# Patient Record
Sex: Male | Born: 1985
Health system: Southern US, Community
[De-identification: ages and names within clinical notes are randomized; demographics above are authoritative.]

## PROBLEM LIST (undated history)

## (undated) ENCOUNTER — Ambulatory Visit (HOSPITAL_COMMUNITY): Admission: EM | Payer: Medicare Other | Source: Home / Self Care

## (undated) DIAGNOSIS — R51 Headache: Secondary | ICD-10-CM

## (undated) DIAGNOSIS — D649 Anemia, unspecified: Secondary | ICD-10-CM

## (undated) DIAGNOSIS — I10 Essential (primary) hypertension: Secondary | ICD-10-CM

## (undated) DIAGNOSIS — N185 Chronic kidney disease, stage 5: Secondary | ICD-10-CM

## (undated) DIAGNOSIS — E875 Hyperkalemia: Secondary | ICD-10-CM

## (undated) DIAGNOSIS — Z992 Dependence on renal dialysis: Secondary | ICD-10-CM

## (undated) DIAGNOSIS — B019 Varicella without complication: Secondary | ICD-10-CM

## (undated) HISTORY — DX: Essential (primary) hypertension: I10

## (undated) HISTORY — DX: Headache: R51

## (undated) HISTORY — PX: PERITONEAL CATHETER INSERTION: SHX2223

## (undated) HISTORY — DX: Varicella without complication: B01.9

## (undated) HISTORY — DX: Anemia, unspecified: D64.9

## (undated) HISTORY — PX: DIALYSIS FISTULA CREATION: SHX611

## (undated) HISTORY — PX: KIDNEY TRANSPLANT: SHX239

---

## 2010-12-23 ENCOUNTER — Ambulatory Visit: Payer: Self-pay | Admitting: Family Medicine

## 2010-12-30 ENCOUNTER — Ambulatory Visit (INDEPENDENT_AMBULATORY_CARE_PROVIDER_SITE_OTHER): Payer: BC Managed Care – PPO | Admitting: Family Medicine

## 2010-12-30 ENCOUNTER — Encounter: Payer: Self-pay | Admitting: Family Medicine

## 2010-12-30 VITALS — BP 150/100 | HR 80 | Temp 98.4°F | Resp 12 | Ht 71.75 in | Wt 257.0 lb

## 2010-12-30 DIAGNOSIS — R319 Hematuria, unspecified: Secondary | ICD-10-CM

## 2010-12-30 DIAGNOSIS — G43909 Migraine, unspecified, not intractable, without status migrainosus: Secondary | ICD-10-CM

## 2010-12-30 DIAGNOSIS — Z0000 Encounter for general adult medical examination without abnormal findings: Secondary | ICD-10-CM

## 2010-12-30 DIAGNOSIS — Z23 Encounter for immunization: Secondary | ICD-10-CM

## 2010-12-30 DIAGNOSIS — I10 Essential (primary) hypertension: Secondary | ICD-10-CM | POA: Insufficient documentation

## 2010-12-30 DIAGNOSIS — Z299 Encounter for prophylactic measures, unspecified: Secondary | ICD-10-CM

## 2010-12-30 LAB — CBC WITH DIFFERENTIAL/PLATELET
Basophils Relative: 0.6 % (ref 0.0–3.0)
Eosinophils Absolute: 0.4 10*3/uL (ref 0.0–0.7)
Eosinophils Relative: 4.1 % (ref 0.0–5.0)
Hemoglobin: 15.4 g/dL (ref 13.0–17.0)
Lymphocytes Relative: 29.1 % (ref 12.0–46.0)
MCHC: 32.8 g/dL (ref 30.0–36.0)
MCV: 105 fl — ABNORMAL HIGH (ref 78.0–100.0)
Monocytes Absolute: 0.5 10*3/uL (ref 0.1–1.0)
Neutro Abs: 6.1 10*3/uL (ref 1.4–7.7)
Neutrophils Relative %: 61.2 % (ref 43.0–77.0)
RBC: 4.48 Mil/uL (ref 4.22–5.81)
WBC: 9.9 10*3/uL (ref 4.5–10.5)

## 2010-12-30 LAB — HEPATIC FUNCTION PANEL
ALT: 27 U/L (ref 0–53)
AST: 27 U/L (ref 0–37)
Bilirubin, Direct: 0 mg/dL (ref 0.0–0.3)
Total Bilirubin: 0.7 mg/dL (ref 0.3–1.2)
Total Protein: 7.5 g/dL (ref 6.0–8.3)

## 2010-12-30 LAB — POCT URINALYSIS DIPSTICK
Ketones, UA: NEGATIVE
Spec Grav, UA: 1.025
Urobilinogen, UA: 0.2
pH, UA: 6

## 2010-12-30 LAB — BASIC METABOLIC PANEL
BUN: 32 mg/dL — ABNORMAL HIGH (ref 6–23)
CO2: 25 mEq/L (ref 19–32)
Calcium: 9.3 mg/dL (ref 8.4–10.5)
GFR: 45.45 mL/min — ABNORMAL LOW (ref >60.00)
Glucose, Bld: 99 mg/dL (ref 70–99)

## 2010-12-30 LAB — LIPID PANEL: HDL: 43.3 mg/dL (ref >39.00)

## 2010-12-30 MED ORDER — LISINOPRIL-HYDROCHLOROTHIAZIDE 20-12.5 MG PO TABS: 1.0000 | ORAL_TABLET | Freq: Every day | ORAL | Status: DC

## 2010-12-30 NOTE — Patient Instructions (Signed)
Work on consistent aerobic exercise and weight loss Schedule followup in one month to reassess blood pressure

## 2010-12-31 ENCOUNTER — Encounter: Payer: Self-pay | Admitting: Family Medicine

## 2010-12-31 LAB — LDL CHOLESTEROL, DIRECT: Direct LDL: 174.3 mg/dL

## 2010-12-31 NOTE — Progress Notes (Signed)
  Subjective:    Patient ID: Cory Dixon, male    DOB: 08/05/1985, 25 y.o.   MRN: [id number]  HPI New patient to establish care for CPE.  PMH significant for elevated blood pressure currently not treated.  Has history of migraine headaches and takes OTC meds.  These occur somewhat infrequently-about once per month.  Ongoing nicotine use.  Has recently moved here from [place].  On no meds.  PMH as below.  Tdap 2009.  No flu vaccine yet.   Works as Firefighter.  Exercises couple times per week.  No recent labs.  Very strong FH hypertension.  Past Medical History  Diagnosis Date  . Chicken pox   . Headache   . Hypertension   . Migraine    History reviewed. No pertinent past surgical history.  reports that he has been smoking Cigarettes.  He has a 1.6 pack-year smoking history. He does not have any smokeless tobacco history on file. His alcohol and drug histories not on file. family history includes Arthritis in his maternal grandmother and mother; Cancer in his maternal grandmother; Depression in his maternal grandmother and paternal grandmother; and Hypertension in his maternal grandfather, maternal grandmother, paternal grandfather, and paternal grandmother. No Known Allergies    Review of Systems  Constitutional: Negative for fever, activity change, appetite change, fatigue and unexpected weight change.  HENT: Negative for hearing loss, sore throat, neck pain and voice change.   Eyes: Negative for visual disturbance.  Respiratory: Negative for cough and shortness of breath.   Cardiovascular: Negative for chest pain and palpitations.  Gastrointestinal: Negative for nausea, vomiting, abdominal pain, diarrhea, blood in stool and abdominal distention.  Genitourinary: Negative for dysuria and testicular pain.  Musculoskeletal: Negative for back pain.  Neurological: Negative for dizziness, seizures and syncope.  Hematological: Negative for adenopathy. Does not bruise/bleed  easily.  Psychiatric/Behavioral: Negative for dysphoric mood.       Objective:   Physical Exam  Constitutional: He is oriented to person, place, and time. He appears well-developed and well-nourished. No distress.  HENT:  Right Ear: External ear normal.  Left Ear: External ear normal.  Mouth/Throat: Oropharynx is clear and moist.  Eyes: Pupils are equal, round, and reactive to light.  Neck: Neck supple. No thyromegaly present.  Cardiovascular: Normal rate and regular rhythm.  Exam reveals no gallop.   Pulmonary/Chest: Effort normal and breath sounds normal. No respiratory distress. He has no wheezes. He has no rales.  Abdominal: Soft. Bowel sounds are normal. He exhibits no distension and no mass. There is no tenderness. There is no rebound and no guarding.  Genitourinary:       Testes normal.  No hernia.  Musculoskeletal: He exhibits no edema.  Lymphadenopathy:    He has no cervical adenopathy.  Neurological: He is alert and oriented to person, place, and time. No cranial nerve deficit.  Skin: No rash noted. No erythema.  Psychiatric: He has a normal mood and affect. His behavior is normal.          Assessment & Plan:  #1  Health Maintenance-  Flu vaccine given.  Needs to lose some weight.  Baseline labs. #2  Hypertension.  Currently untreated.  Lisinopril HCTZ 20/12.5 mg one daily.

## 2011-01-01 NOTE — Progress Notes (Signed)
Quick Note:  LMTCB to review labs and directions ______

## 2011-01-02 NOTE — Progress Notes (Signed)
Quick Note:  Pt informed, he will schedule return OV in 2 weeks ______

## 2011-01-08 ENCOUNTER — Telehealth: Payer: Self-pay | Admitting: *Deleted

## 2011-01-08 ENCOUNTER — Encounter: Payer: BC Managed Care – PPO | Admitting: Family Medicine

## 2011-01-08 NOTE — Telephone Encounter (Signed)
Pt no show for 1 month follow-up visit.  I called pt, left a message re no show and our no show policy.  Requested he call back to explain

## 2011-01-08 NOTE — Progress Notes (Signed)
No show for 1 month follow-up, VM left for pt to return call.

## 2011-01-22 ENCOUNTER — Ambulatory Visit: Payer: BC Managed Care – PPO | Admitting: Family Medicine

## 2011-05-29 ENCOUNTER — Telehealth: Payer: Self-pay | Admitting: Family Medicine

## 2011-05-29 DIAGNOSIS — I10 Essential (primary) hypertension: Secondary | ICD-10-CM

## 2011-05-29 MED ORDER — LISINOPRIL-HYDROCHLOROTHIAZIDE 20-12.5 MG PO TABS: 1.0000 | ORAL_TABLET | Freq: Every day | ORAL | Status: DC

## 2011-05-29 NOTE — Telephone Encounter (Signed)
Pt called and now lives in [place]. Pt will be getting a new pcp, but will need to get a new script for lisinopril-hydrochlorothiazide (PRINZIDE,ZESTORETIC) 20-12.5 MG per tablet called in to [facility] in [place], [place]. Pt only has one pill left.

## 2011-11-30 NOTE — Progress Notes (Signed)
This encounter was created in error - please disregard.

## 2016-12-29 DIAGNOSIS — R0789 Other chest pain: Secondary | ICD-10-CM | POA: Diagnosis not present

## 2016-12-29 DIAGNOSIS — M545 Low back pain: Secondary | ICD-10-CM | POA: Diagnosis not present

## 2016-12-29 DIAGNOSIS — R51 Headache: Secondary | ICD-10-CM | POA: Diagnosis not present

## 2016-12-29 DIAGNOSIS — Z792 Long term (current) use of antibiotics: Secondary | ICD-10-CM | POA: Diagnosis not present

## 2016-12-29 DIAGNOSIS — Z992 Dependence on renal dialysis: Secondary | ICD-10-CM | POA: Diagnosis not present

## 2016-12-29 DIAGNOSIS — Z79899 Other long term (current) drug therapy: Secondary | ICD-10-CM | POA: Diagnosis not present

## 2016-12-29 DIAGNOSIS — Z79811 Long term (current) use of aromatase inhibitors: Secondary | ICD-10-CM | POA: Diagnosis not present

## 2016-12-29 DIAGNOSIS — N186 End stage renal disease: Secondary | ICD-10-CM | POA: Diagnosis not present

## 2016-12-29 DIAGNOSIS — Z888 Allergy status to other drugs, medicaments and biological substances status: Secondary | ICD-10-CM | POA: Diagnosis not present

## 2016-12-29 DIAGNOSIS — Z9889 Other specified postprocedural states: Secondary | ICD-10-CM | POA: Diagnosis not present

## 2016-12-29 DIAGNOSIS — I120 Hypertensive chronic kidney disease with stage 5 chronic kidney disease or end stage renal disease: Secondary | ICD-10-CM | POA: Diagnosis not present

## 2017-03-07 DIAGNOSIS — N2581 Secondary hyperparathyroidism of renal origin: Secondary | ICD-10-CM | POA: Diagnosis not present

## 2017-03-07 DIAGNOSIS — N186 End stage renal disease: Secondary | ICD-10-CM | POA: Diagnosis not present

## 2017-03-07 DIAGNOSIS — N250 Renal osteodystrophy: Secondary | ICD-10-CM | POA: Diagnosis not present

## 2017-03-07 DIAGNOSIS — E875 Hyperkalemia: Secondary | ICD-10-CM

## 2017-03-07 HISTORY — DX: Hyperkalemia: E87.5

## 2017-03-11 ENCOUNTER — Encounter (HOSPITAL_COMMUNITY): Payer: Self-pay | Admitting: Emergency Medicine

## 2017-03-11 ENCOUNTER — Other Ambulatory Visit: Payer: Self-pay

## 2017-03-11 ENCOUNTER — Emergency Department (HOSPITAL_COMMUNITY)
Admission: EM | Admit: 2017-03-11 | Discharge: 2017-03-11 | Disposition: A | Discharge status: Home / Self Care | Payer: Medicare Other | Attending: Emergency Medicine | Admitting: Emergency Medicine

## 2017-03-11 DIAGNOSIS — N186 End stage renal disease: Secondary | ICD-10-CM

## 2017-03-11 DIAGNOSIS — Z992 Dependence on renal dialysis: Secondary | ICD-10-CM | POA: Insufficient documentation

## 2017-03-11 DIAGNOSIS — Z79899 Other long term (current) drug therapy: Secondary | ICD-10-CM | POA: Diagnosis not present

## 2017-03-11 DIAGNOSIS — I120 Hypertensive chronic kidney disease with stage 5 chronic kidney disease or end stage renal disease: Secondary | ICD-10-CM | POA: Insufficient documentation

## 2017-03-11 DIAGNOSIS — F17210 Nicotine dependence, cigarettes, uncomplicated: Secondary | ICD-10-CM | POA: Insufficient documentation

## 2017-03-11 LAB — COMPREHENSIVE METABOLIC PANEL
ALT: 14 U/L — ABNORMAL LOW (ref 17–63)
ANION GAP: 14 (ref 5–15)
AST: 16 U/L (ref 15–41)
Albumin: 3.9 g/dL (ref 3.5–5.0)
Alkaline Phosphatase: 68 U/L (ref 38–126)
BILIRUBIN TOTAL: 1 mg/dL (ref 0.3–1.2)
BUN: 59 mg/dL — AB (ref 6–20)
CO2: 26 mmol/L (ref 22–32)
Calcium: 9.6 mg/dL (ref 8.9–10.3)
Chloride: 98 mmol/L — ABNORMAL LOW (ref 101–111)
Creatinine, Ser: 11.88 mg/dL — ABNORMAL HIGH (ref 0.61–1.24)
GFR calc Af Amer: 6 mL/min — ABNORMAL LOW (ref >60)
GFR, EST NON AFRICAN AMERICAN: 5 mL/min — AB (ref >60)
Glucose, Bld: 95 mg/dL (ref 65–99)
POTASSIUM: 4.6 mmol/L (ref 3.5–5.1)
Sodium: 138 mmol/L (ref 135–145)
TOTAL PROTEIN: 7.5 g/dL (ref 6.5–8.1)

## 2017-03-11 LAB — CBC WITH DIFFERENTIAL/PLATELET
BASOS ABS: 0.1 10*3/uL (ref 0.0–0.1)
BASOS PCT: 1 %
EOS PCT: 3 %
Eosinophils Absolute: 0.3 10*3/uL (ref 0.0–0.7)
HCT: 37.6 % — ABNORMAL LOW (ref 39.0–52.0)
Hemoglobin: 12.7 g/dL — ABNORMAL LOW (ref 13.0–17.0)
LYMPHS PCT: 35 %
Lymphs Abs: 3.5 10*3/uL (ref 0.7–4.0)
MCH: 36.1 pg — ABNORMAL HIGH (ref 26.0–34.0)
MCHC: 33.8 g/dL (ref 30.0–36.0)
MCV: 106.8 fL — AB (ref 78.0–100.0)
Monocytes Absolute: 0.4 10*3/uL (ref 0.1–1.0)
Monocytes Relative: 4 %
NEUTROS ABS: 5.8 10*3/uL (ref 1.7–7.7)
Neutrophils Relative %: 57 %
Platelets: 236 10*3/uL (ref 150–400)
RBC: 3.52 MIL/uL — AB (ref 4.22–5.81)
RDW: 13.6 % (ref 11.5–15.5)
WBC: 10.1 10*3/uL (ref 4.0–10.5)

## 2017-03-11 NOTE — ED Notes (Signed)
Pt verbalized understanding of discharge instructions. Unable to sign. Hall bed.

## 2017-03-11 NOTE — ED Provider Notes (Signed)
[facility] EMERGENCY DEPARTMENT Provider Note   CSN: [id number] Arrival date & time: 03/11/17  0745  History   Chief Complaint No chief complaint on file.   HPI Cory Dixon is a 31 y.o. male with a PMHx of HTN, A-fib (one episode in 2015, sinus since), and ESRD on HD TRS who presented to the ED for HD. He recently moved from [place] (Dr. Thera Flake) and his last HD was on 12/01. He has been on HD for approximately 1 year and has a fistula at his left anterior cubital fossa. He is unsure of the cause of his kidney failure and has never had a kidney biopsy. He still produces a good amount of urine and is in the process of getting outpatient HD set up but he is unsure which center. He currently is denying symptoms of volume overload or uremia.   Past Medical History:  Diagnosis Date  . Chicken pox   . Headache(784.0)   . Hypertension   . Migraine    Patient Active Problem List   Diagnosis Date Noted  . Hypertension 12/30/2010  . Migraine headache 12/30/2010   History reviewed. No pertinent surgical history.  Home Medications    Prior to Admission medications   Medication Sig Start Date End Date Taking? Authorizing Provider  cinacalcet (SENSIPAR) 60 MG tablet Take 60 mg by mouth daily.   Yes [provider]  escitalopram (LEXAPRO) 5 MG tablet Take 5 mg by mouth at bedtime.   Yes [provider]  lisinopril (PRINIVIL,ZESTRIL) 10 MG tablet Take 10 mg by mouth daily.   Yes [provider]  nicotine (NICODERM CQ - DOSED IN MG/24 HOURS) 14 mg/24hr patch Place 14 mg onto the skin daily as needed (nicotine).   Yes [provider]  sucroferric oxyhydroxide (VELPHORO) 500 MG chewable tablet Chew 1,000 mg by mouth 3 (three) times daily.   Yes [provider]  tamsulosin (FLOMAX) 0.4 MG CAPS capsule Take 0.4 mg by mouth daily.   Yes [provider]  lisinopril-hydrochlorothiazide (PRINZIDE,ZESTORETIC) 20-12.5 MG per tablet  Take 1 tablet by mouth daily. Patient not taking: Reported on 03/11/2017 05/29/11 05/28/12  [name], MD   Family History Family History  Problem Relation Age of Onset  . Arthritis Mother   . Arthritis Maternal Grandmother   . Cancer Maternal Grandmother        breast  . Hypertension Maternal Grandmother   . Depression Maternal Grandmother   . Hypertension Maternal Grandfather   . Hypertension Paternal Grandmother   . Depression Paternal Grandmother   . Hypertension Paternal Grandfather    Social History Social History   Tobacco Use  . Smoking status: Current Every Day Smoker    Packs/day: 0.20    Years: 8.00    Pack years: 1.60    Types: Cigarettes  . Smokeless tobacco: Never Used  Substance Use Topics  . Alcohol use: Not on file  . Drug use: Not on file   Allergies   Nicotine and Percocet [oxycodone-acetaminophen]  Review of Systems  All systems reviewed and are negative for acute change except as noted in the HPI.  Physical Exam Updated Vital Signs BP 121/73 (BP Location: Right Arm)   Pulse 68   Temp (!) 97.5 F (36.4 C) (Oral)   Resp 17   SpO2 100%   General: Well-nourished male in no acute distress HENT: Normocephalic, atraumatic, moist mucus membranes  Pulm: Good air movement with no wheezing or crackles  CV: RRR,  no murmurs, no rubs  Abdomen: Active bowel sounds, soft, non-distended, no tenderness to palpation  Extremities: No LE edema, bruit felt in the left anterior cubital fossa. Skin: Dry and warm with no rashes Neuro: Alert and oriented x 3  ED Treatments / Results  Labs (all labs ordered are listed, but only abnormal results are displayed) Labs Reviewed  COMPREHENSIVE METABOLIC PANEL - Abnormal; Notable for the following components:      Result Value   Chloride 98 (*)    BUN 59 (*)    Creatinine, Ser 11.88 (*)    ALT 14 (*)    GFR calc non Af Amer 5 (*)    GFR calc Af Amer 6 (*)    All other components within normal limits  CBC  WITH DIFFERENTIAL/PLATELET - Abnormal; Notable for the following components:   RBC 3.52 (*)    Hemoglobin 12.7 (*)    HCT 37.6 (*)    MCV 106.8 (*)    MCH 36.1 (*)    All other components within normal limits   EKG  EKG Interpretation None      Radiology No results found.  Procedures Procedures (including critical care time)  Medications Ordered in ED Medications - No data to display  Initial Impression / Assessment and Plan / ED Course  I have reviewed the triage vital signs and the nursing notes.  Pertinent labs & imaging results that were available during my care of the patient were reviewed by me and considered in my medical decision making (see chart for details).    31 y.o male with a PMHx significant for ESRD on HD TRS who presented to the ED after not receiving HD since 12/01. He is currently euvolemic with no signs of symptoms of volume overload. He denies uremic symptoms. Pertinent labs include; K 4.6, CO2 26, BUN 59, Ca 9.6, Hgb 12.7. He currently has no signs or symptoms to suggest he need emergent HD. He still produces urine.   Spoke with nephrology who spoke with the social worker in [place]. Patient should be set up for outpatient HD by 12/07 at the latest. Do not think the patient needs to be admitted at this point and there are no indications for HD. I discussed this with the patient who agrees with the plan. We discussed return precautions. Patient stable for discharge home.   Final Clinical Impressions(s) / ED Diagnoses   Final diagnoses:  ESRD (end stage renal disease) on dialysis [facility])   ED Discharge Orders    None       Ina Homes, MD 03/11/17 [place], [place], DO 03/12/17 1615

## 2017-03-11 NOTE — ED Triage Notes (Signed)
Pt states recently moved here from [place] on Saturday. Does not have a dialysis center here yet, was told to come to the hospital for treatments. States is T/TH/S, last treatment Saturday prior to moving. Pt is in NAD. Respirations equal and unlabored VSS. Fistula in left arm.

## 2017-03-11 NOTE — Discharge Instructions (Signed)
Please return if you have not heard about HD on Friday.   Please return if you begin to experience: - Extreme tiredness or fatigue  - Cramping  - Little appetite  - Persistent headaches  - Nausea and/or vomiting  - Difficulty concentrating

## 2017-03-19 ENCOUNTER — Inpatient Hospital Stay (HOSPITAL_COMMUNITY)
Admission: EM | Admit: 2017-03-19 | Discharge: 2017-03-20 | DRG: 640 | Disposition: A | Discharge status: Home / Self Care | Payer: Medicare Other | Attending: Family Medicine | Admitting: Family Medicine

## 2017-03-19 ENCOUNTER — Encounter (HOSPITAL_COMMUNITY): Payer: Self-pay | Admitting: Emergency Medicine

## 2017-03-19 ENCOUNTER — Inpatient Hospital Stay (HOSPITAL_COMMUNITY): Payer: Medicare Other

## 2017-03-19 ENCOUNTER — Other Ambulatory Visit: Payer: Self-pay

## 2017-03-19 DIAGNOSIS — N186 End stage renal disease: Secondary | ICD-10-CM | POA: Diagnosis present

## 2017-03-19 DIAGNOSIS — Z79899 Other long term (current) drug therapy: Secondary | ICD-10-CM | POA: Diagnosis not present

## 2017-03-19 DIAGNOSIS — D649 Anemia, unspecified: Secondary | ICD-10-CM | POA: Diagnosis present

## 2017-03-19 DIAGNOSIS — R9431 Abnormal electrocardiogram [ECG] [EKG]: Secondary | ICD-10-CM | POA: Diagnosis present

## 2017-03-19 DIAGNOSIS — E8889 Other specified metabolic disorders: Secondary | ICD-10-CM | POA: Diagnosis present

## 2017-03-19 DIAGNOSIS — Z992 Dependence on renal dialysis: Secondary | ICD-10-CM | POA: Diagnosis not present

## 2017-03-19 DIAGNOSIS — Z885 Allergy status to narcotic agent status: Secondary | ICD-10-CM | POA: Diagnosis not present

## 2017-03-19 DIAGNOSIS — Z888 Allergy status to other drugs, medicaments and biological substances status: Secondary | ICD-10-CM

## 2017-03-19 DIAGNOSIS — N2581 Secondary hyperparathyroidism of renal origin: Secondary | ICD-10-CM | POA: Diagnosis present

## 2017-03-19 DIAGNOSIS — E875 Hyperkalemia: Secondary | ICD-10-CM | POA: Diagnosis not present

## 2017-03-19 DIAGNOSIS — R0602 Shortness of breath: Secondary | ICD-10-CM | POA: Diagnosis not present

## 2017-03-19 DIAGNOSIS — F329 Major depressive disorder, single episode, unspecified: Secondary | ICD-10-CM | POA: Diagnosis present

## 2017-03-19 DIAGNOSIS — Z590 Homelessness: Secondary | ICD-10-CM

## 2017-03-19 DIAGNOSIS — Z9115 Patient's noncompliance with renal dialysis: Secondary | ICD-10-CM

## 2017-03-19 DIAGNOSIS — D631 Anemia in chronic kidney disease: Secondary | ICD-10-CM | POA: Diagnosis not present

## 2017-03-19 DIAGNOSIS — R531 Weakness: Secondary | ICD-10-CM | POA: Diagnosis present

## 2017-03-19 DIAGNOSIS — F17210 Nicotine dependence, cigarettes, uncomplicated: Secondary | ICD-10-CM | POA: Diagnosis present

## 2017-03-19 DIAGNOSIS — I10 Essential (primary) hypertension: Secondary | ICD-10-CM | POA: Diagnosis present

## 2017-03-19 DIAGNOSIS — I120 Hypertensive chronic kidney disease with stage 5 chronic kidney disease or end stage renal disease: Secondary | ICD-10-CM | POA: Diagnosis present

## 2017-03-19 HISTORY — DX: Hyperkalemia: E87.5

## 2017-03-19 HISTORY — DX: Chronic kidney disease, stage 5: N18.5

## 2017-03-19 LAB — CBC WITH DIFFERENTIAL/PLATELET
Basophils Absolute: 0.1 10*3/uL (ref 0.0–0.1)
Basophils Relative: 1 %
EOS ABS: 0.3 10*3/uL (ref 0.0–0.7)
EOS PCT: 3 %
HCT: 37.2 % — ABNORMAL LOW (ref 39.0–52.0)
Hemoglobin: 12.2 g/dL — ABNORMAL LOW (ref 13.0–17.0)
LYMPHS ABS: 3.1 10*3/uL (ref 0.7–4.0)
Lymphocytes Relative: 35 %
MCH: 35.3 pg — AB (ref 26.0–34.0)
MCHC: 32.8 g/dL (ref 30.0–36.0)
MCV: 107.5 fL — ABNORMAL HIGH (ref 78.0–100.0)
MONO ABS: 0.4 10*3/uL (ref 0.1–1.0)
MONOS PCT: 5 %
Neutro Abs: 5.1 10*3/uL (ref 1.7–7.7)
Neutrophils Relative %: 56 %
PLATELETS: 243 10*3/uL (ref 150–400)
RBC: 3.46 MIL/uL — ABNORMAL LOW (ref 4.22–5.81)
RDW: 13.6 % (ref 11.5–15.5)
WBC: 8.9 10*3/uL (ref 4.0–10.5)

## 2017-03-19 LAB — POCT I-STAT, CHEM 8
BUN: 32 mg/dL — ABNORMAL HIGH (ref 6–20)
CALCIUM ION: 1.09 mmol/L — AB (ref 1.15–1.40)
Chloride: 101 mmol/L (ref 101–111)
Creatinine, Ser: 6.5 mg/dL — ABNORMAL HIGH (ref 0.61–1.24)
Glucose, Bld: 97 mg/dL (ref 65–99)
HCT: 40 % (ref 39.0–52.0)
HEMOGLOBIN: 13.6 g/dL (ref 13.0–17.0)
Potassium: 3.5 mmol/L (ref 3.5–5.1)
SODIUM: 139 mmol/L (ref 135–145)
TCO2: 23 mmol/L (ref 22–32)

## 2017-03-19 LAB — I-STAT CHEM 8, ED
BUN: 75 mg/dL — AB (ref 6–20)
CALCIUM ION: 1.29 mmol/L (ref 1.15–1.40)
CHLORIDE: 117 mmol/L — AB (ref 101–111)
Creatinine, Ser: 13.7 mg/dL — ABNORMAL HIGH (ref 0.61–1.24)
GLUCOSE: 80 mg/dL (ref 65–99)
HCT: 38 % — ABNORMAL LOW (ref 39.0–52.0)
Hemoglobin: 12.9 g/dL — ABNORMAL LOW (ref 13.0–17.0)
Potassium: 8.1 mmol/L (ref 3.5–5.1)
SODIUM: 140 mmol/L (ref 135–145)
TCO2: 19 mmol/L — ABNORMAL LOW (ref 22–32)

## 2017-03-19 LAB — COMPREHENSIVE METABOLIC PANEL
ALT: 30 U/L (ref 17–63)
ANION GAP: 7 (ref 5–15)
AST: 22 U/L (ref 15–41)
Albumin: 4 g/dL (ref 3.5–5.0)
Alkaline Phosphatase: 71 U/L (ref 38–126)
BUN: 78 mg/dL — ABNORMAL HIGH (ref 6–20)
CHLORIDE: 112 mmol/L — AB (ref 101–111)
CO2: 19 mmol/L — AB (ref 22–32)
Calcium: 9.8 mg/dL (ref 8.9–10.3)
Creatinine, Ser: 14.12 mg/dL — ABNORMAL HIGH (ref 0.61–1.24)
GFR calc non Af Amer: 4 mL/min — ABNORMAL LOW (ref >60)
GFR, EST AFRICAN AMERICAN: 5 mL/min — AB (ref >60)
Glucose, Bld: 85 mg/dL (ref 65–99)
Potassium: >7.5 mmol/L (ref 3.5–5.1)
SODIUM: 138 mmol/L (ref 135–145)
Total Bilirubin: 0.7 mg/dL (ref 0.3–1.2)
Total Protein: 7.2 g/dL (ref 6.5–8.1)

## 2017-03-19 LAB — MRSA PCR SCREENING: MRSA BY PCR: NEGATIVE

## 2017-03-19 MED ORDER — RENA-VITE PO TABS
1.0000 | ORAL_TABLET | Freq: Every day | ORAL | Status: DC
  Administered 2017-03-19: 1 via ORAL
  Filled 2017-03-19: qty 1

## 2017-03-19 MED ORDER — SODIUM CHLORIDE 0.9% FLUSH
3.0000 mL | Freq: Two times a day (BID) | INTRAVENOUS | Status: DC
  Administered 2017-03-19: 3 mL via INTRAVENOUS

## 2017-03-19 MED ORDER — ESCITALOPRAM OXALATE 10 MG PO TABS: 5.0000 mg | ORAL_TABLET | Freq: Every day | ORAL | Status: DC

## 2017-03-19 MED ORDER — ACETAMINOPHEN 325 MG PO TABS
650.0000 mg | ORAL_TABLET | ORAL | Status: DC | PRN
  Administered 2017-03-19 – 2017-03-20 (×2): 650 mg via ORAL
  Filled 2017-03-19 (×2): qty 2

## 2017-03-19 MED ORDER — ALBUTEROL SULFATE (2.5 MG/3ML) 0.083% IN NEBU
10.0000 mg | INHALATION_SOLUTION | Freq: Once | RESPIRATORY_TRACT | Status: AC
Start: 2017-03-19 — End: 2017-03-19
  Administered 2017-03-19: 10 mg via RESPIRATORY_TRACT
  Filled 2017-03-19: qty 12

## 2017-03-19 MED ORDER — LISINOPRIL 10 MG PO TABS: 10.0000 mg | ORAL_TABLET | Freq: Every day | ORAL | Status: DC

## 2017-03-19 MED ORDER — INSULIN ASPART 100 UNIT/ML ~~LOC~~ SOLN
10.0000 [IU] | Freq: Once | SUBCUTANEOUS | Status: AC
  Administered 2017-03-19: 10 [IU] via INTRAVENOUS
  Filled 2017-03-19: qty 1

## 2017-03-19 MED ORDER — DEXTROSE 50 % IV SOLN
1.0000 | Freq: Once | INTRAVENOUS | Status: AC
  Administered 2017-03-19: 50 mL via INTRAVENOUS
  Filled 2017-03-19: qty 50

## 2017-03-19 MED ORDER — INSULIN ASPART 100 UNIT/ML IV SOLN: 10.0000 [IU] | Freq: Once | INTRAVENOUS | Status: DC

## 2017-03-19 MED ORDER — CINACALCET HCL 30 MG PO TABS: 60.0000 mg | ORAL_TABLET | Freq: Every day | ORAL | Status: DC

## 2017-03-19 MED ORDER — CALCIUM GLUCONATE 10 % IV SOLN
1.0000 g | Freq: Once | INTRAVENOUS | Status: AC
  Administered 2017-03-19: 1 g via INTRAVENOUS
  Filled 2017-03-19: qty 10

## 2017-03-19 MED ORDER — HEPARIN SODIUM (PORCINE) 5000 UNIT/ML IJ SOLN: 5000.0000 [IU] | Freq: Three times a day (TID) | INTRAMUSCULAR | Status: DC

## 2017-03-19 MED ORDER — HYDRALAZINE HCL 20 MG/ML IJ SOLN: 10.0000 mg | Freq: Four times a day (QID) | INTRAMUSCULAR | Status: DC | PRN

## 2017-03-19 MED ORDER — SUCROFERRIC OXYHYDROXIDE 500 MG PO CHEW
1000.0000 mg | CHEWABLE_TABLET | Freq: Three times a day (TID) | ORAL | Status: DC
  Administered 2017-03-19 – 2017-03-20 (×2): 1000 mg via ORAL
  Filled 2017-03-19 (×3): qty 2

## 2017-03-19 NOTE — H&P (Signed)
History and Physical    Cory Dixon [id number] DOB: March 30, 1986 DOA: 03/19/2017   PCP: [name], MD    Attending physician: Denton Brick  Patient coming from/Resides with: Private residence  Chief Complaint: Request dialysis tx, generalized weakness  HPI: Cory Dixon is a 31 y.o. male with medical history significant for chronic kidney disease on dialysis and hypertension.  Patient has relocated to the [place], [place] area in the past 2 weeks.  He has followed up with his PCP.  He has also attempted to arrange outpatient hemodialysis but has been unsuccessful.  He previously dialyzed with [facility] in [place].  Attempts were made to place him in dialysis through [facility] in [place] but were also unsuccessful.  Patient reports that he was informed that he had to have at least 2 inpatient hospital dialysis treatments as well as have had his hepatology serologies completed before he could proceed with outpatient dialysis.  Patient reports that he is currently living in a motel and is waiting for his new apartment in [place] to be inspected so he can move in.  The apartment is located on [address].  Patient is also in the process of having his Medicare adjusted to his new [facility] residency.  He will not be eligible for Medicaid in [place] until he has resided here for 30 days.  In the ER patient was noted with generalized weakness, BUN 75, creatinine 13.7, potassium 8.1, abnormal EKG with peaked T waves consistent with acute hyperkalemia.  EDP has given the patient albuterol neb, NovoLog insulin 10 units IV x1, 1 amp of D50 and an amp of calcium gluconate for cardioprotective effects.  Nephrology has seen patient and emergent hemodialysis has been requested.  Of note by the time I examined and interviewed the patient the dialysis unit was called for the patient to be transported to begin the dialysis treatment.  Patient also noted increased shortness  of breath-dyspnea on exertion as well as orthopnea.  No increase in lower extremity edema.  In the past 24 hours he is also noticed stools have been more loose and more frequent.  ED Course:  Vital Signs: BP 139/80   Pulse 72   Temp 98 F (36.7 C) (Oral)   Resp 18   Ht 6' (1.829 m)   Wt 115.7 kg (255 lb)   SpO2 100%   BMI 34.58 kg/m  Chest x-ray: Ordered by EDP but not yet completed Lab data: Sodium 140, potassium 8.1, chloride 117, CO2 19, glucose 80, BUN 75, creatinine 13.7, ionized calcium 1.29, LFTs not elevated, white count 8900 with normal differential, hemoglobin 12.2, MCV 108, 243,000 Medications and treatments: NovoLog insulin 10 units IV x1, 1 amp of D50, 1 amp of calcium gluconate, albuterol neb 10 mg x1  Review of Systems:  In addition to the HPI above,  No Fever-chills, myalgias or other constitutional symptoms No Headache, changes with Vision or hearing, new weakness, tingling, numbness in any extremity, dizziness, dysarthria or word finding difficulty, gait disturbance or imbalance, tremors or seizure activity No problems swallowing food or Liquids, indigestion/reflux, choking or coughing while eating, abdominal pain with or after eating No Chest pain, Cough, palpitations No Abdominal pain, N/V, melena,hematochezia, dark tarry stools, constipation No dysuria, malodorous urine, hematuria or flank pain No new skin rashes, lesions, masses or bruises, No new joint pains, swelling or redness No recent unintentional weight gain or loss No polyuria, polydypsia or polyphagia   Past Medical History:  Diagnosis Date  .  Chicken pox   . Headache(784.0)   . Hypertension   . Migraine     No past surgical history on file.  Social History   Socioeconomic History  . Marital status: Single    Spouse name: Not on file  . Number of children: Not on file  . Years of education: Not on file  . Highest education level: Not on file  Social Needs  . Financial resource strain:  Not on file  . Food insecurity - worry: Not on file  . Food insecurity - inability: Not on file  . Transportation needs - medical: Not on file  . Transportation needs - non-medical: Not on file  Occupational History  . Not on file  Tobacco Use  . Smoking status: Current Every Day Smoker    Packs/day: 0.20    Years: 8.00    Pack years: 1.60    Types: Cigarettes  . Smokeless tobacco: Never Used  Substance and Sexual Activity  . Alcohol use: Not on file  . Drug use: Not on file  . Sexual activity: Not on file  Other Topics Concern  . Not on file  Social History Narrative  . Not on file    Mobility: Independent Work history: Disabled   Allergies  Allergen Reactions  . Nicotine Other (See Comments)    Experiences nightmare  . Percocet [Oxycodone-Acetaminophen] Itching and Other (See Comments)    constipation    Family History  Problem Relation Age of Onset  . Arthritis Mother   . Arthritis Maternal Grandmother   . Cancer Maternal Grandmother        breast  . Hypertension Maternal Grandmother   . Depression Maternal Grandmother   . Hypertension Maternal Grandfather   . Hypertension Paternal Grandmother   . Depression Paternal Grandmother   . Hypertension Paternal Grandfather      Prior to Admission medications   Medication Sig Start Date End Date Taking? Authorizing Provider  cinacalcet (SENSIPAR) 60 MG tablet Take 60 mg by mouth daily.   Yes [provider]  escitalopram (LEXAPRO) 5 MG tablet Take 5 mg by mouth at bedtime.   Yes [provider]  lisinopril (PRINIVIL,ZESTRIL) 10 MG tablet Take 10 mg by mouth daily.   Yes [provider]  sucroferric oxyhydroxide (VELPHORO) 500 MG chewable tablet Chew 1,000 mg by mouth 3 (three) times daily.   Yes [provider]  tamsulosin (FLOMAX) 0.4 MG CAPS capsule Take 0.4 mg by mouth daily.   Yes [provider]  lisinopril-hydrochlorothiazide (PRINZIDE,ZESTORETIC) 20-12.5 MG per  tablet Take 1 tablet by mouth daily. Patient not taking: Reported on 03/11/2017 05/29/11 05/28/12  [name], MD    Physical Exam: Vitals:   03/19/17 1237 03/19/17 1240 03/19/17 1245 03/19/17 1254  BP: (!) 152/86  139/80   Pulse:  (!) 53 (!) 59 72  Resp: 20 12 13 18   Temp:      TempSrc:      SpO2:  99% 98% 100%  Weight:      Height:          Constitutional: NAD, calm, comfortable Eyes: PERRL, lids and conjunctivae normal ENMT: Mucous membranes are moist. Posterior pharynx clear of any exudate or lesions.Normal dentition.  Neck: normal, supple, no masses, no thyromegaly Respiratory: clear to auscultation bilaterally, no wheezing; a few bibasilar expiratory crackles. Normal respiratory effort. No accessory muscle use. RA  Cardiovascular: Regular rate and rhythm, no murmurs / rubs / gallops. No extremity edema. 2+ pedal pulses.  No carotid bruits.  Abdomen: no tenderness, no masses palpated. No hepatosplenomegaly. Bowel sounds positive.  Musculoskeletal: no clubbing / cyanosis. No joint deformity upper and lower extremities. Good ROM, no contractures. Normal muscle tone.  Skin: no rashes, lesions, ulcers. No induration-skin is very dry Neurologic: CN 2-12 grossly intact. Sensation intact, DTR normal. Strength 5/5 x all 4 extremities.  Psychiatric: Normal judgment and insight. Alert and oriented x 3. Normal mood.    Labs on Admission: I have personally reviewed following labs and imaging studies  CBC: Recent Labs  Lab 03/19/17 1124 03/19/17 1142  WBC 8.9  --   NEUTROABS 5.1  --   HGB 12.2* 12.9*  HCT 37.2* 38.0*  MCV 107.5*  --   PLT 243  --    Basic Metabolic Panel: Recent Labs  Lab 03/19/17 1124 03/19/17 1142  NA 138 140  K >7.5* 8.1*  CL 112* 117*  CO2 19*  --   GLUCOSE 85 80  BUN 78* 75*  CREATININE 14.12* 13.70*  CALCIUM 9.8  --    GFR: Estimated Creatinine Clearance: 10.2 mL/min (A) (by C-G formula based on SCr of 13.7 mg/dL (H)). Liver Function  Tests: Recent Labs  Lab 03/19/17 1124  AST 22  ALT 30  ALKPHOS 71  BILITOT 0.7  PROT 7.2  ALBUMIN 4.0   No results for input(s): LIPASE, AMYLASE in the last 168 hours. No results for input(s): AMMONIA in the last 168 hours. Coagulation Profile: No results for input(s): INR, PROTIME in the last 168 hours. Cardiac Enzymes: No results for input(s): CKTOTAL, CKMB, CKMBINDEX, TROPONINI in the last 168 hours. BNP (last 3 results) No results for input(s): PROBNP in the last 8760 hours. HbA1C: No results for input(s): HGBA1C in the last 72 hours. CBG: No results for input(s): GLUCAP in the last 168 hours. Lipid Profile: No results for input(s): CHOL, HDL, LDLCALC, TRIG, CHOLHDL, LDLDIRECT in the last 72 hours. Thyroid Function Tests: No results for input(s): TSH, T4TOTAL, FREET4, T3FREE, THYROIDAB in the last 72 hours. Anemia Panel: No results for input(s): VITAMINB12, FOLATE, FERRITIN, TIBC, IRON, RETICCTPCT in the last 72 hours. Urine analysis:    Component Value Date/Time   BILIRUBINUR neg 12/30/2010 1151   PROTEINUR ++++ 12/30/2010 1151   UROBILINOGEN 0.2 12/30/2010 1151   NITRITE neg 12/30/2010 1151   LEUKOCYTESUR neg 12/30/2010 1151   Sepsis Labs: @LABRCNTIP (procalcitonin:4,lacticidven:4) )No results found for this or any previous visit (from the past 240 hour(s)).   Radiological Exams on Admission: No results found.  EKG: (Independently reviewed) sinus rhythm with borderline bradycardic rate 59 bpm, QTC 433 ms, normal R wave rotation, marked peak T wave elevation in leads II, III, aVF, V 3 through V6  Assessment/Plan Principal Problem:   Acute hyperkalemia/ Abnormal EKG -Presents with symptomatic hyperkalemia and abnormal EKG in the context of missed hemodialysis -Continue telemetry monitoring -Treat underlying causes: Emergent hemodialysis -Previously given cardioprotective medications by EDP -Follow labs  Active Problems:   CKD (chronic kidney disease) stage  V requiring chronic dialysis  -Patient has missed hemodialysis secondary to inability to obtain outpatient dialysis center appointment -SW consulted to assist with CLIP procedure -Patient planning to reside in [place] on [place] nephrology assistance and speed at which emergent hemodialysis session has been arranged -Management of metabolic bone disease per nephrology    HTN (hypertension) -Resume preadmission lisinopril    Depression -Continue preadmission Lexapro      DVT prophylaxis: SQ heparin Code Status: Full Family Communication: No family at  bedside Disposition Plan: Home Consults called: Nephrology/[place]    Cintya Daughety L. ANP-BC [facility] Pager [phone]   If 7PM-7AM, please contact night-coverage [email or web address] Password [email or web address]  03/19/2017, 1:06 PM

## 2017-03-19 NOTE — Procedures (Signed)
Patient seen and examined on Hemodialysis. QB 400 mL/ min, UF goal 4L.  K 8.1, 1K bath 1.5 hrs and then 2k for remainder of 3.5 hr treatment.  Treatment adjusted as needed.  Madelon Lips MD [facility] pgr [phone]  2:22 PM

## 2017-03-19 NOTE — Consult Note (Signed)
[facility] Renal Consultation Note    Indication for Consultation:  Management of ESRD/hemodialysis; anemia, hypertension/volume and secondary hyperparathyroidism PCP: none  HPI: Cory Dixon is a 31 y.o. male with ESRD from [place] who previously dialyzed with [facility].  His last HD was 12/5.  He moved here without arrangements for outpatient HD. He said [facility] was taking too long so he was working on getting in with [facility].  He is staying at a motel in [place] on [address]  while his apartment in [place] on [address] is getting inspected. He moved her to be closer to family and because the cost of living is less.  He is on lisinopril for BP and takes 2 velphoro as his P binder plus 60 sensipar q day.  According to the ED note 12/5 he said his last HD was Saturday (which would have been 12/1).  Labs 12/5 showed a K of 4.6. Today K is > 7.5 with peaked [place].  He is not having chest pain but is SOB.  He came to the ED today because he was feeling funny and having some trouble walking. He has some DOE and trouble sleeping due to SOB.     Past Medical History:  Diagnosis Date  . Chicken pox   . Headache(784.0)   . Hypertension   . Migraine    No past surgical history on file. Family History  Problem Relation Age of Onset  . Arthritis Mother   . Arthritis Maternal Grandmother   . Cancer Maternal Grandmother        breast  . Hypertension Maternal Grandmother   . Depression Maternal Grandmother   . Hypertension Maternal Grandfather   . Hypertension Paternal Grandmother   . Depression Paternal Grandmother   . Hypertension Paternal Grandfather    Social History:  reports that he has been smoking cigarettes.  He has a 1.60 pack-year smoking history. he has never used smokeless tobacco. His alcohol and drug histories are not on file. Allergies  Allergen Reactions  . Nicotine Other (See Comments)    Experiences nightmare  . Percocet  [Oxycodone-Acetaminophen] Itching and Other (See Comments)    constipation   Prior to Admission medications   Medication Sig Start Date End Date Taking? Authorizing Provider  cinacalcet (SENSIPAR) 60 MG tablet Take 60 mg by mouth daily.   Yes [provider]  escitalopram (LEXAPRO) 5 MG tablet Take 5 mg by mouth at bedtime.   Yes [provider]  lisinopril (PRINIVIL,ZESTRIL) 10 MG tablet Take 10 mg by mouth daily.   Yes [provider]  sucroferric oxyhydroxide (VELPHORO) 500 MG chewable tablet Chew 1,000 mg by mouth 3 (three) times daily.   Yes [provider]  tamsulosin (FLOMAX) 0.4 MG CAPS capsule Take 0.4 mg by mouth daily.   Yes [provider]  lisinopril-hydrochlorothiazide (PRINZIDE,ZESTORETIC) 20-12.5 MG per tablet Take 1 tablet by mouth daily. Patient not taking: Reported on 03/11/2017 05/29/11 05/28/12  [name], MD   Current Facility-Administered Medications  Medication Dose Route Frequency Provider Last Rate Last Dose  . hydrALAZINE (APRESOLINE) injection 10 mg  10 mg Intravenous Q6H PRN Samella Parr, NP       Current Outpatient Medications  Medication Sig Dispense Refill  . cinacalcet (SENSIPAR) 60 MG tablet Take 60 mg by mouth daily.    Marland Kitchen escitalopram (LEXAPRO) 5 MG tablet Take 5 mg by mouth at bedtime.    Marland Kitchen lisinopril (PRINIVIL,ZESTRIL) 10 MG tablet Take 10 mg  by mouth daily.    . sucroferric oxyhydroxide (VELPHORO) 500 MG chewable tablet Chew 1,000 mg by mouth 3 (three) times daily.    . tamsulosin (FLOMAX) 0.4 MG CAPS capsule Take 0.4 mg by mouth daily.    Marland Kitchen lisinopril-hydrochlorothiazide (PRINZIDE,ZESTORETIC) 20-12.5 MG per tablet Take 1 tablet by mouth daily. (Patient not taking: Reported on 03/11/2017) 30 tablet 2   Labs: Basic Metabolic Panel: Recent Labs  Lab 03/19/17 1124 03/19/17 1142  NA 138 140  K >7.5* 8.1*  CL 112* 117*  CO2 19*  --   GLUCOSE 85 80  BUN 78* 75*  CREATININE 14.12* 13.70*   CALCIUM 9.8  --    Liver Function Tests: Recent Labs  Lab 03/19/17 1124  AST 22  ALT 30  ALKPHOS 71  BILITOT 0.7  PROT 7.2  ALBUMIN 4.0   CBC: Recent Labs  Lab 03/19/17 1124 03/19/17 1142  WBC 8.9  --   NEUTROABS 5.1  --   HGB 12.2* 12.9*  HCT 37.2* 38.0*  MCV 107.5*  --   PLT 243  --    ROS: As per HPI otherwise negative.  Physical Exam: Vitals:   03/19/17 1237 03/19/17 1240 03/19/17 1245 03/19/17 1254  BP: (!) 152/86  139/80   Pulse:  (!) 53 (!) 59 72  Resp: 20 12 13 18   Temp:      TempSrc:      SpO2:  99% 98% 100%  Weight:      Height:         General: WDWN AAM initially SOB but breathing better on room air now while on HD Head: NCAT sclera not icteric MMM Neck: Supple.  Lungs: dim BS/few crackles.  Heart: RRR with S1 S2.  Abdomen: soft NT + BS Lower extremities:without edema or ischemic changes, no open wounds, skin very dry Neuro: A & O  X 3. Moves all extremities spontaneously. Psych:  Responds to questions appropriately with a normal affect. Dialysis Access: left AVF + bruit/thrill  Dialysis Orders: [facility] 4 hr EDW 101  post weights run 101.7 - 101.9 kg per patient  gains 1.5 - 3 L left  AVF 500/800 2 K 2.25 Ca 14 gauge - venofer 50 per week -calcitriol 2.25 - no ESA heparin 4000 - BP run 130 during tmt Sensipar 60 q day and 2 velphoro ac meals- not with snacks Recent labs: hgb 12.3 11/21 32% sat 11/08 alb 4.2 Ca 9.2 corrected P 5.8 ipTH 291   Assessment/Plan: 1. Hyperkalemia K > 7.5 -has been treated chemically; start on 1 K - repeat istat K in 1 hours - due to missed HD - emergent HD today.  Tried to impress upon him how dangerously high this is and he is at high risk for sudden death. 2. ESRD -  Needs to be CLIPPED to [facility] since he will be living on [address]. Will start the process; check Hep BsAg; Apparently he left without a plan during his last ED visit.  We will contact his home unit to try to get records and also talk with  [facility] [place] to try to sort this out. Plan HD again on Friday while we sort this out. Uses 14 gauge needles - we are using 15 at 450 today because we didn't have home orders when HD was intitated. 3. Hypertension/volume  - decrease volume - continue lisinopril 10 - EDW 101 4. Anemia  - hgb 12 no ESA  - on weekly Fe as outpt- resume  after d/c 5. Metabolic bone disease -  Continue sensipar/velphoro - resume calcitriol 2.25 at discharge - Ca on the high side - possibly he has been missing his sensipar 6. Nutrition - renal diet  Myriam Jacobson, PA-C [facility] Beeper [phone] 03/19/2017, 1:02 PM

## 2017-03-19 NOTE — ED Provider Notes (Signed)
[facility] EMERGENCY DEPARTMENT Provider Note   CSN: [id number] Arrival date & time: 03/19/17  0941     History   Chief Complaint Chief Complaint  Patient presents with  . needs dialysis    HPI Cory Dixon is a 31 y.o. male.  HPI 31 year old male who presents for dialysis needs.  He has a history of hypertension and end-stage renal disease on hemodialysis.  He recently relocated from [place], and has not been yet set up for dialysis as an outpatient.  He was seen in the emergency department December 5 for dialysis needs, and the provider had spoken with nephrology who had plans to arrange for outpatient dialysis for patient.  He states that there has been delays in getting him scheduled to go for routine dialysis.  He is still not yet had dialysis in about 1-1/2-2 weeks.  Yesterday felt dyspnea with exertion and had trouble sleeping due to shortness of breath.  Has had increased abdominal girth but no lower extremity edema, chest pains, nausea or vomiting.  Does have mild fatigue and generalized body aches.   Past Medical History:  Diagnosis Date  . Chicken pox   . Headache(784.0)   . Hypertension   . Migraine     Patient Active Problem List   Diagnosis Date Noted  . Acute hyperkalemia 03/19/2017  . CKD (chronic kidney disease) stage V requiring chronic dialysis ([place]) 03/19/2017  . HTN (hypertension) 03/19/2017  . Abnormal EKG 03/19/2017  . Hypertension 12/30/2010  . Migraine headache 12/30/2010    No past surgical history on file.     Home Medications    Prior to Admission medications   Medication Sig Start Date End Date Taking? Authorizing Provider  cinacalcet (SENSIPAR) 60 MG tablet Take 60 mg by mouth daily.   Yes [provider]  escitalopram (LEXAPRO) 5 MG tablet Take 5 mg by mouth at bedtime.   Yes [provider]  lisinopril (PRINIVIL,ZESTRIL) 10 MG tablet Take 10 mg by mouth daily.   Yes [provider]    sucroferric oxyhydroxide (VELPHORO) 500 MG chewable tablet Chew 1,000 mg by mouth 3 (three) times daily.   Yes [provider]  tamsulosin (FLOMAX) 0.4 MG CAPS capsule Take 0.4 mg by mouth daily.   Yes [provider]  lisinopril-hydrochlorothiazide (PRINZIDE,ZESTORETIC) 20-12.5 MG per tablet Take 1 tablet by mouth daily. Patient not taking: Reported on 03/11/2017 05/29/11 05/28/12  [name], MD    Family History Family History  Problem Relation Age of Onset  . Arthritis Mother   . Arthritis Maternal Grandmother   . Cancer Maternal Grandmother        breast  . Hypertension Maternal Grandmother   . Depression Maternal Grandmother   . Hypertension Maternal Grandfather   . Hypertension Paternal Grandmother   . Depression Paternal Grandmother   . Hypertension Paternal Grandfather     Social History Social History   Tobacco Use  . Smoking status: Current Every Day Smoker    Packs/day: 0.20    Years: 8.00    Pack years: 1.60    Types: Cigarettes  . Smokeless tobacco: Never Used  Substance Use Topics  . Alcohol use: Not on file  . Drug use: Not on file     Allergies   Nicotine and Percocet [oxycodone-acetaminophen]   Review of Systems Review of Systems  Constitutional: Positive for fatigue. Negative for fever.  Respiratory: Positive for shortness of breath.   Cardiovascular: Negative for chest pain.  Gastrointestinal: Negative for abdominal pain, nausea and vomiting.  All other systems reviewed and are negative.    Physical Exam Updated Vital Signs BP 139/80   Pulse 72   Temp 98 F (36.7 C) (Oral)   Resp 18   Ht 6' (1.829 m)   Wt 115.7 kg (255 lb)   SpO2 100%   BMI 34.58 kg/m   Physical Exam Physical Exam  Nursing note and vitals reviewed. Constitutional: Well developed, well nourished, non-toxic, and in no acute distress Head: Normocephalic and atraumatic.  Mouth/Throat: Oropharynx is clear and moist.  Neck: Normal range of  motion. Neck supple.  Cardiovascular: Normal rate and regular rhythm.   Pulmonary/Chest: Effort normal and breath sounds normal.  Abdominal: Soft. There is no tenderness. There is no rebound and no guarding.  Musculoskeletal: Normal range of motion.  Neurological: Alert, no facial droop, fluent speech, moves all extremities symmetrically Skin: Skin is warm and dry.  Psychiatric: Cooperative   ED Treatments / Results  Labs (all labs ordered are listed, but only abnormal results are displayed) Labs Reviewed  CBC WITH DIFFERENTIAL/PLATELET - Abnormal; Notable for the following components:      Result Value   RBC 3.46 (*)    Hemoglobin 12.2 (*)    HCT 37.2 (*)    MCV 107.5 (*)    MCH 35.3 (*)    All other components within normal limits  COMPREHENSIVE METABOLIC PANEL - Abnormal; Notable for the following components:   Potassium >7.5 (*)    Chloride 112 (*)    CO2 19 (*)    BUN 78 (*)    Creatinine, Ser 14.12 (*)    GFR calc non Af Amer 4 (*)    GFR calc Af Amer 5 (*)    All other components within normal limits  I-STAT CHEM 8, ED - Abnormal; Notable for the following components:   Potassium 8.1 (*)    Chloride 117 (*)    BUN 75 (*)    Creatinine, Ser 13.70 (*)    TCO2 19 (*)    Hemoglobin 12.9 (*)    HCT 38.0 (*)    All other components within normal limits  HEPATITIS PANEL, ACUTE  HIV ANTIBODY (ROUTINE TESTING)  CREATININE, SERUM    EKG  EKG Interpretation  Date/Time:  Thursday March 19 2017 11:39:08 EST Ventricular Rate:  59 PR Interval:    QRS Duration: 95 QT Interval:  437 QTC Calculation: 433 R Axis:   86 Text Interpretation:  Sinus rhythm No prior EKG peaked t-waves  Confirmed by Brantley Stage [phone]) on 03/19/2017 11:48:44 AM Also confirmed by Brantley Stage [phone]), editor Laurena Spies [phone])  on 03/19/2017 12:18:45 PM       Radiology No results found.  Procedures Procedures (including critical care time) CRITICAL CARE Performed by: Forde Dandy   Total critical care time: 35 minutes  Critical care time was exclusive of separately billable procedures and treating other patients.  Critical care was necessary to treat or prevent imminent or life-threatening deterioration.  Critical care was time spent personally by me on the following activities: development of treatment plan with patient and/or surrogate as well as nursing, discussions with consultants, evaluation of patient's response to treatment, examination of patient, obtaining history from patient or surrogate, ordering and performing treatments and interventions, ordering and review of laboratory studies, ordering and review of radiographic studies, pulse oximetry and re-evaluation of patient's condition.  Medications Ordered in ED Medications  hydrALAZINE (APRESOLINE) injection 10 mg (  not administered)  heparin injection 5,000 Units (not administered)  sodium chloride flush (NS) 0.9 % injection 3 mL (not administered)  lisinopril (PRINIVIL,ZESTRIL) tablet 10 mg (not administered)  escitalopram (LEXAPRO) tablet 5 mg (not administered)  dextrose 50 % solution 50 mL (50 mLs Intravenous Given 03/19/17 1229)  calcium gluconate inj 10% (1 g) URGENT USE ONLY! (1 g Intravenous Given 03/19/17 1222)  albuterol (PROVENTIL) (2.5 MG/3ML) 0.083% nebulizer solution 10 mg (10 mg Nebulization Given 03/19/17 1226)  insulin aspart (novoLOG) injection 10 Units (10 Units Intravenous Given 03/19/17 1232)     Initial Impression / Assessment and Plan / ED Course  I have reviewed the triage vital signs and the nursing notes.  Pertinent labs & imaging results that were available during my care of the patient were reviewed by me and considered in my medical decision making (see chart for details).     31 year old male who presents for dialysis.  Has not received dialysis in 1-2 weeks.  Nontoxic in no acute distress with stable vital signs.  Does not look overtly fluid overloaded.  Blood work  is notable for hyperkalemia of 8.1.  He does have peaked T waves on his EKG but no widened QRS.  Is given treatment for hyperkalemia including insulin with glucose, albuterol, calcium gluconate.  Discussed with Dr. Melvia Heaps who will arrange dialysis.  Discussed with Erin Hearing who will admit to hospitalist service.  Final Clinical Impressions(s) / ED Diagnoses   Final diagnoses:  Hyperkalemia    ED Discharge Orders    None       Forde Dandy, MD 03/19/17 1316

## 2017-03-19 NOTE — ED Notes (Signed)
No answer for triage.

## 2017-03-19 NOTE — ED Triage Notes (Signed)
Pt here 12/5 for same. Pt is from [place] and has not had dialysis in almost 2 weeks. Pt is waiting to get set up with dialysis. Pt is concerned bc he says it has been too long. Pt was sent an email saying he requires a physical, hep b panel and full lab panel in order to be set up at the dialysis facility. Pt states he does not feel sick and does not want to be admitted he just wants the labs and dialysis so he can get set up at his new facility.

## 2017-03-19 NOTE — Progress Notes (Signed)
Pt arrived from HD via stretcher. Alert and oriented, RA, VSS. Telemetry box4, CCMD notified. IV R AC NSL. Skin warm, dry and intact. Pt has been oriented to room, staff and unit. Call bell and belongings within reach, will continue to monitor.

## 2017-03-19 NOTE — Progress Notes (Signed)
CSW went to speak with pt at bedside. CSW was informed by NT that pt was heading to dialysis at this time. CSW will ask that floor CSW assess and assist with pt needs.     Virgie Dad Tequan Redmon, MSW, [facility] Clinical Social Worker [phone]

## 2017-03-20 ENCOUNTER — Encounter (HOSPITAL_COMMUNITY): Payer: Self-pay | Admitting: Family Medicine

## 2017-03-20 DIAGNOSIS — E875 Hyperkalemia: Principal | ICD-10-CM

## 2017-03-20 DIAGNOSIS — D649 Anemia, unspecified: Secondary | ICD-10-CM | POA: Diagnosis not present

## 2017-03-20 DIAGNOSIS — I120 Hypertensive chronic kidney disease with stage 5 chronic kidney disease or end stage renal disease: Secondary | ICD-10-CM | POA: Diagnosis not present

## 2017-03-20 DIAGNOSIS — N2581 Secondary hyperparathyroidism of renal origin: Secondary | ICD-10-CM | POA: Diagnosis not present

## 2017-03-20 DIAGNOSIS — F329 Major depressive disorder, single episode, unspecified: Secondary | ICD-10-CM | POA: Diagnosis not present

## 2017-03-20 DIAGNOSIS — N186 End stage renal disease: Secondary | ICD-10-CM | POA: Diagnosis not present

## 2017-03-20 LAB — BASIC METABOLIC PANEL
Anion gap: 12 (ref 5–15)
BUN: 38 mg/dL — ABNORMAL HIGH (ref 6–20)
CO2: 25 mmol/L (ref 22–32)
Calcium: 9.3 mg/dL (ref 8.9–10.3)
Chloride: 99 mmol/L — ABNORMAL LOW (ref 101–111)
Creatinine, Ser: 9.29 mg/dL — ABNORMAL HIGH (ref 0.61–1.24)
GFR calc Af Amer: 8 mL/min — ABNORMAL LOW (ref >60)
GFR calc non Af Amer: 7 mL/min — ABNORMAL LOW (ref >60)
Glucose, Bld: 78 mg/dL (ref 65–99)
Potassium: 4.9 mmol/L (ref 3.5–5.1)
Sodium: 136 mmol/L (ref 135–145)

## 2017-03-20 LAB — HEPATITIS PANEL, ACUTE
HCV Ab: 0.1 s/co ratio (ref 0.0–0.9)
Hep A IgM: NEGATIVE
Hep B C IgM: NEGATIVE
Hepatitis B Surface Ag: NEGATIVE

## 2017-03-20 LAB — HIV ANTIBODY (ROUTINE TESTING W REFLEX): HIV SCREEN 4TH GENERATION: NONREACTIVE

## 2017-03-20 MED ORDER — CINACALCET HCL 30 MG PO TABS: 60.0000 mg | ORAL_TABLET | Freq: Every day | ORAL | 1 refills | Status: DC

## 2017-03-20 NOTE — Progress Notes (Signed)
Patient reports that he has to leave now/AMA because his mother is about to be put out of the hotel that they have been living in. He reports he will come back. Pt educated that this would require him to need to be readmitted through the ED. Page to Dr. Nila Nephew for notification. Dorthey Sawyer, RN

## 2017-03-20 NOTE — Progress Notes (Signed)
Cory Dixon to be D/C'd Home per MD order.  Discussed prescriptions and follow up appointments with the patient. Prescriptions given to patient, medication list explained in detail. Pt verbalized understanding.  Allergies as of 03/20/2017      Reactions   Nicotine Other (See Comments)   Experiences nightmare   Percocet [oxycodone-acetaminophen] Itching, Other (See Comments)   constipation      Medication List    STOP taking these medications   lisinopril-hydrochlorothiazide 20-12.5 MG tablet Commonly known as:  PRINZIDE,ZESTORETIC   tamsulosin 0.4 MG Caps capsule Commonly known as:  FLOMAX     TAKE these medications   cinacalcet 30 MG tablet Commonly known as:  SENSIPAR Take 2 tablets (60 mg total) by mouth daily with supper. What changed:    medication strength  when to take this   escitalopram 5 MG tablet Commonly known as:  LEXAPRO Take 5 mg by mouth at bedtime.   lisinopril 10 MG tablet Commonly known as:  PRINIVIL,ZESTRIL Take 10 mg by mouth daily.   VELPHORO 500 MG chewable tablet Generic drug:  sucroferric oxyhydroxide Chew 1,000 mg by mouth 3 (three) times daily.       Vitals:   03/20/17 0439 03/20/17 0844  BP: 132/68 (!) 143/71  Pulse: (!) 58 63  Resp: 19 20  Temp: 98.7 F (37.1 C) 98.1 F (36.7 C)  SpO2: 100% 99%    Skin clean, dry and intact without evidence of skin break down, no evidence of skin tears noted. IV catheter discontinued intact. Site without signs and symptoms of complications. Dressing and pressure applied. Pt denies pain at this time. No complaints noted.  An After Visit Summary was printed and given to the patient. Patient escorted via ambulatory, and D/C home via private auto.  [name] RN [facility] [address] [employer]

## 2017-03-20 NOTE — Progress Notes (Signed)
Accepted at [facility] .1st treatment Monday Dec.17,2018 at 12:00pm  Schedule and chairtime Mon,Wed,Fri at 12:40pm 2nd shift

## 2017-03-20 NOTE — Discharge Summary (Addendum)
Physician Discharge Summary      Patient: Cory Dixon                   Admit date: 03/19/2017   DOB: June 28, 1985             Discharge date:03/20/2017/10:21 AM [id number]                           PCP: [name], MD Recommendations for Outpatient Follow-up:    1.   Accepted at [facility] .1st treatment Monday Dec.17,2018 at 12:00pm  Schedule and chairtime Mon,Wed,Fri at 12:40pm 2nd shift  2. Please follow-up with your primary care physician within 1-2 weeks.   Discharge Condition: Stable  CODE STATUS:  Full code Diet recommendation:   Cardiac diet / Renal diet /  ----------------------------------------------------------------------------------------------------------------------  Discharge Diagnoses:   Principal Problem:   Acute hyperkalemia Active Problems:   Hypertension   Abnormal EKG   ESRD (end stage renal disease) (HCC)   History of present illness : Cory Dixon is a 31 y.o. male with medical history significant for chronic kidney disease on dialysis and hypertension.  Patient has relocated to the [place], [place] area in the past 2 weeks.  He has followed up with his PCP.  He has also attempted to arrange outpatient hemodialysis but has been unsuccessful.  He previously dialyzed with [facility] in [place].  Attempts were made to place him in dialysis through [facility] in [place] but were also unsuccessful.  Patient reports that he was informed that he had to have at least 2 inpatient hospital dialysis treatments as well as have had his hepatology serologies completed before he could proceed with outpatient dialysis.  Patient reports that he is currently living in a motel and is waiting for his new apartment in [place] to be inspected so he can move in.  The apartment is located on [address].  Patient is also in the process of having his Medicare adjusted to his new [facility] residency.  He will not be  eligible for Medicaid in [place] until he has resided here for 30 days.  In the ER patient was noted with generalized weakness, BUN 75, creatinine 13.7, potassium 8.1, abnormal EKG with peaked T waves consistent with acute hyperkalemia.  EDP has given the patient albuterol neb, NovoLog insulin 10 units IV x1, 1 amp of D50 and an amp of calcium gluconate for cardioprotective effects.  Nephrology has seen patient and emergent hemodialysis has been requested.  Of note by the time I examined and interviewed the patient the dialysis unit was called for the patient to be transported to begin the dialysis treatment.  Patient also noted increased shortness of breath-dyspnea on exertion as well as orthopnea.  No increase in lower extremity edema.  In the past 24 hours he is also noticed stools have been more loose and more frequent.  ED Course:  Vital Signs: BP 139/80   Pulse 72   Temp 98 F (36.7 C) (Oral)   Resp 18   Ht 6' (1.829 m)   Wt 115.7 kg (255 lb)   SpO2 100%   BMI 34.58 kg/m  Chest x-ray: Ordered by EDP but not yet completed Lab data: Sodium 140, potassium 8.1, chloride 117, CO2 19, glucose 80, BUN 75, creatinine 13.7, ionized calcium 1.29, LFTs not elevated, white count 8900 with normal differential, hemoglobin 12.2, MCV 108, 243,000 Medications and treatments: NovoLog insulin  10 units IV x1, 1 amp of D50, 1 amp of calcium gluconate, albuterol neb 10 mg [facility] course / Brief Summary:  Acute hyperkalemia/ Abnormal EKG potassium is 8.1, EKG changes noted, patient received iv calcium and albuterol, hemodialysis session on 03/19/2017 to address hyperkalemia. Potassium is much improved to 4.9 this a.m., status post hemodialysis  Active Problems:   ESRD - on  chronic dialysis  -That is post successful hemodialysis on 03/19/2017,,  Pulse team consulted  Accepted at [facility] .1st treatment Monday Dec.17,2018 at 12:00pm  Schedule and chairtime Mon,Wed,Fri at  12:40pm 2nd shift  Patient will follow-up as an outpatient on Monday for scheduled hemodialysis     HTN (hypertension) -Resume lisinopril    Depression -Continue preadmission Lexapro  Disposition -patient was about to leave AMA as his mother and car belonging or at the motel. Prior him leaving AMA all ranges were made Patient was deemed stable to be discharged agreed to follow-up as an outpatient for hemodialysis was scheduled for Mondays Wednesdays and Friday   Consultations:   Allergy  Procedures: Hemodialysis 03/19/2017 ----------------------------------------------------------------------------------------------------------------------  Discharge Instructions:   Discharge Instructions    Activity as tolerated - No restrictions   Complete by:  As directed    Call MD for:  difficulty breathing, headache or visual disturbances   Complete by:  As directed    Call MD for:  persistant dizziness or light-headedness   Complete by:  As directed    Call MD for:  redness, tenderness, or signs of infection (pain, swelling, redness, odor or green/yellow discharge around incision site)   Complete by:  As directed    Call MD for:  temperature >100.4   Complete by:  As directed    Diet - low sodium heart healthy   Complete by:  As directed    Renal   Discharge instructions   Complete by:  As directed    F/up  at [facility] .1st treatment Monday Dec.17,2018 at 12:00pm  Schedule and chairtime Mon,Wed,Fri at 12:40pm 2nd shift   Increase activity slowly   Complete by:  As directed        Medication List    STOP taking these medications   lisinopril-hydrochlorothiazide 20-12.5 MG tablet Commonly known as:  PRINZIDE,ZESTORETIC   tamsulosin 0.4 MG Caps capsule Commonly known as:  FLOMAX     TAKE these medications   cinacalcet 30 MG tablet Commonly known as:  SENSIPAR Take 2 tablets (60 mg total) by mouth daily with supper. What changed:    medication  strength  when to take this   escitalopram 5 MG tablet Commonly known as:  LEXAPRO Take 5 mg by mouth at bedtime.   lisinopril 10 MG tablet Commonly known as:  PRINIVIL,ZESTRIL Take 10 mg by mouth daily.   VELPHORO 500 MG chewable tablet Generic drug:  sucroferric oxyhydroxide Chew 1,000 mg by mouth 3 (three) times daily.       Allergies  Allergen Reactions  . Nicotine Other (See Comments)    Experiences nightmare  . Percocet [Oxycodone-Acetaminophen] Itching and Other (See Comments)    constipation      Procedures/Studies: Dg Chest 2 View  Result Date: 03/19/2017 CLINICAL DATA:  Renal failure patient.  Shortness of breath. EXAM: CHEST  2 VIEW COMPARISON:  None. FINDINGS: Heart size upper limits of normal. Mediastinal shadows are normal. There is venous hypertension with early interstitial edema. No infiltrate, collapse or effusion. Bony structures are unremarkable. IMPRESSION: Borderline cardiomegaly.  Pulmonary venous hypertension with mild interstitial edema. Electronically Signed   By: Nelson Chimes M.D.   On: 03/19/2017 19:00     Subjective: Patient was seen and examined 03/20/2017, 10:21 AM Patient stable  Today. No acute distress.  No issues overnight Stable for discharge.  Discharge Exam:  Vitals:   03/19/17 1733 03/19/17 2052 03/20/17 0439 03/20/17 0844  BP: 137/76 131/77 132/68 (!) 143/71  Pulse: 83 77 (!) 58 63  Resp: 19 20 19 20   Temp: 98.1 F (36.7 C) 98.6 F (37 C) 98.7 F (37.1 C) 98.1 F (36.7 C)  TempSrc: Oral Axillary Oral Oral  SpO2: 100% 100% 100% 99%  Weight:  99.5 kg (219 lb 5.7 oz)    Height:        General: Pt lying comfortably in bed & appears in no obvious distress. Cardiovascular: S1 & S2 heard, RRR, S1/S2 +. No murmurs, rubs, gallops or clicks. No JVD or pedal edema. Respiratory: Clear to auscultation without wheezing, rhonchi or crackles. No increased work of breathing. Abdominal:  Non distended, non tender & soft. No  organomegaly or masses appreciated. Normal bowel sounds heard. CNS: Alert and oriented. No focal deficits. Extremities: no edema, no cyanosis Left arm patent AV fistula   The results of significant diagnostics from this hospitalization (including imaging, microbiology, ancillary and laboratory) are listed below for reference.     Microbiology: Recent Results (from the past 240 hour(s))  MRSA PCR Screening     Status: None   Collection Time: 03/19/17  6:13 PM  Result Value Ref Range Status   MRSA by PCR NEGATIVE NEGATIVE Final    Comment:        The GeneXpert MRSA Assay (FDA approved for NASAL specimens only), is one component of a comprehensive MRSA colonization surveillance program. It is not intended to diagnose MRSA infection nor to guide or monitor treatment for MRSA infections.      Labs: CBC: Recent Labs  Lab 03/19/17 1124 03/19/17 1142 03/19/17 1611  WBC 8.9  --   --   NEUTROABS 5.1  --   --   HGB 12.2* 12.9* 13.6  HCT 37.2* 38.0* 40.0  MCV 107.5*  --   --   PLT 243  --   --    Basic Metabolic Panel: Recent Labs  Lab 03/19/17 1124 03/19/17 1142 03/19/17 1611 03/20/17 0530  NA 138 140 139 136  K >7.5* 8.1* 3.5 4.9  CL 112* 117* 101 99*  CO2 19*  --   --  25  GLUCOSE 85 80 97 78  BUN 78* 75* 32* 38*  CREATININE 14.12* 13.70* 6.50* 9.29*  CALCIUM 9.8  --   --  9.3   Liver Function Tests: Recent Labs  Lab 03/19/17 1124  AST 22  ALT 30  ALKPHOS 71  BILITOT 0.7  PROT 7.2  ALBUMIN 4.0   BNP (last 3 results) No results for input(s): BNP in the last 8760 hours. Cardiac Enzymes: No results for input(s): CKTOTAL, CKMB, CKMBINDEX, TROPONINI in the last 168 hours. CBG: No results for input(s): GLUCAP in the last 168 hours. Hgb A1c No results for input(s): HGBA1C in the last 72 hours. Lipid Profile No results for input(s): CHOL, HDL, LDLCALC, TRIG, CHOLHDL, LDLDIRECT in the last 72 hours. Thyroid function studies No results for input(s): TSH,  T4TOTAL, T3FREE, THYROIDAB in the last 72 hours.  Invalid input(s): FREET3 Anemia work up No results for input(s): VITAMINB12, FOLATE, FERRITIN, TIBC, IRON, RETICCTPCT in the last 37  hours. Urinalysis    Component Value Date/Time   BILIRUBINUR neg 12/30/2010 1151   PROTEINUR ++++ 12/30/2010 1151   UROBILINOGEN 0.2 12/30/2010 1151   NITRITE neg 12/30/2010 1151   LEUKOCYTESUR neg 12/30/2010 1151      Time coordinating discharge: Over 30 minutes  SIGNED: Deatra James, MD, FACP, [facility]. [facility] Pager [phone]  If 7PM-7AM, please contact night-coverage [email or web address] Password [email or web address] 03/20/2017, 10:21 AM

## 2017-03-23 ENCOUNTER — Telehealth: Payer: Self-pay | Admitting: Family Medicine

## 2017-03-23 DIAGNOSIS — D631 Anemia in chronic kidney disease: Secondary | ICD-10-CM | POA: Diagnosis not present

## 2017-03-23 DIAGNOSIS — N186 End stage renal disease: Secondary | ICD-10-CM | POA: Diagnosis not present

## 2017-03-23 DIAGNOSIS — Z992 Dependence on renal dialysis: Secondary | ICD-10-CM | POA: Diagnosis not present

## 2017-03-23 DIAGNOSIS — I129 Hypertensive chronic kidney disease with stage 1 through stage 4 chronic kidney disease, or unspecified chronic kidney disease: Secondary | ICD-10-CM | POA: Diagnosis not present

## 2017-03-23 DIAGNOSIS — N2581 Secondary hyperparathyroidism of renal origin: Secondary | ICD-10-CM | POA: Diagnosis not present

## 2017-03-23 NOTE — Telephone Encounter (Signed)
I spoke with pt briefly, stated the TCM call with questions, he will call us back, we were at the medication review list.

## 2017-03-24 NOTE — Telephone Encounter (Signed)
I left a voice message for pt to return my call.  

## 2017-03-25 DIAGNOSIS — N2581 Secondary hyperparathyroidism of renal origin: Secondary | ICD-10-CM | POA: Diagnosis not present

## 2017-03-25 DIAGNOSIS — D631 Anemia in chronic kidney disease: Secondary | ICD-10-CM | POA: Diagnosis not present

## 2017-03-25 DIAGNOSIS — N186 End stage renal disease: Secondary | ICD-10-CM | POA: Diagnosis not present

## 2017-03-25 NOTE — Telephone Encounter (Signed)
Transition Care Management Follow-up Telephone Call  Patient: Cory Dixon                                                       Admit date: 03/19/2017   DOB: June 03, 1985                                                            Discharge date:03/20/2017/10:21 AM [id number]                                                                                                                                                                                     PCP: [name], MD Recommendations for Outpatient Follow-up:    1.   Accepted at [facility] .1st treatment Monday Dec.17,2018 at 12:00pm Schedule and chairtime Mon,Wed,Fri at 12:40pm 2nd shift  2.         Please follow-up with your primary care physician within 1-2 weeks.   Discharge Condition: Stable      How have you been since you were released from the hospital? "okay"   Do you understand why you were in the hospital? yes   Do you understand the discharge instructions? yes   Where were you discharged to? Home   Items Reviewed:  Medications reviewed: yes  Allergies reviewed: yes  Dietary changes reviewed: yes  Referrals reviewed: yes   Functional Questionnaire:   Activities of Daily Living (ADLs):   He states they are independent in the following: ambulation, bathing and hygiene, feeding, continence, grooming, toileting and dressing States they require assistance with the following: none   Any transportation issues/concerns?: no   Any patient concerns? no   Confirmed importance and date/time of follow-up visits scheduled yes  Provider Appointment booked with Dr. Elease Hashimoto on 04/02/2017 Thursday at 10:00 am  Confirmed with patient if condition begins to worsen call PCP or go to the ER.  Patient was given the office number and encouraged to call back with question or concerns.  : yes

## 2017-03-27 DIAGNOSIS — N186 End stage renal disease: Secondary | ICD-10-CM | POA: Diagnosis not present

## 2017-03-27 DIAGNOSIS — D631 Anemia in chronic kidney disease: Secondary | ICD-10-CM | POA: Diagnosis not present

## 2017-03-27 DIAGNOSIS — N2581 Secondary hyperparathyroidism of renal origin: Secondary | ICD-10-CM | POA: Diagnosis not present

## 2017-04-01 DIAGNOSIS — N2581 Secondary hyperparathyroidism of renal origin: Secondary | ICD-10-CM | POA: Diagnosis not present

## 2017-04-01 DIAGNOSIS — D631 Anemia in chronic kidney disease: Secondary | ICD-10-CM | POA: Diagnosis not present

## 2017-04-01 DIAGNOSIS — N186 End stage renal disease: Secondary | ICD-10-CM | POA: Diagnosis not present

## 2017-04-02 ENCOUNTER — Encounter: Payer: Self-pay | Admitting: Family Medicine

## 2017-04-02 ENCOUNTER — Ambulatory Visit (INDEPENDENT_AMBULATORY_CARE_PROVIDER_SITE_OTHER): Payer: Medicare Other | Admitting: Family Medicine

## 2017-04-02 VITALS — BP 110/80 | HR 70 | Temp 98.3°F | Ht 72.0 in | Wt 219.5 lb

## 2017-04-02 DIAGNOSIS — N186 End stage renal disease: Secondary | ICD-10-CM

## 2017-04-02 DIAGNOSIS — Z992 Dependence on renal dialysis: Secondary | ICD-10-CM | POA: Diagnosis not present

## 2017-04-02 DIAGNOSIS — Z8679 Personal history of other diseases of the circulatory system: Secondary | ICD-10-CM

## 2017-04-02 DIAGNOSIS — F17200 Nicotine dependence, unspecified, uncomplicated: Secondary | ICD-10-CM

## 2017-04-02 DIAGNOSIS — I10 Essential (primary) hypertension: Secondary | ICD-10-CM | POA: Diagnosis not present

## 2017-04-02 DIAGNOSIS — IMO0001 Reserved for inherently not codable concepts without codable children: Secondary | ICD-10-CM | POA: Insufficient documentation

## 2017-04-02 NOTE — Patient Instructions (Signed)
Chronic Kidney Disease, Adult Chronic kidney disease (CKD) occurs when the kidneys become damaged slowly over a long period of time. The kidneys are a pair of organs that do many important jobs in the body, including:  Removing waste and extra fluid from the blood to make urine.  Making hormones that maintain the amount of fluid in tissues and blood vessels.  Maintaining the right amount of fluids and chemicals in the body.  A small amount of kidney damage may not cause problems, but a large amount of damage may make it hard or impossible for the kidneys to work the way they should. If steps are not taken to slow down kidney damage or to stop it from getting worse, the kidneys may stop working permanently (end-stage renal disease or ESRD). Most of the time, CKD does not go away, but it can often be controlled. People who have CKD are usually able to live normal lives. What are the causes? The most common causes of this condition are diabetes and high blood pressure (hypertension). Other causes include:  Heart and blood vessel (cardiovascular) disease.  Kidney diseases, such as: ? Glomerulonephritis. ? Interstitial nephritis. ? Polycystic kidney disease. ? Renal vascular disease.  Diseases that affect the immune system.  Genetic diseases.  Medicines that damage the kidneys, such as anti-inflammatory medicines.  Being around or being in contact with poisonous (toxic) substances.  A kidney or urinary infection that occurs again and again (recurs).  Vasculitis. This is swelling or inflammation of the blood vessels.  A problem with urine flow that may be caused by: ? Cancer. ? Having kidney stones more than one time. ? An enlarged prostate, in males.  What increases the risk? You are more likely to develop this condition if you:  Are older than age 60.  Are male.  Are African-American, Hispanic, Asian, Pacific Islander, or American Indian.  Are a current or former  smoker.  Are obese.  Have a family history of kidney disease or failure.  Often take medicines that are damaging to the kidneys.  What are the signs or symptoms? Symptoms of this condition include:  Swelling (edema) of the face, legs, ankles, or feet.  Tiredness (lethargy) and having less energy.  Nausea or vomiting.  Confusion or trouble concentrating.  Problems with urination, such as: ? Painful or burning feeling during urination. ? Decreased urine production. ? Frequent urination, especially at night. ? Bloody urine.  Muscle twitches and cramps, especially in the legs.  Shortness of breath.  Weakness.  Loss of appetite.  Metallic taste in the mouth.  Trouble sleeping.  Dry, itchy skin.  A low blood count (anemia).  Pale lining of the eyelids and surface of the eye (conjunctiva).  Symptoms develop slowly and may not be obvious until the kidney damage becomes severe. It is possible to have kidney disease for years without having any symptoms. How is this diagnosed? This condition may be diagnosed based on:  Blood tests.  Urine tests.  Imaging tests, such as an ultrasound or CT scan.  A test in which a sample of tissue is removed from the kidneys to be examined under a microscope (kidney biopsy).  These test results will help your health care provider determine how serious the CKD is. How is this treated? There is no cure for most cases of this condition, but treatment usually relieves symptoms and prevents or slows the progression of the disease. Treatment may include:  Making diet changes, which may require you to   avoid alcohol, salty foods (sodium), and foods that are high in potassium, calcium, and protein.  Medicines: ? To lower blood pressure. ? To control blood glucose. ? To relieve anemia. ? To relieve swelling. ? To protect your bones. ? To improve the balance of electrolytes in your blood.  Removing toxic waste from the body through  types of dialysis, if the kidneys can no longer do their job (kidney failure).  Managing any other conditions that are causing your CKD or making it worse.  Follow these instructions at home: Medicines  Take over-the-counter and prescription medicines only as told by your health care provider. The dose of some medicines that you take may need to be adjusted.  Do not take any new medicines unless approved by your health care provider. Many medicines can worsen your kidney damage.  Do not take any vitamin and mineral supplements unless approved by your health care provider. Many nutritional supplements can worsen your kidney damage. General instructions  Follow your prescribed diet as told by your health care provider.  Do not use any products that contain nicotine or tobacco, such as cigarettes and e-cigarettes. If you need help quitting, ask your health care provider.  Monitor and track your blood pressure at home. Report changes in your blood pressure as told by your health care provider.  If you are being treated for diabetes, monitor and track your blood sugar (blood glucose) levels as told by your health care provider.  Maintain a healthy weight. If you need help with this, ask your health care provider.  Start or continue an exercise plan. Exercise at least 30 minutes a day, 5 days a week.  Keep your immunizations up to date as told by your health care provider.  Keep all follow-up visits as told by your health care provider. This is important. Where to find more information:  American Association of Kidney Patients: www.aakp.org  National Kidney Foundation: www.kidney.org  American Kidney Fund: www.akfinc.org  Life Options Rehabilitation Program: www.lifeoptions.org and www.kidneyschool.org Contact a health care provider if:  Your symptoms get worse.  You develop new symptoms. Get help right away if:  You develop symptoms of ESRD, which  include: ? Headaches. ? Numbness in the hands or feet. ? Easy bruising. ? Frequent hiccups. ? Chest pain. ? Shortness of breath. ? Lack of menstruation, in women.  You have a fever.  You have decreased urine production.  You have pain or bleeding when you urinate. Summary  Chronic kidney disease (CKD) occurs when the kidneys become damaged slowly over a long period of time.  The most common causes of this condition are diabetes and high blood pressure (hypertension).  There is no cure for most cases of this condition, but treatment usually relieves symptoms and prevents or slows the progression of the disease. Treatment may include a combination of medicines and lifestyle changes. This information is not intended to replace advice given to you by your health care provider. Make sure you discuss any questions you have with your health care provider. Document Released: 01/01/2008 Document Revised: 05/01/2016 Document Reviewed: 05/01/2016 Elsevier Interactive Patient Education  2018 Elsevier Inc.  

## 2017-04-02 NOTE — Progress Notes (Signed)
Subjective:     Patient ID: Cory Dixon, male   DOB: [birth year], 31 y.o.   MRN: [id number]  HPI Patient seen to reestablish care. He has not been seen here since 2012. She has history of hypertension and past history of poor compliance and was basically lost to follow-up for some time. He moved to [place] after he was here and was followed for some time by nephrologist and then moved to [facility] and recently moved back. He was admitted on December 13 basically for emergency dialysis. He had some difficulties getting established to get dialysis started after first moving back.   His admission potassium was 8.1 with creatinine 13.7 and BUN of 75. EKG showed peaked T waves. His hyperkalemia was corrected with albuterol nebulizer, NovoLog insulin, 1 amp of D50 and and calcium gluconate. He is now back established and getting dialysis every Monday, Wednesday, Friday.  He has history of obesity and was over 300 pounds at one point and has managed to keep his weight down. He has history of A. fib and for some time was on anticoagulation. Still smokes half pack cigarettes per day or less.  Blood pressure been controlled with low-dose lisinopril 10 mg daily. He states he has never had any significant edema issues in his lower extremities.  Past Medical History:  Diagnosis Date  . Chicken pox   . CKD (chronic kidney disease), stage V ([place])   . Headache(784.0)   . Hyperkalemia 03/2017  . Hypertension   . Migraine    Past Surgical History:  Procedure Laterality Date  . DIALYSIS FISTULA CREATION      reports that he has been smoking cigarettes.  He has a 1.60 pack-year smoking history. he has never used smokeless tobacco. He reports that he does not drink alcohol or use drugs. family history includes Arthritis in his maternal grandmother and mother; Cancer in his maternal grandmother; Depression in his maternal grandmother and paternal grandmother; Hypertension in his maternal grandfather,  maternal grandmother, paternal grandfather, and paternal grandmother. Allergies  Allergen Reactions  . Nicotine Other (See Comments)    Experiences nightmare  . Percocet [Oxycodone-Acetaminophen] Itching and Other (See Comments)    constipation     Review of Systems  Constitutional: Negative for chills, fatigue and fever.  Eyes: Negative for visual disturbance.  Respiratory: Negative for cough, chest tightness and shortness of breath.   Cardiovascular: Negative for chest pain, palpitations and leg swelling.  Neurological: Negative for dizziness, syncope, weakness, light-headedness and headaches.       Objective:   Physical Exam  Constitutional: He appears well-developed and well-nourished.  Neck: Neck supple. No thyromegaly present.  Cardiovascular: Normal rate and regular rhythm.  Pulmonary/Chest: Effort normal and breath sounds normal. No respiratory distress. He has no wheezes. He has no rales.  Musculoskeletal: He exhibits no edema.       Assessment:     #1 end-stage renal disease on dialysis  #2 hypertension stable and at goal  #3 ongoing nicotine use  #4 history of transient atrial fibrillation    Plan:     -Flu vaccine are given. He also states he's had previous Pneumovax -Recommend smoking cessation but his current motivation is low -Continue close follow-up with nephrology  Cory Post MD [facility] [place]

## 2017-04-03 DIAGNOSIS — N186 End stage renal disease: Secondary | ICD-10-CM | POA: Diagnosis not present

## 2017-04-03 DIAGNOSIS — D631 Anemia in chronic kidney disease: Secondary | ICD-10-CM | POA: Diagnosis not present

## 2017-04-03 DIAGNOSIS — N2581 Secondary hyperparathyroidism of renal origin: Secondary | ICD-10-CM | POA: Diagnosis not present

## 2017-04-08 DIAGNOSIS — N2581 Secondary hyperparathyroidism of renal origin: Secondary | ICD-10-CM | POA: Diagnosis not present

## 2017-04-08 DIAGNOSIS — D631 Anemia in chronic kidney disease: Secondary | ICD-10-CM | POA: Diagnosis not present

## 2017-04-08 DIAGNOSIS — N186 End stage renal disease: Secondary | ICD-10-CM | POA: Diagnosis not present

## 2017-04-10 DIAGNOSIS — D631 Anemia in chronic kidney disease: Secondary | ICD-10-CM | POA: Diagnosis not present

## 2017-04-10 DIAGNOSIS — N186 End stage renal disease: Secondary | ICD-10-CM | POA: Diagnosis not present

## 2017-04-10 DIAGNOSIS — N2581 Secondary hyperparathyroidism of renal origin: Secondary | ICD-10-CM | POA: Diagnosis not present

## 2017-04-13 DIAGNOSIS — D631 Anemia in chronic kidney disease: Secondary | ICD-10-CM | POA: Diagnosis not present

## 2017-04-13 DIAGNOSIS — N2581 Secondary hyperparathyroidism of renal origin: Secondary | ICD-10-CM | POA: Diagnosis not present

## 2017-04-13 DIAGNOSIS — N186 End stage renal disease: Secondary | ICD-10-CM | POA: Diagnosis not present

## 2017-04-15 DIAGNOSIS — D631 Anemia in chronic kidney disease: Secondary | ICD-10-CM | POA: Diagnosis not present

## 2017-04-15 DIAGNOSIS — N186 End stage renal disease: Secondary | ICD-10-CM | POA: Diagnosis not present

## 2017-04-15 DIAGNOSIS — N2581 Secondary hyperparathyroidism of renal origin: Secondary | ICD-10-CM | POA: Diagnosis not present

## 2017-04-17 DIAGNOSIS — N186 End stage renal disease: Secondary | ICD-10-CM | POA: Diagnosis not present

## 2017-04-17 DIAGNOSIS — D631 Anemia in chronic kidney disease: Secondary | ICD-10-CM | POA: Diagnosis not present

## 2017-04-17 DIAGNOSIS — N2581 Secondary hyperparathyroidism of renal origin: Secondary | ICD-10-CM | POA: Diagnosis not present

## 2017-04-20 DIAGNOSIS — N2581 Secondary hyperparathyroidism of renal origin: Secondary | ICD-10-CM | POA: Diagnosis not present

## 2017-04-20 DIAGNOSIS — D631 Anemia in chronic kidney disease: Secondary | ICD-10-CM | POA: Diagnosis not present

## 2017-04-20 DIAGNOSIS — N186 End stage renal disease: Secondary | ICD-10-CM | POA: Diagnosis not present

## 2017-04-24 DIAGNOSIS — D631 Anemia in chronic kidney disease: Secondary | ICD-10-CM | POA: Diagnosis not present

## 2017-04-24 DIAGNOSIS — N2581 Secondary hyperparathyroidism of renal origin: Secondary | ICD-10-CM | POA: Diagnosis not present

## 2017-04-24 DIAGNOSIS — N186 End stage renal disease: Secondary | ICD-10-CM | POA: Diagnosis not present

## 2017-04-27 DIAGNOSIS — N186 End stage renal disease: Secondary | ICD-10-CM | POA: Diagnosis not present

## 2017-04-27 DIAGNOSIS — N2581 Secondary hyperparathyroidism of renal origin: Secondary | ICD-10-CM | POA: Diagnosis not present

## 2017-04-27 DIAGNOSIS — D631 Anemia in chronic kidney disease: Secondary | ICD-10-CM | POA: Diagnosis not present

## 2017-04-29 DIAGNOSIS — D631 Anemia in chronic kidney disease: Secondary | ICD-10-CM | POA: Diagnosis not present

## 2017-04-29 DIAGNOSIS — N186 End stage renal disease: Secondary | ICD-10-CM | POA: Diagnosis not present

## 2017-04-29 DIAGNOSIS — N2581 Secondary hyperparathyroidism of renal origin: Secondary | ICD-10-CM | POA: Diagnosis not present

## 2017-05-02 DIAGNOSIS — N186 End stage renal disease: Secondary | ICD-10-CM | POA: Diagnosis not present

## 2017-05-02 DIAGNOSIS — N2581 Secondary hyperparathyroidism of renal origin: Secondary | ICD-10-CM | POA: Diagnosis not present

## 2017-05-02 DIAGNOSIS — D631 Anemia in chronic kidney disease: Secondary | ICD-10-CM | POA: Diagnosis not present

## 2017-05-04 DIAGNOSIS — N186 End stage renal disease: Secondary | ICD-10-CM | POA: Diagnosis not present

## 2017-05-04 DIAGNOSIS — N2581 Secondary hyperparathyroidism of renal origin: Secondary | ICD-10-CM | POA: Diagnosis not present

## 2017-05-04 DIAGNOSIS — D631 Anemia in chronic kidney disease: Secondary | ICD-10-CM | POA: Diagnosis not present

## 2017-05-06 DIAGNOSIS — D631 Anemia in chronic kidney disease: Secondary | ICD-10-CM | POA: Diagnosis not present

## 2017-05-06 DIAGNOSIS — N186 End stage renal disease: Secondary | ICD-10-CM | POA: Diagnosis not present

## 2017-05-06 DIAGNOSIS — N2581 Secondary hyperparathyroidism of renal origin: Secondary | ICD-10-CM | POA: Diagnosis not present

## 2017-05-07 DIAGNOSIS — N186 End stage renal disease: Secondary | ICD-10-CM | POA: Diagnosis not present

## 2017-05-07 DIAGNOSIS — I129 Hypertensive chronic kidney disease with stage 1 through stage 4 chronic kidney disease, or unspecified chronic kidney disease: Secondary | ICD-10-CM | POA: Diagnosis not present

## 2017-05-07 DIAGNOSIS — Z992 Dependence on renal dialysis: Secondary | ICD-10-CM | POA: Diagnosis not present

## 2017-05-08 DIAGNOSIS — D631 Anemia in chronic kidney disease: Secondary | ICD-10-CM | POA: Diagnosis not present

## 2017-05-08 DIAGNOSIS — I129 Hypertensive chronic kidney disease with stage 1 through stage 4 chronic kidney disease, or unspecified chronic kidney disease: Secondary | ICD-10-CM | POA: Diagnosis not present

## 2017-05-08 DIAGNOSIS — Z992 Dependence on renal dialysis: Secondary | ICD-10-CM | POA: Diagnosis not present

## 2017-05-08 DIAGNOSIS — N186 End stage renal disease: Secondary | ICD-10-CM | POA: Diagnosis not present

## 2017-05-08 DIAGNOSIS — N2581 Secondary hyperparathyroidism of renal origin: Secondary | ICD-10-CM | POA: Diagnosis not present

## 2017-05-13 DIAGNOSIS — N2581 Secondary hyperparathyroidism of renal origin: Secondary | ICD-10-CM | POA: Diagnosis not present

## 2017-05-13 DIAGNOSIS — N186 End stage renal disease: Secondary | ICD-10-CM | POA: Diagnosis not present

## 2017-05-13 DIAGNOSIS — D631 Anemia in chronic kidney disease: Secondary | ICD-10-CM | POA: Diagnosis not present

## 2017-05-15 DIAGNOSIS — N186 End stage renal disease: Secondary | ICD-10-CM | POA: Diagnosis not present

## 2017-05-15 DIAGNOSIS — D631 Anemia in chronic kidney disease: Secondary | ICD-10-CM | POA: Diagnosis not present

## 2017-05-15 DIAGNOSIS — N2581 Secondary hyperparathyroidism of renal origin: Secondary | ICD-10-CM | POA: Diagnosis not present

## 2017-05-18 DIAGNOSIS — N186 End stage renal disease: Secondary | ICD-10-CM | POA: Diagnosis not present

## 2017-05-18 DIAGNOSIS — D631 Anemia in chronic kidney disease: Secondary | ICD-10-CM | POA: Diagnosis not present

## 2017-05-18 DIAGNOSIS — N2581 Secondary hyperparathyroidism of renal origin: Secondary | ICD-10-CM | POA: Diagnosis not present

## 2017-05-22 DIAGNOSIS — D631 Anemia in chronic kidney disease: Secondary | ICD-10-CM | POA: Diagnosis not present

## 2017-05-22 DIAGNOSIS — N186 End stage renal disease: Secondary | ICD-10-CM | POA: Diagnosis not present

## 2017-05-22 DIAGNOSIS — N2581 Secondary hyperparathyroidism of renal origin: Secondary | ICD-10-CM | POA: Diagnosis not present

## 2017-05-25 DIAGNOSIS — N186 End stage renal disease: Secondary | ICD-10-CM | POA: Diagnosis not present

## 2017-05-25 DIAGNOSIS — N2581 Secondary hyperparathyroidism of renal origin: Secondary | ICD-10-CM | POA: Diagnosis not present

## 2017-05-25 DIAGNOSIS — D631 Anemia in chronic kidney disease: Secondary | ICD-10-CM | POA: Diagnosis not present

## 2017-05-27 DIAGNOSIS — N186 End stage renal disease: Secondary | ICD-10-CM | POA: Diagnosis not present

## 2017-05-27 DIAGNOSIS — D631 Anemia in chronic kidney disease: Secondary | ICD-10-CM | POA: Diagnosis not present

## 2017-05-27 DIAGNOSIS — N2581 Secondary hyperparathyroidism of renal origin: Secondary | ICD-10-CM | POA: Diagnosis not present

## 2017-05-29 DIAGNOSIS — N186 End stage renal disease: Secondary | ICD-10-CM | POA: Diagnosis not present

## 2017-05-29 DIAGNOSIS — N2581 Secondary hyperparathyroidism of renal origin: Secondary | ICD-10-CM | POA: Diagnosis not present

## 2017-05-29 DIAGNOSIS — D631 Anemia in chronic kidney disease: Secondary | ICD-10-CM | POA: Diagnosis not present

## 2017-06-03 DIAGNOSIS — D631 Anemia in chronic kidney disease: Secondary | ICD-10-CM | POA: Diagnosis not present

## 2017-06-03 DIAGNOSIS — N186 End stage renal disease: Secondary | ICD-10-CM | POA: Diagnosis not present

## 2017-06-03 DIAGNOSIS — N2581 Secondary hyperparathyroidism of renal origin: Secondary | ICD-10-CM | POA: Diagnosis not present

## 2017-06-05 DIAGNOSIS — N186 End stage renal disease: Secondary | ICD-10-CM | POA: Diagnosis not present

## 2017-06-05 DIAGNOSIS — I129 Hypertensive chronic kidney disease with stage 1 through stage 4 chronic kidney disease, or unspecified chronic kidney disease: Secondary | ICD-10-CM | POA: Diagnosis not present

## 2017-06-05 DIAGNOSIS — Z992 Dependence on renal dialysis: Secondary | ICD-10-CM | POA: Diagnosis not present

## 2017-06-05 DIAGNOSIS — N2581 Secondary hyperparathyroidism of renal origin: Secondary | ICD-10-CM | POA: Diagnosis not present

## 2017-06-05 DIAGNOSIS — D631 Anemia in chronic kidney disease: Secondary | ICD-10-CM | POA: Diagnosis not present

## 2017-06-08 DIAGNOSIS — D631 Anemia in chronic kidney disease: Secondary | ICD-10-CM | POA: Diagnosis not present

## 2017-06-08 DIAGNOSIS — N186 End stage renal disease: Secondary | ICD-10-CM | POA: Diagnosis not present

## 2017-06-08 DIAGNOSIS — N2581 Secondary hyperparathyroidism of renal origin: Secondary | ICD-10-CM | POA: Diagnosis not present

## 2017-06-12 DIAGNOSIS — N2581 Secondary hyperparathyroidism of renal origin: Secondary | ICD-10-CM | POA: Diagnosis not present

## 2017-06-12 DIAGNOSIS — D631 Anemia in chronic kidney disease: Secondary | ICD-10-CM | POA: Diagnosis not present

## 2017-06-12 DIAGNOSIS — N186 End stage renal disease: Secondary | ICD-10-CM | POA: Diagnosis not present

## 2017-06-15 DIAGNOSIS — N2581 Secondary hyperparathyroidism of renal origin: Secondary | ICD-10-CM | POA: Diagnosis not present

## 2017-06-15 DIAGNOSIS — N186 End stage renal disease: Secondary | ICD-10-CM | POA: Diagnosis not present

## 2017-06-15 DIAGNOSIS — D631 Anemia in chronic kidney disease: Secondary | ICD-10-CM | POA: Diagnosis not present

## 2017-06-19 DIAGNOSIS — N186 End stage renal disease: Secondary | ICD-10-CM | POA: Diagnosis not present

## 2017-06-19 DIAGNOSIS — D631 Anemia in chronic kidney disease: Secondary | ICD-10-CM | POA: Diagnosis not present

## 2017-06-19 DIAGNOSIS — N2581 Secondary hyperparathyroidism of renal origin: Secondary | ICD-10-CM | POA: Diagnosis not present

## 2017-06-22 DIAGNOSIS — N186 End stage renal disease: Secondary | ICD-10-CM | POA: Diagnosis not present

## 2017-06-22 DIAGNOSIS — D631 Anemia in chronic kidney disease: Secondary | ICD-10-CM | POA: Diagnosis not present

## 2017-06-22 DIAGNOSIS — N2581 Secondary hyperparathyroidism of renal origin: Secondary | ICD-10-CM | POA: Diagnosis not present

## 2017-06-24 DIAGNOSIS — D631 Anemia in chronic kidney disease: Secondary | ICD-10-CM | POA: Diagnosis not present

## 2017-06-24 DIAGNOSIS — N186 End stage renal disease: Secondary | ICD-10-CM | POA: Diagnosis not present

## 2017-06-24 DIAGNOSIS — N2581 Secondary hyperparathyroidism of renal origin: Secondary | ICD-10-CM | POA: Diagnosis not present

## 2017-06-26 DIAGNOSIS — N186 End stage renal disease: Secondary | ICD-10-CM | POA: Diagnosis not present

## 2017-06-26 DIAGNOSIS — N2581 Secondary hyperparathyroidism of renal origin: Secondary | ICD-10-CM | POA: Diagnosis not present

## 2017-06-26 DIAGNOSIS — D631 Anemia in chronic kidney disease: Secondary | ICD-10-CM | POA: Diagnosis not present

## 2017-07-01 DIAGNOSIS — N186 End stage renal disease: Secondary | ICD-10-CM | POA: Diagnosis not present

## 2017-07-01 DIAGNOSIS — D631 Anemia in chronic kidney disease: Secondary | ICD-10-CM | POA: Diagnosis not present

## 2017-07-01 DIAGNOSIS — N2581 Secondary hyperparathyroidism of renal origin: Secondary | ICD-10-CM | POA: Diagnosis not present

## 2017-07-03 DIAGNOSIS — N2581 Secondary hyperparathyroidism of renal origin: Secondary | ICD-10-CM | POA: Diagnosis not present

## 2017-07-03 DIAGNOSIS — D631 Anemia in chronic kidney disease: Secondary | ICD-10-CM | POA: Diagnosis not present

## 2017-07-03 DIAGNOSIS — N186 End stage renal disease: Secondary | ICD-10-CM | POA: Diagnosis not present

## 2017-07-06 DIAGNOSIS — N186 End stage renal disease: Secondary | ICD-10-CM | POA: Diagnosis not present

## 2017-07-06 DIAGNOSIS — Z992 Dependence on renal dialysis: Secondary | ICD-10-CM | POA: Diagnosis not present

## 2017-07-06 DIAGNOSIS — I129 Hypertensive chronic kidney disease with stage 1 through stage 4 chronic kidney disease, or unspecified chronic kidney disease: Secondary | ICD-10-CM | POA: Diagnosis not present

## 2017-07-08 DIAGNOSIS — N2581 Secondary hyperparathyroidism of renal origin: Secondary | ICD-10-CM | POA: Diagnosis not present

## 2017-07-08 DIAGNOSIS — Z23 Encounter for immunization: Secondary | ICD-10-CM | POA: Diagnosis not present

## 2017-07-08 DIAGNOSIS — D509 Iron deficiency anemia, unspecified: Secondary | ICD-10-CM | POA: Diagnosis not present

## 2017-07-08 DIAGNOSIS — D631 Anemia in chronic kidney disease: Secondary | ICD-10-CM | POA: Diagnosis not present

## 2017-07-08 DIAGNOSIS — N186 End stage renal disease: Secondary | ICD-10-CM | POA: Diagnosis not present

## 2017-07-10 DIAGNOSIS — D631 Anemia in chronic kidney disease: Secondary | ICD-10-CM | POA: Diagnosis not present

## 2017-07-10 DIAGNOSIS — N186 End stage renal disease: Secondary | ICD-10-CM | POA: Diagnosis not present

## 2017-07-10 DIAGNOSIS — D509 Iron deficiency anemia, unspecified: Secondary | ICD-10-CM | POA: Diagnosis not present

## 2017-07-10 DIAGNOSIS — Z23 Encounter for immunization: Secondary | ICD-10-CM | POA: Diagnosis not present

## 2017-07-10 DIAGNOSIS — N2581 Secondary hyperparathyroidism of renal origin: Secondary | ICD-10-CM | POA: Diagnosis not present

## 2017-07-15 DIAGNOSIS — Z23 Encounter for immunization: Secondary | ICD-10-CM | POA: Diagnosis not present

## 2017-07-15 DIAGNOSIS — D631 Anemia in chronic kidney disease: Secondary | ICD-10-CM | POA: Diagnosis not present

## 2017-07-15 DIAGNOSIS — N2581 Secondary hyperparathyroidism of renal origin: Secondary | ICD-10-CM | POA: Diagnosis not present

## 2017-07-15 DIAGNOSIS — N186 End stage renal disease: Secondary | ICD-10-CM | POA: Diagnosis not present

## 2017-07-15 DIAGNOSIS — D509 Iron deficiency anemia, unspecified: Secondary | ICD-10-CM | POA: Diagnosis not present

## 2017-07-17 DIAGNOSIS — D509 Iron deficiency anemia, unspecified: Secondary | ICD-10-CM | POA: Diagnosis not present

## 2017-07-17 DIAGNOSIS — N2581 Secondary hyperparathyroidism of renal origin: Secondary | ICD-10-CM | POA: Diagnosis not present

## 2017-07-17 DIAGNOSIS — N186 End stage renal disease: Secondary | ICD-10-CM | POA: Diagnosis not present

## 2017-07-17 DIAGNOSIS — D631 Anemia in chronic kidney disease: Secondary | ICD-10-CM | POA: Diagnosis not present

## 2017-07-17 DIAGNOSIS — Z23 Encounter for immunization: Secondary | ICD-10-CM | POA: Diagnosis not present

## 2017-07-22 DIAGNOSIS — D509 Iron deficiency anemia, unspecified: Secondary | ICD-10-CM | POA: Diagnosis not present

## 2017-07-22 DIAGNOSIS — N186 End stage renal disease: Secondary | ICD-10-CM | POA: Diagnosis not present

## 2017-07-22 DIAGNOSIS — Z23 Encounter for immunization: Secondary | ICD-10-CM | POA: Diagnosis not present

## 2017-07-22 DIAGNOSIS — N2581 Secondary hyperparathyroidism of renal origin: Secondary | ICD-10-CM | POA: Diagnosis not present

## 2017-07-22 DIAGNOSIS — D631 Anemia in chronic kidney disease: Secondary | ICD-10-CM | POA: Diagnosis not present

## 2017-07-24 DIAGNOSIS — N2581 Secondary hyperparathyroidism of renal origin: Secondary | ICD-10-CM | POA: Diagnosis not present

## 2017-07-24 DIAGNOSIS — D631 Anemia in chronic kidney disease: Secondary | ICD-10-CM | POA: Diagnosis not present

## 2017-07-24 DIAGNOSIS — D509 Iron deficiency anemia, unspecified: Secondary | ICD-10-CM | POA: Diagnosis not present

## 2017-07-24 DIAGNOSIS — Z23 Encounter for immunization: Secondary | ICD-10-CM | POA: Diagnosis not present

## 2017-07-24 DIAGNOSIS — N186 End stage renal disease: Secondary | ICD-10-CM | POA: Diagnosis not present

## 2017-07-27 DIAGNOSIS — N186 End stage renal disease: Secondary | ICD-10-CM | POA: Diagnosis not present

## 2017-07-27 DIAGNOSIS — D631 Anemia in chronic kidney disease: Secondary | ICD-10-CM | POA: Diagnosis not present

## 2017-07-27 DIAGNOSIS — D509 Iron deficiency anemia, unspecified: Secondary | ICD-10-CM | POA: Diagnosis not present

## 2017-07-27 DIAGNOSIS — N2581 Secondary hyperparathyroidism of renal origin: Secondary | ICD-10-CM | POA: Diagnosis not present

## 2017-07-27 DIAGNOSIS — Z23 Encounter for immunization: Secondary | ICD-10-CM | POA: Diagnosis not present

## 2017-07-29 DIAGNOSIS — N2581 Secondary hyperparathyroidism of renal origin: Secondary | ICD-10-CM | POA: Diagnosis not present

## 2017-07-29 DIAGNOSIS — D631 Anemia in chronic kidney disease: Secondary | ICD-10-CM | POA: Diagnosis not present

## 2017-07-29 DIAGNOSIS — D509 Iron deficiency anemia, unspecified: Secondary | ICD-10-CM | POA: Diagnosis not present

## 2017-07-29 DIAGNOSIS — N186 End stage renal disease: Secondary | ICD-10-CM | POA: Diagnosis not present

## 2017-07-29 DIAGNOSIS — Z23 Encounter for immunization: Secondary | ICD-10-CM | POA: Diagnosis not present

## 2017-08-03 DIAGNOSIS — N186 End stage renal disease: Secondary | ICD-10-CM | POA: Diagnosis not present

## 2017-08-03 DIAGNOSIS — D509 Iron deficiency anemia, unspecified: Secondary | ICD-10-CM | POA: Diagnosis not present

## 2017-08-03 DIAGNOSIS — Z23 Encounter for immunization: Secondary | ICD-10-CM | POA: Diagnosis not present

## 2017-08-03 DIAGNOSIS — N2581 Secondary hyperparathyroidism of renal origin: Secondary | ICD-10-CM | POA: Diagnosis not present

## 2017-08-03 DIAGNOSIS — D631 Anemia in chronic kidney disease: Secondary | ICD-10-CM | POA: Diagnosis not present

## 2017-08-05 DIAGNOSIS — I129 Hypertensive chronic kidney disease with stage 1 through stage 4 chronic kidney disease, or unspecified chronic kidney disease: Secondary | ICD-10-CM | POA: Diagnosis not present

## 2017-08-05 DIAGNOSIS — Z992 Dependence on renal dialysis: Secondary | ICD-10-CM | POA: Diagnosis not present

## 2017-08-05 DIAGNOSIS — N186 End stage renal disease: Secondary | ICD-10-CM | POA: Diagnosis not present

## 2017-08-07 DIAGNOSIS — N2581 Secondary hyperparathyroidism of renal origin: Secondary | ICD-10-CM | POA: Diagnosis not present

## 2017-08-07 DIAGNOSIS — N186 End stage renal disease: Secondary | ICD-10-CM | POA: Diagnosis not present

## 2017-08-10 DIAGNOSIS — N2581 Secondary hyperparathyroidism of renal origin: Secondary | ICD-10-CM | POA: Diagnosis not present

## 2017-08-10 DIAGNOSIS — N186 End stage renal disease: Secondary | ICD-10-CM | POA: Diagnosis not present

## 2017-08-12 DIAGNOSIS — N186 End stage renal disease: Secondary | ICD-10-CM | POA: Diagnosis not present

## 2017-08-12 DIAGNOSIS — N2581 Secondary hyperparathyroidism of renal origin: Secondary | ICD-10-CM | POA: Diagnosis not present

## 2017-08-15 DIAGNOSIS — N2581 Secondary hyperparathyroidism of renal origin: Secondary | ICD-10-CM | POA: Diagnosis not present

## 2017-08-15 DIAGNOSIS — N186 End stage renal disease: Secondary | ICD-10-CM | POA: Diagnosis not present

## 2017-08-19 DIAGNOSIS — N186 End stage renal disease: Secondary | ICD-10-CM | POA: Diagnosis not present

## 2017-08-19 DIAGNOSIS — N2581 Secondary hyperparathyroidism of renal origin: Secondary | ICD-10-CM | POA: Diagnosis not present

## 2017-08-21 DIAGNOSIS — N186 End stage renal disease: Secondary | ICD-10-CM | POA: Diagnosis not present

## 2017-08-21 DIAGNOSIS — N2581 Secondary hyperparathyroidism of renal origin: Secondary | ICD-10-CM | POA: Diagnosis not present

## 2017-08-26 DIAGNOSIS — N186 End stage renal disease: Secondary | ICD-10-CM | POA: Diagnosis not present

## 2017-08-26 DIAGNOSIS — N2581 Secondary hyperparathyroidism of renal origin: Secondary | ICD-10-CM | POA: Diagnosis not present

## 2017-08-28 DIAGNOSIS — N186 End stage renal disease: Secondary | ICD-10-CM | POA: Diagnosis not present

## 2017-08-28 DIAGNOSIS — N2581 Secondary hyperparathyroidism of renal origin: Secondary | ICD-10-CM | POA: Diagnosis not present

## 2017-09-02 DIAGNOSIS — N2581 Secondary hyperparathyroidism of renal origin: Secondary | ICD-10-CM | POA: Diagnosis not present

## 2017-09-02 DIAGNOSIS — N186 End stage renal disease: Secondary | ICD-10-CM | POA: Diagnosis not present

## 2017-09-04 DIAGNOSIS — N2581 Secondary hyperparathyroidism of renal origin: Secondary | ICD-10-CM | POA: Diagnosis not present

## 2017-09-04 DIAGNOSIS — N186 End stage renal disease: Secondary | ICD-10-CM | POA: Diagnosis not present

## 2017-09-05 DIAGNOSIS — Z992 Dependence on renal dialysis: Secondary | ICD-10-CM | POA: Diagnosis not present

## 2017-09-05 DIAGNOSIS — N186 End stage renal disease: Secondary | ICD-10-CM | POA: Diagnosis not present

## 2017-09-05 DIAGNOSIS — I129 Hypertensive chronic kidney disease with stage 1 through stage 4 chronic kidney disease, or unspecified chronic kidney disease: Secondary | ICD-10-CM | POA: Diagnosis not present

## 2017-09-09 DIAGNOSIS — N2581 Secondary hyperparathyroidism of renal origin: Secondary | ICD-10-CM | POA: Diagnosis not present

## 2017-09-09 DIAGNOSIS — E875 Hyperkalemia: Secondary | ICD-10-CM | POA: Diagnosis not present

## 2017-09-09 DIAGNOSIS — N186 End stage renal disease: Secondary | ICD-10-CM | POA: Diagnosis not present

## 2017-09-09 DIAGNOSIS — D631 Anemia in chronic kidney disease: Secondary | ICD-10-CM | POA: Diagnosis not present

## 2017-09-11 DIAGNOSIS — N2581 Secondary hyperparathyroidism of renal origin: Secondary | ICD-10-CM | POA: Diagnosis not present

## 2017-09-11 DIAGNOSIS — D631 Anemia in chronic kidney disease: Secondary | ICD-10-CM | POA: Diagnosis not present

## 2017-09-11 DIAGNOSIS — E875 Hyperkalemia: Secondary | ICD-10-CM | POA: Diagnosis not present

## 2017-09-11 DIAGNOSIS — N186 End stage renal disease: Secondary | ICD-10-CM | POA: Diagnosis not present

## 2017-09-16 DIAGNOSIS — N186 End stage renal disease: Secondary | ICD-10-CM | POA: Diagnosis not present

## 2017-09-16 DIAGNOSIS — D631 Anemia in chronic kidney disease: Secondary | ICD-10-CM | POA: Diagnosis not present

## 2017-09-16 DIAGNOSIS — E875 Hyperkalemia: Secondary | ICD-10-CM | POA: Diagnosis not present

## 2017-09-16 DIAGNOSIS — N2581 Secondary hyperparathyroidism of renal origin: Secondary | ICD-10-CM | POA: Diagnosis not present

## 2017-09-18 DIAGNOSIS — N186 End stage renal disease: Secondary | ICD-10-CM | POA: Diagnosis not present

## 2017-09-18 DIAGNOSIS — E875 Hyperkalemia: Secondary | ICD-10-CM | POA: Diagnosis not present

## 2017-09-18 DIAGNOSIS — D631 Anemia in chronic kidney disease: Secondary | ICD-10-CM | POA: Diagnosis not present

## 2017-09-18 DIAGNOSIS — N2581 Secondary hyperparathyroidism of renal origin: Secondary | ICD-10-CM | POA: Diagnosis not present

## 2017-09-25 DIAGNOSIS — N186 End stage renal disease: Secondary | ICD-10-CM | POA: Diagnosis not present

## 2017-09-25 DIAGNOSIS — E875 Hyperkalemia: Secondary | ICD-10-CM | POA: Diagnosis not present

## 2017-09-25 DIAGNOSIS — N2581 Secondary hyperparathyroidism of renal origin: Secondary | ICD-10-CM | POA: Diagnosis not present

## 2017-09-25 DIAGNOSIS — D631 Anemia in chronic kidney disease: Secondary | ICD-10-CM | POA: Diagnosis not present

## 2017-09-28 DIAGNOSIS — D631 Anemia in chronic kidney disease: Secondary | ICD-10-CM | POA: Diagnosis not present

## 2017-09-28 DIAGNOSIS — E875 Hyperkalemia: Secondary | ICD-10-CM | POA: Diagnosis not present

## 2017-09-28 DIAGNOSIS — N186 End stage renal disease: Secondary | ICD-10-CM | POA: Diagnosis not present

## 2017-09-28 DIAGNOSIS — N2581 Secondary hyperparathyroidism of renal origin: Secondary | ICD-10-CM | POA: Diagnosis not present

## 2017-10-05 DIAGNOSIS — I129 Hypertensive chronic kidney disease with stage 1 through stage 4 chronic kidney disease, or unspecified chronic kidney disease: Secondary | ICD-10-CM | POA: Diagnosis not present

## 2017-10-05 DIAGNOSIS — Z992 Dependence on renal dialysis: Secondary | ICD-10-CM | POA: Diagnosis not present

## 2017-10-05 DIAGNOSIS — N2581 Secondary hyperparathyroidism of renal origin: Secondary | ICD-10-CM | POA: Diagnosis not present

## 2017-10-05 DIAGNOSIS — N186 End stage renal disease: Secondary | ICD-10-CM | POA: Diagnosis not present

## 2017-10-07 DIAGNOSIS — N186 End stage renal disease: Secondary | ICD-10-CM | POA: Diagnosis not present

## 2017-10-07 DIAGNOSIS — N2581 Secondary hyperparathyroidism of renal origin: Secondary | ICD-10-CM | POA: Diagnosis not present

## 2017-10-09 DIAGNOSIS — N2581 Secondary hyperparathyroidism of renal origin: Secondary | ICD-10-CM | POA: Diagnosis not present

## 2017-10-09 DIAGNOSIS — N186 End stage renal disease: Secondary | ICD-10-CM | POA: Diagnosis not present

## 2017-10-12 DIAGNOSIS — N186 End stage renal disease: Secondary | ICD-10-CM | POA: Diagnosis not present

## 2017-10-12 DIAGNOSIS — N2581 Secondary hyperparathyroidism of renal origin: Secondary | ICD-10-CM | POA: Diagnosis not present

## 2017-10-14 DIAGNOSIS — N186 End stage renal disease: Secondary | ICD-10-CM | POA: Diagnosis not present

## 2017-10-14 DIAGNOSIS — N2581 Secondary hyperparathyroidism of renal origin: Secondary | ICD-10-CM | POA: Diagnosis not present

## 2017-10-16 DIAGNOSIS — N2581 Secondary hyperparathyroidism of renal origin: Secondary | ICD-10-CM | POA: Diagnosis not present

## 2017-10-16 DIAGNOSIS — N186 End stage renal disease: Secondary | ICD-10-CM | POA: Diagnosis not present

## 2017-10-21 DIAGNOSIS — N186 End stage renal disease: Secondary | ICD-10-CM | POA: Diagnosis not present

## 2017-10-21 DIAGNOSIS — N2581 Secondary hyperparathyroidism of renal origin: Secondary | ICD-10-CM | POA: Diagnosis not present

## 2017-10-23 ENCOUNTER — Other Ambulatory Visit: Payer: Self-pay

## 2017-10-23 ENCOUNTER — Emergency Department (HOSPITAL_COMMUNITY)
Admission: EM | Admit: 2017-10-23 | Discharge: 2017-10-24 | Disposition: A | Discharge status: Home / Self Care | Payer: Medicare Other | Attending: Emergency Medicine | Admitting: Emergency Medicine

## 2017-10-23 ENCOUNTER — Encounter (HOSPITAL_COMMUNITY): Payer: Self-pay | Admitting: Emergency Medicine

## 2017-10-23 DIAGNOSIS — I120 Hypertensive chronic kidney disease with stage 5 chronic kidney disease or end stage renal disease: Secondary | ICD-10-CM | POA: Insufficient documentation

## 2017-10-23 DIAGNOSIS — L298 Other pruritus: Secondary | ICD-10-CM | POA: Diagnosis not present

## 2017-10-23 DIAGNOSIS — L299 Pruritus, unspecified: Secondary | ICD-10-CM

## 2017-10-23 DIAGNOSIS — Z79899 Other long term (current) drug therapy: Secondary | ICD-10-CM | POA: Insufficient documentation

## 2017-10-23 DIAGNOSIS — N2581 Secondary hyperparathyroidism of renal origin: Secondary | ICD-10-CM | POA: Diagnosis not present

## 2017-10-23 DIAGNOSIS — F17210 Nicotine dependence, cigarettes, uncomplicated: Secondary | ICD-10-CM | POA: Insufficient documentation

## 2017-10-23 DIAGNOSIS — N186 End stage renal disease: Secondary | ICD-10-CM | POA: Insufficient documentation

## 2017-10-23 DIAGNOSIS — R21 Rash and other nonspecific skin eruption: Secondary | ICD-10-CM | POA: Diagnosis not present

## 2017-10-23 DIAGNOSIS — Z992 Dependence on renal dialysis: Secondary | ICD-10-CM | POA: Diagnosis not present

## 2017-10-23 NOTE — ED Triage Notes (Addendum)
Pt reports rash x3 weeks, generalized. Itching, dry scaly skin. Hx dialysis. Reports it started shortly after sexual encounter with new partner while on vacation.

## 2017-10-24 LAB — I-STAT CHEM 8, ED
BUN: 26 mg/dL — ABNORMAL HIGH (ref 6–20)
CREATININE: 7.6 mg/dL — AB (ref 0.61–1.24)
Calcium, Ion: 1.05 mmol/L — ABNORMAL LOW (ref 1.15–1.40)
Chloride: 95 mmol/L — ABNORMAL LOW (ref 98–111)
GLUCOSE: 85 mg/dL (ref 70–99)
HCT: 36 % — ABNORMAL LOW (ref 39.0–52.0)
HEMOGLOBIN: 12.2 g/dL — AB (ref 13.0–17.0)
POTASSIUM: 3.7 mmol/L (ref 3.5–5.1)
Sodium: 138 mmol/L (ref 135–145)
TCO2: 32 mmol/L (ref 22–32)

## 2017-10-24 LAB — AMMONIA: Ammonia: 22 umol/L (ref 9–35)

## 2017-10-24 MED ORDER — PRAMOXINE HCL 1 % EX LOTN: 1.0000 "application " | TOPICAL_LOTION | Freq: Two times a day (BID) | CUTANEOUS | 0 refills | Status: DC

## 2017-10-24 NOTE — ED Provider Notes (Signed)
[facility] Provider Note   CSN: [id number] Arrival date & time: 10/23/17  2320     History   Chief Complaint Chief Complaint  Patient presents with  . Rash    HPI Cory Dixon is a 32 y.o. male.  Patient with history of ESRD on dialysis presenting with a 3-week history of itching and rash.  States he did miss several dialysis sessions in a row about a month ago but is now back on schedule Monday Wednesday Friday.  Last session was today.  He denies any chest pain or shortness of breath.  He still makes some urine.  He has been on dialysis for about 2-1/2 years.  He has been using Benadryl for the rash which helps as well as taking as cold shower.  Symptoms can be worse at night.  No difficulty breathing or difficulty swallowing.   Rash      Past Medical History:  Diagnosis Date  . Chicken pox   . CKD (chronic kidney disease), stage V ([place])   . Headache(784.0)   . Hyperkalemia 03/2017  . Hypertension   . Migraine     Patient Active Problem List   Diagnosis Date Noted  . Smoking 04/02/2017  . History of atrial fibrillation 04/02/2017  . ESRD (end stage renal disease) ([place]) 03/20/2017  . Acute hyperkalemia 03/19/2017  . CKD (chronic kidney disease) stage V requiring chronic dialysis ([place]) 03/19/2017  . HTN (hypertension) 03/19/2017  . Abnormal EKG 03/19/2017  . Hypertension 12/30/2010  . Migraine headache 12/30/2010    Past Surgical History:  Procedure Laterality Date  . DIALYSIS FISTULA CREATION          Home Medications    Prior to Admission medications   Medication Sig Start Date End Date Taking? Authorizing Provider  cinacalcet (SENSIPAR) 30 MG tablet Take 2 tablets (60 mg total) by mouth daily with supper. 03/20/17   Shahmehdi, Valeria Batman, MD  escitalopram (LEXAPRO) 5 MG tablet Take 5 mg by mouth at bedtime.    [provider]  lisinopril (PRINIVIL,ZESTRIL) 10 MG tablet Take 10 mg by mouth daily.     [provider]  sucroferric oxyhydroxide (VELPHORO) 500 MG chewable tablet Chew 1,000 mg by mouth 3 (three) times daily.    [provider]    Family History Family History  Problem Relation Age of Onset  . Arthritis Mother   . Arthritis Maternal Grandmother   . Cancer Maternal Grandmother        breast  . Hypertension Maternal Grandmother   . Depression Maternal Grandmother   . Hypertension Maternal Grandfather   . Hypertension Paternal Grandmother   . Depression Paternal Grandmother   . Hypertension Paternal Grandfather     Social History Social History   Tobacco Use  . Smoking status: Current Every Day Smoker    Packs/day: 0.20    Years: 8.00    Pack years: 1.60    Types: Cigarettes  . Smokeless tobacco: Never Used  Substance Use Topics  . Alcohol use: No    Frequency: Never  . Drug use: No     Allergies   Nicotine and Percocet [oxycodone-acetaminophen]   Review of Systems Review of Systems  Constitutional: Negative for activity change, appetite change and fever.  Respiratory: Negative for cough, chest tightness and shortness of breath.   Cardiovascular: Negative for chest pain.  Gastrointestinal: Negative for abdominal pain, nausea and vomiting.  Genitourinary: Negative for dysuria, hematuria and testicular pain.  Musculoskeletal:  Negative for arthralgias and myalgias.  Skin: Positive for rash.  Neurological: Negative for dizziness, facial asymmetry, weakness and light-headedness.   all other systems are negative except as noted in the HPI and PMH.     Physical Exam Updated Vital Signs BP 140/76   Pulse 72   Temp 98.7 F (37.1 C) (Oral)   Resp 14   Ht 6' (1.829 m)   Wt 97.5 kg (214 lb 15.2 oz)   SpO2 100%   BMI 29.15 kg/m   Physical Exam  Constitutional: He is oriented to person, place, and time. He appears well-developed and well-nourished. No distress.  HENT:  Head: Normocephalic and atraumatic.  Mouth/Throat: Oropharynx  is clear and moist. No oropharyngeal exudate.  Eyes: Pupils are equal, round, and reactive to light. Conjunctivae and EOM are normal.  Neck: Normal range of motion. Neck supple.  No meningismus.  Cardiovascular: Normal rate, regular rhythm, normal heart sounds and intact distal pulses.  No murmur heard. Pulmonary/Chest: Effort normal and breath sounds normal. No respiratory distress.  Abdominal: Soft. There is no tenderness. There is no rebound and no guarding.  Musculoskeletal: Normal range of motion. He exhibits no edema or tenderness.  Left upper extremity fistula with positive thrill  Neurological: He is alert and oriented to person, place, and time. No cranial nerve deficit. He exhibits normal muscle tone. Coordination normal.  No ataxia on finger to nose bilaterally. No pronator drift. 5/5 strength throughout. CN 2-12 intact.Equal grip strength. Sensation intact.   Skin: Skin is warm. Rash noted.  Patient with dry skin throughout, small areas of excoriation throughout.  No evidence of cellulitis or abscess.  Psychiatric: He has a normal mood and affect. His behavior is normal.  Nursing note and vitals reviewed.    ED Treatments / Results  Labs (all labs ordered are listed, but only abnormal results are displayed) Labs Reviewed  I-STAT CHEM 8, ED - Abnormal; Notable for the following components:      Result Value   Chloride 95 (*)    BUN 26 (*)    Creatinine, Ser 7.60 (*)    Calcium, Ion 1.05 (*)    Hemoglobin 12.2 (*)    HCT 36.0 (*)    All other components within normal limits  AMMONIA    EKG None  Radiology No results found.  Procedures Procedures (including critical care time)  Medications Ordered in ED Medications - No data to display   Initial Impression / Assessment and Plan / ED Course  I have reviewed the triage vital signs and the nursing notes.  Pertinent labs & imaging results that were available during my care of the patient were reviewed by me and  considered in my medical decision making (see chart for details).    Dialysis patient with 3 weeks of pruritus after missing several sessions but now back on schedule.  Suspect this is uremic pruritus. Will check electrolytes  Potassium normal.  BUN within acceptable limits.  Ammonia level is normal.  Discussed with patient that he needs to follow-up with nephrology for adjustment of his dialysis regimen.  We will give antihistamines and topical therapies in the meantime.  Discussed he could get water-based emollient. Return precautions discussed. Final Clinical Impressions(s) / ED Diagnoses   Final diagnoses:  Uremic pruritus    ED Discharge Orders    None       Linden Tagliaferro, Annie Main, MD 10/24/17 [phone]

## 2017-10-24 NOTE — Discharge Instructions (Addendum)
Use Benadryl and the cream as prescribed.  Follow-up with your nephrologist as adjustment of your dialysis regimen may help your itching.  Return to the ED if you develop chest pain, shortness of breath or any other concerns.

## 2017-10-26 DIAGNOSIS — N2581 Secondary hyperparathyroidism of renal origin: Secondary | ICD-10-CM | POA: Diagnosis not present

## 2017-10-26 DIAGNOSIS — N186 End stage renal disease: Secondary | ICD-10-CM | POA: Diagnosis not present

## 2017-10-30 DIAGNOSIS — N186 End stage renal disease: Secondary | ICD-10-CM | POA: Diagnosis not present

## 2017-10-30 DIAGNOSIS — N2581 Secondary hyperparathyroidism of renal origin: Secondary | ICD-10-CM | POA: Diagnosis not present

## 2017-11-02 DIAGNOSIS — N186 End stage renal disease: Secondary | ICD-10-CM | POA: Diagnosis not present

## 2017-11-02 DIAGNOSIS — N2581 Secondary hyperparathyroidism of renal origin: Secondary | ICD-10-CM | POA: Diagnosis not present

## 2017-11-05 DIAGNOSIS — N186 End stage renal disease: Secondary | ICD-10-CM | POA: Diagnosis not present

## 2017-11-05 DIAGNOSIS — Z992 Dependence on renal dialysis: Secondary | ICD-10-CM | POA: Diagnosis not present

## 2017-11-05 DIAGNOSIS — I129 Hypertensive chronic kidney disease with stage 1 through stage 4 chronic kidney disease, or unspecified chronic kidney disease: Secondary | ICD-10-CM | POA: Diagnosis not present

## 2017-11-06 DIAGNOSIS — N2581 Secondary hyperparathyroidism of renal origin: Secondary | ICD-10-CM | POA: Diagnosis not present

## 2017-11-06 DIAGNOSIS — D631 Anemia in chronic kidney disease: Secondary | ICD-10-CM | POA: Diagnosis not present

## 2017-11-06 DIAGNOSIS — N186 End stage renal disease: Secondary | ICD-10-CM | POA: Diagnosis not present

## 2017-11-09 DIAGNOSIS — N2581 Secondary hyperparathyroidism of renal origin: Secondary | ICD-10-CM | POA: Diagnosis not present

## 2017-11-09 DIAGNOSIS — D631 Anemia in chronic kidney disease: Secondary | ICD-10-CM | POA: Diagnosis not present

## 2017-11-09 DIAGNOSIS — N186 End stage renal disease: Secondary | ICD-10-CM | POA: Diagnosis not present

## 2017-11-13 DIAGNOSIS — D631 Anemia in chronic kidney disease: Secondary | ICD-10-CM | POA: Diagnosis not present

## 2017-11-13 DIAGNOSIS — N2581 Secondary hyperparathyroidism of renal origin: Secondary | ICD-10-CM | POA: Diagnosis not present

## 2017-11-13 DIAGNOSIS — N186 End stage renal disease: Secondary | ICD-10-CM | POA: Diagnosis not present

## 2017-11-16 DIAGNOSIS — N186 End stage renal disease: Secondary | ICD-10-CM | POA: Diagnosis not present

## 2017-11-16 DIAGNOSIS — N2581 Secondary hyperparathyroidism of renal origin: Secondary | ICD-10-CM | POA: Diagnosis not present

## 2017-11-16 DIAGNOSIS — D631 Anemia in chronic kidney disease: Secondary | ICD-10-CM | POA: Diagnosis not present

## 2017-11-18 DIAGNOSIS — N2581 Secondary hyperparathyroidism of renal origin: Secondary | ICD-10-CM | POA: Diagnosis not present

## 2017-11-18 DIAGNOSIS — N186 End stage renal disease: Secondary | ICD-10-CM | POA: Diagnosis not present

## 2017-11-18 DIAGNOSIS — D631 Anemia in chronic kidney disease: Secondary | ICD-10-CM | POA: Diagnosis not present

## 2017-11-23 DIAGNOSIS — D631 Anemia in chronic kidney disease: Secondary | ICD-10-CM | POA: Diagnosis not present

## 2017-11-23 DIAGNOSIS — N2581 Secondary hyperparathyroidism of renal origin: Secondary | ICD-10-CM | POA: Diagnosis not present

## 2017-11-23 DIAGNOSIS — N186 End stage renal disease: Secondary | ICD-10-CM | POA: Diagnosis not present

## 2017-11-27 DIAGNOSIS — N186 End stage renal disease: Secondary | ICD-10-CM | POA: Diagnosis not present

## 2017-11-27 DIAGNOSIS — D631 Anemia in chronic kidney disease: Secondary | ICD-10-CM | POA: Diagnosis not present

## 2017-11-27 DIAGNOSIS — N2581 Secondary hyperparathyroidism of renal origin: Secondary | ICD-10-CM | POA: Diagnosis not present

## 2017-12-02 DIAGNOSIS — D631 Anemia in chronic kidney disease: Secondary | ICD-10-CM | POA: Diagnosis not present

## 2017-12-02 DIAGNOSIS — N186 End stage renal disease: Secondary | ICD-10-CM | POA: Diagnosis not present

## 2017-12-02 DIAGNOSIS — N2581 Secondary hyperparathyroidism of renal origin: Secondary | ICD-10-CM | POA: Diagnosis not present

## 2017-12-04 DIAGNOSIS — N186 End stage renal disease: Secondary | ICD-10-CM | POA: Diagnosis not present

## 2017-12-04 DIAGNOSIS — D631 Anemia in chronic kidney disease: Secondary | ICD-10-CM | POA: Diagnosis not present

## 2017-12-04 DIAGNOSIS — N2581 Secondary hyperparathyroidism of renal origin: Secondary | ICD-10-CM | POA: Diagnosis not present

## 2017-12-06 DIAGNOSIS — N186 End stage renal disease: Secondary | ICD-10-CM | POA: Diagnosis not present

## 2017-12-06 DIAGNOSIS — I129 Hypertensive chronic kidney disease with stage 1 through stage 4 chronic kidney disease, or unspecified chronic kidney disease: Secondary | ICD-10-CM | POA: Diagnosis not present

## 2017-12-06 DIAGNOSIS — Z992 Dependence on renal dialysis: Secondary | ICD-10-CM | POA: Diagnosis not present

## 2017-12-09 DIAGNOSIS — N2581 Secondary hyperparathyroidism of renal origin: Secondary | ICD-10-CM | POA: Diagnosis not present

## 2017-12-09 DIAGNOSIS — N186 End stage renal disease: Secondary | ICD-10-CM | POA: Diagnosis not present

## 2017-12-09 DIAGNOSIS — D509 Iron deficiency anemia, unspecified: Secondary | ICD-10-CM | POA: Diagnosis not present

## 2017-12-09 DIAGNOSIS — D631 Anemia in chronic kidney disease: Secondary | ICD-10-CM | POA: Diagnosis not present

## 2017-12-12 DIAGNOSIS — D509 Iron deficiency anemia, unspecified: Secondary | ICD-10-CM | POA: Diagnosis not present

## 2017-12-12 DIAGNOSIS — D631 Anemia in chronic kidney disease: Secondary | ICD-10-CM | POA: Diagnosis not present

## 2017-12-12 DIAGNOSIS — N2581 Secondary hyperparathyroidism of renal origin: Secondary | ICD-10-CM | POA: Diagnosis not present

## 2017-12-12 DIAGNOSIS — N186 End stage renal disease: Secondary | ICD-10-CM | POA: Diagnosis not present

## 2017-12-14 DIAGNOSIS — D509 Iron deficiency anemia, unspecified: Secondary | ICD-10-CM | POA: Diagnosis not present

## 2017-12-14 DIAGNOSIS — N186 End stage renal disease: Secondary | ICD-10-CM | POA: Diagnosis not present

## 2017-12-14 DIAGNOSIS — N2581 Secondary hyperparathyroidism of renal origin: Secondary | ICD-10-CM | POA: Diagnosis not present

## 2017-12-14 DIAGNOSIS — D631 Anemia in chronic kidney disease: Secondary | ICD-10-CM | POA: Diagnosis not present

## 2017-12-19 DIAGNOSIS — D509 Iron deficiency anemia, unspecified: Secondary | ICD-10-CM | POA: Diagnosis not present

## 2017-12-19 DIAGNOSIS — N2581 Secondary hyperparathyroidism of renal origin: Secondary | ICD-10-CM | POA: Diagnosis not present

## 2017-12-19 DIAGNOSIS — D631 Anemia in chronic kidney disease: Secondary | ICD-10-CM | POA: Diagnosis not present

## 2017-12-19 DIAGNOSIS — N186 End stage renal disease: Secondary | ICD-10-CM | POA: Diagnosis not present

## 2017-12-21 DIAGNOSIS — D631 Anemia in chronic kidney disease: Secondary | ICD-10-CM | POA: Diagnosis not present

## 2017-12-21 DIAGNOSIS — D509 Iron deficiency anemia, unspecified: Secondary | ICD-10-CM | POA: Diagnosis not present

## 2017-12-21 DIAGNOSIS — N2581 Secondary hyperparathyroidism of renal origin: Secondary | ICD-10-CM | POA: Diagnosis not present

## 2017-12-21 DIAGNOSIS — N186 End stage renal disease: Secondary | ICD-10-CM | POA: Diagnosis not present

## 2017-12-23 DIAGNOSIS — N2581 Secondary hyperparathyroidism of renal origin: Secondary | ICD-10-CM | POA: Diagnosis not present

## 2017-12-23 DIAGNOSIS — D509 Iron deficiency anemia, unspecified: Secondary | ICD-10-CM | POA: Diagnosis not present

## 2017-12-23 DIAGNOSIS — D631 Anemia in chronic kidney disease: Secondary | ICD-10-CM | POA: Diagnosis not present

## 2017-12-23 DIAGNOSIS — N186 End stage renal disease: Secondary | ICD-10-CM | POA: Diagnosis not present

## 2017-12-28 DIAGNOSIS — N186 End stage renal disease: Secondary | ICD-10-CM | POA: Diagnosis not present

## 2017-12-28 DIAGNOSIS — N2581 Secondary hyperparathyroidism of renal origin: Secondary | ICD-10-CM | POA: Diagnosis not present

## 2017-12-28 DIAGNOSIS — D509 Iron deficiency anemia, unspecified: Secondary | ICD-10-CM | POA: Diagnosis not present

## 2017-12-28 DIAGNOSIS — D631 Anemia in chronic kidney disease: Secondary | ICD-10-CM | POA: Diagnosis not present

## 2018-01-01 DIAGNOSIS — N2581 Secondary hyperparathyroidism of renal origin: Secondary | ICD-10-CM | POA: Diagnosis not present

## 2018-01-01 DIAGNOSIS — D631 Anemia in chronic kidney disease: Secondary | ICD-10-CM | POA: Diagnosis not present

## 2018-01-01 DIAGNOSIS — D509 Iron deficiency anemia, unspecified: Secondary | ICD-10-CM | POA: Diagnosis not present

## 2018-01-01 DIAGNOSIS — N186 End stage renal disease: Secondary | ICD-10-CM | POA: Diagnosis not present

## 2018-01-02 DIAGNOSIS — D509 Iron deficiency anemia, unspecified: Secondary | ICD-10-CM | POA: Diagnosis not present

## 2018-01-02 DIAGNOSIS — D631 Anemia in chronic kidney disease: Secondary | ICD-10-CM | POA: Diagnosis not present

## 2018-01-02 DIAGNOSIS — N2581 Secondary hyperparathyroidism of renal origin: Secondary | ICD-10-CM | POA: Diagnosis not present

## 2018-01-02 DIAGNOSIS — N186 End stage renal disease: Secondary | ICD-10-CM | POA: Diagnosis not present

## 2018-01-04 DIAGNOSIS — D631 Anemia in chronic kidney disease: Secondary | ICD-10-CM | POA: Diagnosis not present

## 2018-01-04 DIAGNOSIS — N2581 Secondary hyperparathyroidism of renal origin: Secondary | ICD-10-CM | POA: Diagnosis not present

## 2018-01-04 DIAGNOSIS — D509 Iron deficiency anemia, unspecified: Secondary | ICD-10-CM | POA: Diagnosis not present

## 2018-01-04 DIAGNOSIS — N186 End stage renal disease: Secondary | ICD-10-CM | POA: Diagnosis not present

## 2018-01-05 DIAGNOSIS — Z992 Dependence on renal dialysis: Secondary | ICD-10-CM | POA: Diagnosis not present

## 2018-01-05 DIAGNOSIS — I129 Hypertensive chronic kidney disease with stage 1 through stage 4 chronic kidney disease, or unspecified chronic kidney disease: Secondary | ICD-10-CM | POA: Diagnosis not present

## 2018-01-05 DIAGNOSIS — N186 End stage renal disease: Secondary | ICD-10-CM | POA: Diagnosis not present

## 2018-01-06 DIAGNOSIS — Z23 Encounter for immunization: Secondary | ICD-10-CM | POA: Diagnosis not present

## 2018-01-06 DIAGNOSIS — N186 End stage renal disease: Secondary | ICD-10-CM | POA: Diagnosis not present

## 2018-01-06 DIAGNOSIS — D631 Anemia in chronic kidney disease: Secondary | ICD-10-CM | POA: Diagnosis not present

## 2018-01-06 DIAGNOSIS — D509 Iron deficiency anemia, unspecified: Secondary | ICD-10-CM | POA: Diagnosis not present

## 2018-01-06 DIAGNOSIS — N2581 Secondary hyperparathyroidism of renal origin: Secondary | ICD-10-CM | POA: Diagnosis not present

## 2018-01-08 DIAGNOSIS — Z23 Encounter for immunization: Secondary | ICD-10-CM | POA: Diagnosis not present

## 2018-01-08 DIAGNOSIS — N186 End stage renal disease: Secondary | ICD-10-CM | POA: Diagnosis not present

## 2018-01-08 DIAGNOSIS — D631 Anemia in chronic kidney disease: Secondary | ICD-10-CM | POA: Diagnosis not present

## 2018-01-08 DIAGNOSIS — D509 Iron deficiency anemia, unspecified: Secondary | ICD-10-CM | POA: Diagnosis not present

## 2018-01-08 DIAGNOSIS — N2581 Secondary hyperparathyroidism of renal origin: Secondary | ICD-10-CM | POA: Diagnosis not present

## 2018-01-12 DIAGNOSIS — Z23 Encounter for immunization: Secondary | ICD-10-CM | POA: Diagnosis not present

## 2018-01-12 DIAGNOSIS — N186 End stage renal disease: Secondary | ICD-10-CM | POA: Diagnosis not present

## 2018-01-12 DIAGNOSIS — D631 Anemia in chronic kidney disease: Secondary | ICD-10-CM | POA: Diagnosis not present

## 2018-01-12 DIAGNOSIS — D509 Iron deficiency anemia, unspecified: Secondary | ICD-10-CM | POA: Diagnosis not present

## 2018-01-12 DIAGNOSIS — N2581 Secondary hyperparathyroidism of renal origin: Secondary | ICD-10-CM | POA: Diagnosis not present

## 2018-01-15 DIAGNOSIS — D631 Anemia in chronic kidney disease: Secondary | ICD-10-CM | POA: Diagnosis not present

## 2018-01-15 DIAGNOSIS — Z23 Encounter for immunization: Secondary | ICD-10-CM | POA: Diagnosis not present

## 2018-01-15 DIAGNOSIS — N186 End stage renal disease: Secondary | ICD-10-CM | POA: Diagnosis not present

## 2018-01-15 DIAGNOSIS — D509 Iron deficiency anemia, unspecified: Secondary | ICD-10-CM | POA: Diagnosis not present

## 2018-01-15 DIAGNOSIS — N2581 Secondary hyperparathyroidism of renal origin: Secondary | ICD-10-CM | POA: Diagnosis not present

## 2018-01-18 DIAGNOSIS — N2581 Secondary hyperparathyroidism of renal origin: Secondary | ICD-10-CM | POA: Diagnosis not present

## 2018-01-18 DIAGNOSIS — N186 End stage renal disease: Secondary | ICD-10-CM | POA: Diagnosis not present

## 2018-01-18 DIAGNOSIS — Z23 Encounter for immunization: Secondary | ICD-10-CM | POA: Diagnosis not present

## 2018-01-18 DIAGNOSIS — D631 Anemia in chronic kidney disease: Secondary | ICD-10-CM | POA: Diagnosis not present

## 2018-01-18 DIAGNOSIS — D509 Iron deficiency anemia, unspecified: Secondary | ICD-10-CM | POA: Diagnosis not present

## 2018-01-20 DIAGNOSIS — N2581 Secondary hyperparathyroidism of renal origin: Secondary | ICD-10-CM | POA: Diagnosis not present

## 2018-01-20 DIAGNOSIS — D631 Anemia in chronic kidney disease: Secondary | ICD-10-CM | POA: Diagnosis not present

## 2018-01-20 DIAGNOSIS — N186 End stage renal disease: Secondary | ICD-10-CM | POA: Diagnosis not present

## 2018-01-20 DIAGNOSIS — D509 Iron deficiency anemia, unspecified: Secondary | ICD-10-CM | POA: Diagnosis not present

## 2018-01-20 DIAGNOSIS — Z23 Encounter for immunization: Secondary | ICD-10-CM | POA: Diagnosis not present

## 2018-01-21 DIAGNOSIS — N186 End stage renal disease: Secondary | ICD-10-CM | POA: Diagnosis not present

## 2018-01-21 DIAGNOSIS — Z992 Dependence on renal dialysis: Secondary | ICD-10-CM | POA: Diagnosis not present

## 2018-01-21 DIAGNOSIS — T82898A Other specified complication of vascular prosthetic devices, implants and grafts, initial encounter: Secondary | ICD-10-CM | POA: Diagnosis not present

## 2018-01-22 DIAGNOSIS — D631 Anemia in chronic kidney disease: Secondary | ICD-10-CM | POA: Diagnosis not present

## 2018-01-22 DIAGNOSIS — N2581 Secondary hyperparathyroidism of renal origin: Secondary | ICD-10-CM | POA: Diagnosis not present

## 2018-01-22 DIAGNOSIS — Z23 Encounter for immunization: Secondary | ICD-10-CM | POA: Diagnosis not present

## 2018-01-22 DIAGNOSIS — D509 Iron deficiency anemia, unspecified: Secondary | ICD-10-CM | POA: Diagnosis not present

## 2018-01-22 DIAGNOSIS — N186 End stage renal disease: Secondary | ICD-10-CM | POA: Diagnosis not present

## 2018-01-25 DIAGNOSIS — N186 End stage renal disease: Secondary | ICD-10-CM | POA: Diagnosis not present

## 2018-01-25 DIAGNOSIS — D509 Iron deficiency anemia, unspecified: Secondary | ICD-10-CM | POA: Diagnosis not present

## 2018-01-25 DIAGNOSIS — Z23 Encounter for immunization: Secondary | ICD-10-CM | POA: Diagnosis not present

## 2018-01-25 DIAGNOSIS — D631 Anemia in chronic kidney disease: Secondary | ICD-10-CM | POA: Diagnosis not present

## 2018-01-25 DIAGNOSIS — N2581 Secondary hyperparathyroidism of renal origin: Secondary | ICD-10-CM | POA: Diagnosis not present

## 2018-01-29 DIAGNOSIS — N2581 Secondary hyperparathyroidism of renal origin: Secondary | ICD-10-CM | POA: Diagnosis not present

## 2018-01-29 DIAGNOSIS — D631 Anemia in chronic kidney disease: Secondary | ICD-10-CM | POA: Diagnosis not present

## 2018-01-29 DIAGNOSIS — Z23 Encounter for immunization: Secondary | ICD-10-CM | POA: Diagnosis not present

## 2018-01-29 DIAGNOSIS — N186 End stage renal disease: Secondary | ICD-10-CM | POA: Diagnosis not present

## 2018-01-29 DIAGNOSIS — D509 Iron deficiency anemia, unspecified: Secondary | ICD-10-CM | POA: Diagnosis not present

## 2018-02-01 DIAGNOSIS — D509 Iron deficiency anemia, unspecified: Secondary | ICD-10-CM | POA: Diagnosis not present

## 2018-02-01 DIAGNOSIS — N186 End stage renal disease: Secondary | ICD-10-CM | POA: Diagnosis not present

## 2018-02-01 DIAGNOSIS — Z23 Encounter for immunization: Secondary | ICD-10-CM | POA: Diagnosis not present

## 2018-02-01 DIAGNOSIS — D631 Anemia in chronic kidney disease: Secondary | ICD-10-CM | POA: Diagnosis not present

## 2018-02-01 DIAGNOSIS — N2581 Secondary hyperparathyroidism of renal origin: Secondary | ICD-10-CM | POA: Diagnosis not present

## 2018-02-03 DIAGNOSIS — N186 End stage renal disease: Secondary | ICD-10-CM | POA: Diagnosis not present

## 2018-02-03 DIAGNOSIS — D509 Iron deficiency anemia, unspecified: Secondary | ICD-10-CM | POA: Diagnosis not present

## 2018-02-03 DIAGNOSIS — Z23 Encounter for immunization: Secondary | ICD-10-CM | POA: Diagnosis not present

## 2018-02-03 DIAGNOSIS — N2581 Secondary hyperparathyroidism of renal origin: Secondary | ICD-10-CM | POA: Diagnosis not present

## 2018-02-03 DIAGNOSIS — D631 Anemia in chronic kidney disease: Secondary | ICD-10-CM | POA: Diagnosis not present

## 2018-02-05 DIAGNOSIS — Z992 Dependence on renal dialysis: Secondary | ICD-10-CM | POA: Diagnosis not present

## 2018-02-05 DIAGNOSIS — I129 Hypertensive chronic kidney disease with stage 1 through stage 4 chronic kidney disease, or unspecified chronic kidney disease: Secondary | ICD-10-CM | POA: Diagnosis not present

## 2018-02-05 DIAGNOSIS — N186 End stage renal disease: Secondary | ICD-10-CM | POA: Diagnosis not present

## 2018-02-06 DIAGNOSIS — N186 End stage renal disease: Secondary | ICD-10-CM | POA: Diagnosis not present

## 2018-02-06 DIAGNOSIS — N2581 Secondary hyperparathyroidism of renal origin: Secondary | ICD-10-CM | POA: Diagnosis not present

## 2018-02-08 DIAGNOSIS — N2581 Secondary hyperparathyroidism of renal origin: Secondary | ICD-10-CM | POA: Diagnosis not present

## 2018-02-08 DIAGNOSIS — N186 End stage renal disease: Secondary | ICD-10-CM | POA: Diagnosis not present

## 2018-02-10 DIAGNOSIS — N2581 Secondary hyperparathyroidism of renal origin: Secondary | ICD-10-CM | POA: Diagnosis not present

## 2018-02-10 DIAGNOSIS — N186 End stage renal disease: Secondary | ICD-10-CM | POA: Diagnosis not present

## 2018-02-12 DIAGNOSIS — N2581 Secondary hyperparathyroidism of renal origin: Secondary | ICD-10-CM | POA: Diagnosis not present

## 2018-02-12 DIAGNOSIS — N186 End stage renal disease: Secondary | ICD-10-CM | POA: Diagnosis not present

## 2018-02-17 DIAGNOSIS — N186 End stage renal disease: Secondary | ICD-10-CM | POA: Diagnosis not present

## 2018-02-17 DIAGNOSIS — N2581 Secondary hyperparathyroidism of renal origin: Secondary | ICD-10-CM | POA: Diagnosis not present

## 2018-02-19 DIAGNOSIS — N2581 Secondary hyperparathyroidism of renal origin: Secondary | ICD-10-CM | POA: Diagnosis not present

## 2018-02-19 DIAGNOSIS — N186 End stage renal disease: Secondary | ICD-10-CM | POA: Diagnosis not present

## 2018-02-22 DIAGNOSIS — N186 End stage renal disease: Secondary | ICD-10-CM | POA: Diagnosis not present

## 2018-02-22 DIAGNOSIS — N2581 Secondary hyperparathyroidism of renal origin: Secondary | ICD-10-CM | POA: Diagnosis not present

## 2018-02-26 DIAGNOSIS — N2581 Secondary hyperparathyroidism of renal origin: Secondary | ICD-10-CM | POA: Diagnosis not present

## 2018-02-26 DIAGNOSIS — N186 End stage renal disease: Secondary | ICD-10-CM | POA: Diagnosis not present

## 2018-02-28 DIAGNOSIS — N186 End stage renal disease: Secondary | ICD-10-CM | POA: Diagnosis not present

## 2018-02-28 DIAGNOSIS — N2581 Secondary hyperparathyroidism of renal origin: Secondary | ICD-10-CM | POA: Diagnosis not present

## 2018-03-05 DIAGNOSIS — N2581 Secondary hyperparathyroidism of renal origin: Secondary | ICD-10-CM | POA: Diagnosis not present

## 2018-03-05 DIAGNOSIS — N186 End stage renal disease: Secondary | ICD-10-CM | POA: Diagnosis not present

## 2018-03-07 DIAGNOSIS — N186 End stage renal disease: Secondary | ICD-10-CM | POA: Diagnosis not present

## 2018-03-07 DIAGNOSIS — I129 Hypertensive chronic kidney disease with stage 1 through stage 4 chronic kidney disease, or unspecified chronic kidney disease: Secondary | ICD-10-CM | POA: Diagnosis not present

## 2018-03-07 DIAGNOSIS — Z992 Dependence on renal dialysis: Secondary | ICD-10-CM | POA: Diagnosis not present

## 2018-03-08 DIAGNOSIS — D509 Iron deficiency anemia, unspecified: Secondary | ICD-10-CM | POA: Diagnosis not present

## 2018-03-08 DIAGNOSIS — N2581 Secondary hyperparathyroidism of renal origin: Secondary | ICD-10-CM | POA: Diagnosis not present

## 2018-03-08 DIAGNOSIS — N186 End stage renal disease: Secondary | ICD-10-CM | POA: Diagnosis not present

## 2018-03-12 DIAGNOSIS — D509 Iron deficiency anemia, unspecified: Secondary | ICD-10-CM | POA: Diagnosis not present

## 2018-03-12 DIAGNOSIS — N2581 Secondary hyperparathyroidism of renal origin: Secondary | ICD-10-CM | POA: Diagnosis not present

## 2018-03-12 DIAGNOSIS — N186 End stage renal disease: Secondary | ICD-10-CM | POA: Diagnosis not present

## 2018-03-15 DIAGNOSIS — Z992 Dependence on renal dialysis: Secondary | ICD-10-CM | POA: Diagnosis not present

## 2018-03-15 DIAGNOSIS — N185 Chronic kidney disease, stage 5: Secondary | ICD-10-CM | POA: Diagnosis not present

## 2018-03-15 DIAGNOSIS — I120 Hypertensive chronic kidney disease with stage 5 chronic kidney disease or end stage renal disease: Secondary | ICD-10-CM | POA: Diagnosis not present

## 2018-03-17 DIAGNOSIS — N186 End stage renal disease: Secondary | ICD-10-CM | POA: Diagnosis not present

## 2018-03-17 DIAGNOSIS — N2581 Secondary hyperparathyroidism of renal origin: Secondary | ICD-10-CM | POA: Diagnosis not present

## 2018-03-17 DIAGNOSIS — D509 Iron deficiency anemia, unspecified: Secondary | ICD-10-CM | POA: Diagnosis not present

## 2018-03-19 DIAGNOSIS — N186 End stage renal disease: Secondary | ICD-10-CM | POA: Diagnosis not present

## 2018-03-19 DIAGNOSIS — N2581 Secondary hyperparathyroidism of renal origin: Secondary | ICD-10-CM | POA: Diagnosis not present

## 2018-03-19 DIAGNOSIS — D509 Iron deficiency anemia, unspecified: Secondary | ICD-10-CM | POA: Diagnosis not present

## 2018-03-22 DIAGNOSIS — D509 Iron deficiency anemia, unspecified: Secondary | ICD-10-CM | POA: Diagnosis not present

## 2018-03-22 DIAGNOSIS — N186 End stage renal disease: Secondary | ICD-10-CM | POA: Diagnosis not present

## 2018-03-22 DIAGNOSIS — N2581 Secondary hyperparathyroidism of renal origin: Secondary | ICD-10-CM | POA: Diagnosis not present

## 2018-03-26 DIAGNOSIS — D509 Iron deficiency anemia, unspecified: Secondary | ICD-10-CM | POA: Diagnosis not present

## 2018-03-26 DIAGNOSIS — N2581 Secondary hyperparathyroidism of renal origin: Secondary | ICD-10-CM | POA: Diagnosis not present

## 2018-03-26 DIAGNOSIS — N186 End stage renal disease: Secondary | ICD-10-CM | POA: Diagnosis not present

## 2018-03-28 DIAGNOSIS — D509 Iron deficiency anemia, unspecified: Secondary | ICD-10-CM | POA: Diagnosis not present

## 2018-03-28 DIAGNOSIS — N2581 Secondary hyperparathyroidism of renal origin: Secondary | ICD-10-CM | POA: Diagnosis not present

## 2018-03-28 DIAGNOSIS — N186 End stage renal disease: Secondary | ICD-10-CM | POA: Diagnosis not present

## 2018-04-02 DIAGNOSIS — D509 Iron deficiency anemia, unspecified: Secondary | ICD-10-CM | POA: Diagnosis not present

## 2018-04-02 DIAGNOSIS — N186 End stage renal disease: Secondary | ICD-10-CM | POA: Diagnosis not present

## 2018-04-02 DIAGNOSIS — N2581 Secondary hyperparathyroidism of renal origin: Secondary | ICD-10-CM | POA: Diagnosis not present

## 2018-04-09 DIAGNOSIS — D509 Iron deficiency anemia, unspecified: Secondary | ICD-10-CM | POA: Diagnosis not present

## 2018-04-09 DIAGNOSIS — N2581 Secondary hyperparathyroidism of renal origin: Secondary | ICD-10-CM | POA: Diagnosis not present

## 2018-04-09 DIAGNOSIS — D631 Anemia in chronic kidney disease: Secondary | ICD-10-CM | POA: Diagnosis not present

## 2018-04-09 DIAGNOSIS — N186 End stage renal disease: Secondary | ICD-10-CM | POA: Diagnosis not present

## 2018-04-12 DIAGNOSIS — D509 Iron deficiency anemia, unspecified: Secondary | ICD-10-CM | POA: Diagnosis not present

## 2018-04-12 DIAGNOSIS — D631 Anemia in chronic kidney disease: Secondary | ICD-10-CM | POA: Diagnosis not present

## 2018-04-12 DIAGNOSIS — N2581 Secondary hyperparathyroidism of renal origin: Secondary | ICD-10-CM | POA: Diagnosis not present

## 2018-04-12 DIAGNOSIS — N186 End stage renal disease: Secondary | ICD-10-CM | POA: Diagnosis not present

## 2018-04-13 DIAGNOSIS — I120 Hypertensive chronic kidney disease with stage 5 chronic kidney disease or end stage renal disease: Secondary | ICD-10-CM | POA: Diagnosis not present

## 2018-04-13 DIAGNOSIS — Z992 Dependence on renal dialysis: Secondary | ICD-10-CM | POA: Diagnosis not present

## 2018-04-13 DIAGNOSIS — N186 End stage renal disease: Secondary | ICD-10-CM | POA: Diagnosis not present

## 2018-04-13 DIAGNOSIS — Z4902 Encounter for fitting and adjustment of peritoneal dialysis catheter: Secondary | ICD-10-CM | POA: Diagnosis not present

## 2018-04-13 DIAGNOSIS — N185 Chronic kidney disease, stage 5: Secondary | ICD-10-CM | POA: Diagnosis not present

## 2018-04-13 DIAGNOSIS — Z01810 Encounter for preprocedural cardiovascular examination: Secondary | ICD-10-CM | POA: Diagnosis not present

## 2018-04-14 DIAGNOSIS — D631 Anemia in chronic kidney disease: Secondary | ICD-10-CM | POA: Diagnosis not present

## 2018-04-14 DIAGNOSIS — N2581 Secondary hyperparathyroidism of renal origin: Secondary | ICD-10-CM | POA: Diagnosis not present

## 2018-04-14 DIAGNOSIS — D509 Iron deficiency anemia, unspecified: Secondary | ICD-10-CM | POA: Diagnosis not present

## 2018-04-14 DIAGNOSIS — N186 End stage renal disease: Secondary | ICD-10-CM | POA: Diagnosis not present

## 2018-04-17 DIAGNOSIS — D631 Anemia in chronic kidney disease: Secondary | ICD-10-CM | POA: Diagnosis not present

## 2018-04-17 DIAGNOSIS — N186 End stage renal disease: Secondary | ICD-10-CM | POA: Diagnosis not present

## 2018-04-17 DIAGNOSIS — N2581 Secondary hyperparathyroidism of renal origin: Secondary | ICD-10-CM | POA: Diagnosis not present

## 2018-04-17 DIAGNOSIS — D509 Iron deficiency anemia, unspecified: Secondary | ICD-10-CM | POA: Diagnosis not present

## 2018-04-21 DIAGNOSIS — D631 Anemia in chronic kidney disease: Secondary | ICD-10-CM | POA: Diagnosis not present

## 2018-04-21 DIAGNOSIS — N186 End stage renal disease: Secondary | ICD-10-CM | POA: Diagnosis not present

## 2018-04-21 DIAGNOSIS — D509 Iron deficiency anemia, unspecified: Secondary | ICD-10-CM | POA: Diagnosis not present

## 2018-04-21 DIAGNOSIS — N2581 Secondary hyperparathyroidism of renal origin: Secondary | ICD-10-CM | POA: Diagnosis not present

## 2018-04-23 DIAGNOSIS — D509 Iron deficiency anemia, unspecified: Secondary | ICD-10-CM | POA: Diagnosis not present

## 2018-04-23 DIAGNOSIS — N186 End stage renal disease: Secondary | ICD-10-CM | POA: Diagnosis not present

## 2018-04-23 DIAGNOSIS — N2581 Secondary hyperparathyroidism of renal origin: Secondary | ICD-10-CM | POA: Diagnosis not present

## 2018-04-23 DIAGNOSIS — D631 Anemia in chronic kidney disease: Secondary | ICD-10-CM | POA: Diagnosis not present

## 2018-04-26 DIAGNOSIS — N2581 Secondary hyperparathyroidism of renal origin: Secondary | ICD-10-CM | POA: Diagnosis not present

## 2018-04-26 DIAGNOSIS — D509 Iron deficiency anemia, unspecified: Secondary | ICD-10-CM | POA: Diagnosis not present

## 2018-04-26 DIAGNOSIS — N186 End stage renal disease: Secondary | ICD-10-CM | POA: Diagnosis not present

## 2018-04-26 DIAGNOSIS — D631 Anemia in chronic kidney disease: Secondary | ICD-10-CM | POA: Diagnosis not present

## 2018-05-01 DIAGNOSIS — N186 End stage renal disease: Secondary | ICD-10-CM | POA: Diagnosis not present

## 2018-05-01 DIAGNOSIS — N2581 Secondary hyperparathyroidism of renal origin: Secondary | ICD-10-CM | POA: Diagnosis not present

## 2018-05-01 DIAGNOSIS — D631 Anemia in chronic kidney disease: Secondary | ICD-10-CM | POA: Diagnosis not present

## 2018-05-01 DIAGNOSIS — D509 Iron deficiency anemia, unspecified: Secondary | ICD-10-CM | POA: Diagnosis not present

## 2018-05-03 DIAGNOSIS — N186 End stage renal disease: Secondary | ICD-10-CM | POA: Diagnosis not present

## 2018-05-03 DIAGNOSIS — N2581 Secondary hyperparathyroidism of renal origin: Secondary | ICD-10-CM | POA: Diagnosis not present

## 2018-05-03 DIAGNOSIS — D509 Iron deficiency anemia, unspecified: Secondary | ICD-10-CM | POA: Diagnosis not present

## 2018-05-03 DIAGNOSIS — N2589 Other disorders resulting from impaired renal tubular function: Secondary | ICD-10-CM | POA: Diagnosis not present

## 2018-05-03 DIAGNOSIS — D631 Anemia in chronic kidney disease: Secondary | ICD-10-CM | POA: Diagnosis not present

## 2018-05-03 DIAGNOSIS — K769 Liver disease, unspecified: Secondary | ICD-10-CM | POA: Diagnosis not present

## 2018-05-04 DIAGNOSIS — N2589 Other disorders resulting from impaired renal tubular function: Secondary | ICD-10-CM | POA: Diagnosis not present

## 2018-05-04 DIAGNOSIS — K769 Liver disease, unspecified: Secondary | ICD-10-CM | POA: Diagnosis not present

## 2018-05-04 DIAGNOSIS — N2581 Secondary hyperparathyroidism of renal origin: Secondary | ICD-10-CM | POA: Diagnosis not present

## 2018-05-04 DIAGNOSIS — E7849 Other hyperlipidemia: Secondary | ICD-10-CM | POA: Diagnosis not present

## 2018-05-04 DIAGNOSIS — R82998 Other abnormal findings in urine: Secondary | ICD-10-CM | POA: Diagnosis not present

## 2018-05-04 DIAGNOSIS — E7841 Elevated Lipoprotein(a): Secondary | ICD-10-CM | POA: Diagnosis not present

## 2018-05-04 DIAGNOSIS — D631 Anemia in chronic kidney disease: Secondary | ICD-10-CM | POA: Diagnosis not present

## 2018-05-04 DIAGNOSIS — D509 Iron deficiency anemia, unspecified: Secondary | ICD-10-CM | POA: Diagnosis not present

## 2018-05-04 DIAGNOSIS — N186 End stage renal disease: Secondary | ICD-10-CM | POA: Diagnosis not present

## 2018-05-05 DIAGNOSIS — K769 Liver disease, unspecified: Secondary | ICD-10-CM | POA: Diagnosis not present

## 2018-05-05 DIAGNOSIS — N2589 Other disorders resulting from impaired renal tubular function: Secondary | ICD-10-CM | POA: Diagnosis not present

## 2018-05-05 DIAGNOSIS — N186 End stage renal disease: Secondary | ICD-10-CM | POA: Diagnosis not present

## 2018-05-05 DIAGNOSIS — D631 Anemia in chronic kidney disease: Secondary | ICD-10-CM | POA: Diagnosis not present

## 2018-05-05 DIAGNOSIS — D509 Iron deficiency anemia, unspecified: Secondary | ICD-10-CM | POA: Diagnosis not present

## 2018-05-05 DIAGNOSIS — N2581 Secondary hyperparathyroidism of renal origin: Secondary | ICD-10-CM | POA: Diagnosis not present

## 2018-05-06 DIAGNOSIS — D509 Iron deficiency anemia, unspecified: Secondary | ICD-10-CM | POA: Diagnosis not present

## 2018-05-06 DIAGNOSIS — D631 Anemia in chronic kidney disease: Secondary | ICD-10-CM | POA: Diagnosis not present

## 2018-05-06 DIAGNOSIS — N2581 Secondary hyperparathyroidism of renal origin: Secondary | ICD-10-CM | POA: Diagnosis not present

## 2018-05-06 DIAGNOSIS — K769 Liver disease, unspecified: Secondary | ICD-10-CM | POA: Diagnosis not present

## 2018-05-06 DIAGNOSIS — N2589 Other disorders resulting from impaired renal tubular function: Secondary | ICD-10-CM | POA: Diagnosis not present

## 2018-05-06 DIAGNOSIS — N186 End stage renal disease: Secondary | ICD-10-CM | POA: Diagnosis not present

## 2018-05-07 DIAGNOSIS — N186 End stage renal disease: Secondary | ICD-10-CM | POA: Diagnosis not present

## 2018-05-07 DIAGNOSIS — N2581 Secondary hyperparathyroidism of renal origin: Secondary | ICD-10-CM | POA: Diagnosis not present

## 2018-05-07 DIAGNOSIS — I129 Hypertensive chronic kidney disease with stage 1 through stage 4 chronic kidney disease, or unspecified chronic kidney disease: Secondary | ICD-10-CM | POA: Diagnosis not present

## 2018-05-07 DIAGNOSIS — Z992 Dependence on renal dialysis: Secondary | ICD-10-CM | POA: Diagnosis not present

## 2018-05-08 DIAGNOSIS — Z992 Dependence on renal dialysis: Secondary | ICD-10-CM | POA: Diagnosis not present

## 2018-05-08 DIAGNOSIS — N186 End stage renal disease: Secondary | ICD-10-CM | POA: Diagnosis not present

## 2018-05-08 DIAGNOSIS — I129 Hypertensive chronic kidney disease with stage 1 through stage 4 chronic kidney disease, or unspecified chronic kidney disease: Secondary | ICD-10-CM | POA: Diagnosis not present

## 2018-05-10 DIAGNOSIS — D509 Iron deficiency anemia, unspecified: Secondary | ICD-10-CM | POA: Diagnosis not present

## 2018-05-10 DIAGNOSIS — D631 Anemia in chronic kidney disease: Secondary | ICD-10-CM | POA: Diagnosis not present

## 2018-05-10 DIAGNOSIS — N186 End stage renal disease: Secondary | ICD-10-CM | POA: Diagnosis not present

## 2018-05-10 DIAGNOSIS — N2581 Secondary hyperparathyroidism of renal origin: Secondary | ICD-10-CM | POA: Diagnosis not present

## 2018-05-10 DIAGNOSIS — N2589 Other disorders resulting from impaired renal tubular function: Secondary | ICD-10-CM | POA: Diagnosis not present

## 2018-05-10 DIAGNOSIS — K769 Liver disease, unspecified: Secondary | ICD-10-CM | POA: Diagnosis not present

## 2018-05-11 DIAGNOSIS — N186 End stage renal disease: Secondary | ICD-10-CM | POA: Diagnosis not present

## 2018-05-11 DIAGNOSIS — N2581 Secondary hyperparathyroidism of renal origin: Secondary | ICD-10-CM | POA: Diagnosis not present

## 2018-05-11 DIAGNOSIS — N2589 Other disorders resulting from impaired renal tubular function: Secondary | ICD-10-CM | POA: Diagnosis not present

## 2018-05-11 DIAGNOSIS — D631 Anemia in chronic kidney disease: Secondary | ICD-10-CM | POA: Diagnosis not present

## 2018-05-11 DIAGNOSIS — K769 Liver disease, unspecified: Secondary | ICD-10-CM | POA: Diagnosis not present

## 2018-05-11 DIAGNOSIS — D509 Iron deficiency anemia, unspecified: Secondary | ICD-10-CM | POA: Diagnosis not present

## 2018-05-11 DIAGNOSIS — E7841 Elevated Lipoprotein(a): Secondary | ICD-10-CM | POA: Diagnosis not present

## 2018-05-12 DIAGNOSIS — Z4932 Encounter for adequacy testing for peritoneal dialysis: Secondary | ICD-10-CM | POA: Diagnosis not present

## 2018-05-12 DIAGNOSIS — N2581 Secondary hyperparathyroidism of renal origin: Secondary | ICD-10-CM | POA: Diagnosis not present

## 2018-05-12 DIAGNOSIS — D631 Anemia in chronic kidney disease: Secondary | ICD-10-CM | POA: Diagnosis not present

## 2018-05-12 DIAGNOSIS — D509 Iron deficiency anemia, unspecified: Secondary | ICD-10-CM | POA: Diagnosis not present

## 2018-05-12 DIAGNOSIS — Z992 Dependence on renal dialysis: Secondary | ICD-10-CM | POA: Diagnosis not present

## 2018-05-12 DIAGNOSIS — N186 End stage renal disease: Secondary | ICD-10-CM | POA: Diagnosis not present

## 2018-05-12 DIAGNOSIS — E875 Hyperkalemia: Secondary | ICD-10-CM | POA: Diagnosis not present

## 2018-05-13 DIAGNOSIS — D509 Iron deficiency anemia, unspecified: Secondary | ICD-10-CM | POA: Diagnosis not present

## 2018-05-13 DIAGNOSIS — E875 Hyperkalemia: Secondary | ICD-10-CM | POA: Diagnosis not present

## 2018-05-13 DIAGNOSIS — D631 Anemia in chronic kidney disease: Secondary | ICD-10-CM | POA: Diagnosis not present

## 2018-05-13 DIAGNOSIS — N186 End stage renal disease: Secondary | ICD-10-CM | POA: Diagnosis not present

## 2018-05-13 DIAGNOSIS — N2581 Secondary hyperparathyroidism of renal origin: Secondary | ICD-10-CM | POA: Diagnosis not present

## 2018-05-13 DIAGNOSIS — Z4932 Encounter for adequacy testing for peritoneal dialysis: Secondary | ICD-10-CM | POA: Diagnosis not present

## 2018-05-14 DIAGNOSIS — D631 Anemia in chronic kidney disease: Secondary | ICD-10-CM | POA: Diagnosis not present

## 2018-05-14 DIAGNOSIS — Z4932 Encounter for adequacy testing for peritoneal dialysis: Secondary | ICD-10-CM | POA: Diagnosis not present

## 2018-05-14 DIAGNOSIS — N2581 Secondary hyperparathyroidism of renal origin: Secondary | ICD-10-CM | POA: Diagnosis not present

## 2018-05-14 DIAGNOSIS — D509 Iron deficiency anemia, unspecified: Secondary | ICD-10-CM | POA: Diagnosis not present

## 2018-05-14 DIAGNOSIS — E875 Hyperkalemia: Secondary | ICD-10-CM | POA: Diagnosis not present

## 2018-05-14 DIAGNOSIS — N186 End stage renal disease: Secondary | ICD-10-CM | POA: Diagnosis not present

## 2018-05-15 DIAGNOSIS — D631 Anemia in chronic kidney disease: Secondary | ICD-10-CM | POA: Diagnosis not present

## 2018-05-15 DIAGNOSIS — N2581 Secondary hyperparathyroidism of renal origin: Secondary | ICD-10-CM | POA: Diagnosis not present

## 2018-05-15 DIAGNOSIS — D509 Iron deficiency anemia, unspecified: Secondary | ICD-10-CM | POA: Diagnosis not present

## 2018-05-15 DIAGNOSIS — N186 End stage renal disease: Secondary | ICD-10-CM | POA: Diagnosis not present

## 2018-05-15 DIAGNOSIS — Z4932 Encounter for adequacy testing for peritoneal dialysis: Secondary | ICD-10-CM | POA: Diagnosis not present

## 2018-05-15 DIAGNOSIS — E875 Hyperkalemia: Secondary | ICD-10-CM | POA: Diagnosis not present

## 2018-05-16 DIAGNOSIS — D631 Anemia in chronic kidney disease: Secondary | ICD-10-CM | POA: Diagnosis not present

## 2018-05-16 DIAGNOSIS — D509 Iron deficiency anemia, unspecified: Secondary | ICD-10-CM | POA: Diagnosis not present

## 2018-05-16 DIAGNOSIS — Z4932 Encounter for adequacy testing for peritoneal dialysis: Secondary | ICD-10-CM | POA: Diagnosis not present

## 2018-05-16 DIAGNOSIS — N186 End stage renal disease: Secondary | ICD-10-CM | POA: Diagnosis not present

## 2018-05-16 DIAGNOSIS — N2581 Secondary hyperparathyroidism of renal origin: Secondary | ICD-10-CM | POA: Diagnosis not present

## 2018-05-16 DIAGNOSIS — E875 Hyperkalemia: Secondary | ICD-10-CM | POA: Diagnosis not present

## 2018-05-17 DIAGNOSIS — N186 End stage renal disease: Secondary | ICD-10-CM | POA: Diagnosis not present

## 2018-05-17 DIAGNOSIS — D509 Iron deficiency anemia, unspecified: Secondary | ICD-10-CM | POA: Diagnosis not present

## 2018-05-17 DIAGNOSIS — Z4932 Encounter for adequacy testing for peritoneal dialysis: Secondary | ICD-10-CM | POA: Diagnosis not present

## 2018-05-17 DIAGNOSIS — N2581 Secondary hyperparathyroidism of renal origin: Secondary | ICD-10-CM | POA: Diagnosis not present

## 2018-05-17 DIAGNOSIS — E875 Hyperkalemia: Secondary | ICD-10-CM | POA: Diagnosis not present

## 2018-05-17 DIAGNOSIS — D631 Anemia in chronic kidney disease: Secondary | ICD-10-CM | POA: Diagnosis not present

## 2018-05-18 DIAGNOSIS — Z4932 Encounter for adequacy testing for peritoneal dialysis: Secondary | ICD-10-CM | POA: Diagnosis not present

## 2018-05-18 DIAGNOSIS — D631 Anemia in chronic kidney disease: Secondary | ICD-10-CM | POA: Diagnosis not present

## 2018-05-18 DIAGNOSIS — N2581 Secondary hyperparathyroidism of renal origin: Secondary | ICD-10-CM | POA: Diagnosis not present

## 2018-05-18 DIAGNOSIS — D509 Iron deficiency anemia, unspecified: Secondary | ICD-10-CM | POA: Diagnosis not present

## 2018-05-18 DIAGNOSIS — E875 Hyperkalemia: Secondary | ICD-10-CM | POA: Diagnosis not present

## 2018-05-18 DIAGNOSIS — N186 End stage renal disease: Secondary | ICD-10-CM | POA: Diagnosis not present

## 2018-05-19 DIAGNOSIS — D631 Anemia in chronic kidney disease: Secondary | ICD-10-CM | POA: Diagnosis not present

## 2018-05-19 DIAGNOSIS — D509 Iron deficiency anemia, unspecified: Secondary | ICD-10-CM | POA: Diagnosis not present

## 2018-05-19 DIAGNOSIS — N186 End stage renal disease: Secondary | ICD-10-CM | POA: Diagnosis not present

## 2018-05-19 DIAGNOSIS — E875 Hyperkalemia: Secondary | ICD-10-CM | POA: Diagnosis not present

## 2018-05-19 DIAGNOSIS — N2581 Secondary hyperparathyroidism of renal origin: Secondary | ICD-10-CM | POA: Diagnosis not present

## 2018-05-19 DIAGNOSIS — Z4932 Encounter for adequacy testing for peritoneal dialysis: Secondary | ICD-10-CM | POA: Diagnosis not present

## 2018-05-20 DIAGNOSIS — N186 End stage renal disease: Secondary | ICD-10-CM | POA: Diagnosis not present

## 2018-05-20 DIAGNOSIS — Z4932 Encounter for adequacy testing for peritoneal dialysis: Secondary | ICD-10-CM | POA: Diagnosis not present

## 2018-05-20 DIAGNOSIS — N2581 Secondary hyperparathyroidism of renal origin: Secondary | ICD-10-CM | POA: Diagnosis not present

## 2018-05-20 DIAGNOSIS — D509 Iron deficiency anemia, unspecified: Secondary | ICD-10-CM | POA: Diagnosis not present

## 2018-05-20 DIAGNOSIS — D631 Anemia in chronic kidney disease: Secondary | ICD-10-CM | POA: Diagnosis not present

## 2018-05-20 DIAGNOSIS — E875 Hyperkalemia: Secondary | ICD-10-CM | POA: Diagnosis not present

## 2018-05-21 DIAGNOSIS — N2581 Secondary hyperparathyroidism of renal origin: Secondary | ICD-10-CM | POA: Diagnosis not present

## 2018-05-21 DIAGNOSIS — Z4932 Encounter for adequacy testing for peritoneal dialysis: Secondary | ICD-10-CM | POA: Diagnosis not present

## 2018-05-21 DIAGNOSIS — D631 Anemia in chronic kidney disease: Secondary | ICD-10-CM | POA: Diagnosis not present

## 2018-05-21 DIAGNOSIS — N186 End stage renal disease: Secondary | ICD-10-CM | POA: Diagnosis not present

## 2018-05-21 DIAGNOSIS — D509 Iron deficiency anemia, unspecified: Secondary | ICD-10-CM | POA: Diagnosis not present

## 2018-05-21 DIAGNOSIS — E875 Hyperkalemia: Secondary | ICD-10-CM | POA: Diagnosis not present

## 2018-05-22 DIAGNOSIS — Z4932 Encounter for adequacy testing for peritoneal dialysis: Secondary | ICD-10-CM | POA: Diagnosis not present

## 2018-05-22 DIAGNOSIS — D631 Anemia in chronic kidney disease: Secondary | ICD-10-CM | POA: Diagnosis not present

## 2018-05-22 DIAGNOSIS — E875 Hyperkalemia: Secondary | ICD-10-CM | POA: Diagnosis not present

## 2018-05-22 DIAGNOSIS — N2581 Secondary hyperparathyroidism of renal origin: Secondary | ICD-10-CM | POA: Diagnosis not present

## 2018-05-22 DIAGNOSIS — D509 Iron deficiency anemia, unspecified: Secondary | ICD-10-CM | POA: Diagnosis not present

## 2018-05-22 DIAGNOSIS — N186 End stage renal disease: Secondary | ICD-10-CM | POA: Diagnosis not present

## 2018-05-23 DIAGNOSIS — D631 Anemia in chronic kidney disease: Secondary | ICD-10-CM | POA: Diagnosis not present

## 2018-05-23 DIAGNOSIS — D509 Iron deficiency anemia, unspecified: Secondary | ICD-10-CM | POA: Diagnosis not present

## 2018-05-23 DIAGNOSIS — N2581 Secondary hyperparathyroidism of renal origin: Secondary | ICD-10-CM | POA: Diagnosis not present

## 2018-05-23 DIAGNOSIS — N186 End stage renal disease: Secondary | ICD-10-CM | POA: Diagnosis not present

## 2018-05-23 DIAGNOSIS — Z4932 Encounter for adequacy testing for peritoneal dialysis: Secondary | ICD-10-CM | POA: Diagnosis not present

## 2018-05-23 DIAGNOSIS — E875 Hyperkalemia: Secondary | ICD-10-CM | POA: Diagnosis not present

## 2018-05-24 DIAGNOSIS — D631 Anemia in chronic kidney disease: Secondary | ICD-10-CM | POA: Diagnosis not present

## 2018-05-24 DIAGNOSIS — Z4932 Encounter for adequacy testing for peritoneal dialysis: Secondary | ICD-10-CM | POA: Diagnosis not present

## 2018-05-24 DIAGNOSIS — E875 Hyperkalemia: Secondary | ICD-10-CM | POA: Diagnosis not present

## 2018-05-24 DIAGNOSIS — N2581 Secondary hyperparathyroidism of renal origin: Secondary | ICD-10-CM | POA: Diagnosis not present

## 2018-05-24 DIAGNOSIS — N186 End stage renal disease: Secondary | ICD-10-CM | POA: Diagnosis not present

## 2018-05-24 DIAGNOSIS — D509 Iron deficiency anemia, unspecified: Secondary | ICD-10-CM | POA: Diagnosis not present

## 2018-05-25 DIAGNOSIS — N2581 Secondary hyperparathyroidism of renal origin: Secondary | ICD-10-CM | POA: Diagnosis not present

## 2018-05-25 DIAGNOSIS — Z4932 Encounter for adequacy testing for peritoneal dialysis: Secondary | ICD-10-CM | POA: Diagnosis not present

## 2018-05-25 DIAGNOSIS — N186 End stage renal disease: Secondary | ICD-10-CM | POA: Diagnosis not present

## 2018-05-25 DIAGNOSIS — D509 Iron deficiency anemia, unspecified: Secondary | ICD-10-CM | POA: Diagnosis not present

## 2018-05-25 DIAGNOSIS — D631 Anemia in chronic kidney disease: Secondary | ICD-10-CM | POA: Diagnosis not present

## 2018-05-25 DIAGNOSIS — E875 Hyperkalemia: Secondary | ICD-10-CM | POA: Diagnosis not present

## 2018-05-26 DIAGNOSIS — N186 End stage renal disease: Secondary | ICD-10-CM | POA: Diagnosis not present

## 2018-05-26 DIAGNOSIS — D509 Iron deficiency anemia, unspecified: Secondary | ICD-10-CM | POA: Diagnosis not present

## 2018-05-26 DIAGNOSIS — D631 Anemia in chronic kidney disease: Secondary | ICD-10-CM | POA: Diagnosis not present

## 2018-05-26 DIAGNOSIS — N2581 Secondary hyperparathyroidism of renal origin: Secondary | ICD-10-CM | POA: Diagnosis not present

## 2018-05-26 DIAGNOSIS — Z4932 Encounter for adequacy testing for peritoneal dialysis: Secondary | ICD-10-CM | POA: Diagnosis not present

## 2018-05-26 DIAGNOSIS — E875 Hyperkalemia: Secondary | ICD-10-CM | POA: Diagnosis not present

## 2018-05-27 DIAGNOSIS — E875 Hyperkalemia: Secondary | ICD-10-CM | POA: Diagnosis not present

## 2018-05-27 DIAGNOSIS — N186 End stage renal disease: Secondary | ICD-10-CM | POA: Diagnosis not present

## 2018-05-27 DIAGNOSIS — D509 Iron deficiency anemia, unspecified: Secondary | ICD-10-CM | POA: Diagnosis not present

## 2018-05-27 DIAGNOSIS — D631 Anemia in chronic kidney disease: Secondary | ICD-10-CM | POA: Diagnosis not present

## 2018-05-27 DIAGNOSIS — N2581 Secondary hyperparathyroidism of renal origin: Secondary | ICD-10-CM | POA: Diagnosis not present

## 2018-05-27 DIAGNOSIS — Z4932 Encounter for adequacy testing for peritoneal dialysis: Secondary | ICD-10-CM | POA: Diagnosis not present

## 2018-05-28 DIAGNOSIS — E875 Hyperkalemia: Secondary | ICD-10-CM | POA: Diagnosis not present

## 2018-05-28 DIAGNOSIS — Z4932 Encounter for adequacy testing for peritoneal dialysis: Secondary | ICD-10-CM | POA: Diagnosis not present

## 2018-05-28 DIAGNOSIS — D631 Anemia in chronic kidney disease: Secondary | ICD-10-CM | POA: Diagnosis not present

## 2018-05-28 DIAGNOSIS — N186 End stage renal disease: Secondary | ICD-10-CM | POA: Diagnosis not present

## 2018-05-28 DIAGNOSIS — N2581 Secondary hyperparathyroidism of renal origin: Secondary | ICD-10-CM | POA: Diagnosis not present

## 2018-05-28 DIAGNOSIS — D509 Iron deficiency anemia, unspecified: Secondary | ICD-10-CM | POA: Diagnosis not present

## 2018-05-29 DIAGNOSIS — E875 Hyperkalemia: Secondary | ICD-10-CM | POA: Diagnosis not present

## 2018-05-29 DIAGNOSIS — Z4932 Encounter for adequacy testing for peritoneal dialysis: Secondary | ICD-10-CM | POA: Diagnosis not present

## 2018-05-29 DIAGNOSIS — N186 End stage renal disease: Secondary | ICD-10-CM | POA: Diagnosis not present

## 2018-05-29 DIAGNOSIS — N2581 Secondary hyperparathyroidism of renal origin: Secondary | ICD-10-CM | POA: Diagnosis not present

## 2018-05-29 DIAGNOSIS — D509 Iron deficiency anemia, unspecified: Secondary | ICD-10-CM | POA: Diagnosis not present

## 2018-05-29 DIAGNOSIS — D631 Anemia in chronic kidney disease: Secondary | ICD-10-CM | POA: Diagnosis not present

## 2018-05-30 DIAGNOSIS — D509 Iron deficiency anemia, unspecified: Secondary | ICD-10-CM | POA: Diagnosis not present

## 2018-05-30 DIAGNOSIS — N186 End stage renal disease: Secondary | ICD-10-CM | POA: Diagnosis not present

## 2018-05-30 DIAGNOSIS — Z4932 Encounter for adequacy testing for peritoneal dialysis: Secondary | ICD-10-CM | POA: Diagnosis not present

## 2018-05-30 DIAGNOSIS — E875 Hyperkalemia: Secondary | ICD-10-CM | POA: Diagnosis not present

## 2018-05-30 DIAGNOSIS — N2581 Secondary hyperparathyroidism of renal origin: Secondary | ICD-10-CM | POA: Diagnosis not present

## 2018-05-30 DIAGNOSIS — D631 Anemia in chronic kidney disease: Secondary | ICD-10-CM | POA: Diagnosis not present

## 2018-05-31 DIAGNOSIS — D631 Anemia in chronic kidney disease: Secondary | ICD-10-CM | POA: Diagnosis not present

## 2018-05-31 DIAGNOSIS — N2581 Secondary hyperparathyroidism of renal origin: Secondary | ICD-10-CM | POA: Diagnosis not present

## 2018-05-31 DIAGNOSIS — E875 Hyperkalemia: Secondary | ICD-10-CM | POA: Diagnosis not present

## 2018-05-31 DIAGNOSIS — Z4932 Encounter for adequacy testing for peritoneal dialysis: Secondary | ICD-10-CM | POA: Diagnosis not present

## 2018-05-31 DIAGNOSIS — D509 Iron deficiency anemia, unspecified: Secondary | ICD-10-CM | POA: Diagnosis not present

## 2018-05-31 DIAGNOSIS — N186 End stage renal disease: Secondary | ICD-10-CM | POA: Diagnosis not present

## 2018-06-01 DIAGNOSIS — D631 Anemia in chronic kidney disease: Secondary | ICD-10-CM | POA: Diagnosis not present

## 2018-06-01 DIAGNOSIS — D509 Iron deficiency anemia, unspecified: Secondary | ICD-10-CM | POA: Diagnosis not present

## 2018-06-01 DIAGNOSIS — N186 End stage renal disease: Secondary | ICD-10-CM | POA: Diagnosis not present

## 2018-06-01 DIAGNOSIS — N2581 Secondary hyperparathyroidism of renal origin: Secondary | ICD-10-CM | POA: Diagnosis not present

## 2018-06-01 DIAGNOSIS — Z4932 Encounter for adequacy testing for peritoneal dialysis: Secondary | ICD-10-CM | POA: Diagnosis not present

## 2018-06-01 DIAGNOSIS — E875 Hyperkalemia: Secondary | ICD-10-CM | POA: Diagnosis not present

## 2018-06-02 DIAGNOSIS — E875 Hyperkalemia: Secondary | ICD-10-CM | POA: Diagnosis not present

## 2018-06-02 DIAGNOSIS — D509 Iron deficiency anemia, unspecified: Secondary | ICD-10-CM | POA: Diagnosis not present

## 2018-06-02 DIAGNOSIS — N186 End stage renal disease: Secondary | ICD-10-CM | POA: Diagnosis not present

## 2018-06-02 DIAGNOSIS — D631 Anemia in chronic kidney disease: Secondary | ICD-10-CM | POA: Diagnosis not present

## 2018-06-02 DIAGNOSIS — N2581 Secondary hyperparathyroidism of renal origin: Secondary | ICD-10-CM | POA: Diagnosis not present

## 2018-06-02 DIAGNOSIS — Z4932 Encounter for adequacy testing for peritoneal dialysis: Secondary | ICD-10-CM | POA: Diagnosis not present

## 2018-06-03 DIAGNOSIS — D631 Anemia in chronic kidney disease: Secondary | ICD-10-CM | POA: Diagnosis not present

## 2018-06-03 DIAGNOSIS — N186 End stage renal disease: Secondary | ICD-10-CM | POA: Diagnosis not present

## 2018-06-03 DIAGNOSIS — Z4932 Encounter for adequacy testing for peritoneal dialysis: Secondary | ICD-10-CM | POA: Diagnosis not present

## 2018-06-03 DIAGNOSIS — D509 Iron deficiency anemia, unspecified: Secondary | ICD-10-CM | POA: Diagnosis not present

## 2018-06-03 DIAGNOSIS — E875 Hyperkalemia: Secondary | ICD-10-CM | POA: Diagnosis not present

## 2018-06-03 DIAGNOSIS — N2581 Secondary hyperparathyroidism of renal origin: Secondary | ICD-10-CM | POA: Diagnosis not present

## 2018-06-04 DIAGNOSIS — Z4932 Encounter for adequacy testing for peritoneal dialysis: Secondary | ICD-10-CM | POA: Diagnosis not present

## 2018-06-04 DIAGNOSIS — N186 End stage renal disease: Secondary | ICD-10-CM | POA: Diagnosis not present

## 2018-06-04 DIAGNOSIS — N2581 Secondary hyperparathyroidism of renal origin: Secondary | ICD-10-CM | POA: Diagnosis not present

## 2018-06-04 DIAGNOSIS — D509 Iron deficiency anemia, unspecified: Secondary | ICD-10-CM | POA: Diagnosis not present

## 2018-06-04 DIAGNOSIS — E875 Hyperkalemia: Secondary | ICD-10-CM | POA: Diagnosis not present

## 2018-06-04 DIAGNOSIS — D631 Anemia in chronic kidney disease: Secondary | ICD-10-CM | POA: Diagnosis not present

## 2018-06-05 DIAGNOSIS — N186 End stage renal disease: Secondary | ICD-10-CM | POA: Diagnosis not present

## 2018-06-05 DIAGNOSIS — D509 Iron deficiency anemia, unspecified: Secondary | ICD-10-CM | POA: Diagnosis not present

## 2018-06-05 DIAGNOSIS — D631 Anemia in chronic kidney disease: Secondary | ICD-10-CM | POA: Diagnosis not present

## 2018-06-05 DIAGNOSIS — Z4932 Encounter for adequacy testing for peritoneal dialysis: Secondary | ICD-10-CM | POA: Diagnosis not present

## 2018-06-05 DIAGNOSIS — E875 Hyperkalemia: Secondary | ICD-10-CM | POA: Diagnosis not present

## 2018-06-05 DIAGNOSIS — N2581 Secondary hyperparathyroidism of renal origin: Secondary | ICD-10-CM | POA: Diagnosis not present

## 2018-06-06 DIAGNOSIS — D509 Iron deficiency anemia, unspecified: Secondary | ICD-10-CM | POA: Diagnosis not present

## 2018-06-06 DIAGNOSIS — E7841 Elevated Lipoprotein(a): Secondary | ICD-10-CM | POA: Diagnosis not present

## 2018-06-06 DIAGNOSIS — K769 Liver disease, unspecified: Secondary | ICD-10-CM | POA: Diagnosis not present

## 2018-06-06 DIAGNOSIS — E440 Moderate protein-calorie malnutrition: Secondary | ICD-10-CM | POA: Diagnosis not present

## 2018-06-06 DIAGNOSIS — D631 Anemia in chronic kidney disease: Secondary | ICD-10-CM | POA: Diagnosis not present

## 2018-06-06 DIAGNOSIS — Z79899 Other long term (current) drug therapy: Secondary | ICD-10-CM | POA: Diagnosis not present

## 2018-06-06 DIAGNOSIS — N186 End stage renal disease: Secondary | ICD-10-CM | POA: Diagnosis not present

## 2018-06-06 DIAGNOSIS — N2581 Secondary hyperparathyroidism of renal origin: Secondary | ICD-10-CM | POA: Diagnosis not present

## 2018-06-07 DIAGNOSIS — D631 Anemia in chronic kidney disease: Secondary | ICD-10-CM | POA: Diagnosis not present

## 2018-06-07 DIAGNOSIS — N2581 Secondary hyperparathyroidism of renal origin: Secondary | ICD-10-CM | POA: Diagnosis not present

## 2018-06-07 DIAGNOSIS — E7841 Elevated Lipoprotein(a): Secondary | ICD-10-CM | POA: Diagnosis not present

## 2018-06-07 DIAGNOSIS — N186 End stage renal disease: Secondary | ICD-10-CM | POA: Diagnosis not present

## 2018-06-07 DIAGNOSIS — D509 Iron deficiency anemia, unspecified: Secondary | ICD-10-CM | POA: Diagnosis not present

## 2018-06-07 DIAGNOSIS — Z79899 Other long term (current) drug therapy: Secondary | ICD-10-CM | POA: Diagnosis not present

## 2018-06-08 DIAGNOSIS — D631 Anemia in chronic kidney disease: Secondary | ICD-10-CM | POA: Diagnosis not present

## 2018-06-08 DIAGNOSIS — E7841 Elevated Lipoprotein(a): Secondary | ICD-10-CM | POA: Diagnosis not present

## 2018-06-08 DIAGNOSIS — Z79899 Other long term (current) drug therapy: Secondary | ICD-10-CM | POA: Diagnosis not present

## 2018-06-08 DIAGNOSIS — D509 Iron deficiency anemia, unspecified: Secondary | ICD-10-CM | POA: Diagnosis not present

## 2018-06-08 DIAGNOSIS — N186 End stage renal disease: Secondary | ICD-10-CM | POA: Diagnosis not present

## 2018-06-08 DIAGNOSIS — N2581 Secondary hyperparathyroidism of renal origin: Secondary | ICD-10-CM | POA: Diagnosis not present

## 2018-06-09 DIAGNOSIS — N2581 Secondary hyperparathyroidism of renal origin: Secondary | ICD-10-CM | POA: Diagnosis not present

## 2018-06-09 DIAGNOSIS — D509 Iron deficiency anemia, unspecified: Secondary | ICD-10-CM | POA: Diagnosis not present

## 2018-06-09 DIAGNOSIS — N186 End stage renal disease: Secondary | ICD-10-CM | POA: Diagnosis not present

## 2018-06-09 DIAGNOSIS — D631 Anemia in chronic kidney disease: Secondary | ICD-10-CM | POA: Diagnosis not present

## 2018-06-09 DIAGNOSIS — Z79899 Other long term (current) drug therapy: Secondary | ICD-10-CM | POA: Diagnosis not present

## 2018-06-09 DIAGNOSIS — E7841 Elevated Lipoprotein(a): Secondary | ICD-10-CM | POA: Diagnosis not present

## 2018-06-10 DIAGNOSIS — N2581 Secondary hyperparathyroidism of renal origin: Secondary | ICD-10-CM | POA: Diagnosis not present

## 2018-06-10 DIAGNOSIS — N186 End stage renal disease: Secondary | ICD-10-CM | POA: Diagnosis not present

## 2018-06-10 DIAGNOSIS — Z79899 Other long term (current) drug therapy: Secondary | ICD-10-CM | POA: Diagnosis not present

## 2018-06-10 DIAGNOSIS — N2589 Other disorders resulting from impaired renal tubular function: Secondary | ICD-10-CM | POA: Diagnosis not present

## 2018-06-10 DIAGNOSIS — E7841 Elevated Lipoprotein(a): Secondary | ICD-10-CM | POA: Diagnosis not present

## 2018-06-10 DIAGNOSIS — D631 Anemia in chronic kidney disease: Secondary | ICD-10-CM | POA: Diagnosis not present

## 2018-06-10 DIAGNOSIS — D509 Iron deficiency anemia, unspecified: Secondary | ICD-10-CM | POA: Diagnosis not present

## 2018-06-11 DIAGNOSIS — D509 Iron deficiency anemia, unspecified: Secondary | ICD-10-CM | POA: Diagnosis not present

## 2018-06-11 DIAGNOSIS — Z79899 Other long term (current) drug therapy: Secondary | ICD-10-CM | POA: Diagnosis not present

## 2018-06-11 DIAGNOSIS — D631 Anemia in chronic kidney disease: Secondary | ICD-10-CM | POA: Diagnosis not present

## 2018-06-11 DIAGNOSIS — E7841 Elevated Lipoprotein(a): Secondary | ICD-10-CM | POA: Diagnosis not present

## 2018-06-11 DIAGNOSIS — N186 End stage renal disease: Secondary | ICD-10-CM | POA: Diagnosis not present

## 2018-06-11 DIAGNOSIS — N2581 Secondary hyperparathyroidism of renal origin: Secondary | ICD-10-CM | POA: Diagnosis not present

## 2018-06-12 DIAGNOSIS — D631 Anemia in chronic kidney disease: Secondary | ICD-10-CM | POA: Diagnosis not present

## 2018-06-12 DIAGNOSIS — N2581 Secondary hyperparathyroidism of renal origin: Secondary | ICD-10-CM | POA: Diagnosis not present

## 2018-06-12 DIAGNOSIS — N186 End stage renal disease: Secondary | ICD-10-CM | POA: Diagnosis not present

## 2018-06-12 DIAGNOSIS — Z79899 Other long term (current) drug therapy: Secondary | ICD-10-CM | POA: Diagnosis not present

## 2018-06-12 DIAGNOSIS — D509 Iron deficiency anemia, unspecified: Secondary | ICD-10-CM | POA: Diagnosis not present

## 2018-06-12 DIAGNOSIS — E7841 Elevated Lipoprotein(a): Secondary | ICD-10-CM | POA: Diagnosis not present

## 2018-06-13 DIAGNOSIS — E7841 Elevated Lipoprotein(a): Secondary | ICD-10-CM | POA: Diagnosis not present

## 2018-06-13 DIAGNOSIS — Z79899 Other long term (current) drug therapy: Secondary | ICD-10-CM | POA: Diagnosis not present

## 2018-06-13 DIAGNOSIS — N2581 Secondary hyperparathyroidism of renal origin: Secondary | ICD-10-CM | POA: Diagnosis not present

## 2018-06-13 DIAGNOSIS — D631 Anemia in chronic kidney disease: Secondary | ICD-10-CM | POA: Diagnosis not present

## 2018-06-13 DIAGNOSIS — D509 Iron deficiency anemia, unspecified: Secondary | ICD-10-CM | POA: Diagnosis not present

## 2018-06-13 DIAGNOSIS — N186 End stage renal disease: Secondary | ICD-10-CM | POA: Diagnosis not present

## 2018-06-14 DIAGNOSIS — N186 End stage renal disease: Secondary | ICD-10-CM | POA: Diagnosis not present

## 2018-06-14 DIAGNOSIS — N2581 Secondary hyperparathyroidism of renal origin: Secondary | ICD-10-CM | POA: Diagnosis not present

## 2018-06-14 DIAGNOSIS — D631 Anemia in chronic kidney disease: Secondary | ICD-10-CM | POA: Diagnosis not present

## 2018-06-14 DIAGNOSIS — D509 Iron deficiency anemia, unspecified: Secondary | ICD-10-CM | POA: Diagnosis not present

## 2018-06-14 DIAGNOSIS — E7841 Elevated Lipoprotein(a): Secondary | ICD-10-CM | POA: Diagnosis not present

## 2018-06-14 DIAGNOSIS — Z79899 Other long term (current) drug therapy: Secondary | ICD-10-CM | POA: Diagnosis not present

## 2018-06-15 DIAGNOSIS — N2581 Secondary hyperparathyroidism of renal origin: Secondary | ICD-10-CM | POA: Diagnosis not present

## 2018-06-15 DIAGNOSIS — E7841 Elevated Lipoprotein(a): Secondary | ICD-10-CM | POA: Diagnosis not present

## 2018-06-15 DIAGNOSIS — Z79899 Other long term (current) drug therapy: Secondary | ICD-10-CM | POA: Diagnosis not present

## 2018-06-15 DIAGNOSIS — N186 End stage renal disease: Secondary | ICD-10-CM | POA: Diagnosis not present

## 2018-06-15 DIAGNOSIS — D631 Anemia in chronic kidney disease: Secondary | ICD-10-CM | POA: Diagnosis not present

## 2018-06-15 DIAGNOSIS — D509 Iron deficiency anemia, unspecified: Secondary | ICD-10-CM | POA: Diagnosis not present

## 2018-06-16 DIAGNOSIS — N186 End stage renal disease: Secondary | ICD-10-CM | POA: Diagnosis not present

## 2018-06-16 DIAGNOSIS — D509 Iron deficiency anemia, unspecified: Secondary | ICD-10-CM | POA: Diagnosis not present

## 2018-06-16 DIAGNOSIS — N2581 Secondary hyperparathyroidism of renal origin: Secondary | ICD-10-CM | POA: Diagnosis not present

## 2018-06-16 DIAGNOSIS — Z79899 Other long term (current) drug therapy: Secondary | ICD-10-CM | POA: Diagnosis not present

## 2018-06-16 DIAGNOSIS — E7841 Elevated Lipoprotein(a): Secondary | ICD-10-CM | POA: Diagnosis not present

## 2018-06-16 DIAGNOSIS — D631 Anemia in chronic kidney disease: Secondary | ICD-10-CM | POA: Diagnosis not present

## 2018-06-17 DIAGNOSIS — D509 Iron deficiency anemia, unspecified: Secondary | ICD-10-CM | POA: Diagnosis not present

## 2018-06-17 DIAGNOSIS — Z79899 Other long term (current) drug therapy: Secondary | ICD-10-CM | POA: Diagnosis not present

## 2018-06-17 DIAGNOSIS — N2581 Secondary hyperparathyroidism of renal origin: Secondary | ICD-10-CM | POA: Diagnosis not present

## 2018-06-17 DIAGNOSIS — D631 Anemia in chronic kidney disease: Secondary | ICD-10-CM | POA: Diagnosis not present

## 2018-06-17 DIAGNOSIS — E7841 Elevated Lipoprotein(a): Secondary | ICD-10-CM | POA: Diagnosis not present

## 2018-06-17 DIAGNOSIS — N186 End stage renal disease: Secondary | ICD-10-CM | POA: Diagnosis not present

## 2018-06-18 DIAGNOSIS — N2581 Secondary hyperparathyroidism of renal origin: Secondary | ICD-10-CM | POA: Diagnosis not present

## 2018-06-18 DIAGNOSIS — D631 Anemia in chronic kidney disease: Secondary | ICD-10-CM | POA: Diagnosis not present

## 2018-06-18 DIAGNOSIS — Z79899 Other long term (current) drug therapy: Secondary | ICD-10-CM | POA: Diagnosis not present

## 2018-06-18 DIAGNOSIS — N186 End stage renal disease: Secondary | ICD-10-CM | POA: Diagnosis not present

## 2018-06-18 DIAGNOSIS — D509 Iron deficiency anemia, unspecified: Secondary | ICD-10-CM | POA: Diagnosis not present

## 2018-06-18 DIAGNOSIS — E7841 Elevated Lipoprotein(a): Secondary | ICD-10-CM | POA: Diagnosis not present

## 2018-06-19 DIAGNOSIS — N2581 Secondary hyperparathyroidism of renal origin: Secondary | ICD-10-CM | POA: Diagnosis not present

## 2018-06-19 DIAGNOSIS — D509 Iron deficiency anemia, unspecified: Secondary | ICD-10-CM | POA: Diagnosis not present

## 2018-06-19 DIAGNOSIS — N186 End stage renal disease: Secondary | ICD-10-CM | POA: Diagnosis not present

## 2018-06-19 DIAGNOSIS — D631 Anemia in chronic kidney disease: Secondary | ICD-10-CM | POA: Diagnosis not present

## 2018-06-19 DIAGNOSIS — Z79899 Other long term (current) drug therapy: Secondary | ICD-10-CM | POA: Diagnosis not present

## 2018-06-19 DIAGNOSIS — E7841 Elevated Lipoprotein(a): Secondary | ICD-10-CM | POA: Diagnosis not present

## 2018-06-20 DIAGNOSIS — D509 Iron deficiency anemia, unspecified: Secondary | ICD-10-CM | POA: Diagnosis not present

## 2018-06-20 DIAGNOSIS — N2581 Secondary hyperparathyroidism of renal origin: Secondary | ICD-10-CM | POA: Diagnosis not present

## 2018-06-20 DIAGNOSIS — Z79899 Other long term (current) drug therapy: Secondary | ICD-10-CM | POA: Diagnosis not present

## 2018-06-20 DIAGNOSIS — E7841 Elevated Lipoprotein(a): Secondary | ICD-10-CM | POA: Diagnosis not present

## 2018-06-20 DIAGNOSIS — N186 End stage renal disease: Secondary | ICD-10-CM | POA: Diagnosis not present

## 2018-06-20 DIAGNOSIS — D631 Anemia in chronic kidney disease: Secondary | ICD-10-CM | POA: Diagnosis not present

## 2018-06-21 DIAGNOSIS — N186 End stage renal disease: Secondary | ICD-10-CM | POA: Diagnosis not present

## 2018-06-21 DIAGNOSIS — N2581 Secondary hyperparathyroidism of renal origin: Secondary | ICD-10-CM | POA: Diagnosis not present

## 2018-06-21 DIAGNOSIS — D509 Iron deficiency anemia, unspecified: Secondary | ICD-10-CM | POA: Diagnosis not present

## 2018-06-21 DIAGNOSIS — E7841 Elevated Lipoprotein(a): Secondary | ICD-10-CM | POA: Diagnosis not present

## 2018-06-21 DIAGNOSIS — Z79899 Other long term (current) drug therapy: Secondary | ICD-10-CM | POA: Diagnosis not present

## 2018-06-21 DIAGNOSIS — D631 Anemia in chronic kidney disease: Secondary | ICD-10-CM | POA: Diagnosis not present

## 2018-06-22 DIAGNOSIS — D631 Anemia in chronic kidney disease: Secondary | ICD-10-CM | POA: Diagnosis not present

## 2018-06-22 DIAGNOSIS — D509 Iron deficiency anemia, unspecified: Secondary | ICD-10-CM | POA: Diagnosis not present

## 2018-06-22 DIAGNOSIS — Z79899 Other long term (current) drug therapy: Secondary | ICD-10-CM | POA: Diagnosis not present

## 2018-06-22 DIAGNOSIS — N186 End stage renal disease: Secondary | ICD-10-CM | POA: Diagnosis not present

## 2018-06-22 DIAGNOSIS — E7841 Elevated Lipoprotein(a): Secondary | ICD-10-CM | POA: Diagnosis not present

## 2018-06-22 DIAGNOSIS — N2581 Secondary hyperparathyroidism of renal origin: Secondary | ICD-10-CM | POA: Diagnosis not present

## 2018-06-23 DIAGNOSIS — E7841 Elevated Lipoprotein(a): Secondary | ICD-10-CM | POA: Diagnosis not present

## 2018-06-23 DIAGNOSIS — Z79899 Other long term (current) drug therapy: Secondary | ICD-10-CM | POA: Diagnosis not present

## 2018-06-23 DIAGNOSIS — D631 Anemia in chronic kidney disease: Secondary | ICD-10-CM | POA: Diagnosis not present

## 2018-06-23 DIAGNOSIS — N2581 Secondary hyperparathyroidism of renal origin: Secondary | ICD-10-CM | POA: Diagnosis not present

## 2018-06-23 DIAGNOSIS — D509 Iron deficiency anemia, unspecified: Secondary | ICD-10-CM | POA: Diagnosis not present

## 2018-06-23 DIAGNOSIS — N186 End stage renal disease: Secondary | ICD-10-CM | POA: Diagnosis not present

## 2018-06-24 DIAGNOSIS — D509 Iron deficiency anemia, unspecified: Secondary | ICD-10-CM | POA: Diagnosis not present

## 2018-06-24 DIAGNOSIS — N186 End stage renal disease: Secondary | ICD-10-CM | POA: Diagnosis not present

## 2018-06-24 DIAGNOSIS — D631 Anemia in chronic kidney disease: Secondary | ICD-10-CM | POA: Diagnosis not present

## 2018-06-24 DIAGNOSIS — N2581 Secondary hyperparathyroidism of renal origin: Secondary | ICD-10-CM | POA: Diagnosis not present

## 2018-06-24 DIAGNOSIS — Z79899 Other long term (current) drug therapy: Secondary | ICD-10-CM | POA: Diagnosis not present

## 2018-06-24 DIAGNOSIS — E7841 Elevated Lipoprotein(a): Secondary | ICD-10-CM | POA: Diagnosis not present

## 2018-06-25 DIAGNOSIS — Z79899 Other long term (current) drug therapy: Secondary | ICD-10-CM | POA: Diagnosis not present

## 2018-06-25 DIAGNOSIS — D631 Anemia in chronic kidney disease: Secondary | ICD-10-CM | POA: Diagnosis not present

## 2018-06-25 DIAGNOSIS — E7841 Elevated Lipoprotein(a): Secondary | ICD-10-CM | POA: Diagnosis not present

## 2018-06-25 DIAGNOSIS — N186 End stage renal disease: Secondary | ICD-10-CM | POA: Diagnosis not present

## 2018-06-25 DIAGNOSIS — D509 Iron deficiency anemia, unspecified: Secondary | ICD-10-CM | POA: Diagnosis not present

## 2018-06-25 DIAGNOSIS — N2581 Secondary hyperparathyroidism of renal origin: Secondary | ICD-10-CM | POA: Diagnosis not present

## 2018-06-26 DIAGNOSIS — E7841 Elevated Lipoprotein(a): Secondary | ICD-10-CM | POA: Diagnosis not present

## 2018-06-26 DIAGNOSIS — Z79899 Other long term (current) drug therapy: Secondary | ICD-10-CM | POA: Diagnosis not present

## 2018-06-26 DIAGNOSIS — D631 Anemia in chronic kidney disease: Secondary | ICD-10-CM | POA: Diagnosis not present

## 2018-06-26 DIAGNOSIS — D509 Iron deficiency anemia, unspecified: Secondary | ICD-10-CM | POA: Diagnosis not present

## 2018-06-26 DIAGNOSIS — N2581 Secondary hyperparathyroidism of renal origin: Secondary | ICD-10-CM | POA: Diagnosis not present

## 2018-06-26 DIAGNOSIS — N186 End stage renal disease: Secondary | ICD-10-CM | POA: Diagnosis not present

## 2018-06-27 DIAGNOSIS — N2581 Secondary hyperparathyroidism of renal origin: Secondary | ICD-10-CM | POA: Diagnosis not present

## 2018-06-27 DIAGNOSIS — Z79899 Other long term (current) drug therapy: Secondary | ICD-10-CM | POA: Diagnosis not present

## 2018-06-27 DIAGNOSIS — N186 End stage renal disease: Secondary | ICD-10-CM | POA: Diagnosis not present

## 2018-06-27 DIAGNOSIS — E7841 Elevated Lipoprotein(a): Secondary | ICD-10-CM | POA: Diagnosis not present

## 2018-06-27 DIAGNOSIS — D631 Anemia in chronic kidney disease: Secondary | ICD-10-CM | POA: Diagnosis not present

## 2018-06-27 DIAGNOSIS — D509 Iron deficiency anemia, unspecified: Secondary | ICD-10-CM | POA: Diagnosis not present

## 2018-06-28 DIAGNOSIS — N2581 Secondary hyperparathyroidism of renal origin: Secondary | ICD-10-CM | POA: Diagnosis not present

## 2018-06-28 DIAGNOSIS — D631 Anemia in chronic kidney disease: Secondary | ICD-10-CM | POA: Diagnosis not present

## 2018-06-28 DIAGNOSIS — D509 Iron deficiency anemia, unspecified: Secondary | ICD-10-CM | POA: Diagnosis not present

## 2018-06-28 DIAGNOSIS — Z79899 Other long term (current) drug therapy: Secondary | ICD-10-CM | POA: Diagnosis not present

## 2018-06-28 DIAGNOSIS — E7841 Elevated Lipoprotein(a): Secondary | ICD-10-CM | POA: Diagnosis not present

## 2018-06-28 DIAGNOSIS — N186 End stage renal disease: Secondary | ICD-10-CM | POA: Diagnosis not present

## 2018-06-29 DIAGNOSIS — D631 Anemia in chronic kidney disease: Secondary | ICD-10-CM | POA: Diagnosis not present

## 2018-06-29 DIAGNOSIS — D509 Iron deficiency anemia, unspecified: Secondary | ICD-10-CM | POA: Diagnosis not present

## 2018-06-29 DIAGNOSIS — N2581 Secondary hyperparathyroidism of renal origin: Secondary | ICD-10-CM | POA: Diagnosis not present

## 2018-06-29 DIAGNOSIS — N186 End stage renal disease: Secondary | ICD-10-CM | POA: Diagnosis not present

## 2018-06-29 DIAGNOSIS — E7841 Elevated Lipoprotein(a): Secondary | ICD-10-CM | POA: Diagnosis not present

## 2018-06-29 DIAGNOSIS — Z79899 Other long term (current) drug therapy: Secondary | ICD-10-CM | POA: Diagnosis not present

## 2018-06-30 DIAGNOSIS — Z79899 Other long term (current) drug therapy: Secondary | ICD-10-CM | POA: Diagnosis not present

## 2018-06-30 DIAGNOSIS — N186 End stage renal disease: Secondary | ICD-10-CM | POA: Diagnosis not present

## 2018-06-30 DIAGNOSIS — E7841 Elevated Lipoprotein(a): Secondary | ICD-10-CM | POA: Diagnosis not present

## 2018-06-30 DIAGNOSIS — D631 Anemia in chronic kidney disease: Secondary | ICD-10-CM | POA: Diagnosis not present

## 2018-06-30 DIAGNOSIS — N2581 Secondary hyperparathyroidism of renal origin: Secondary | ICD-10-CM | POA: Diagnosis not present

## 2018-06-30 DIAGNOSIS — D509 Iron deficiency anemia, unspecified: Secondary | ICD-10-CM | POA: Diagnosis not present

## 2018-07-01 DIAGNOSIS — Z79899 Other long term (current) drug therapy: Secondary | ICD-10-CM | POA: Diagnosis not present

## 2018-07-01 DIAGNOSIS — E7841 Elevated Lipoprotein(a): Secondary | ICD-10-CM | POA: Diagnosis not present

## 2018-07-01 DIAGNOSIS — N2581 Secondary hyperparathyroidism of renal origin: Secondary | ICD-10-CM | POA: Diagnosis not present

## 2018-07-01 DIAGNOSIS — D509 Iron deficiency anemia, unspecified: Secondary | ICD-10-CM | POA: Diagnosis not present

## 2018-07-01 DIAGNOSIS — D631 Anemia in chronic kidney disease: Secondary | ICD-10-CM | POA: Diagnosis not present

## 2018-07-01 DIAGNOSIS — N186 End stage renal disease: Secondary | ICD-10-CM | POA: Diagnosis not present

## 2018-07-02 DIAGNOSIS — Z79899 Other long term (current) drug therapy: Secondary | ICD-10-CM | POA: Diagnosis not present

## 2018-07-02 DIAGNOSIS — N186 End stage renal disease: Secondary | ICD-10-CM | POA: Diagnosis not present

## 2018-07-02 DIAGNOSIS — E7841 Elevated Lipoprotein(a): Secondary | ICD-10-CM | POA: Diagnosis not present

## 2018-07-02 DIAGNOSIS — D631 Anemia in chronic kidney disease: Secondary | ICD-10-CM | POA: Diagnosis not present

## 2018-07-02 DIAGNOSIS — N2581 Secondary hyperparathyroidism of renal origin: Secondary | ICD-10-CM | POA: Diagnosis not present

## 2018-07-02 DIAGNOSIS — D509 Iron deficiency anemia, unspecified: Secondary | ICD-10-CM | POA: Diagnosis not present

## 2018-07-03 DIAGNOSIS — D631 Anemia in chronic kidney disease: Secondary | ICD-10-CM | POA: Diagnosis not present

## 2018-07-03 DIAGNOSIS — N2581 Secondary hyperparathyroidism of renal origin: Secondary | ICD-10-CM | POA: Diagnosis not present

## 2018-07-03 DIAGNOSIS — N186 End stage renal disease: Secondary | ICD-10-CM | POA: Diagnosis not present

## 2018-07-03 DIAGNOSIS — D509 Iron deficiency anemia, unspecified: Secondary | ICD-10-CM | POA: Diagnosis not present

## 2018-07-03 DIAGNOSIS — E7841 Elevated Lipoprotein(a): Secondary | ICD-10-CM | POA: Diagnosis not present

## 2018-07-03 DIAGNOSIS — Z79899 Other long term (current) drug therapy: Secondary | ICD-10-CM | POA: Diagnosis not present

## 2018-07-04 DIAGNOSIS — E7841 Elevated Lipoprotein(a): Secondary | ICD-10-CM | POA: Diagnosis not present

## 2018-07-04 DIAGNOSIS — D509 Iron deficiency anemia, unspecified: Secondary | ICD-10-CM | POA: Diagnosis not present

## 2018-07-04 DIAGNOSIS — D631 Anemia in chronic kidney disease: Secondary | ICD-10-CM | POA: Diagnosis not present

## 2018-07-04 DIAGNOSIS — Z79899 Other long term (current) drug therapy: Secondary | ICD-10-CM | POA: Diagnosis not present

## 2018-07-04 DIAGNOSIS — N2581 Secondary hyperparathyroidism of renal origin: Secondary | ICD-10-CM | POA: Diagnosis not present

## 2018-07-04 DIAGNOSIS — N186 End stage renal disease: Secondary | ICD-10-CM | POA: Diagnosis not present

## 2018-07-05 DIAGNOSIS — D631 Anemia in chronic kidney disease: Secondary | ICD-10-CM | POA: Diagnosis not present

## 2018-07-05 DIAGNOSIS — Z79899 Other long term (current) drug therapy: Secondary | ICD-10-CM | POA: Diagnosis not present

## 2018-07-05 DIAGNOSIS — N186 End stage renal disease: Secondary | ICD-10-CM | POA: Diagnosis not present

## 2018-07-05 DIAGNOSIS — E7841 Elevated Lipoprotein(a): Secondary | ICD-10-CM | POA: Diagnosis not present

## 2018-07-05 DIAGNOSIS — N2581 Secondary hyperparathyroidism of renal origin: Secondary | ICD-10-CM | POA: Diagnosis not present

## 2018-07-05 DIAGNOSIS — D509 Iron deficiency anemia, unspecified: Secondary | ICD-10-CM | POA: Diagnosis not present

## 2018-07-06 DIAGNOSIS — I129 Hypertensive chronic kidney disease with stage 1 through stage 4 chronic kidney disease, or unspecified chronic kidney disease: Secondary | ICD-10-CM | POA: Diagnosis not present

## 2018-07-06 DIAGNOSIS — N186 End stage renal disease: Secondary | ICD-10-CM | POA: Diagnosis not present

## 2018-07-06 DIAGNOSIS — N2581 Secondary hyperparathyroidism of renal origin: Secondary | ICD-10-CM | POA: Diagnosis not present

## 2018-07-06 DIAGNOSIS — Z992 Dependence on renal dialysis: Secondary | ICD-10-CM | POA: Diagnosis not present

## 2018-07-06 DIAGNOSIS — D509 Iron deficiency anemia, unspecified: Secondary | ICD-10-CM | POA: Diagnosis not present

## 2018-07-06 DIAGNOSIS — E7841 Elevated Lipoprotein(a): Secondary | ICD-10-CM | POA: Diagnosis not present

## 2018-07-06 DIAGNOSIS — D631 Anemia in chronic kidney disease: Secondary | ICD-10-CM | POA: Diagnosis not present

## 2018-07-06 DIAGNOSIS — Z79899 Other long term (current) drug therapy: Secondary | ICD-10-CM | POA: Diagnosis not present

## 2018-07-07 DIAGNOSIS — Z4932 Encounter for adequacy testing for peritoneal dialysis: Secondary | ICD-10-CM | POA: Diagnosis not present

## 2018-07-07 DIAGNOSIS — N2581 Secondary hyperparathyroidism of renal origin: Secondary | ICD-10-CM | POA: Diagnosis not present

## 2018-07-07 DIAGNOSIS — N186 End stage renal disease: Secondary | ICD-10-CM | POA: Diagnosis not present

## 2018-07-07 DIAGNOSIS — D631 Anemia in chronic kidney disease: Secondary | ICD-10-CM | POA: Diagnosis not present

## 2018-07-07 DIAGNOSIS — K769 Liver disease, unspecified: Secondary | ICD-10-CM | POA: Diagnosis not present

## 2018-07-07 DIAGNOSIS — N2589 Other disorders resulting from impaired renal tubular function: Secondary | ICD-10-CM | POA: Diagnosis not present

## 2018-07-07 DIAGNOSIS — Z79899 Other long term (current) drug therapy: Secondary | ICD-10-CM | POA: Diagnosis not present

## 2018-07-07 DIAGNOSIS — E7841 Elevated Lipoprotein(a): Secondary | ICD-10-CM | POA: Diagnosis not present

## 2018-07-07 DIAGNOSIS — D509 Iron deficiency anemia, unspecified: Secondary | ICD-10-CM | POA: Diagnosis not present

## 2018-07-07 DIAGNOSIS — E440 Moderate protein-calorie malnutrition: Secondary | ICD-10-CM | POA: Diagnosis not present

## 2018-07-08 DIAGNOSIS — N2581 Secondary hyperparathyroidism of renal origin: Secondary | ICD-10-CM | POA: Diagnosis not present

## 2018-07-08 DIAGNOSIS — Z4932 Encounter for adequacy testing for peritoneal dialysis: Secondary | ICD-10-CM | POA: Diagnosis not present

## 2018-07-08 DIAGNOSIS — N2589 Other disorders resulting from impaired renal tubular function: Secondary | ICD-10-CM | POA: Diagnosis not present

## 2018-07-08 DIAGNOSIS — N186 End stage renal disease: Secondary | ICD-10-CM | POA: Diagnosis not present

## 2018-07-08 DIAGNOSIS — E7841 Elevated Lipoprotein(a): Secondary | ICD-10-CM | POA: Diagnosis not present

## 2018-07-08 DIAGNOSIS — D631 Anemia in chronic kidney disease: Secondary | ICD-10-CM | POA: Diagnosis not present

## 2018-07-08 DIAGNOSIS — D509 Iron deficiency anemia, unspecified: Secondary | ICD-10-CM | POA: Diagnosis not present

## 2018-07-09 DIAGNOSIS — E7841 Elevated Lipoprotein(a): Secondary | ICD-10-CM | POA: Diagnosis not present

## 2018-07-09 DIAGNOSIS — N2581 Secondary hyperparathyroidism of renal origin: Secondary | ICD-10-CM | POA: Diagnosis not present

## 2018-07-09 DIAGNOSIS — D509 Iron deficiency anemia, unspecified: Secondary | ICD-10-CM | POA: Diagnosis not present

## 2018-07-09 DIAGNOSIS — D631 Anemia in chronic kidney disease: Secondary | ICD-10-CM | POA: Diagnosis not present

## 2018-07-09 DIAGNOSIS — Z4932 Encounter for adequacy testing for peritoneal dialysis: Secondary | ICD-10-CM | POA: Diagnosis not present

## 2018-07-09 DIAGNOSIS — N186 End stage renal disease: Secondary | ICD-10-CM | POA: Diagnosis not present

## 2018-07-10 DIAGNOSIS — D509 Iron deficiency anemia, unspecified: Secondary | ICD-10-CM | POA: Diagnosis not present

## 2018-07-10 DIAGNOSIS — N186 End stage renal disease: Secondary | ICD-10-CM | POA: Diagnosis not present

## 2018-07-10 DIAGNOSIS — N2581 Secondary hyperparathyroidism of renal origin: Secondary | ICD-10-CM | POA: Diagnosis not present

## 2018-07-10 DIAGNOSIS — E7841 Elevated Lipoprotein(a): Secondary | ICD-10-CM | POA: Diagnosis not present

## 2018-07-10 DIAGNOSIS — Z4932 Encounter for adequacy testing for peritoneal dialysis: Secondary | ICD-10-CM | POA: Diagnosis not present

## 2018-07-10 DIAGNOSIS — D631 Anemia in chronic kidney disease: Secondary | ICD-10-CM | POA: Diagnosis not present

## 2018-07-11 DIAGNOSIS — E7841 Elevated Lipoprotein(a): Secondary | ICD-10-CM | POA: Diagnosis not present

## 2018-07-11 DIAGNOSIS — D631 Anemia in chronic kidney disease: Secondary | ICD-10-CM | POA: Diagnosis not present

## 2018-07-11 DIAGNOSIS — N2581 Secondary hyperparathyroidism of renal origin: Secondary | ICD-10-CM | POA: Diagnosis not present

## 2018-07-11 DIAGNOSIS — D509 Iron deficiency anemia, unspecified: Secondary | ICD-10-CM | POA: Diagnosis not present

## 2018-07-11 DIAGNOSIS — N186 End stage renal disease: Secondary | ICD-10-CM | POA: Diagnosis not present

## 2018-07-11 DIAGNOSIS — Z4932 Encounter for adequacy testing for peritoneal dialysis: Secondary | ICD-10-CM | POA: Diagnosis not present

## 2018-07-12 DIAGNOSIS — D509 Iron deficiency anemia, unspecified: Secondary | ICD-10-CM | POA: Diagnosis not present

## 2018-07-12 DIAGNOSIS — N186 End stage renal disease: Secondary | ICD-10-CM | POA: Diagnosis not present

## 2018-07-12 DIAGNOSIS — E7841 Elevated Lipoprotein(a): Secondary | ICD-10-CM | POA: Diagnosis not present

## 2018-07-12 DIAGNOSIS — D631 Anemia in chronic kidney disease: Secondary | ICD-10-CM | POA: Diagnosis not present

## 2018-07-12 DIAGNOSIS — Z4932 Encounter for adequacy testing for peritoneal dialysis: Secondary | ICD-10-CM | POA: Diagnosis not present

## 2018-07-12 DIAGNOSIS — N2581 Secondary hyperparathyroidism of renal origin: Secondary | ICD-10-CM | POA: Diagnosis not present

## 2018-07-13 DIAGNOSIS — N2581 Secondary hyperparathyroidism of renal origin: Secondary | ICD-10-CM | POA: Diagnosis not present

## 2018-07-13 DIAGNOSIS — Z4932 Encounter for adequacy testing for peritoneal dialysis: Secondary | ICD-10-CM | POA: Diagnosis not present

## 2018-07-13 DIAGNOSIS — N186 End stage renal disease: Secondary | ICD-10-CM | POA: Diagnosis not present

## 2018-07-13 DIAGNOSIS — D631 Anemia in chronic kidney disease: Secondary | ICD-10-CM | POA: Diagnosis not present

## 2018-07-13 DIAGNOSIS — E7841 Elevated Lipoprotein(a): Secondary | ICD-10-CM | POA: Diagnosis not present

## 2018-07-13 DIAGNOSIS — D509 Iron deficiency anemia, unspecified: Secondary | ICD-10-CM | POA: Diagnosis not present

## 2018-07-14 DIAGNOSIS — D631 Anemia in chronic kidney disease: Secondary | ICD-10-CM | POA: Diagnosis not present

## 2018-07-14 DIAGNOSIS — D509 Iron deficiency anemia, unspecified: Secondary | ICD-10-CM | POA: Diagnosis not present

## 2018-07-14 DIAGNOSIS — N2581 Secondary hyperparathyroidism of renal origin: Secondary | ICD-10-CM | POA: Diagnosis not present

## 2018-07-14 DIAGNOSIS — N186 End stage renal disease: Secondary | ICD-10-CM | POA: Diagnosis not present

## 2018-07-14 DIAGNOSIS — E7841 Elevated Lipoprotein(a): Secondary | ICD-10-CM | POA: Diagnosis not present

## 2018-07-14 DIAGNOSIS — Z4932 Encounter for adequacy testing for peritoneal dialysis: Secondary | ICD-10-CM | POA: Diagnosis not present

## 2018-07-15 DIAGNOSIS — E7841 Elevated Lipoprotein(a): Secondary | ICD-10-CM | POA: Diagnosis not present

## 2018-07-15 DIAGNOSIS — D631 Anemia in chronic kidney disease: Secondary | ICD-10-CM | POA: Diagnosis not present

## 2018-07-15 DIAGNOSIS — D509 Iron deficiency anemia, unspecified: Secondary | ICD-10-CM | POA: Diagnosis not present

## 2018-07-15 DIAGNOSIS — N2581 Secondary hyperparathyroidism of renal origin: Secondary | ICD-10-CM | POA: Diagnosis not present

## 2018-07-15 DIAGNOSIS — N186 End stage renal disease: Secondary | ICD-10-CM | POA: Diagnosis not present

## 2018-07-15 DIAGNOSIS — Z4932 Encounter for adequacy testing for peritoneal dialysis: Secondary | ICD-10-CM | POA: Diagnosis not present

## 2018-07-16 DIAGNOSIS — N2581 Secondary hyperparathyroidism of renal origin: Secondary | ICD-10-CM | POA: Diagnosis not present

## 2018-07-16 DIAGNOSIS — E7841 Elevated Lipoprotein(a): Secondary | ICD-10-CM | POA: Diagnosis not present

## 2018-07-16 DIAGNOSIS — D509 Iron deficiency anemia, unspecified: Secondary | ICD-10-CM | POA: Diagnosis not present

## 2018-07-16 DIAGNOSIS — Z4932 Encounter for adequacy testing for peritoneal dialysis: Secondary | ICD-10-CM | POA: Diagnosis not present

## 2018-07-16 DIAGNOSIS — D631 Anemia in chronic kidney disease: Secondary | ICD-10-CM | POA: Diagnosis not present

## 2018-07-16 DIAGNOSIS — N186 End stage renal disease: Secondary | ICD-10-CM | POA: Diagnosis not present

## 2018-07-17 DIAGNOSIS — D509 Iron deficiency anemia, unspecified: Secondary | ICD-10-CM | POA: Diagnosis not present

## 2018-07-17 DIAGNOSIS — Z4932 Encounter for adequacy testing for peritoneal dialysis: Secondary | ICD-10-CM | POA: Diagnosis not present

## 2018-07-17 DIAGNOSIS — D631 Anemia in chronic kidney disease: Secondary | ICD-10-CM | POA: Diagnosis not present

## 2018-07-17 DIAGNOSIS — E7841 Elevated Lipoprotein(a): Secondary | ICD-10-CM | POA: Diagnosis not present

## 2018-07-17 DIAGNOSIS — N2581 Secondary hyperparathyroidism of renal origin: Secondary | ICD-10-CM | POA: Diagnosis not present

## 2018-07-17 DIAGNOSIS — N186 End stage renal disease: Secondary | ICD-10-CM | POA: Diagnosis not present

## 2018-07-18 DIAGNOSIS — E7841 Elevated Lipoprotein(a): Secondary | ICD-10-CM | POA: Diagnosis not present

## 2018-07-18 DIAGNOSIS — N2581 Secondary hyperparathyroidism of renal origin: Secondary | ICD-10-CM | POA: Diagnosis not present

## 2018-07-18 DIAGNOSIS — D631 Anemia in chronic kidney disease: Secondary | ICD-10-CM | POA: Diagnosis not present

## 2018-07-18 DIAGNOSIS — N186 End stage renal disease: Secondary | ICD-10-CM | POA: Diagnosis not present

## 2018-07-18 DIAGNOSIS — Z4932 Encounter for adequacy testing for peritoneal dialysis: Secondary | ICD-10-CM | POA: Diagnosis not present

## 2018-07-18 DIAGNOSIS — D509 Iron deficiency anemia, unspecified: Secondary | ICD-10-CM | POA: Diagnosis not present

## 2018-07-19 DIAGNOSIS — E7841 Elevated Lipoprotein(a): Secondary | ICD-10-CM | POA: Diagnosis not present

## 2018-07-19 DIAGNOSIS — D631 Anemia in chronic kidney disease: Secondary | ICD-10-CM | POA: Diagnosis not present

## 2018-07-19 DIAGNOSIS — N186 End stage renal disease: Secondary | ICD-10-CM | POA: Diagnosis not present

## 2018-07-19 DIAGNOSIS — Z4932 Encounter for adequacy testing for peritoneal dialysis: Secondary | ICD-10-CM | POA: Diagnosis not present

## 2018-07-19 DIAGNOSIS — N2581 Secondary hyperparathyroidism of renal origin: Secondary | ICD-10-CM | POA: Diagnosis not present

## 2018-07-19 DIAGNOSIS — D509 Iron deficiency anemia, unspecified: Secondary | ICD-10-CM | POA: Diagnosis not present

## 2018-07-20 DIAGNOSIS — N186 End stage renal disease: Secondary | ICD-10-CM | POA: Diagnosis not present

## 2018-07-20 DIAGNOSIS — N2581 Secondary hyperparathyroidism of renal origin: Secondary | ICD-10-CM | POA: Diagnosis not present

## 2018-07-20 DIAGNOSIS — D509 Iron deficiency anemia, unspecified: Secondary | ICD-10-CM | POA: Diagnosis not present

## 2018-07-20 DIAGNOSIS — Z4932 Encounter for adequacy testing for peritoneal dialysis: Secondary | ICD-10-CM | POA: Diagnosis not present

## 2018-07-20 DIAGNOSIS — E7841 Elevated Lipoprotein(a): Secondary | ICD-10-CM | POA: Diagnosis not present

## 2018-07-20 DIAGNOSIS — D631 Anemia in chronic kidney disease: Secondary | ICD-10-CM | POA: Diagnosis not present

## 2018-07-21 DIAGNOSIS — Z4932 Encounter for adequacy testing for peritoneal dialysis: Secondary | ICD-10-CM | POA: Diagnosis not present

## 2018-07-21 DIAGNOSIS — D631 Anemia in chronic kidney disease: Secondary | ICD-10-CM | POA: Diagnosis not present

## 2018-07-21 DIAGNOSIS — N186 End stage renal disease: Secondary | ICD-10-CM | POA: Diagnosis not present

## 2018-07-21 DIAGNOSIS — D509 Iron deficiency anemia, unspecified: Secondary | ICD-10-CM | POA: Diagnosis not present

## 2018-07-21 DIAGNOSIS — N2581 Secondary hyperparathyroidism of renal origin: Secondary | ICD-10-CM | POA: Diagnosis not present

## 2018-07-21 DIAGNOSIS — E7841 Elevated Lipoprotein(a): Secondary | ICD-10-CM | POA: Diagnosis not present

## 2018-07-22 DIAGNOSIS — D631 Anemia in chronic kidney disease: Secondary | ICD-10-CM | POA: Diagnosis not present

## 2018-07-22 DIAGNOSIS — D509 Iron deficiency anemia, unspecified: Secondary | ICD-10-CM | POA: Diagnosis not present

## 2018-07-22 DIAGNOSIS — E7841 Elevated Lipoprotein(a): Secondary | ICD-10-CM | POA: Diagnosis not present

## 2018-07-22 DIAGNOSIS — N2581 Secondary hyperparathyroidism of renal origin: Secondary | ICD-10-CM | POA: Diagnosis not present

## 2018-07-22 DIAGNOSIS — Z4932 Encounter for adequacy testing for peritoneal dialysis: Secondary | ICD-10-CM | POA: Diagnosis not present

## 2018-07-22 DIAGNOSIS — N186 End stage renal disease: Secondary | ICD-10-CM | POA: Diagnosis not present

## 2018-07-23 DIAGNOSIS — N186 End stage renal disease: Secondary | ICD-10-CM | POA: Diagnosis not present

## 2018-07-23 DIAGNOSIS — Z4932 Encounter for adequacy testing for peritoneal dialysis: Secondary | ICD-10-CM | POA: Diagnosis not present

## 2018-07-23 DIAGNOSIS — D509 Iron deficiency anemia, unspecified: Secondary | ICD-10-CM | POA: Diagnosis not present

## 2018-07-23 DIAGNOSIS — D631 Anemia in chronic kidney disease: Secondary | ICD-10-CM | POA: Diagnosis not present

## 2018-07-23 DIAGNOSIS — N2581 Secondary hyperparathyroidism of renal origin: Secondary | ICD-10-CM | POA: Diagnosis not present

## 2018-07-23 DIAGNOSIS — E7841 Elevated Lipoprotein(a): Secondary | ICD-10-CM | POA: Diagnosis not present

## 2018-07-24 DIAGNOSIS — N2581 Secondary hyperparathyroidism of renal origin: Secondary | ICD-10-CM | POA: Diagnosis not present

## 2018-07-24 DIAGNOSIS — D631 Anemia in chronic kidney disease: Secondary | ICD-10-CM | POA: Diagnosis not present

## 2018-07-24 DIAGNOSIS — D509 Iron deficiency anemia, unspecified: Secondary | ICD-10-CM | POA: Diagnosis not present

## 2018-07-24 DIAGNOSIS — E7841 Elevated Lipoprotein(a): Secondary | ICD-10-CM | POA: Diagnosis not present

## 2018-07-24 DIAGNOSIS — Z4932 Encounter for adequacy testing for peritoneal dialysis: Secondary | ICD-10-CM | POA: Diagnosis not present

## 2018-07-24 DIAGNOSIS — N186 End stage renal disease: Secondary | ICD-10-CM | POA: Diagnosis not present

## 2018-07-25 DIAGNOSIS — Z4932 Encounter for adequacy testing for peritoneal dialysis: Secondary | ICD-10-CM | POA: Diagnosis not present

## 2018-07-25 DIAGNOSIS — D631 Anemia in chronic kidney disease: Secondary | ICD-10-CM | POA: Diagnosis not present

## 2018-07-25 DIAGNOSIS — N186 End stage renal disease: Secondary | ICD-10-CM | POA: Diagnosis not present

## 2018-07-25 DIAGNOSIS — D509 Iron deficiency anemia, unspecified: Secondary | ICD-10-CM | POA: Diagnosis not present

## 2018-07-25 DIAGNOSIS — E7841 Elevated Lipoprotein(a): Secondary | ICD-10-CM | POA: Diagnosis not present

## 2018-07-25 DIAGNOSIS — N2581 Secondary hyperparathyroidism of renal origin: Secondary | ICD-10-CM | POA: Diagnosis not present

## 2018-07-26 DIAGNOSIS — D509 Iron deficiency anemia, unspecified: Secondary | ICD-10-CM | POA: Diagnosis not present

## 2018-07-26 DIAGNOSIS — N186 End stage renal disease: Secondary | ICD-10-CM | POA: Diagnosis not present

## 2018-07-26 DIAGNOSIS — D631 Anemia in chronic kidney disease: Secondary | ICD-10-CM | POA: Diagnosis not present

## 2018-07-26 DIAGNOSIS — E7841 Elevated Lipoprotein(a): Secondary | ICD-10-CM | POA: Diagnosis not present

## 2018-07-26 DIAGNOSIS — N2581 Secondary hyperparathyroidism of renal origin: Secondary | ICD-10-CM | POA: Diagnosis not present

## 2018-07-26 DIAGNOSIS — Z4932 Encounter for adequacy testing for peritoneal dialysis: Secondary | ICD-10-CM | POA: Diagnosis not present

## 2018-07-27 DIAGNOSIS — Z4932 Encounter for adequacy testing for peritoneal dialysis: Secondary | ICD-10-CM | POA: Diagnosis not present

## 2018-07-27 DIAGNOSIS — N186 End stage renal disease: Secondary | ICD-10-CM | POA: Diagnosis not present

## 2018-07-27 DIAGNOSIS — N2581 Secondary hyperparathyroidism of renal origin: Secondary | ICD-10-CM | POA: Diagnosis not present

## 2018-07-27 DIAGNOSIS — D631 Anemia in chronic kidney disease: Secondary | ICD-10-CM | POA: Diagnosis not present

## 2018-07-27 DIAGNOSIS — D509 Iron deficiency anemia, unspecified: Secondary | ICD-10-CM | POA: Diagnosis not present

## 2018-07-27 DIAGNOSIS — E7841 Elevated Lipoprotein(a): Secondary | ICD-10-CM | POA: Diagnosis not present

## 2018-07-28 DIAGNOSIS — D509 Iron deficiency anemia, unspecified: Secondary | ICD-10-CM | POA: Diagnosis not present

## 2018-07-28 DIAGNOSIS — N2581 Secondary hyperparathyroidism of renal origin: Secondary | ICD-10-CM | POA: Diagnosis not present

## 2018-07-28 DIAGNOSIS — Z4932 Encounter for adequacy testing for peritoneal dialysis: Secondary | ICD-10-CM | POA: Diagnosis not present

## 2018-07-28 DIAGNOSIS — D631 Anemia in chronic kidney disease: Secondary | ICD-10-CM | POA: Diagnosis not present

## 2018-07-28 DIAGNOSIS — E7841 Elevated Lipoprotein(a): Secondary | ICD-10-CM | POA: Diagnosis not present

## 2018-07-28 DIAGNOSIS — N186 End stage renal disease: Secondary | ICD-10-CM | POA: Diagnosis not present

## 2018-07-29 DIAGNOSIS — E7841 Elevated Lipoprotein(a): Secondary | ICD-10-CM | POA: Diagnosis not present

## 2018-07-29 DIAGNOSIS — Z4932 Encounter for adequacy testing for peritoneal dialysis: Secondary | ICD-10-CM | POA: Diagnosis not present

## 2018-07-29 DIAGNOSIS — N186 End stage renal disease: Secondary | ICD-10-CM | POA: Diagnosis not present

## 2018-07-29 DIAGNOSIS — N2581 Secondary hyperparathyroidism of renal origin: Secondary | ICD-10-CM | POA: Diagnosis not present

## 2018-07-29 DIAGNOSIS — D509 Iron deficiency anemia, unspecified: Secondary | ICD-10-CM | POA: Diagnosis not present

## 2018-07-29 DIAGNOSIS — D631 Anemia in chronic kidney disease: Secondary | ICD-10-CM | POA: Diagnosis not present

## 2018-07-30 DIAGNOSIS — D509 Iron deficiency anemia, unspecified: Secondary | ICD-10-CM | POA: Diagnosis not present

## 2018-07-30 DIAGNOSIS — E7841 Elevated Lipoprotein(a): Secondary | ICD-10-CM | POA: Diagnosis not present

## 2018-07-30 DIAGNOSIS — Z4932 Encounter for adequacy testing for peritoneal dialysis: Secondary | ICD-10-CM | POA: Diagnosis not present

## 2018-07-30 DIAGNOSIS — D631 Anemia in chronic kidney disease: Secondary | ICD-10-CM | POA: Diagnosis not present

## 2018-07-30 DIAGNOSIS — N186 End stage renal disease: Secondary | ICD-10-CM | POA: Diagnosis not present

## 2018-07-30 DIAGNOSIS — N2581 Secondary hyperparathyroidism of renal origin: Secondary | ICD-10-CM | POA: Diagnosis not present

## 2018-07-30 IMAGING — CR DG CHEST 2V
2 series · 2 of 2 positions shown · non-contrast
Comparison: None.

CLINICAL DATA: Renal failure patient.  Shortness of breath.

EXAM:
CHEST  2 VIEW

[chest lat]
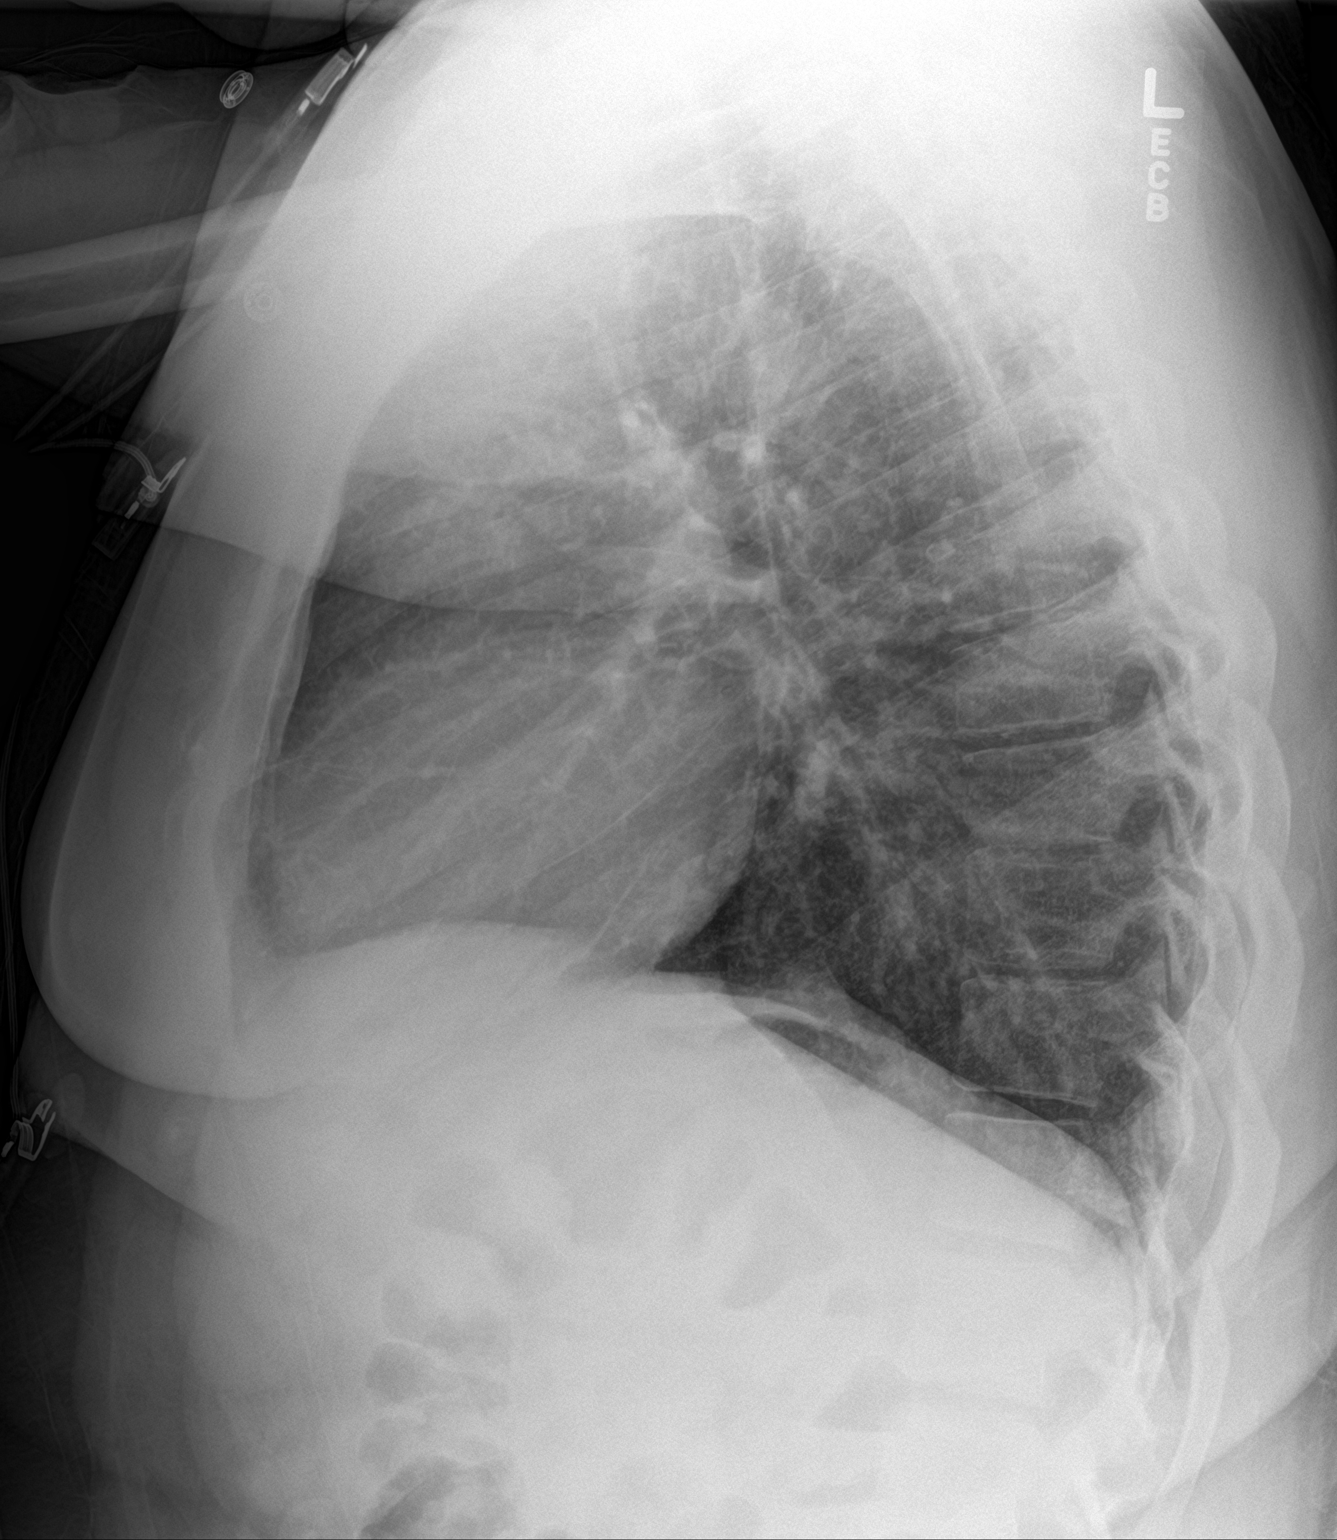

[chest ap]
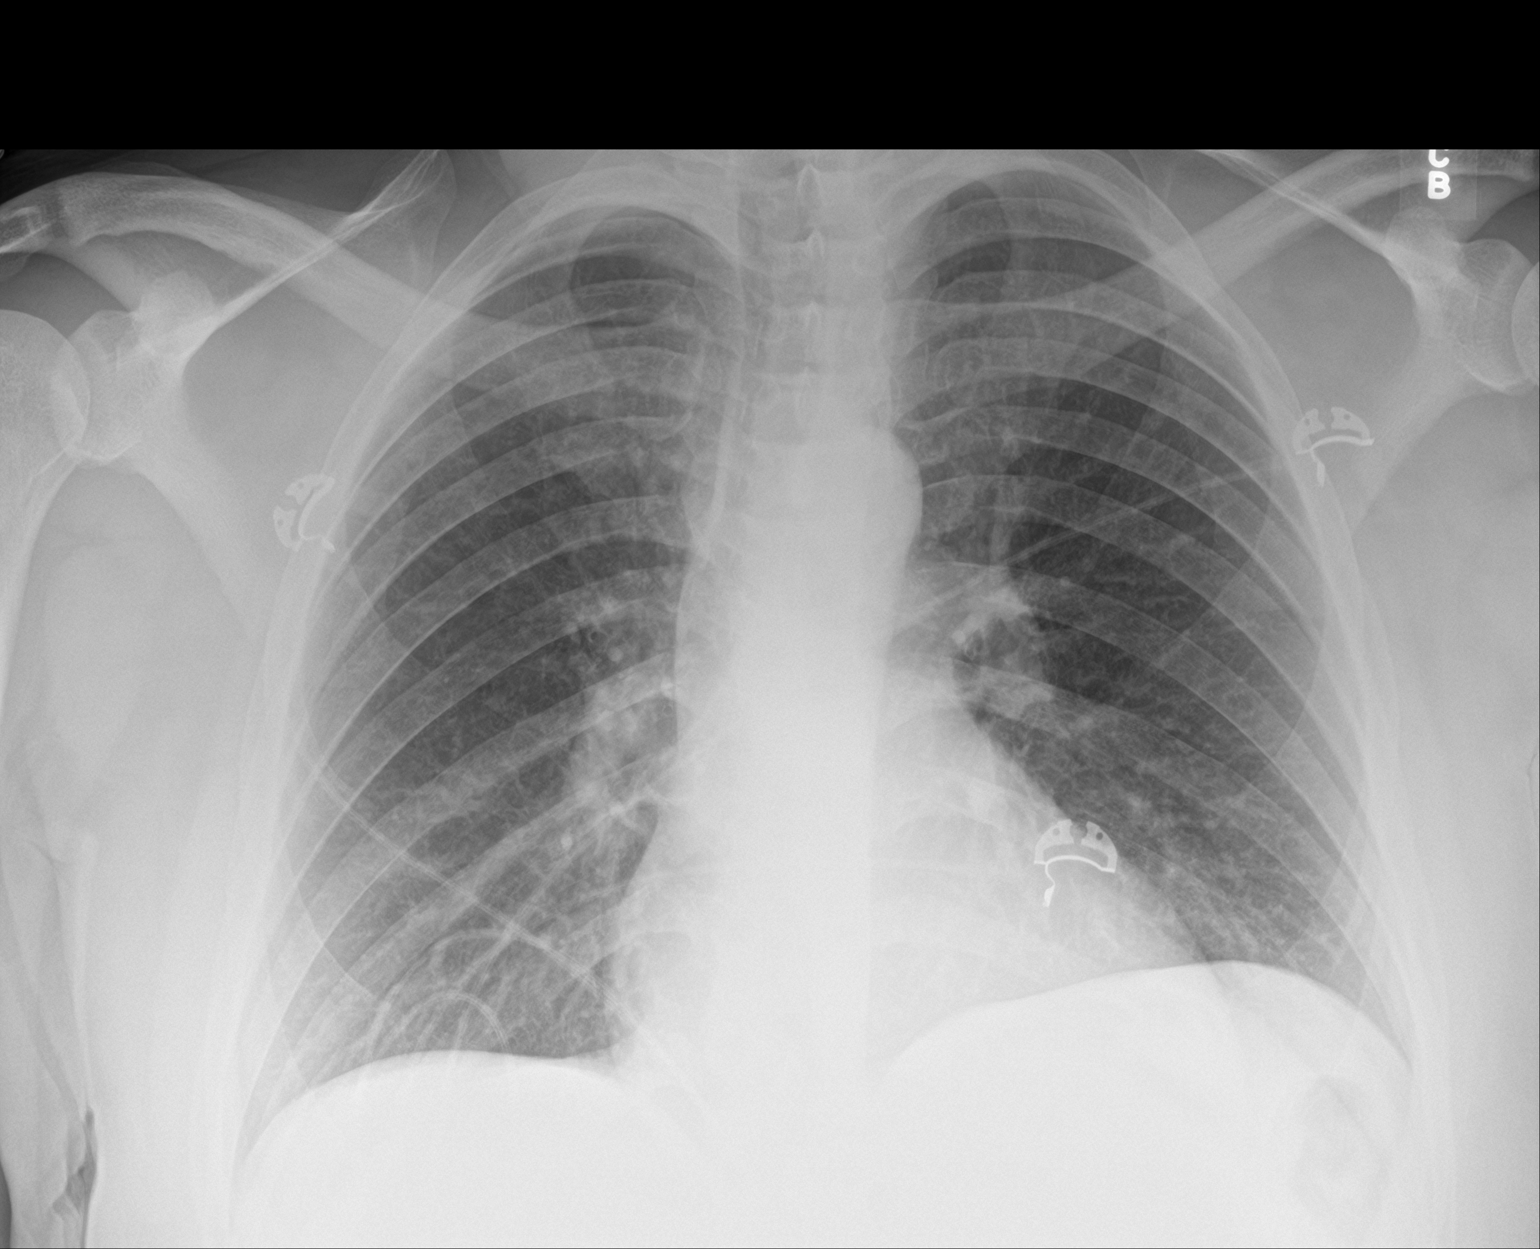

[2 of 2 positions shown; findings below may reference images not displayed]

FINDINGS: Heart size upper limits of normal. Mediastinal shadows are normal.
There is venous hypertension with early interstitial edema. No
infiltrate, collapse or effusion. Bony structures are unremarkable.
IMPRESSION: Borderline cardiomegaly. Pulmonary venous hypertension with mild
interstitial edema.

## 2018-07-31 DIAGNOSIS — D631 Anemia in chronic kidney disease: Secondary | ICD-10-CM | POA: Diagnosis not present

## 2018-07-31 DIAGNOSIS — N2581 Secondary hyperparathyroidism of renal origin: Secondary | ICD-10-CM | POA: Diagnosis not present

## 2018-07-31 DIAGNOSIS — Z4932 Encounter for adequacy testing for peritoneal dialysis: Secondary | ICD-10-CM | POA: Diagnosis not present

## 2018-07-31 DIAGNOSIS — N186 End stage renal disease: Secondary | ICD-10-CM | POA: Diagnosis not present

## 2018-07-31 DIAGNOSIS — D509 Iron deficiency anemia, unspecified: Secondary | ICD-10-CM | POA: Diagnosis not present

## 2018-07-31 DIAGNOSIS — E7841 Elevated Lipoprotein(a): Secondary | ICD-10-CM | POA: Diagnosis not present

## 2018-08-01 DIAGNOSIS — N2581 Secondary hyperparathyroidism of renal origin: Secondary | ICD-10-CM | POA: Diagnosis not present

## 2018-08-01 DIAGNOSIS — D631 Anemia in chronic kidney disease: Secondary | ICD-10-CM | POA: Diagnosis not present

## 2018-08-01 DIAGNOSIS — E7841 Elevated Lipoprotein(a): Secondary | ICD-10-CM | POA: Diagnosis not present

## 2018-08-01 DIAGNOSIS — D509 Iron deficiency anemia, unspecified: Secondary | ICD-10-CM | POA: Diagnosis not present

## 2018-08-01 DIAGNOSIS — N186 End stage renal disease: Secondary | ICD-10-CM | POA: Diagnosis not present

## 2018-08-01 DIAGNOSIS — Z4932 Encounter for adequacy testing for peritoneal dialysis: Secondary | ICD-10-CM | POA: Diagnosis not present

## 2018-08-02 DIAGNOSIS — N186 End stage renal disease: Secondary | ICD-10-CM | POA: Diagnosis not present

## 2018-08-02 DIAGNOSIS — D631 Anemia in chronic kidney disease: Secondary | ICD-10-CM | POA: Diagnosis not present

## 2018-08-02 DIAGNOSIS — D509 Iron deficiency anemia, unspecified: Secondary | ICD-10-CM | POA: Diagnosis not present

## 2018-08-02 DIAGNOSIS — N2581 Secondary hyperparathyroidism of renal origin: Secondary | ICD-10-CM | POA: Diagnosis not present

## 2018-08-02 DIAGNOSIS — Z4932 Encounter for adequacy testing for peritoneal dialysis: Secondary | ICD-10-CM | POA: Diagnosis not present

## 2018-08-02 DIAGNOSIS — E7841 Elevated Lipoprotein(a): Secondary | ICD-10-CM | POA: Diagnosis not present

## 2018-08-03 DIAGNOSIS — D509 Iron deficiency anemia, unspecified: Secondary | ICD-10-CM | POA: Diagnosis not present

## 2018-08-03 DIAGNOSIS — N186 End stage renal disease: Secondary | ICD-10-CM | POA: Diagnosis not present

## 2018-08-03 DIAGNOSIS — E7841 Elevated Lipoprotein(a): Secondary | ICD-10-CM | POA: Diagnosis not present

## 2018-08-03 DIAGNOSIS — D631 Anemia in chronic kidney disease: Secondary | ICD-10-CM | POA: Diagnosis not present

## 2018-08-03 DIAGNOSIS — N2581 Secondary hyperparathyroidism of renal origin: Secondary | ICD-10-CM | POA: Diagnosis not present

## 2018-08-03 DIAGNOSIS — Z4932 Encounter for adequacy testing for peritoneal dialysis: Secondary | ICD-10-CM | POA: Diagnosis not present

## 2018-08-04 DIAGNOSIS — N2581 Secondary hyperparathyroidism of renal origin: Secondary | ICD-10-CM | POA: Diagnosis not present

## 2018-08-04 DIAGNOSIS — E7841 Elevated Lipoprotein(a): Secondary | ICD-10-CM | POA: Diagnosis not present

## 2018-08-04 DIAGNOSIS — N186 End stage renal disease: Secondary | ICD-10-CM | POA: Diagnosis not present

## 2018-08-04 DIAGNOSIS — D631 Anemia in chronic kidney disease: Secondary | ICD-10-CM | POA: Diagnosis not present

## 2018-08-04 DIAGNOSIS — D509 Iron deficiency anemia, unspecified: Secondary | ICD-10-CM | POA: Diagnosis not present

## 2018-08-04 DIAGNOSIS — Z4932 Encounter for adequacy testing for peritoneal dialysis: Secondary | ICD-10-CM | POA: Diagnosis not present

## 2018-08-05 DIAGNOSIS — N186 End stage renal disease: Secondary | ICD-10-CM | POA: Diagnosis not present

## 2018-08-05 DIAGNOSIS — I129 Hypertensive chronic kidney disease with stage 1 through stage 4 chronic kidney disease, or unspecified chronic kidney disease: Secondary | ICD-10-CM | POA: Diagnosis not present

## 2018-08-05 DIAGNOSIS — D631 Anemia in chronic kidney disease: Secondary | ICD-10-CM | POA: Diagnosis not present

## 2018-08-05 DIAGNOSIS — D509 Iron deficiency anemia, unspecified: Secondary | ICD-10-CM | POA: Diagnosis not present

## 2018-08-05 DIAGNOSIS — Z4932 Encounter for adequacy testing for peritoneal dialysis: Secondary | ICD-10-CM | POA: Diagnosis not present

## 2018-08-05 DIAGNOSIS — Z992 Dependence on renal dialysis: Secondary | ICD-10-CM | POA: Diagnosis not present

## 2018-08-05 DIAGNOSIS — N2581 Secondary hyperparathyroidism of renal origin: Secondary | ICD-10-CM | POA: Diagnosis not present

## 2018-08-05 DIAGNOSIS — E7841 Elevated Lipoprotein(a): Secondary | ICD-10-CM | POA: Diagnosis not present

## 2018-08-06 DIAGNOSIS — N2581 Secondary hyperparathyroidism of renal origin: Secondary | ICD-10-CM | POA: Diagnosis not present

## 2018-08-06 DIAGNOSIS — Z992 Dependence on renal dialysis: Secondary | ICD-10-CM | POA: Diagnosis not present

## 2018-08-06 DIAGNOSIS — Z79899 Other long term (current) drug therapy: Secondary | ICD-10-CM | POA: Diagnosis not present

## 2018-08-06 DIAGNOSIS — I129 Hypertensive chronic kidney disease with stage 1 through stage 4 chronic kidney disease, or unspecified chronic kidney disease: Secondary | ICD-10-CM | POA: Diagnosis not present

## 2018-08-06 DIAGNOSIS — E7841 Elevated Lipoprotein(a): Secondary | ICD-10-CM | POA: Diagnosis not present

## 2018-08-06 DIAGNOSIS — K769 Liver disease, unspecified: Secondary | ICD-10-CM | POA: Diagnosis not present

## 2018-08-06 DIAGNOSIS — N186 End stage renal disease: Secondary | ICD-10-CM | POA: Diagnosis not present

## 2018-08-06 DIAGNOSIS — D631 Anemia in chronic kidney disease: Secondary | ICD-10-CM | POA: Diagnosis not present

## 2018-08-06 DIAGNOSIS — D509 Iron deficiency anemia, unspecified: Secondary | ICD-10-CM | POA: Diagnosis not present

## 2018-08-06 DIAGNOSIS — E440 Moderate protein-calorie malnutrition: Secondary | ICD-10-CM | POA: Diagnosis not present

## 2018-08-07 DIAGNOSIS — E440 Moderate protein-calorie malnutrition: Secondary | ICD-10-CM | POA: Diagnosis not present

## 2018-08-07 DIAGNOSIS — D509 Iron deficiency anemia, unspecified: Secondary | ICD-10-CM | POA: Diagnosis not present

## 2018-08-07 DIAGNOSIS — N2581 Secondary hyperparathyroidism of renal origin: Secondary | ICD-10-CM | POA: Diagnosis not present

## 2018-08-07 DIAGNOSIS — E7841 Elevated Lipoprotein(a): Secondary | ICD-10-CM | POA: Diagnosis not present

## 2018-08-07 DIAGNOSIS — D631 Anemia in chronic kidney disease: Secondary | ICD-10-CM | POA: Diagnosis not present

## 2018-08-07 DIAGNOSIS — N186 End stage renal disease: Secondary | ICD-10-CM | POA: Diagnosis not present

## 2018-08-08 DIAGNOSIS — N186 End stage renal disease: Secondary | ICD-10-CM | POA: Diagnosis not present

## 2018-08-08 DIAGNOSIS — N2581 Secondary hyperparathyroidism of renal origin: Secondary | ICD-10-CM | POA: Diagnosis not present

## 2018-08-08 DIAGNOSIS — E7841 Elevated Lipoprotein(a): Secondary | ICD-10-CM | POA: Diagnosis not present

## 2018-08-08 DIAGNOSIS — D631 Anemia in chronic kidney disease: Secondary | ICD-10-CM | POA: Diagnosis not present

## 2018-08-08 DIAGNOSIS — E440 Moderate protein-calorie malnutrition: Secondary | ICD-10-CM | POA: Diagnosis not present

## 2018-08-08 DIAGNOSIS — D509 Iron deficiency anemia, unspecified: Secondary | ICD-10-CM | POA: Diagnosis not present

## 2018-08-09 DIAGNOSIS — N186 End stage renal disease: Secondary | ICD-10-CM | POA: Diagnosis not present

## 2018-08-09 DIAGNOSIS — D509 Iron deficiency anemia, unspecified: Secondary | ICD-10-CM | POA: Diagnosis not present

## 2018-08-09 DIAGNOSIS — E7841 Elevated Lipoprotein(a): Secondary | ICD-10-CM | POA: Diagnosis not present

## 2018-08-09 DIAGNOSIS — N2581 Secondary hyperparathyroidism of renal origin: Secondary | ICD-10-CM | POA: Diagnosis not present

## 2018-08-09 DIAGNOSIS — D631 Anemia in chronic kidney disease: Secondary | ICD-10-CM | POA: Diagnosis not present

## 2018-08-09 DIAGNOSIS — E440 Moderate protein-calorie malnutrition: Secondary | ICD-10-CM | POA: Diagnosis not present

## 2018-08-10 DIAGNOSIS — D631 Anemia in chronic kidney disease: Secondary | ICD-10-CM | POA: Diagnosis not present

## 2018-08-10 DIAGNOSIS — E440 Moderate protein-calorie malnutrition: Secondary | ICD-10-CM | POA: Diagnosis not present

## 2018-08-10 DIAGNOSIS — E7841 Elevated Lipoprotein(a): Secondary | ICD-10-CM | POA: Diagnosis not present

## 2018-08-10 DIAGNOSIS — D509 Iron deficiency anemia, unspecified: Secondary | ICD-10-CM | POA: Diagnosis not present

## 2018-08-10 DIAGNOSIS — N2581 Secondary hyperparathyroidism of renal origin: Secondary | ICD-10-CM | POA: Diagnosis not present

## 2018-08-10 DIAGNOSIS — N186 End stage renal disease: Secondary | ICD-10-CM | POA: Diagnosis not present

## 2018-08-11 DIAGNOSIS — D509 Iron deficiency anemia, unspecified: Secondary | ICD-10-CM | POA: Diagnosis not present

## 2018-08-11 DIAGNOSIS — E7841 Elevated Lipoprotein(a): Secondary | ICD-10-CM | POA: Diagnosis not present

## 2018-08-11 DIAGNOSIS — E440 Moderate protein-calorie malnutrition: Secondary | ICD-10-CM | POA: Diagnosis not present

## 2018-08-11 DIAGNOSIS — N186 End stage renal disease: Secondary | ICD-10-CM | POA: Diagnosis not present

## 2018-08-11 DIAGNOSIS — N2581 Secondary hyperparathyroidism of renal origin: Secondary | ICD-10-CM | POA: Diagnosis not present

## 2018-08-11 DIAGNOSIS — D631 Anemia in chronic kidney disease: Secondary | ICD-10-CM | POA: Diagnosis not present

## 2018-08-12 DIAGNOSIS — D631 Anemia in chronic kidney disease: Secondary | ICD-10-CM | POA: Diagnosis not present

## 2018-08-12 DIAGNOSIS — N2581 Secondary hyperparathyroidism of renal origin: Secondary | ICD-10-CM | POA: Diagnosis not present

## 2018-08-12 DIAGNOSIS — D509 Iron deficiency anemia, unspecified: Secondary | ICD-10-CM | POA: Diagnosis not present

## 2018-08-12 DIAGNOSIS — N186 End stage renal disease: Secondary | ICD-10-CM | POA: Diagnosis not present

## 2018-08-12 DIAGNOSIS — E7841 Elevated Lipoprotein(a): Secondary | ICD-10-CM | POA: Diagnosis not present

## 2018-08-12 DIAGNOSIS — N2589 Other disorders resulting from impaired renal tubular function: Secondary | ICD-10-CM | POA: Diagnosis not present

## 2018-08-12 DIAGNOSIS — E440 Moderate protein-calorie malnutrition: Secondary | ICD-10-CM | POA: Diagnosis not present

## 2018-08-13 DIAGNOSIS — N186 End stage renal disease: Secondary | ICD-10-CM | POA: Diagnosis not present

## 2018-08-13 DIAGNOSIS — N2581 Secondary hyperparathyroidism of renal origin: Secondary | ICD-10-CM | POA: Diagnosis not present

## 2018-08-13 DIAGNOSIS — D631 Anemia in chronic kidney disease: Secondary | ICD-10-CM | POA: Diagnosis not present

## 2018-08-13 DIAGNOSIS — E440 Moderate protein-calorie malnutrition: Secondary | ICD-10-CM | POA: Diagnosis not present

## 2018-08-13 DIAGNOSIS — D509 Iron deficiency anemia, unspecified: Secondary | ICD-10-CM | POA: Diagnosis not present

## 2018-08-13 DIAGNOSIS — E7841 Elevated Lipoprotein(a): Secondary | ICD-10-CM | POA: Diagnosis not present

## 2018-08-14 DIAGNOSIS — N186 End stage renal disease: Secondary | ICD-10-CM | POA: Diagnosis not present

## 2018-08-14 DIAGNOSIS — D631 Anemia in chronic kidney disease: Secondary | ICD-10-CM | POA: Diagnosis not present

## 2018-08-14 DIAGNOSIS — N2581 Secondary hyperparathyroidism of renal origin: Secondary | ICD-10-CM | POA: Diagnosis not present

## 2018-08-14 DIAGNOSIS — E440 Moderate protein-calorie malnutrition: Secondary | ICD-10-CM | POA: Diagnosis not present

## 2018-08-14 DIAGNOSIS — E7841 Elevated Lipoprotein(a): Secondary | ICD-10-CM | POA: Diagnosis not present

## 2018-08-14 DIAGNOSIS — D509 Iron deficiency anemia, unspecified: Secondary | ICD-10-CM | POA: Diagnosis not present

## 2018-08-15 DIAGNOSIS — N186 End stage renal disease: Secondary | ICD-10-CM | POA: Diagnosis not present

## 2018-08-15 DIAGNOSIS — D631 Anemia in chronic kidney disease: Secondary | ICD-10-CM | POA: Diagnosis not present

## 2018-08-15 DIAGNOSIS — E440 Moderate protein-calorie malnutrition: Secondary | ICD-10-CM | POA: Diagnosis not present

## 2018-08-15 DIAGNOSIS — D509 Iron deficiency anemia, unspecified: Secondary | ICD-10-CM | POA: Diagnosis not present

## 2018-08-15 DIAGNOSIS — E7841 Elevated Lipoprotein(a): Secondary | ICD-10-CM | POA: Diagnosis not present

## 2018-08-15 DIAGNOSIS — N2581 Secondary hyperparathyroidism of renal origin: Secondary | ICD-10-CM | POA: Diagnosis not present

## 2018-08-16 DIAGNOSIS — D509 Iron deficiency anemia, unspecified: Secondary | ICD-10-CM | POA: Diagnosis not present

## 2018-08-16 DIAGNOSIS — D631 Anemia in chronic kidney disease: Secondary | ICD-10-CM | POA: Diagnosis not present

## 2018-08-16 DIAGNOSIS — E440 Moderate protein-calorie malnutrition: Secondary | ICD-10-CM | POA: Diagnosis not present

## 2018-08-16 DIAGNOSIS — E7841 Elevated Lipoprotein(a): Secondary | ICD-10-CM | POA: Diagnosis not present

## 2018-08-16 DIAGNOSIS — N186 End stage renal disease: Secondary | ICD-10-CM | POA: Diagnosis not present

## 2018-08-16 DIAGNOSIS — N2581 Secondary hyperparathyroidism of renal origin: Secondary | ICD-10-CM | POA: Diagnosis not present

## 2018-08-17 DIAGNOSIS — N186 End stage renal disease: Secondary | ICD-10-CM | POA: Diagnosis not present

## 2018-08-17 DIAGNOSIS — D631 Anemia in chronic kidney disease: Secondary | ICD-10-CM | POA: Diagnosis not present

## 2018-08-17 DIAGNOSIS — D509 Iron deficiency anemia, unspecified: Secondary | ICD-10-CM | POA: Diagnosis not present

## 2018-08-17 DIAGNOSIS — E7841 Elevated Lipoprotein(a): Secondary | ICD-10-CM | POA: Diagnosis not present

## 2018-08-17 DIAGNOSIS — E440 Moderate protein-calorie malnutrition: Secondary | ICD-10-CM | POA: Diagnosis not present

## 2018-08-17 DIAGNOSIS — N2581 Secondary hyperparathyroidism of renal origin: Secondary | ICD-10-CM | POA: Diagnosis not present

## 2018-08-18 DIAGNOSIS — D509 Iron deficiency anemia, unspecified: Secondary | ICD-10-CM | POA: Diagnosis not present

## 2018-08-18 DIAGNOSIS — E440 Moderate protein-calorie malnutrition: Secondary | ICD-10-CM | POA: Diagnosis not present

## 2018-08-18 DIAGNOSIS — N2581 Secondary hyperparathyroidism of renal origin: Secondary | ICD-10-CM | POA: Diagnosis not present

## 2018-08-18 DIAGNOSIS — D631 Anemia in chronic kidney disease: Secondary | ICD-10-CM | POA: Diagnosis not present

## 2018-08-18 DIAGNOSIS — N186 End stage renal disease: Secondary | ICD-10-CM | POA: Diagnosis not present

## 2018-08-18 DIAGNOSIS — E7841 Elevated Lipoprotein(a): Secondary | ICD-10-CM | POA: Diagnosis not present

## 2018-08-19 DIAGNOSIS — N2581 Secondary hyperparathyroidism of renal origin: Secondary | ICD-10-CM | POA: Diagnosis not present

## 2018-08-19 DIAGNOSIS — E440 Moderate protein-calorie malnutrition: Secondary | ICD-10-CM | POA: Diagnosis not present

## 2018-08-19 DIAGNOSIS — D631 Anemia in chronic kidney disease: Secondary | ICD-10-CM | POA: Diagnosis not present

## 2018-08-19 DIAGNOSIS — N186 End stage renal disease: Secondary | ICD-10-CM | POA: Diagnosis not present

## 2018-08-19 DIAGNOSIS — E7841 Elevated Lipoprotein(a): Secondary | ICD-10-CM | POA: Diagnosis not present

## 2018-08-19 DIAGNOSIS — D509 Iron deficiency anemia, unspecified: Secondary | ICD-10-CM | POA: Diagnosis not present

## 2018-08-20 DIAGNOSIS — D509 Iron deficiency anemia, unspecified: Secondary | ICD-10-CM | POA: Diagnosis not present

## 2018-08-20 DIAGNOSIS — E7841 Elevated Lipoprotein(a): Secondary | ICD-10-CM | POA: Diagnosis not present

## 2018-08-20 DIAGNOSIS — D631 Anemia in chronic kidney disease: Secondary | ICD-10-CM | POA: Diagnosis not present

## 2018-08-20 DIAGNOSIS — N2581 Secondary hyperparathyroidism of renal origin: Secondary | ICD-10-CM | POA: Diagnosis not present

## 2018-08-20 DIAGNOSIS — N186 End stage renal disease: Secondary | ICD-10-CM | POA: Diagnosis not present

## 2018-08-20 DIAGNOSIS — E440 Moderate protein-calorie malnutrition: Secondary | ICD-10-CM | POA: Diagnosis not present

## 2018-08-21 DIAGNOSIS — D631 Anemia in chronic kidney disease: Secondary | ICD-10-CM | POA: Diagnosis not present

## 2018-08-21 DIAGNOSIS — D509 Iron deficiency anemia, unspecified: Secondary | ICD-10-CM | POA: Diagnosis not present

## 2018-08-21 DIAGNOSIS — E7841 Elevated Lipoprotein(a): Secondary | ICD-10-CM | POA: Diagnosis not present

## 2018-08-21 DIAGNOSIS — N186 End stage renal disease: Secondary | ICD-10-CM | POA: Diagnosis not present

## 2018-08-21 DIAGNOSIS — E440 Moderate protein-calorie malnutrition: Secondary | ICD-10-CM | POA: Diagnosis not present

## 2018-08-21 DIAGNOSIS — N2581 Secondary hyperparathyroidism of renal origin: Secondary | ICD-10-CM | POA: Diagnosis not present

## 2018-08-22 DIAGNOSIS — E440 Moderate protein-calorie malnutrition: Secondary | ICD-10-CM | POA: Diagnosis not present

## 2018-08-22 DIAGNOSIS — D631 Anemia in chronic kidney disease: Secondary | ICD-10-CM | POA: Diagnosis not present

## 2018-08-22 DIAGNOSIS — N2581 Secondary hyperparathyroidism of renal origin: Secondary | ICD-10-CM | POA: Diagnosis not present

## 2018-08-22 DIAGNOSIS — N186 End stage renal disease: Secondary | ICD-10-CM | POA: Diagnosis not present

## 2018-08-22 DIAGNOSIS — E7841 Elevated Lipoprotein(a): Secondary | ICD-10-CM | POA: Diagnosis not present

## 2018-08-22 DIAGNOSIS — D509 Iron deficiency anemia, unspecified: Secondary | ICD-10-CM | POA: Diagnosis not present

## 2018-08-23 DIAGNOSIS — D631 Anemia in chronic kidney disease: Secondary | ICD-10-CM | POA: Diagnosis not present

## 2018-08-23 DIAGNOSIS — E7841 Elevated Lipoprotein(a): Secondary | ICD-10-CM | POA: Diagnosis not present

## 2018-08-23 DIAGNOSIS — N186 End stage renal disease: Secondary | ICD-10-CM | POA: Diagnosis not present

## 2018-08-23 DIAGNOSIS — E440 Moderate protein-calorie malnutrition: Secondary | ICD-10-CM | POA: Diagnosis not present

## 2018-08-23 DIAGNOSIS — D509 Iron deficiency anemia, unspecified: Secondary | ICD-10-CM | POA: Diagnosis not present

## 2018-08-23 DIAGNOSIS — N2581 Secondary hyperparathyroidism of renal origin: Secondary | ICD-10-CM | POA: Diagnosis not present

## 2018-08-24 DIAGNOSIS — D509 Iron deficiency anemia, unspecified: Secondary | ICD-10-CM | POA: Diagnosis not present

## 2018-08-24 DIAGNOSIS — N186 End stage renal disease: Secondary | ICD-10-CM | POA: Diagnosis not present

## 2018-08-24 DIAGNOSIS — E7841 Elevated Lipoprotein(a): Secondary | ICD-10-CM | POA: Diagnosis not present

## 2018-08-24 DIAGNOSIS — D631 Anemia in chronic kidney disease: Secondary | ICD-10-CM | POA: Diagnosis not present

## 2018-08-24 DIAGNOSIS — N2581 Secondary hyperparathyroidism of renal origin: Secondary | ICD-10-CM | POA: Diagnosis not present

## 2018-08-24 DIAGNOSIS — E440 Moderate protein-calorie malnutrition: Secondary | ICD-10-CM | POA: Diagnosis not present

## 2018-08-25 DIAGNOSIS — N186 End stage renal disease: Secondary | ICD-10-CM | POA: Diagnosis not present

## 2018-08-25 DIAGNOSIS — D631 Anemia in chronic kidney disease: Secondary | ICD-10-CM | POA: Diagnosis not present

## 2018-08-25 DIAGNOSIS — D509 Iron deficiency anemia, unspecified: Secondary | ICD-10-CM | POA: Diagnosis not present

## 2018-08-25 DIAGNOSIS — E7841 Elevated Lipoprotein(a): Secondary | ICD-10-CM | POA: Diagnosis not present

## 2018-08-25 DIAGNOSIS — N2581 Secondary hyperparathyroidism of renal origin: Secondary | ICD-10-CM | POA: Diagnosis not present

## 2018-08-25 DIAGNOSIS — E440 Moderate protein-calorie malnutrition: Secondary | ICD-10-CM | POA: Diagnosis not present

## 2018-08-26 DIAGNOSIS — E440 Moderate protein-calorie malnutrition: Secondary | ICD-10-CM | POA: Diagnosis not present

## 2018-08-26 DIAGNOSIS — N186 End stage renal disease: Secondary | ICD-10-CM | POA: Diagnosis not present

## 2018-08-26 DIAGNOSIS — E7841 Elevated Lipoprotein(a): Secondary | ICD-10-CM | POA: Diagnosis not present

## 2018-08-26 DIAGNOSIS — D631 Anemia in chronic kidney disease: Secondary | ICD-10-CM | POA: Diagnosis not present

## 2018-08-26 DIAGNOSIS — D509 Iron deficiency anemia, unspecified: Secondary | ICD-10-CM | POA: Diagnosis not present

## 2018-08-26 DIAGNOSIS — N2581 Secondary hyperparathyroidism of renal origin: Secondary | ICD-10-CM | POA: Diagnosis not present

## 2018-08-27 DIAGNOSIS — N186 End stage renal disease: Secondary | ICD-10-CM | POA: Diagnosis not present

## 2018-08-27 DIAGNOSIS — D631 Anemia in chronic kidney disease: Secondary | ICD-10-CM | POA: Diagnosis not present

## 2018-08-27 DIAGNOSIS — N2581 Secondary hyperparathyroidism of renal origin: Secondary | ICD-10-CM | POA: Diagnosis not present

## 2018-08-27 DIAGNOSIS — E440 Moderate protein-calorie malnutrition: Secondary | ICD-10-CM | POA: Diagnosis not present

## 2018-08-27 DIAGNOSIS — E7841 Elevated Lipoprotein(a): Secondary | ICD-10-CM | POA: Diagnosis not present

## 2018-08-27 DIAGNOSIS — D509 Iron deficiency anemia, unspecified: Secondary | ICD-10-CM | POA: Diagnosis not present

## 2018-08-28 DIAGNOSIS — D509 Iron deficiency anemia, unspecified: Secondary | ICD-10-CM | POA: Diagnosis not present

## 2018-08-28 DIAGNOSIS — E7841 Elevated Lipoprotein(a): Secondary | ICD-10-CM | POA: Diagnosis not present

## 2018-08-28 DIAGNOSIS — E440 Moderate protein-calorie malnutrition: Secondary | ICD-10-CM | POA: Diagnosis not present

## 2018-08-28 DIAGNOSIS — N186 End stage renal disease: Secondary | ICD-10-CM | POA: Diagnosis not present

## 2018-08-28 DIAGNOSIS — N2581 Secondary hyperparathyroidism of renal origin: Secondary | ICD-10-CM | POA: Diagnosis not present

## 2018-08-28 DIAGNOSIS — D631 Anemia in chronic kidney disease: Secondary | ICD-10-CM | POA: Diagnosis not present

## 2018-08-29 DIAGNOSIS — E440 Moderate protein-calorie malnutrition: Secondary | ICD-10-CM | POA: Diagnosis not present

## 2018-08-29 DIAGNOSIS — N2581 Secondary hyperparathyroidism of renal origin: Secondary | ICD-10-CM | POA: Diagnosis not present

## 2018-08-29 DIAGNOSIS — D631 Anemia in chronic kidney disease: Secondary | ICD-10-CM | POA: Diagnosis not present

## 2018-08-29 DIAGNOSIS — E7841 Elevated Lipoprotein(a): Secondary | ICD-10-CM | POA: Diagnosis not present

## 2018-08-29 DIAGNOSIS — N186 End stage renal disease: Secondary | ICD-10-CM | POA: Diagnosis not present

## 2018-08-29 DIAGNOSIS — D509 Iron deficiency anemia, unspecified: Secondary | ICD-10-CM | POA: Diagnosis not present

## 2018-08-30 DIAGNOSIS — N2581 Secondary hyperparathyroidism of renal origin: Secondary | ICD-10-CM | POA: Diagnosis not present

## 2018-08-30 DIAGNOSIS — N186 End stage renal disease: Secondary | ICD-10-CM | POA: Diagnosis not present

## 2018-08-30 DIAGNOSIS — D509 Iron deficiency anemia, unspecified: Secondary | ICD-10-CM | POA: Diagnosis not present

## 2018-08-30 DIAGNOSIS — D631 Anemia in chronic kidney disease: Secondary | ICD-10-CM | POA: Diagnosis not present

## 2018-08-30 DIAGNOSIS — E440 Moderate protein-calorie malnutrition: Secondary | ICD-10-CM | POA: Diagnosis not present

## 2018-08-30 DIAGNOSIS — E7841 Elevated Lipoprotein(a): Secondary | ICD-10-CM | POA: Diagnosis not present

## 2018-08-31 DIAGNOSIS — D631 Anemia in chronic kidney disease: Secondary | ICD-10-CM | POA: Diagnosis not present

## 2018-08-31 DIAGNOSIS — N186 End stage renal disease: Secondary | ICD-10-CM | POA: Diagnosis not present

## 2018-08-31 DIAGNOSIS — N2581 Secondary hyperparathyroidism of renal origin: Secondary | ICD-10-CM | POA: Diagnosis not present

## 2018-08-31 DIAGNOSIS — E7841 Elevated Lipoprotein(a): Secondary | ICD-10-CM | POA: Diagnosis not present

## 2018-08-31 DIAGNOSIS — E440 Moderate protein-calorie malnutrition: Secondary | ICD-10-CM | POA: Diagnosis not present

## 2018-08-31 DIAGNOSIS — D509 Iron deficiency anemia, unspecified: Secondary | ICD-10-CM | POA: Diagnosis not present

## 2018-09-01 DIAGNOSIS — E7841 Elevated Lipoprotein(a): Secondary | ICD-10-CM | POA: Diagnosis not present

## 2018-09-01 DIAGNOSIS — D631 Anemia in chronic kidney disease: Secondary | ICD-10-CM | POA: Diagnosis not present

## 2018-09-01 DIAGNOSIS — D509 Iron deficiency anemia, unspecified: Secondary | ICD-10-CM | POA: Diagnosis not present

## 2018-09-01 DIAGNOSIS — E440 Moderate protein-calorie malnutrition: Secondary | ICD-10-CM | POA: Diagnosis not present

## 2018-09-01 DIAGNOSIS — N186 End stage renal disease: Secondary | ICD-10-CM | POA: Diagnosis not present

## 2018-09-01 DIAGNOSIS — N2581 Secondary hyperparathyroidism of renal origin: Secondary | ICD-10-CM | POA: Diagnosis not present

## 2018-09-02 DIAGNOSIS — D509 Iron deficiency anemia, unspecified: Secondary | ICD-10-CM | POA: Diagnosis not present

## 2018-09-02 DIAGNOSIS — E7841 Elevated Lipoprotein(a): Secondary | ICD-10-CM | POA: Diagnosis not present

## 2018-09-02 DIAGNOSIS — N186 End stage renal disease: Secondary | ICD-10-CM | POA: Diagnosis not present

## 2018-09-02 DIAGNOSIS — E440 Moderate protein-calorie malnutrition: Secondary | ICD-10-CM | POA: Diagnosis not present

## 2018-09-02 DIAGNOSIS — D631 Anemia in chronic kidney disease: Secondary | ICD-10-CM | POA: Diagnosis not present

## 2018-09-02 DIAGNOSIS — N2581 Secondary hyperparathyroidism of renal origin: Secondary | ICD-10-CM | POA: Diagnosis not present

## 2018-09-03 DIAGNOSIS — D509 Iron deficiency anemia, unspecified: Secondary | ICD-10-CM | POA: Diagnosis not present

## 2018-09-03 DIAGNOSIS — D631 Anemia in chronic kidney disease: Secondary | ICD-10-CM | POA: Diagnosis not present

## 2018-09-03 DIAGNOSIS — E440 Moderate protein-calorie malnutrition: Secondary | ICD-10-CM | POA: Diagnosis not present

## 2018-09-03 DIAGNOSIS — N2581 Secondary hyperparathyroidism of renal origin: Secondary | ICD-10-CM | POA: Diagnosis not present

## 2018-09-03 DIAGNOSIS — E7841 Elevated Lipoprotein(a): Secondary | ICD-10-CM | POA: Diagnosis not present

## 2018-09-03 DIAGNOSIS — N186 End stage renal disease: Secondary | ICD-10-CM | POA: Diagnosis not present

## 2018-09-04 DIAGNOSIS — D631 Anemia in chronic kidney disease: Secondary | ICD-10-CM | POA: Diagnosis not present

## 2018-09-04 DIAGNOSIS — N2581 Secondary hyperparathyroidism of renal origin: Secondary | ICD-10-CM | POA: Diagnosis not present

## 2018-09-04 DIAGNOSIS — N186 End stage renal disease: Secondary | ICD-10-CM | POA: Diagnosis not present

## 2018-09-04 DIAGNOSIS — D509 Iron deficiency anemia, unspecified: Secondary | ICD-10-CM | POA: Diagnosis not present

## 2018-09-04 DIAGNOSIS — E440 Moderate protein-calorie malnutrition: Secondary | ICD-10-CM | POA: Diagnosis not present

## 2018-09-04 DIAGNOSIS — E7841 Elevated Lipoprotein(a): Secondary | ICD-10-CM | POA: Diagnosis not present

## 2018-09-05 DIAGNOSIS — E7841 Elevated Lipoprotein(a): Secondary | ICD-10-CM | POA: Diagnosis not present

## 2018-09-05 DIAGNOSIS — D631 Anemia in chronic kidney disease: Secondary | ICD-10-CM | POA: Diagnosis not present

## 2018-09-05 DIAGNOSIS — D509 Iron deficiency anemia, unspecified: Secondary | ICD-10-CM | POA: Diagnosis not present

## 2018-09-05 DIAGNOSIS — N186 End stage renal disease: Secondary | ICD-10-CM | POA: Diagnosis not present

## 2018-09-05 DIAGNOSIS — E440 Moderate protein-calorie malnutrition: Secondary | ICD-10-CM | POA: Diagnosis not present

## 2018-09-05 DIAGNOSIS — N2581 Secondary hyperparathyroidism of renal origin: Secondary | ICD-10-CM | POA: Diagnosis not present

## 2018-09-06 DIAGNOSIS — N2581 Secondary hyperparathyroidism of renal origin: Secondary | ICD-10-CM | POA: Diagnosis not present

## 2018-09-06 DIAGNOSIS — D509 Iron deficiency anemia, unspecified: Secondary | ICD-10-CM | POA: Diagnosis not present

## 2018-09-06 DIAGNOSIS — Z79899 Other long term (current) drug therapy: Secondary | ICD-10-CM | POA: Diagnosis not present

## 2018-09-06 DIAGNOSIS — K769 Liver disease, unspecified: Secondary | ICD-10-CM | POA: Diagnosis not present

## 2018-09-06 DIAGNOSIS — D631 Anemia in chronic kidney disease: Secondary | ICD-10-CM | POA: Diagnosis not present

## 2018-09-06 DIAGNOSIS — N186 End stage renal disease: Secondary | ICD-10-CM | POA: Diagnosis not present

## 2018-09-06 DIAGNOSIS — I129 Hypertensive chronic kidney disease with stage 1 through stage 4 chronic kidney disease, or unspecified chronic kidney disease: Secondary | ICD-10-CM | POA: Diagnosis not present

## 2018-09-06 DIAGNOSIS — E440 Moderate protein-calorie malnutrition: Secondary | ICD-10-CM | POA: Diagnosis not present

## 2018-09-06 DIAGNOSIS — Z992 Dependence on renal dialysis: Secondary | ICD-10-CM | POA: Diagnosis not present

## 2018-09-06 DIAGNOSIS — E7841 Elevated Lipoprotein(a): Secondary | ICD-10-CM | POA: Diagnosis not present

## 2018-09-07 DIAGNOSIS — N186 End stage renal disease: Secondary | ICD-10-CM | POA: Diagnosis not present

## 2018-09-07 DIAGNOSIS — D509 Iron deficiency anemia, unspecified: Secondary | ICD-10-CM | POA: Diagnosis not present

## 2018-09-07 DIAGNOSIS — D631 Anemia in chronic kidney disease: Secondary | ICD-10-CM | POA: Diagnosis not present

## 2018-09-07 DIAGNOSIS — N2581 Secondary hyperparathyroidism of renal origin: Secondary | ICD-10-CM | POA: Diagnosis not present

## 2018-09-07 DIAGNOSIS — E440 Moderate protein-calorie malnutrition: Secondary | ICD-10-CM | POA: Diagnosis not present

## 2018-09-07 DIAGNOSIS — K769 Liver disease, unspecified: Secondary | ICD-10-CM | POA: Diagnosis not present

## 2018-09-08 DIAGNOSIS — D631 Anemia in chronic kidney disease: Secondary | ICD-10-CM | POA: Diagnosis not present

## 2018-09-08 DIAGNOSIS — K769 Liver disease, unspecified: Secondary | ICD-10-CM | POA: Diagnosis not present

## 2018-09-08 DIAGNOSIS — N2581 Secondary hyperparathyroidism of renal origin: Secondary | ICD-10-CM | POA: Diagnosis not present

## 2018-09-08 DIAGNOSIS — E440 Moderate protein-calorie malnutrition: Secondary | ICD-10-CM | POA: Diagnosis not present

## 2018-09-08 DIAGNOSIS — N186 End stage renal disease: Secondary | ICD-10-CM | POA: Diagnosis not present

## 2018-09-08 DIAGNOSIS — D509 Iron deficiency anemia, unspecified: Secondary | ICD-10-CM | POA: Diagnosis not present

## 2018-09-09 DIAGNOSIS — N186 End stage renal disease: Secondary | ICD-10-CM | POA: Diagnosis not present

## 2018-09-09 DIAGNOSIS — K769 Liver disease, unspecified: Secondary | ICD-10-CM | POA: Diagnosis not present

## 2018-09-09 DIAGNOSIS — D631 Anemia in chronic kidney disease: Secondary | ICD-10-CM | POA: Diagnosis not present

## 2018-09-09 DIAGNOSIS — D509 Iron deficiency anemia, unspecified: Secondary | ICD-10-CM | POA: Diagnosis not present

## 2018-09-09 DIAGNOSIS — E440 Moderate protein-calorie malnutrition: Secondary | ICD-10-CM | POA: Diagnosis not present

## 2018-09-09 DIAGNOSIS — N2581 Secondary hyperparathyroidism of renal origin: Secondary | ICD-10-CM | POA: Diagnosis not present

## 2018-09-10 DIAGNOSIS — E440 Moderate protein-calorie malnutrition: Secondary | ICD-10-CM | POA: Diagnosis not present

## 2018-09-10 DIAGNOSIS — K769 Liver disease, unspecified: Secondary | ICD-10-CM | POA: Diagnosis not present

## 2018-09-10 DIAGNOSIS — D509 Iron deficiency anemia, unspecified: Secondary | ICD-10-CM | POA: Diagnosis not present

## 2018-09-10 DIAGNOSIS — N186 End stage renal disease: Secondary | ICD-10-CM | POA: Diagnosis not present

## 2018-09-10 DIAGNOSIS — N2581 Secondary hyperparathyroidism of renal origin: Secondary | ICD-10-CM | POA: Diagnosis not present

## 2018-09-10 DIAGNOSIS — D631 Anemia in chronic kidney disease: Secondary | ICD-10-CM | POA: Diagnosis not present

## 2018-09-11 DIAGNOSIS — N186 End stage renal disease: Secondary | ICD-10-CM | POA: Diagnosis not present

## 2018-09-11 DIAGNOSIS — N2581 Secondary hyperparathyroidism of renal origin: Secondary | ICD-10-CM | POA: Diagnosis not present

## 2018-09-11 DIAGNOSIS — K769 Liver disease, unspecified: Secondary | ICD-10-CM | POA: Diagnosis not present

## 2018-09-11 DIAGNOSIS — D631 Anemia in chronic kidney disease: Secondary | ICD-10-CM | POA: Diagnosis not present

## 2018-09-11 DIAGNOSIS — E440 Moderate protein-calorie malnutrition: Secondary | ICD-10-CM | POA: Diagnosis not present

## 2018-09-11 DIAGNOSIS — D509 Iron deficiency anemia, unspecified: Secondary | ICD-10-CM | POA: Diagnosis not present

## 2018-09-12 DIAGNOSIS — K769 Liver disease, unspecified: Secondary | ICD-10-CM | POA: Diagnosis not present

## 2018-09-12 DIAGNOSIS — N2581 Secondary hyperparathyroidism of renal origin: Secondary | ICD-10-CM | POA: Diagnosis not present

## 2018-09-12 DIAGNOSIS — D631 Anemia in chronic kidney disease: Secondary | ICD-10-CM | POA: Diagnosis not present

## 2018-09-12 DIAGNOSIS — N186 End stage renal disease: Secondary | ICD-10-CM | POA: Diagnosis not present

## 2018-09-12 DIAGNOSIS — D509 Iron deficiency anemia, unspecified: Secondary | ICD-10-CM | POA: Diagnosis not present

## 2018-09-12 DIAGNOSIS — E440 Moderate protein-calorie malnutrition: Secondary | ICD-10-CM | POA: Diagnosis not present

## 2018-09-13 DIAGNOSIS — K769 Liver disease, unspecified: Secondary | ICD-10-CM | POA: Diagnosis not present

## 2018-09-13 DIAGNOSIS — N2589 Other disorders resulting from impaired renal tubular function: Secondary | ICD-10-CM | POA: Diagnosis not present

## 2018-09-13 DIAGNOSIS — N186 End stage renal disease: Secondary | ICD-10-CM | POA: Diagnosis not present

## 2018-09-13 DIAGNOSIS — N2581 Secondary hyperparathyroidism of renal origin: Secondary | ICD-10-CM | POA: Diagnosis not present

## 2018-09-13 DIAGNOSIS — D631 Anemia in chronic kidney disease: Secondary | ICD-10-CM | POA: Diagnosis not present

## 2018-09-13 DIAGNOSIS — E440 Moderate protein-calorie malnutrition: Secondary | ICD-10-CM | POA: Diagnosis not present

## 2018-09-13 DIAGNOSIS — D509 Iron deficiency anemia, unspecified: Secondary | ICD-10-CM | POA: Diagnosis not present

## 2018-09-13 DIAGNOSIS — E7841 Elevated Lipoprotein(a): Secondary | ICD-10-CM | POA: Diagnosis not present

## 2018-09-14 DIAGNOSIS — N186 End stage renal disease: Secondary | ICD-10-CM | POA: Diagnosis not present

## 2018-09-14 DIAGNOSIS — K769 Liver disease, unspecified: Secondary | ICD-10-CM | POA: Diagnosis not present

## 2018-09-14 DIAGNOSIS — N2581 Secondary hyperparathyroidism of renal origin: Secondary | ICD-10-CM | POA: Diagnosis not present

## 2018-09-14 DIAGNOSIS — E440 Moderate protein-calorie malnutrition: Secondary | ICD-10-CM | POA: Diagnosis not present

## 2018-09-14 DIAGNOSIS — D631 Anemia in chronic kidney disease: Secondary | ICD-10-CM | POA: Diagnosis not present

## 2018-09-14 DIAGNOSIS — D509 Iron deficiency anemia, unspecified: Secondary | ICD-10-CM | POA: Diagnosis not present

## 2018-09-15 DIAGNOSIS — N186 End stage renal disease: Secondary | ICD-10-CM | POA: Diagnosis not present

## 2018-09-15 DIAGNOSIS — D509 Iron deficiency anemia, unspecified: Secondary | ICD-10-CM | POA: Diagnosis not present

## 2018-09-15 DIAGNOSIS — D631 Anemia in chronic kidney disease: Secondary | ICD-10-CM | POA: Diagnosis not present

## 2018-09-15 DIAGNOSIS — K769 Liver disease, unspecified: Secondary | ICD-10-CM | POA: Diagnosis not present

## 2018-09-15 DIAGNOSIS — N2581 Secondary hyperparathyroidism of renal origin: Secondary | ICD-10-CM | POA: Diagnosis not present

## 2018-09-15 DIAGNOSIS — E440 Moderate protein-calorie malnutrition: Secondary | ICD-10-CM | POA: Diagnosis not present

## 2018-09-16 DIAGNOSIS — K769 Liver disease, unspecified: Secondary | ICD-10-CM | POA: Diagnosis not present

## 2018-09-16 DIAGNOSIS — E440 Moderate protein-calorie malnutrition: Secondary | ICD-10-CM | POA: Diagnosis not present

## 2018-09-16 DIAGNOSIS — N2581 Secondary hyperparathyroidism of renal origin: Secondary | ICD-10-CM | POA: Diagnosis not present

## 2018-09-16 DIAGNOSIS — N186 End stage renal disease: Secondary | ICD-10-CM | POA: Diagnosis not present

## 2018-09-16 DIAGNOSIS — D509 Iron deficiency anemia, unspecified: Secondary | ICD-10-CM | POA: Diagnosis not present

## 2018-09-16 DIAGNOSIS — D631 Anemia in chronic kidney disease: Secondary | ICD-10-CM | POA: Diagnosis not present

## 2018-09-17 DIAGNOSIS — N2581 Secondary hyperparathyroidism of renal origin: Secondary | ICD-10-CM | POA: Diagnosis not present

## 2018-09-17 DIAGNOSIS — K769 Liver disease, unspecified: Secondary | ICD-10-CM | POA: Diagnosis not present

## 2018-09-17 DIAGNOSIS — N186 End stage renal disease: Secondary | ICD-10-CM | POA: Diagnosis not present

## 2018-09-17 DIAGNOSIS — E440 Moderate protein-calorie malnutrition: Secondary | ICD-10-CM | POA: Diagnosis not present

## 2018-09-17 DIAGNOSIS — D509 Iron deficiency anemia, unspecified: Secondary | ICD-10-CM | POA: Diagnosis not present

## 2018-09-17 DIAGNOSIS — D631 Anemia in chronic kidney disease: Secondary | ICD-10-CM | POA: Diagnosis not present

## 2018-09-18 DIAGNOSIS — E440 Moderate protein-calorie malnutrition: Secondary | ICD-10-CM | POA: Diagnosis not present

## 2018-09-18 DIAGNOSIS — N186 End stage renal disease: Secondary | ICD-10-CM | POA: Diagnosis not present

## 2018-09-18 DIAGNOSIS — N2581 Secondary hyperparathyroidism of renal origin: Secondary | ICD-10-CM | POA: Diagnosis not present

## 2018-09-18 DIAGNOSIS — D631 Anemia in chronic kidney disease: Secondary | ICD-10-CM | POA: Diagnosis not present

## 2018-09-18 DIAGNOSIS — K769 Liver disease, unspecified: Secondary | ICD-10-CM | POA: Diagnosis not present

## 2018-09-18 DIAGNOSIS — D509 Iron deficiency anemia, unspecified: Secondary | ICD-10-CM | POA: Diagnosis not present

## 2018-09-19 DIAGNOSIS — K769 Liver disease, unspecified: Secondary | ICD-10-CM | POA: Diagnosis not present

## 2018-09-19 DIAGNOSIS — D631 Anemia in chronic kidney disease: Secondary | ICD-10-CM | POA: Diagnosis not present

## 2018-09-19 DIAGNOSIS — D509 Iron deficiency anemia, unspecified: Secondary | ICD-10-CM | POA: Diagnosis not present

## 2018-09-19 DIAGNOSIS — E440 Moderate protein-calorie malnutrition: Secondary | ICD-10-CM | POA: Diagnosis not present

## 2018-09-19 DIAGNOSIS — N186 End stage renal disease: Secondary | ICD-10-CM | POA: Diagnosis not present

## 2018-09-19 DIAGNOSIS — N2581 Secondary hyperparathyroidism of renal origin: Secondary | ICD-10-CM | POA: Diagnosis not present

## 2018-09-20 DIAGNOSIS — N186 End stage renal disease: Secondary | ICD-10-CM | POA: Diagnosis not present

## 2018-09-20 DIAGNOSIS — K769 Liver disease, unspecified: Secondary | ICD-10-CM | POA: Diagnosis not present

## 2018-09-20 DIAGNOSIS — E440 Moderate protein-calorie malnutrition: Secondary | ICD-10-CM | POA: Diagnosis not present

## 2018-09-20 DIAGNOSIS — D631 Anemia in chronic kidney disease: Secondary | ICD-10-CM | POA: Diagnosis not present

## 2018-09-20 DIAGNOSIS — D509 Iron deficiency anemia, unspecified: Secondary | ICD-10-CM | POA: Diagnosis not present

## 2018-09-20 DIAGNOSIS — N2581 Secondary hyperparathyroidism of renal origin: Secondary | ICD-10-CM | POA: Diagnosis not present

## 2018-09-21 DIAGNOSIS — D509 Iron deficiency anemia, unspecified: Secondary | ICD-10-CM | POA: Diagnosis not present

## 2018-09-21 DIAGNOSIS — N186 End stage renal disease: Secondary | ICD-10-CM | POA: Diagnosis not present

## 2018-09-21 DIAGNOSIS — E440 Moderate protein-calorie malnutrition: Secondary | ICD-10-CM | POA: Diagnosis not present

## 2018-09-21 DIAGNOSIS — N2581 Secondary hyperparathyroidism of renal origin: Secondary | ICD-10-CM | POA: Diagnosis not present

## 2018-09-21 DIAGNOSIS — K769 Liver disease, unspecified: Secondary | ICD-10-CM | POA: Diagnosis not present

## 2018-09-21 DIAGNOSIS — D631 Anemia in chronic kidney disease: Secondary | ICD-10-CM | POA: Diagnosis not present

## 2018-09-22 DIAGNOSIS — N186 End stage renal disease: Secondary | ICD-10-CM | POA: Diagnosis not present

## 2018-09-22 DIAGNOSIS — N2581 Secondary hyperparathyroidism of renal origin: Secondary | ICD-10-CM | POA: Diagnosis not present

## 2018-09-22 DIAGNOSIS — D509 Iron deficiency anemia, unspecified: Secondary | ICD-10-CM | POA: Diagnosis not present

## 2018-09-22 DIAGNOSIS — D631 Anemia in chronic kidney disease: Secondary | ICD-10-CM | POA: Diagnosis not present

## 2018-09-22 DIAGNOSIS — E440 Moderate protein-calorie malnutrition: Secondary | ICD-10-CM | POA: Diagnosis not present

## 2018-09-22 DIAGNOSIS — K769 Liver disease, unspecified: Secondary | ICD-10-CM | POA: Diagnosis not present

## 2018-09-23 DIAGNOSIS — D509 Iron deficiency anemia, unspecified: Secondary | ICD-10-CM | POA: Diagnosis not present

## 2018-09-23 DIAGNOSIS — N2581 Secondary hyperparathyroidism of renal origin: Secondary | ICD-10-CM | POA: Diagnosis not present

## 2018-09-23 DIAGNOSIS — E440 Moderate protein-calorie malnutrition: Secondary | ICD-10-CM | POA: Diagnosis not present

## 2018-09-23 DIAGNOSIS — D631 Anemia in chronic kidney disease: Secondary | ICD-10-CM | POA: Diagnosis not present

## 2018-09-23 DIAGNOSIS — K769 Liver disease, unspecified: Secondary | ICD-10-CM | POA: Diagnosis not present

## 2018-09-23 DIAGNOSIS — N186 End stage renal disease: Secondary | ICD-10-CM | POA: Diagnosis not present

## 2018-09-24 DIAGNOSIS — E440 Moderate protein-calorie malnutrition: Secondary | ICD-10-CM | POA: Diagnosis not present

## 2018-09-24 DIAGNOSIS — D631 Anemia in chronic kidney disease: Secondary | ICD-10-CM | POA: Diagnosis not present

## 2018-09-24 DIAGNOSIS — N2581 Secondary hyperparathyroidism of renal origin: Secondary | ICD-10-CM | POA: Diagnosis not present

## 2018-09-24 DIAGNOSIS — N186 End stage renal disease: Secondary | ICD-10-CM | POA: Diagnosis not present

## 2018-09-24 DIAGNOSIS — K769 Liver disease, unspecified: Secondary | ICD-10-CM | POA: Diagnosis not present

## 2018-09-24 DIAGNOSIS — D509 Iron deficiency anemia, unspecified: Secondary | ICD-10-CM | POA: Diagnosis not present

## 2018-09-25 DIAGNOSIS — N186 End stage renal disease: Secondary | ICD-10-CM | POA: Diagnosis not present

## 2018-09-25 DIAGNOSIS — K769 Liver disease, unspecified: Secondary | ICD-10-CM | POA: Diagnosis not present

## 2018-09-25 DIAGNOSIS — N2581 Secondary hyperparathyroidism of renal origin: Secondary | ICD-10-CM | POA: Diagnosis not present

## 2018-09-25 DIAGNOSIS — E440 Moderate protein-calorie malnutrition: Secondary | ICD-10-CM | POA: Diagnosis not present

## 2018-09-25 DIAGNOSIS — D631 Anemia in chronic kidney disease: Secondary | ICD-10-CM | POA: Diagnosis not present

## 2018-09-25 DIAGNOSIS — D509 Iron deficiency anemia, unspecified: Secondary | ICD-10-CM | POA: Diagnosis not present

## 2018-09-26 DIAGNOSIS — N186 End stage renal disease: Secondary | ICD-10-CM | POA: Diagnosis not present

## 2018-09-26 DIAGNOSIS — E440 Moderate protein-calorie malnutrition: Secondary | ICD-10-CM | POA: Diagnosis not present

## 2018-09-26 DIAGNOSIS — N2581 Secondary hyperparathyroidism of renal origin: Secondary | ICD-10-CM | POA: Diagnosis not present

## 2018-09-26 DIAGNOSIS — K769 Liver disease, unspecified: Secondary | ICD-10-CM | POA: Diagnosis not present

## 2018-09-26 DIAGNOSIS — D631 Anemia in chronic kidney disease: Secondary | ICD-10-CM | POA: Diagnosis not present

## 2018-09-26 DIAGNOSIS — D509 Iron deficiency anemia, unspecified: Secondary | ICD-10-CM | POA: Diagnosis not present

## 2018-09-27 DIAGNOSIS — N2581 Secondary hyperparathyroidism of renal origin: Secondary | ICD-10-CM | POA: Diagnosis not present

## 2018-09-27 DIAGNOSIS — D509 Iron deficiency anemia, unspecified: Secondary | ICD-10-CM | POA: Diagnosis not present

## 2018-09-27 DIAGNOSIS — N186 End stage renal disease: Secondary | ICD-10-CM | POA: Diagnosis not present

## 2018-09-27 DIAGNOSIS — K769 Liver disease, unspecified: Secondary | ICD-10-CM | POA: Diagnosis not present

## 2018-09-27 DIAGNOSIS — E440 Moderate protein-calorie malnutrition: Secondary | ICD-10-CM | POA: Diagnosis not present

## 2018-09-27 DIAGNOSIS — D631 Anemia in chronic kidney disease: Secondary | ICD-10-CM | POA: Diagnosis not present

## 2018-09-28 DIAGNOSIS — D631 Anemia in chronic kidney disease: Secondary | ICD-10-CM | POA: Diagnosis not present

## 2018-09-28 DIAGNOSIS — N2581 Secondary hyperparathyroidism of renal origin: Secondary | ICD-10-CM | POA: Diagnosis not present

## 2018-09-28 DIAGNOSIS — K769 Liver disease, unspecified: Secondary | ICD-10-CM | POA: Diagnosis not present

## 2018-09-28 DIAGNOSIS — E440 Moderate protein-calorie malnutrition: Secondary | ICD-10-CM | POA: Diagnosis not present

## 2018-09-28 DIAGNOSIS — N186 End stage renal disease: Secondary | ICD-10-CM | POA: Diagnosis not present

## 2018-09-28 DIAGNOSIS — D509 Iron deficiency anemia, unspecified: Secondary | ICD-10-CM | POA: Diagnosis not present

## 2018-09-29 DIAGNOSIS — D509 Iron deficiency anemia, unspecified: Secondary | ICD-10-CM | POA: Diagnosis not present

## 2018-09-29 DIAGNOSIS — K769 Liver disease, unspecified: Secondary | ICD-10-CM | POA: Diagnosis not present

## 2018-09-29 DIAGNOSIS — N186 End stage renal disease: Secondary | ICD-10-CM | POA: Diagnosis not present

## 2018-09-29 DIAGNOSIS — D631 Anemia in chronic kidney disease: Secondary | ICD-10-CM | POA: Diagnosis not present

## 2018-09-29 DIAGNOSIS — N2581 Secondary hyperparathyroidism of renal origin: Secondary | ICD-10-CM | POA: Diagnosis not present

## 2018-09-29 DIAGNOSIS — E440 Moderate protein-calorie malnutrition: Secondary | ICD-10-CM | POA: Diagnosis not present

## 2018-09-30 DIAGNOSIS — D509 Iron deficiency anemia, unspecified: Secondary | ICD-10-CM | POA: Diagnosis not present

## 2018-09-30 DIAGNOSIS — N2581 Secondary hyperparathyroidism of renal origin: Secondary | ICD-10-CM | POA: Diagnosis not present

## 2018-09-30 DIAGNOSIS — E440 Moderate protein-calorie malnutrition: Secondary | ICD-10-CM | POA: Diagnosis not present

## 2018-09-30 DIAGNOSIS — N186 End stage renal disease: Secondary | ICD-10-CM | POA: Diagnosis not present

## 2018-09-30 DIAGNOSIS — K769 Liver disease, unspecified: Secondary | ICD-10-CM | POA: Diagnosis not present

## 2018-09-30 DIAGNOSIS — D631 Anemia in chronic kidney disease: Secondary | ICD-10-CM | POA: Diagnosis not present

## 2018-10-01 DIAGNOSIS — D631 Anemia in chronic kidney disease: Secondary | ICD-10-CM | POA: Diagnosis not present

## 2018-10-01 DIAGNOSIS — N2581 Secondary hyperparathyroidism of renal origin: Secondary | ICD-10-CM | POA: Diagnosis not present

## 2018-10-01 DIAGNOSIS — N186 End stage renal disease: Secondary | ICD-10-CM | POA: Diagnosis not present

## 2018-10-01 DIAGNOSIS — E440 Moderate protein-calorie malnutrition: Secondary | ICD-10-CM | POA: Diagnosis not present

## 2018-10-01 DIAGNOSIS — D509 Iron deficiency anemia, unspecified: Secondary | ICD-10-CM | POA: Diagnosis not present

## 2018-10-01 DIAGNOSIS — K769 Liver disease, unspecified: Secondary | ICD-10-CM | POA: Diagnosis not present

## 2018-10-02 DIAGNOSIS — K769 Liver disease, unspecified: Secondary | ICD-10-CM | POA: Diagnosis not present

## 2018-10-02 DIAGNOSIS — N2581 Secondary hyperparathyroidism of renal origin: Secondary | ICD-10-CM | POA: Diagnosis not present

## 2018-10-02 DIAGNOSIS — N186 End stage renal disease: Secondary | ICD-10-CM | POA: Diagnosis not present

## 2018-10-02 DIAGNOSIS — D509 Iron deficiency anemia, unspecified: Secondary | ICD-10-CM | POA: Diagnosis not present

## 2018-10-02 DIAGNOSIS — E440 Moderate protein-calorie malnutrition: Secondary | ICD-10-CM | POA: Diagnosis not present

## 2018-10-02 DIAGNOSIS — D631 Anemia in chronic kidney disease: Secondary | ICD-10-CM | POA: Diagnosis not present

## 2018-10-03 DIAGNOSIS — N2581 Secondary hyperparathyroidism of renal origin: Secondary | ICD-10-CM | POA: Diagnosis not present

## 2018-10-03 DIAGNOSIS — D509 Iron deficiency anemia, unspecified: Secondary | ICD-10-CM | POA: Diagnosis not present

## 2018-10-03 DIAGNOSIS — E440 Moderate protein-calorie malnutrition: Secondary | ICD-10-CM | POA: Diagnosis not present

## 2018-10-03 DIAGNOSIS — N186 End stage renal disease: Secondary | ICD-10-CM | POA: Diagnosis not present

## 2018-10-03 DIAGNOSIS — D631 Anemia in chronic kidney disease: Secondary | ICD-10-CM | POA: Diagnosis not present

## 2018-10-03 DIAGNOSIS — K769 Liver disease, unspecified: Secondary | ICD-10-CM | POA: Diagnosis not present

## 2018-10-04 DIAGNOSIS — N2581 Secondary hyperparathyroidism of renal origin: Secondary | ICD-10-CM | POA: Diagnosis not present

## 2018-10-04 DIAGNOSIS — D631 Anemia in chronic kidney disease: Secondary | ICD-10-CM | POA: Diagnosis not present

## 2018-10-04 DIAGNOSIS — N186 End stage renal disease: Secondary | ICD-10-CM | POA: Diagnosis not present

## 2018-10-04 DIAGNOSIS — E440 Moderate protein-calorie malnutrition: Secondary | ICD-10-CM | POA: Diagnosis not present

## 2018-10-04 DIAGNOSIS — D509 Iron deficiency anemia, unspecified: Secondary | ICD-10-CM | POA: Diagnosis not present

## 2018-10-04 DIAGNOSIS — K769 Liver disease, unspecified: Secondary | ICD-10-CM | POA: Diagnosis not present

## 2018-10-05 DIAGNOSIS — E440 Moderate protein-calorie malnutrition: Secondary | ICD-10-CM | POA: Diagnosis not present

## 2018-10-05 DIAGNOSIS — D631 Anemia in chronic kidney disease: Secondary | ICD-10-CM | POA: Diagnosis not present

## 2018-10-05 DIAGNOSIS — D509 Iron deficiency anemia, unspecified: Secondary | ICD-10-CM | POA: Diagnosis not present

## 2018-10-05 DIAGNOSIS — N2581 Secondary hyperparathyroidism of renal origin: Secondary | ICD-10-CM | POA: Diagnosis not present

## 2018-10-05 DIAGNOSIS — N186 End stage renal disease: Secondary | ICD-10-CM | POA: Diagnosis not present

## 2018-10-05 DIAGNOSIS — K769 Liver disease, unspecified: Secondary | ICD-10-CM | POA: Diagnosis not present

## 2018-10-06 DIAGNOSIS — D509 Iron deficiency anemia, unspecified: Secondary | ICD-10-CM | POA: Diagnosis not present

## 2018-10-06 DIAGNOSIS — N2581 Secondary hyperparathyroidism of renal origin: Secondary | ICD-10-CM | POA: Diagnosis not present

## 2018-10-06 DIAGNOSIS — N2589 Other disorders resulting from impaired renal tubular function: Secondary | ICD-10-CM | POA: Diagnosis not present

## 2018-10-06 DIAGNOSIS — E7841 Elevated Lipoprotein(a): Secondary | ICD-10-CM | POA: Diagnosis not present

## 2018-10-06 DIAGNOSIS — E440 Moderate protein-calorie malnutrition: Secondary | ICD-10-CM | POA: Diagnosis not present

## 2018-10-06 DIAGNOSIS — Z992 Dependence on renal dialysis: Secondary | ICD-10-CM | POA: Diagnosis not present

## 2018-10-06 DIAGNOSIS — N186 End stage renal disease: Secondary | ICD-10-CM | POA: Diagnosis not present

## 2018-10-06 DIAGNOSIS — D631 Anemia in chronic kidney disease: Secondary | ICD-10-CM | POA: Diagnosis not present

## 2018-10-06 DIAGNOSIS — Z79899 Other long term (current) drug therapy: Secondary | ICD-10-CM | POA: Diagnosis not present

## 2018-10-06 DIAGNOSIS — I129 Hypertensive chronic kidney disease with stage 1 through stage 4 chronic kidney disease, or unspecified chronic kidney disease: Secondary | ICD-10-CM | POA: Diagnosis not present

## 2018-10-07 DIAGNOSIS — D509 Iron deficiency anemia, unspecified: Secondary | ICD-10-CM | POA: Diagnosis not present

## 2018-10-07 DIAGNOSIS — N2581 Secondary hyperparathyroidism of renal origin: Secondary | ICD-10-CM | POA: Diagnosis not present

## 2018-10-07 DIAGNOSIS — Z79899 Other long term (current) drug therapy: Secondary | ICD-10-CM | POA: Diagnosis not present

## 2018-10-07 DIAGNOSIS — N186 End stage renal disease: Secondary | ICD-10-CM | POA: Diagnosis not present

## 2018-10-07 DIAGNOSIS — E7841 Elevated Lipoprotein(a): Secondary | ICD-10-CM | POA: Diagnosis not present

## 2018-10-07 DIAGNOSIS — D631 Anemia in chronic kidney disease: Secondary | ICD-10-CM | POA: Diagnosis not present

## 2018-10-08 DIAGNOSIS — D631 Anemia in chronic kidney disease: Secondary | ICD-10-CM | POA: Diagnosis not present

## 2018-10-08 DIAGNOSIS — Z79899 Other long term (current) drug therapy: Secondary | ICD-10-CM | POA: Diagnosis not present

## 2018-10-08 DIAGNOSIS — E7841 Elevated Lipoprotein(a): Secondary | ICD-10-CM | POA: Diagnosis not present

## 2018-10-08 DIAGNOSIS — D509 Iron deficiency anemia, unspecified: Secondary | ICD-10-CM | POA: Diagnosis not present

## 2018-10-08 DIAGNOSIS — N186 End stage renal disease: Secondary | ICD-10-CM | POA: Diagnosis not present

## 2018-10-08 DIAGNOSIS — N2581 Secondary hyperparathyroidism of renal origin: Secondary | ICD-10-CM | POA: Diagnosis not present

## 2018-10-09 DIAGNOSIS — N186 End stage renal disease: Secondary | ICD-10-CM | POA: Diagnosis not present

## 2018-10-09 DIAGNOSIS — D509 Iron deficiency anemia, unspecified: Secondary | ICD-10-CM | POA: Diagnosis not present

## 2018-10-09 DIAGNOSIS — Z79899 Other long term (current) drug therapy: Secondary | ICD-10-CM | POA: Diagnosis not present

## 2018-10-09 DIAGNOSIS — N2581 Secondary hyperparathyroidism of renal origin: Secondary | ICD-10-CM | POA: Diagnosis not present

## 2018-10-09 DIAGNOSIS — E7841 Elevated Lipoprotein(a): Secondary | ICD-10-CM | POA: Diagnosis not present

## 2018-10-09 DIAGNOSIS — D631 Anemia in chronic kidney disease: Secondary | ICD-10-CM | POA: Diagnosis not present

## 2018-10-10 DIAGNOSIS — N186 End stage renal disease: Secondary | ICD-10-CM | POA: Diagnosis not present

## 2018-10-10 DIAGNOSIS — E7841 Elevated Lipoprotein(a): Secondary | ICD-10-CM | POA: Diagnosis not present

## 2018-10-10 DIAGNOSIS — Z79899 Other long term (current) drug therapy: Secondary | ICD-10-CM | POA: Diagnosis not present

## 2018-10-10 DIAGNOSIS — D509 Iron deficiency anemia, unspecified: Secondary | ICD-10-CM | POA: Diagnosis not present

## 2018-10-10 DIAGNOSIS — N2581 Secondary hyperparathyroidism of renal origin: Secondary | ICD-10-CM | POA: Diagnosis not present

## 2018-10-10 DIAGNOSIS — D631 Anemia in chronic kidney disease: Secondary | ICD-10-CM | POA: Diagnosis not present

## 2018-10-11 DIAGNOSIS — E7841 Elevated Lipoprotein(a): Secondary | ICD-10-CM | POA: Diagnosis not present

## 2018-10-11 DIAGNOSIS — Z79899 Other long term (current) drug therapy: Secondary | ICD-10-CM | POA: Diagnosis not present

## 2018-10-11 DIAGNOSIS — N186 End stage renal disease: Secondary | ICD-10-CM | POA: Diagnosis not present

## 2018-10-11 DIAGNOSIS — D509 Iron deficiency anemia, unspecified: Secondary | ICD-10-CM | POA: Diagnosis not present

## 2018-10-11 DIAGNOSIS — D631 Anemia in chronic kidney disease: Secondary | ICD-10-CM | POA: Diagnosis not present

## 2018-10-11 DIAGNOSIS — N2581 Secondary hyperparathyroidism of renal origin: Secondary | ICD-10-CM | POA: Diagnosis not present

## 2018-10-12 DIAGNOSIS — N186 End stage renal disease: Secondary | ICD-10-CM | POA: Diagnosis not present

## 2018-10-12 DIAGNOSIS — Z79899 Other long term (current) drug therapy: Secondary | ICD-10-CM | POA: Diagnosis not present

## 2018-10-12 DIAGNOSIS — N2581 Secondary hyperparathyroidism of renal origin: Secondary | ICD-10-CM | POA: Diagnosis not present

## 2018-10-12 DIAGNOSIS — E7841 Elevated Lipoprotein(a): Secondary | ICD-10-CM | POA: Diagnosis not present

## 2018-10-12 DIAGNOSIS — D631 Anemia in chronic kidney disease: Secondary | ICD-10-CM | POA: Diagnosis not present

## 2018-10-12 DIAGNOSIS — D509 Iron deficiency anemia, unspecified: Secondary | ICD-10-CM | POA: Diagnosis not present

## 2018-10-13 DIAGNOSIS — D509 Iron deficiency anemia, unspecified: Secondary | ICD-10-CM | POA: Diagnosis not present

## 2018-10-13 DIAGNOSIS — N2581 Secondary hyperparathyroidism of renal origin: Secondary | ICD-10-CM | POA: Diagnosis not present

## 2018-10-13 DIAGNOSIS — E7841 Elevated Lipoprotein(a): Secondary | ICD-10-CM | POA: Diagnosis not present

## 2018-10-13 DIAGNOSIS — N186 End stage renal disease: Secondary | ICD-10-CM | POA: Diagnosis not present

## 2018-10-13 DIAGNOSIS — N2589 Other disorders resulting from impaired renal tubular function: Secondary | ICD-10-CM | POA: Diagnosis not present

## 2018-10-13 DIAGNOSIS — D631 Anemia in chronic kidney disease: Secondary | ICD-10-CM | POA: Diagnosis not present

## 2018-10-13 DIAGNOSIS — Z79899 Other long term (current) drug therapy: Secondary | ICD-10-CM | POA: Diagnosis not present

## 2018-10-14 DIAGNOSIS — D631 Anemia in chronic kidney disease: Secondary | ICD-10-CM | POA: Diagnosis not present

## 2018-10-14 DIAGNOSIS — D509 Iron deficiency anemia, unspecified: Secondary | ICD-10-CM | POA: Diagnosis not present

## 2018-10-14 DIAGNOSIS — N2581 Secondary hyperparathyroidism of renal origin: Secondary | ICD-10-CM | POA: Diagnosis not present

## 2018-10-14 DIAGNOSIS — N186 End stage renal disease: Secondary | ICD-10-CM | POA: Diagnosis not present

## 2018-10-14 DIAGNOSIS — E7841 Elevated Lipoprotein(a): Secondary | ICD-10-CM | POA: Diagnosis not present

## 2018-10-14 DIAGNOSIS — Z79899 Other long term (current) drug therapy: Secondary | ICD-10-CM | POA: Diagnosis not present

## 2018-10-15 DIAGNOSIS — D631 Anemia in chronic kidney disease: Secondary | ICD-10-CM | POA: Diagnosis not present

## 2018-10-15 DIAGNOSIS — N186 End stage renal disease: Secondary | ICD-10-CM | POA: Diagnosis not present

## 2018-10-15 DIAGNOSIS — D509 Iron deficiency anemia, unspecified: Secondary | ICD-10-CM | POA: Diagnosis not present

## 2018-10-15 DIAGNOSIS — E7841 Elevated Lipoprotein(a): Secondary | ICD-10-CM | POA: Diagnosis not present

## 2018-10-15 DIAGNOSIS — N2581 Secondary hyperparathyroidism of renal origin: Secondary | ICD-10-CM | POA: Diagnosis not present

## 2018-10-15 DIAGNOSIS — Z79899 Other long term (current) drug therapy: Secondary | ICD-10-CM | POA: Diagnosis not present

## 2018-10-16 DIAGNOSIS — Z79899 Other long term (current) drug therapy: Secondary | ICD-10-CM | POA: Diagnosis not present

## 2018-10-16 DIAGNOSIS — N186 End stage renal disease: Secondary | ICD-10-CM | POA: Diagnosis not present

## 2018-10-16 DIAGNOSIS — E7841 Elevated Lipoprotein(a): Secondary | ICD-10-CM | POA: Diagnosis not present

## 2018-10-16 DIAGNOSIS — D509 Iron deficiency anemia, unspecified: Secondary | ICD-10-CM | POA: Diagnosis not present

## 2018-10-16 DIAGNOSIS — N2581 Secondary hyperparathyroidism of renal origin: Secondary | ICD-10-CM | POA: Diagnosis not present

## 2018-10-16 DIAGNOSIS — D631 Anemia in chronic kidney disease: Secondary | ICD-10-CM | POA: Diagnosis not present

## 2018-10-17 DIAGNOSIS — N186 End stage renal disease: Secondary | ICD-10-CM | POA: Diagnosis not present

## 2018-10-17 DIAGNOSIS — N2581 Secondary hyperparathyroidism of renal origin: Secondary | ICD-10-CM | POA: Diagnosis not present

## 2018-10-17 DIAGNOSIS — D509 Iron deficiency anemia, unspecified: Secondary | ICD-10-CM | POA: Diagnosis not present

## 2018-10-17 DIAGNOSIS — E7841 Elevated Lipoprotein(a): Secondary | ICD-10-CM | POA: Diagnosis not present

## 2018-10-17 DIAGNOSIS — Z79899 Other long term (current) drug therapy: Secondary | ICD-10-CM | POA: Diagnosis not present

## 2018-10-17 DIAGNOSIS — D631 Anemia in chronic kidney disease: Secondary | ICD-10-CM | POA: Diagnosis not present

## 2018-10-18 DIAGNOSIS — E7841 Elevated Lipoprotein(a): Secondary | ICD-10-CM | POA: Diagnosis not present

## 2018-10-18 DIAGNOSIS — N2581 Secondary hyperparathyroidism of renal origin: Secondary | ICD-10-CM | POA: Diagnosis not present

## 2018-10-18 DIAGNOSIS — D631 Anemia in chronic kidney disease: Secondary | ICD-10-CM | POA: Diagnosis not present

## 2018-10-18 DIAGNOSIS — D509 Iron deficiency anemia, unspecified: Secondary | ICD-10-CM | POA: Diagnosis not present

## 2018-10-18 DIAGNOSIS — N186 End stage renal disease: Secondary | ICD-10-CM | POA: Diagnosis not present

## 2018-10-18 DIAGNOSIS — Z79899 Other long term (current) drug therapy: Secondary | ICD-10-CM | POA: Diagnosis not present

## 2018-10-19 DIAGNOSIS — N186 End stage renal disease: Secondary | ICD-10-CM | POA: Diagnosis not present

## 2018-10-19 DIAGNOSIS — D509 Iron deficiency anemia, unspecified: Secondary | ICD-10-CM | POA: Diagnosis not present

## 2018-10-19 DIAGNOSIS — D631 Anemia in chronic kidney disease: Secondary | ICD-10-CM | POA: Diagnosis not present

## 2018-10-19 DIAGNOSIS — Z79899 Other long term (current) drug therapy: Secondary | ICD-10-CM | POA: Diagnosis not present

## 2018-10-19 DIAGNOSIS — N2581 Secondary hyperparathyroidism of renal origin: Secondary | ICD-10-CM | POA: Diagnosis not present

## 2018-10-19 DIAGNOSIS — E7841 Elevated Lipoprotein(a): Secondary | ICD-10-CM | POA: Diagnosis not present

## 2018-10-20 DIAGNOSIS — N186 End stage renal disease: Secondary | ICD-10-CM | POA: Diagnosis not present

## 2018-10-20 DIAGNOSIS — N2581 Secondary hyperparathyroidism of renal origin: Secondary | ICD-10-CM | POA: Diagnosis not present

## 2018-10-20 DIAGNOSIS — D509 Iron deficiency anemia, unspecified: Secondary | ICD-10-CM | POA: Diagnosis not present

## 2018-10-20 DIAGNOSIS — E7841 Elevated Lipoprotein(a): Secondary | ICD-10-CM | POA: Diagnosis not present

## 2018-10-20 DIAGNOSIS — Z79899 Other long term (current) drug therapy: Secondary | ICD-10-CM | POA: Diagnosis not present

## 2018-10-20 DIAGNOSIS — D631 Anemia in chronic kidney disease: Secondary | ICD-10-CM | POA: Diagnosis not present

## 2018-10-21 DIAGNOSIS — D509 Iron deficiency anemia, unspecified: Secondary | ICD-10-CM | POA: Diagnosis not present

## 2018-10-21 DIAGNOSIS — Z79899 Other long term (current) drug therapy: Secondary | ICD-10-CM | POA: Diagnosis not present

## 2018-10-21 DIAGNOSIS — E7841 Elevated Lipoprotein(a): Secondary | ICD-10-CM | POA: Diagnosis not present

## 2018-10-21 DIAGNOSIS — N186 End stage renal disease: Secondary | ICD-10-CM | POA: Diagnosis not present

## 2018-10-21 DIAGNOSIS — N2581 Secondary hyperparathyroidism of renal origin: Secondary | ICD-10-CM | POA: Diagnosis not present

## 2018-10-21 DIAGNOSIS — D631 Anemia in chronic kidney disease: Secondary | ICD-10-CM | POA: Diagnosis not present

## 2018-10-22 DIAGNOSIS — E7841 Elevated Lipoprotein(a): Secondary | ICD-10-CM | POA: Diagnosis not present

## 2018-10-22 DIAGNOSIS — Z79899 Other long term (current) drug therapy: Secondary | ICD-10-CM | POA: Diagnosis not present

## 2018-10-22 DIAGNOSIS — N186 End stage renal disease: Secondary | ICD-10-CM | POA: Diagnosis not present

## 2018-10-22 DIAGNOSIS — D631 Anemia in chronic kidney disease: Secondary | ICD-10-CM | POA: Diagnosis not present

## 2018-10-22 DIAGNOSIS — N2581 Secondary hyperparathyroidism of renal origin: Secondary | ICD-10-CM | POA: Diagnosis not present

## 2018-10-22 DIAGNOSIS — D509 Iron deficiency anemia, unspecified: Secondary | ICD-10-CM | POA: Diagnosis not present

## 2018-10-23 DIAGNOSIS — N186 End stage renal disease: Secondary | ICD-10-CM | POA: Diagnosis not present

## 2018-10-23 DIAGNOSIS — D631 Anemia in chronic kidney disease: Secondary | ICD-10-CM | POA: Diagnosis not present

## 2018-10-23 DIAGNOSIS — N2581 Secondary hyperparathyroidism of renal origin: Secondary | ICD-10-CM | POA: Diagnosis not present

## 2018-10-23 DIAGNOSIS — D509 Iron deficiency anemia, unspecified: Secondary | ICD-10-CM | POA: Diagnosis not present

## 2018-10-23 DIAGNOSIS — E7841 Elevated Lipoprotein(a): Secondary | ICD-10-CM | POA: Diagnosis not present

## 2018-10-23 DIAGNOSIS — Z79899 Other long term (current) drug therapy: Secondary | ICD-10-CM | POA: Diagnosis not present

## 2018-10-24 DIAGNOSIS — Z79899 Other long term (current) drug therapy: Secondary | ICD-10-CM | POA: Diagnosis not present

## 2018-10-24 DIAGNOSIS — N186 End stage renal disease: Secondary | ICD-10-CM | POA: Diagnosis not present

## 2018-10-24 DIAGNOSIS — E7841 Elevated Lipoprotein(a): Secondary | ICD-10-CM | POA: Diagnosis not present

## 2018-10-24 DIAGNOSIS — D509 Iron deficiency anemia, unspecified: Secondary | ICD-10-CM | POA: Diagnosis not present

## 2018-10-24 DIAGNOSIS — N2581 Secondary hyperparathyroidism of renal origin: Secondary | ICD-10-CM | POA: Diagnosis not present

## 2018-10-24 DIAGNOSIS — D631 Anemia in chronic kidney disease: Secondary | ICD-10-CM | POA: Diagnosis not present

## 2018-10-25 DIAGNOSIS — N2581 Secondary hyperparathyroidism of renal origin: Secondary | ICD-10-CM | POA: Diagnosis not present

## 2018-10-25 DIAGNOSIS — N186 End stage renal disease: Secondary | ICD-10-CM | POA: Diagnosis not present

## 2018-10-25 DIAGNOSIS — D631 Anemia in chronic kidney disease: Secondary | ICD-10-CM | POA: Diagnosis not present

## 2018-10-25 DIAGNOSIS — D509 Iron deficiency anemia, unspecified: Secondary | ICD-10-CM | POA: Diagnosis not present

## 2018-10-25 DIAGNOSIS — Z79899 Other long term (current) drug therapy: Secondary | ICD-10-CM | POA: Diagnosis not present

## 2018-10-25 DIAGNOSIS — E7841 Elevated Lipoprotein(a): Secondary | ICD-10-CM | POA: Diagnosis not present

## 2018-10-26 DIAGNOSIS — N2581 Secondary hyperparathyroidism of renal origin: Secondary | ICD-10-CM | POA: Diagnosis not present

## 2018-10-26 DIAGNOSIS — Z79899 Other long term (current) drug therapy: Secondary | ICD-10-CM | POA: Diagnosis not present

## 2018-10-26 DIAGNOSIS — E7841 Elevated Lipoprotein(a): Secondary | ICD-10-CM | POA: Diagnosis not present

## 2018-10-26 DIAGNOSIS — N186 End stage renal disease: Secondary | ICD-10-CM | POA: Diagnosis not present

## 2018-10-26 DIAGNOSIS — D509 Iron deficiency anemia, unspecified: Secondary | ICD-10-CM | POA: Diagnosis not present

## 2018-10-26 DIAGNOSIS — D631 Anemia in chronic kidney disease: Secondary | ICD-10-CM | POA: Diagnosis not present

## 2018-10-27 DIAGNOSIS — Z79899 Other long term (current) drug therapy: Secondary | ICD-10-CM | POA: Diagnosis not present

## 2018-10-27 DIAGNOSIS — N2581 Secondary hyperparathyroidism of renal origin: Secondary | ICD-10-CM | POA: Diagnosis not present

## 2018-10-27 DIAGNOSIS — N186 End stage renal disease: Secondary | ICD-10-CM | POA: Diagnosis not present

## 2018-10-27 DIAGNOSIS — D509 Iron deficiency anemia, unspecified: Secondary | ICD-10-CM | POA: Diagnosis not present

## 2018-10-27 DIAGNOSIS — E7841 Elevated Lipoprotein(a): Secondary | ICD-10-CM | POA: Diagnosis not present

## 2018-10-27 DIAGNOSIS — D631 Anemia in chronic kidney disease: Secondary | ICD-10-CM | POA: Diagnosis not present

## 2018-10-28 DIAGNOSIS — D509 Iron deficiency anemia, unspecified: Secondary | ICD-10-CM | POA: Diagnosis not present

## 2018-10-28 DIAGNOSIS — N2581 Secondary hyperparathyroidism of renal origin: Secondary | ICD-10-CM | POA: Diagnosis not present

## 2018-10-28 DIAGNOSIS — Z79899 Other long term (current) drug therapy: Secondary | ICD-10-CM | POA: Diagnosis not present

## 2018-10-28 DIAGNOSIS — D631 Anemia in chronic kidney disease: Secondary | ICD-10-CM | POA: Diagnosis not present

## 2018-10-28 DIAGNOSIS — N186 End stage renal disease: Secondary | ICD-10-CM | POA: Diagnosis not present

## 2018-10-28 DIAGNOSIS — E7841 Elevated Lipoprotein(a): Secondary | ICD-10-CM | POA: Diagnosis not present

## 2018-10-29 DIAGNOSIS — D509 Iron deficiency anemia, unspecified: Secondary | ICD-10-CM | POA: Diagnosis not present

## 2018-10-29 DIAGNOSIS — E7841 Elevated Lipoprotein(a): Secondary | ICD-10-CM | POA: Diagnosis not present

## 2018-10-29 DIAGNOSIS — N186 End stage renal disease: Secondary | ICD-10-CM | POA: Diagnosis not present

## 2018-10-29 DIAGNOSIS — Z79899 Other long term (current) drug therapy: Secondary | ICD-10-CM | POA: Diagnosis not present

## 2018-10-29 DIAGNOSIS — N2581 Secondary hyperparathyroidism of renal origin: Secondary | ICD-10-CM | POA: Diagnosis not present

## 2018-10-29 DIAGNOSIS — D631 Anemia in chronic kidney disease: Secondary | ICD-10-CM | POA: Diagnosis not present

## 2018-10-30 DIAGNOSIS — Z79899 Other long term (current) drug therapy: Secondary | ICD-10-CM | POA: Diagnosis not present

## 2018-10-30 DIAGNOSIS — D631 Anemia in chronic kidney disease: Secondary | ICD-10-CM | POA: Diagnosis not present

## 2018-10-30 DIAGNOSIS — E7841 Elevated Lipoprotein(a): Secondary | ICD-10-CM | POA: Diagnosis not present

## 2018-10-30 DIAGNOSIS — N186 End stage renal disease: Secondary | ICD-10-CM | POA: Diagnosis not present

## 2018-10-30 DIAGNOSIS — N2581 Secondary hyperparathyroidism of renal origin: Secondary | ICD-10-CM | POA: Diagnosis not present

## 2018-10-30 DIAGNOSIS — D509 Iron deficiency anemia, unspecified: Secondary | ICD-10-CM | POA: Diagnosis not present

## 2018-10-31 DIAGNOSIS — D631 Anemia in chronic kidney disease: Secondary | ICD-10-CM | POA: Diagnosis not present

## 2018-10-31 DIAGNOSIS — Z79899 Other long term (current) drug therapy: Secondary | ICD-10-CM | POA: Diagnosis not present

## 2018-10-31 DIAGNOSIS — D509 Iron deficiency anemia, unspecified: Secondary | ICD-10-CM | POA: Diagnosis not present

## 2018-10-31 DIAGNOSIS — N2581 Secondary hyperparathyroidism of renal origin: Secondary | ICD-10-CM | POA: Diagnosis not present

## 2018-10-31 DIAGNOSIS — N186 End stage renal disease: Secondary | ICD-10-CM | POA: Diagnosis not present

## 2018-10-31 DIAGNOSIS — E7841 Elevated Lipoprotein(a): Secondary | ICD-10-CM | POA: Diagnosis not present

## 2018-11-01 DIAGNOSIS — D509 Iron deficiency anemia, unspecified: Secondary | ICD-10-CM | POA: Diagnosis not present

## 2018-11-01 DIAGNOSIS — E7841 Elevated Lipoprotein(a): Secondary | ICD-10-CM | POA: Diagnosis not present

## 2018-11-01 DIAGNOSIS — D631 Anemia in chronic kidney disease: Secondary | ICD-10-CM | POA: Diagnosis not present

## 2018-11-01 DIAGNOSIS — N186 End stage renal disease: Secondary | ICD-10-CM | POA: Diagnosis not present

## 2018-11-01 DIAGNOSIS — N2581 Secondary hyperparathyroidism of renal origin: Secondary | ICD-10-CM | POA: Diagnosis not present

## 2018-11-01 DIAGNOSIS — Z79899 Other long term (current) drug therapy: Secondary | ICD-10-CM | POA: Diagnosis not present

## 2018-11-02 DIAGNOSIS — N2581 Secondary hyperparathyroidism of renal origin: Secondary | ICD-10-CM | POA: Diagnosis not present

## 2018-11-02 DIAGNOSIS — D631 Anemia in chronic kidney disease: Secondary | ICD-10-CM | POA: Diagnosis not present

## 2018-11-02 DIAGNOSIS — Z79899 Other long term (current) drug therapy: Secondary | ICD-10-CM | POA: Diagnosis not present

## 2018-11-02 DIAGNOSIS — N186 End stage renal disease: Secondary | ICD-10-CM | POA: Diagnosis not present

## 2018-11-02 DIAGNOSIS — E7841 Elevated Lipoprotein(a): Secondary | ICD-10-CM | POA: Diagnosis not present

## 2018-11-02 DIAGNOSIS — D509 Iron deficiency anemia, unspecified: Secondary | ICD-10-CM | POA: Diagnosis not present

## 2018-11-03 DIAGNOSIS — D509 Iron deficiency anemia, unspecified: Secondary | ICD-10-CM | POA: Diagnosis not present

## 2018-11-03 DIAGNOSIS — N2581 Secondary hyperparathyroidism of renal origin: Secondary | ICD-10-CM | POA: Diagnosis not present

## 2018-11-03 DIAGNOSIS — E7841 Elevated Lipoprotein(a): Secondary | ICD-10-CM | POA: Diagnosis not present

## 2018-11-03 DIAGNOSIS — Z79899 Other long term (current) drug therapy: Secondary | ICD-10-CM | POA: Diagnosis not present

## 2018-11-03 DIAGNOSIS — N186 End stage renal disease: Secondary | ICD-10-CM | POA: Diagnosis not present

## 2018-11-03 DIAGNOSIS — D631 Anemia in chronic kidney disease: Secondary | ICD-10-CM | POA: Diagnosis not present

## 2018-11-04 DIAGNOSIS — D631 Anemia in chronic kidney disease: Secondary | ICD-10-CM | POA: Diagnosis not present

## 2018-11-04 DIAGNOSIS — Z79899 Other long term (current) drug therapy: Secondary | ICD-10-CM | POA: Diagnosis not present

## 2018-11-04 DIAGNOSIS — N186 End stage renal disease: Secondary | ICD-10-CM | POA: Diagnosis not present

## 2018-11-04 DIAGNOSIS — E7841 Elevated Lipoprotein(a): Secondary | ICD-10-CM | POA: Diagnosis not present

## 2018-11-04 DIAGNOSIS — N2581 Secondary hyperparathyroidism of renal origin: Secondary | ICD-10-CM | POA: Diagnosis not present

## 2018-11-04 DIAGNOSIS — D509 Iron deficiency anemia, unspecified: Secondary | ICD-10-CM | POA: Diagnosis not present

## 2018-11-05 DIAGNOSIS — D631 Anemia in chronic kidney disease: Secondary | ICD-10-CM | POA: Diagnosis not present

## 2018-11-05 DIAGNOSIS — D509 Iron deficiency anemia, unspecified: Secondary | ICD-10-CM | POA: Diagnosis not present

## 2018-11-05 DIAGNOSIS — N2581 Secondary hyperparathyroidism of renal origin: Secondary | ICD-10-CM | POA: Diagnosis not present

## 2018-11-05 DIAGNOSIS — E7841 Elevated Lipoprotein(a): Secondary | ICD-10-CM | POA: Diagnosis not present

## 2018-11-05 DIAGNOSIS — Z79899 Other long term (current) drug therapy: Secondary | ICD-10-CM | POA: Diagnosis not present

## 2018-11-05 DIAGNOSIS — N186 End stage renal disease: Secondary | ICD-10-CM | POA: Diagnosis not present

## 2018-11-06 DIAGNOSIS — N186 End stage renal disease: Secondary | ICD-10-CM | POA: Diagnosis not present

## 2018-11-06 DIAGNOSIS — D509 Iron deficiency anemia, unspecified: Secondary | ICD-10-CM | POA: Diagnosis not present

## 2018-11-06 DIAGNOSIS — I129 Hypertensive chronic kidney disease with stage 1 through stage 4 chronic kidney disease, or unspecified chronic kidney disease: Secondary | ICD-10-CM | POA: Diagnosis not present

## 2018-11-06 DIAGNOSIS — K769 Liver disease, unspecified: Secondary | ICD-10-CM | POA: Diagnosis not present

## 2018-11-06 DIAGNOSIS — E7841 Elevated Lipoprotein(a): Secondary | ICD-10-CM | POA: Diagnosis not present

## 2018-11-06 DIAGNOSIS — E440 Moderate protein-calorie malnutrition: Secondary | ICD-10-CM | POA: Diagnosis not present

## 2018-11-06 DIAGNOSIS — D631 Anemia in chronic kidney disease: Secondary | ICD-10-CM | POA: Diagnosis not present

## 2018-11-06 DIAGNOSIS — Z79899 Other long term (current) drug therapy: Secondary | ICD-10-CM | POA: Diagnosis not present

## 2018-11-06 DIAGNOSIS — Z992 Dependence on renal dialysis: Secondary | ICD-10-CM | POA: Diagnosis not present

## 2018-11-06 DIAGNOSIS — N2581 Secondary hyperparathyroidism of renal origin: Secondary | ICD-10-CM | POA: Diagnosis not present

## 2018-11-07 DIAGNOSIS — D631 Anemia in chronic kidney disease: Secondary | ICD-10-CM | POA: Diagnosis not present

## 2018-11-07 DIAGNOSIS — Z79899 Other long term (current) drug therapy: Secondary | ICD-10-CM | POA: Diagnosis not present

## 2018-11-07 DIAGNOSIS — D509 Iron deficiency anemia, unspecified: Secondary | ICD-10-CM | POA: Diagnosis not present

## 2018-11-07 DIAGNOSIS — N2581 Secondary hyperparathyroidism of renal origin: Secondary | ICD-10-CM | POA: Diagnosis not present

## 2018-11-07 DIAGNOSIS — N186 End stage renal disease: Secondary | ICD-10-CM | POA: Diagnosis not present

## 2018-11-07 DIAGNOSIS — Z992 Dependence on renal dialysis: Secondary | ICD-10-CM | POA: Diagnosis not present

## 2018-11-08 DIAGNOSIS — D631 Anemia in chronic kidney disease: Secondary | ICD-10-CM | POA: Diagnosis not present

## 2018-11-08 DIAGNOSIS — Z79899 Other long term (current) drug therapy: Secondary | ICD-10-CM | POA: Diagnosis not present

## 2018-11-08 DIAGNOSIS — N186 End stage renal disease: Secondary | ICD-10-CM | POA: Diagnosis not present

## 2018-11-08 DIAGNOSIS — N2581 Secondary hyperparathyroidism of renal origin: Secondary | ICD-10-CM | POA: Diagnosis not present

## 2018-11-08 DIAGNOSIS — Z992 Dependence on renal dialysis: Secondary | ICD-10-CM | POA: Diagnosis not present

## 2018-11-08 DIAGNOSIS — D509 Iron deficiency anemia, unspecified: Secondary | ICD-10-CM | POA: Diagnosis not present

## 2018-11-09 DIAGNOSIS — D631 Anemia in chronic kidney disease: Secondary | ICD-10-CM | POA: Diagnosis not present

## 2018-11-09 DIAGNOSIS — Z992 Dependence on renal dialysis: Secondary | ICD-10-CM | POA: Diagnosis not present

## 2018-11-09 DIAGNOSIS — D509 Iron deficiency anemia, unspecified: Secondary | ICD-10-CM | POA: Diagnosis not present

## 2018-11-09 DIAGNOSIS — N2589 Other disorders resulting from impaired renal tubular function: Secondary | ICD-10-CM | POA: Diagnosis not present

## 2018-11-09 DIAGNOSIS — E7841 Elevated Lipoprotein(a): Secondary | ICD-10-CM | POA: Diagnosis not present

## 2018-11-09 DIAGNOSIS — N186 End stage renal disease: Secondary | ICD-10-CM | POA: Diagnosis not present

## 2018-11-09 DIAGNOSIS — Z79899 Other long term (current) drug therapy: Secondary | ICD-10-CM | POA: Diagnosis not present

## 2018-11-09 DIAGNOSIS — N2581 Secondary hyperparathyroidism of renal origin: Secondary | ICD-10-CM | POA: Diagnosis not present

## 2018-11-10 DIAGNOSIS — D631 Anemia in chronic kidney disease: Secondary | ICD-10-CM | POA: Diagnosis not present

## 2018-11-10 DIAGNOSIS — N186 End stage renal disease: Secondary | ICD-10-CM | POA: Diagnosis not present

## 2018-11-10 DIAGNOSIS — D509 Iron deficiency anemia, unspecified: Secondary | ICD-10-CM | POA: Diagnosis not present

## 2018-11-10 DIAGNOSIS — Z79899 Other long term (current) drug therapy: Secondary | ICD-10-CM | POA: Diagnosis not present

## 2018-11-10 DIAGNOSIS — Z992 Dependence on renal dialysis: Secondary | ICD-10-CM | POA: Diagnosis not present

## 2018-11-10 DIAGNOSIS — N2581 Secondary hyperparathyroidism of renal origin: Secondary | ICD-10-CM | POA: Diagnosis not present

## 2018-11-11 DIAGNOSIS — N186 End stage renal disease: Secondary | ICD-10-CM | POA: Diagnosis not present

## 2018-11-11 DIAGNOSIS — Z992 Dependence on renal dialysis: Secondary | ICD-10-CM | POA: Diagnosis not present

## 2018-11-11 DIAGNOSIS — Z79899 Other long term (current) drug therapy: Secondary | ICD-10-CM | POA: Diagnosis not present

## 2018-11-11 DIAGNOSIS — D509 Iron deficiency anemia, unspecified: Secondary | ICD-10-CM | POA: Diagnosis not present

## 2018-11-11 DIAGNOSIS — N2581 Secondary hyperparathyroidism of renal origin: Secondary | ICD-10-CM | POA: Diagnosis not present

## 2018-11-11 DIAGNOSIS — D631 Anemia in chronic kidney disease: Secondary | ICD-10-CM | POA: Diagnosis not present

## 2018-11-12 DIAGNOSIS — Z992 Dependence on renal dialysis: Secondary | ICD-10-CM | POA: Diagnosis not present

## 2018-11-12 DIAGNOSIS — Z79899 Other long term (current) drug therapy: Secondary | ICD-10-CM | POA: Diagnosis not present

## 2018-11-12 DIAGNOSIS — D631 Anemia in chronic kidney disease: Secondary | ICD-10-CM | POA: Diagnosis not present

## 2018-11-12 DIAGNOSIS — D509 Iron deficiency anemia, unspecified: Secondary | ICD-10-CM | POA: Diagnosis not present

## 2018-11-12 DIAGNOSIS — N186 End stage renal disease: Secondary | ICD-10-CM | POA: Diagnosis not present

## 2018-11-12 DIAGNOSIS — N2581 Secondary hyperparathyroidism of renal origin: Secondary | ICD-10-CM | POA: Diagnosis not present

## 2018-11-13 DIAGNOSIS — Z992 Dependence on renal dialysis: Secondary | ICD-10-CM | POA: Diagnosis not present

## 2018-11-13 DIAGNOSIS — D509 Iron deficiency anemia, unspecified: Secondary | ICD-10-CM | POA: Diagnosis not present

## 2018-11-13 DIAGNOSIS — N186 End stage renal disease: Secondary | ICD-10-CM | POA: Diagnosis not present

## 2018-11-13 DIAGNOSIS — D631 Anemia in chronic kidney disease: Secondary | ICD-10-CM | POA: Diagnosis not present

## 2018-11-13 DIAGNOSIS — Z79899 Other long term (current) drug therapy: Secondary | ICD-10-CM | POA: Diagnosis not present

## 2018-11-13 DIAGNOSIS — N2581 Secondary hyperparathyroidism of renal origin: Secondary | ICD-10-CM | POA: Diagnosis not present

## 2018-11-14 DIAGNOSIS — Z992 Dependence on renal dialysis: Secondary | ICD-10-CM | POA: Diagnosis not present

## 2018-11-14 DIAGNOSIS — D509 Iron deficiency anemia, unspecified: Secondary | ICD-10-CM | POA: Diagnosis not present

## 2018-11-14 DIAGNOSIS — N186 End stage renal disease: Secondary | ICD-10-CM | POA: Diagnosis not present

## 2018-11-14 DIAGNOSIS — N2581 Secondary hyperparathyroidism of renal origin: Secondary | ICD-10-CM | POA: Diagnosis not present

## 2018-11-14 DIAGNOSIS — Z79899 Other long term (current) drug therapy: Secondary | ICD-10-CM | POA: Diagnosis not present

## 2018-11-14 DIAGNOSIS — D631 Anemia in chronic kidney disease: Secondary | ICD-10-CM | POA: Diagnosis not present

## 2018-11-15 DIAGNOSIS — D631 Anemia in chronic kidney disease: Secondary | ICD-10-CM | POA: Diagnosis not present

## 2018-11-15 DIAGNOSIS — N2581 Secondary hyperparathyroidism of renal origin: Secondary | ICD-10-CM | POA: Diagnosis not present

## 2018-11-15 DIAGNOSIS — D509 Iron deficiency anemia, unspecified: Secondary | ICD-10-CM | POA: Diagnosis not present

## 2018-11-15 DIAGNOSIS — N186 End stage renal disease: Secondary | ICD-10-CM | POA: Diagnosis not present

## 2018-11-15 DIAGNOSIS — Z992 Dependence on renal dialysis: Secondary | ICD-10-CM | POA: Diagnosis not present

## 2018-11-15 DIAGNOSIS — Z79899 Other long term (current) drug therapy: Secondary | ICD-10-CM | POA: Diagnosis not present

## 2018-11-16 DIAGNOSIS — D631 Anemia in chronic kidney disease: Secondary | ICD-10-CM | POA: Diagnosis not present

## 2018-11-16 DIAGNOSIS — Z79899 Other long term (current) drug therapy: Secondary | ICD-10-CM | POA: Diagnosis not present

## 2018-11-16 DIAGNOSIS — N186 End stage renal disease: Secondary | ICD-10-CM | POA: Diagnosis not present

## 2018-11-16 DIAGNOSIS — N2581 Secondary hyperparathyroidism of renal origin: Secondary | ICD-10-CM | POA: Diagnosis not present

## 2018-11-16 DIAGNOSIS — Z992 Dependence on renal dialysis: Secondary | ICD-10-CM | POA: Diagnosis not present

## 2018-11-16 DIAGNOSIS — D509 Iron deficiency anemia, unspecified: Secondary | ICD-10-CM | POA: Diagnosis not present

## 2018-11-17 DIAGNOSIS — Z79899 Other long term (current) drug therapy: Secondary | ICD-10-CM | POA: Diagnosis not present

## 2018-11-17 DIAGNOSIS — D631 Anemia in chronic kidney disease: Secondary | ICD-10-CM | POA: Diagnosis not present

## 2018-11-17 DIAGNOSIS — N2581 Secondary hyperparathyroidism of renal origin: Secondary | ICD-10-CM | POA: Diagnosis not present

## 2018-11-17 DIAGNOSIS — N186 End stage renal disease: Secondary | ICD-10-CM | POA: Diagnosis not present

## 2018-11-17 DIAGNOSIS — Z992 Dependence on renal dialysis: Secondary | ICD-10-CM | POA: Diagnosis not present

## 2018-11-17 DIAGNOSIS — D509 Iron deficiency anemia, unspecified: Secondary | ICD-10-CM | POA: Diagnosis not present

## 2018-11-18 DIAGNOSIS — N2581 Secondary hyperparathyroidism of renal origin: Secondary | ICD-10-CM | POA: Diagnosis not present

## 2018-11-18 DIAGNOSIS — Z79899 Other long term (current) drug therapy: Secondary | ICD-10-CM | POA: Diagnosis not present

## 2018-11-18 DIAGNOSIS — D509 Iron deficiency anemia, unspecified: Secondary | ICD-10-CM | POA: Diagnosis not present

## 2018-11-18 DIAGNOSIS — Z992 Dependence on renal dialysis: Secondary | ICD-10-CM | POA: Diagnosis not present

## 2018-11-18 DIAGNOSIS — D631 Anemia in chronic kidney disease: Secondary | ICD-10-CM | POA: Diagnosis not present

## 2018-11-18 DIAGNOSIS — N186 End stage renal disease: Secondary | ICD-10-CM | POA: Diagnosis not present

## 2018-11-19 DIAGNOSIS — N186 End stage renal disease: Secondary | ICD-10-CM | POA: Diagnosis not present

## 2018-11-19 DIAGNOSIS — D509 Iron deficiency anemia, unspecified: Secondary | ICD-10-CM | POA: Diagnosis not present

## 2018-11-19 DIAGNOSIS — Z992 Dependence on renal dialysis: Secondary | ICD-10-CM | POA: Diagnosis not present

## 2018-11-19 DIAGNOSIS — N2581 Secondary hyperparathyroidism of renal origin: Secondary | ICD-10-CM | POA: Diagnosis not present

## 2018-11-19 DIAGNOSIS — Z79899 Other long term (current) drug therapy: Secondary | ICD-10-CM | POA: Diagnosis not present

## 2018-11-19 DIAGNOSIS — D631 Anemia in chronic kidney disease: Secondary | ICD-10-CM | POA: Diagnosis not present

## 2018-11-20 DIAGNOSIS — N2581 Secondary hyperparathyroidism of renal origin: Secondary | ICD-10-CM | POA: Diagnosis not present

## 2018-11-20 DIAGNOSIS — Z992 Dependence on renal dialysis: Secondary | ICD-10-CM | POA: Diagnosis not present

## 2018-11-20 DIAGNOSIS — D509 Iron deficiency anemia, unspecified: Secondary | ICD-10-CM | POA: Diagnosis not present

## 2018-11-20 DIAGNOSIS — D631 Anemia in chronic kidney disease: Secondary | ICD-10-CM | POA: Diagnosis not present

## 2018-11-20 DIAGNOSIS — N186 End stage renal disease: Secondary | ICD-10-CM | POA: Diagnosis not present

## 2018-11-20 DIAGNOSIS — Z79899 Other long term (current) drug therapy: Secondary | ICD-10-CM | POA: Diagnosis not present

## 2018-11-21 DIAGNOSIS — N186 End stage renal disease: Secondary | ICD-10-CM | POA: Diagnosis not present

## 2018-11-21 DIAGNOSIS — Z79899 Other long term (current) drug therapy: Secondary | ICD-10-CM | POA: Diagnosis not present

## 2018-11-21 DIAGNOSIS — Z992 Dependence on renal dialysis: Secondary | ICD-10-CM | POA: Diagnosis not present

## 2018-11-21 DIAGNOSIS — N2581 Secondary hyperparathyroidism of renal origin: Secondary | ICD-10-CM | POA: Diagnosis not present

## 2018-11-21 DIAGNOSIS — D509 Iron deficiency anemia, unspecified: Secondary | ICD-10-CM | POA: Diagnosis not present

## 2018-11-21 DIAGNOSIS — D631 Anemia in chronic kidney disease: Secondary | ICD-10-CM | POA: Diagnosis not present

## 2018-11-22 DIAGNOSIS — Z79899 Other long term (current) drug therapy: Secondary | ICD-10-CM | POA: Diagnosis not present

## 2018-11-22 DIAGNOSIS — D509 Iron deficiency anemia, unspecified: Secondary | ICD-10-CM | POA: Diagnosis not present

## 2018-11-22 DIAGNOSIS — N186 End stage renal disease: Secondary | ICD-10-CM | POA: Diagnosis not present

## 2018-11-22 DIAGNOSIS — Z992 Dependence on renal dialysis: Secondary | ICD-10-CM | POA: Diagnosis not present

## 2018-11-22 DIAGNOSIS — D631 Anemia in chronic kidney disease: Secondary | ICD-10-CM | POA: Diagnosis not present

## 2018-11-22 DIAGNOSIS — N2581 Secondary hyperparathyroidism of renal origin: Secondary | ICD-10-CM | POA: Diagnosis not present

## 2018-11-23 DIAGNOSIS — N2581 Secondary hyperparathyroidism of renal origin: Secondary | ICD-10-CM | POA: Diagnosis not present

## 2018-11-23 DIAGNOSIS — Z992 Dependence on renal dialysis: Secondary | ICD-10-CM | POA: Diagnosis not present

## 2018-11-23 DIAGNOSIS — N186 End stage renal disease: Secondary | ICD-10-CM | POA: Diagnosis not present

## 2018-11-23 DIAGNOSIS — Z79899 Other long term (current) drug therapy: Secondary | ICD-10-CM | POA: Diagnosis not present

## 2018-11-23 DIAGNOSIS — D631 Anemia in chronic kidney disease: Secondary | ICD-10-CM | POA: Diagnosis not present

## 2018-11-23 DIAGNOSIS — D509 Iron deficiency anemia, unspecified: Secondary | ICD-10-CM | POA: Diagnosis not present

## 2018-11-24 DIAGNOSIS — Z992 Dependence on renal dialysis: Secondary | ICD-10-CM | POA: Diagnosis not present

## 2018-11-24 DIAGNOSIS — D631 Anemia in chronic kidney disease: Secondary | ICD-10-CM | POA: Diagnosis not present

## 2018-11-24 DIAGNOSIS — Z79899 Other long term (current) drug therapy: Secondary | ICD-10-CM | POA: Diagnosis not present

## 2018-11-24 DIAGNOSIS — N2581 Secondary hyperparathyroidism of renal origin: Secondary | ICD-10-CM | POA: Diagnosis not present

## 2018-11-24 DIAGNOSIS — N186 End stage renal disease: Secondary | ICD-10-CM | POA: Diagnosis not present

## 2018-11-24 DIAGNOSIS — D509 Iron deficiency anemia, unspecified: Secondary | ICD-10-CM | POA: Diagnosis not present

## 2018-11-25 DIAGNOSIS — D631 Anemia in chronic kidney disease: Secondary | ICD-10-CM | POA: Diagnosis not present

## 2018-11-25 DIAGNOSIS — Z992 Dependence on renal dialysis: Secondary | ICD-10-CM | POA: Diagnosis not present

## 2018-11-25 DIAGNOSIS — D509 Iron deficiency anemia, unspecified: Secondary | ICD-10-CM | POA: Diagnosis not present

## 2018-11-25 DIAGNOSIS — N186 End stage renal disease: Secondary | ICD-10-CM | POA: Diagnosis not present

## 2018-11-25 DIAGNOSIS — N2581 Secondary hyperparathyroidism of renal origin: Secondary | ICD-10-CM | POA: Diagnosis not present

## 2018-11-25 DIAGNOSIS — Z79899 Other long term (current) drug therapy: Secondary | ICD-10-CM | POA: Diagnosis not present

## 2018-11-26 DIAGNOSIS — N2581 Secondary hyperparathyroidism of renal origin: Secondary | ICD-10-CM | POA: Diagnosis not present

## 2018-11-26 DIAGNOSIS — N186 End stage renal disease: Secondary | ICD-10-CM | POA: Diagnosis not present

## 2018-11-26 DIAGNOSIS — Z992 Dependence on renal dialysis: Secondary | ICD-10-CM | POA: Diagnosis not present

## 2018-11-26 DIAGNOSIS — D631 Anemia in chronic kidney disease: Secondary | ICD-10-CM | POA: Diagnosis not present

## 2018-11-26 DIAGNOSIS — D509 Iron deficiency anemia, unspecified: Secondary | ICD-10-CM | POA: Diagnosis not present

## 2018-11-26 DIAGNOSIS — Z79899 Other long term (current) drug therapy: Secondary | ICD-10-CM | POA: Diagnosis not present

## 2018-11-27 DIAGNOSIS — N186 End stage renal disease: Secondary | ICD-10-CM | POA: Diagnosis not present

## 2018-11-27 DIAGNOSIS — D631 Anemia in chronic kidney disease: Secondary | ICD-10-CM | POA: Diagnosis not present

## 2018-11-27 DIAGNOSIS — D509 Iron deficiency anemia, unspecified: Secondary | ICD-10-CM | POA: Diagnosis not present

## 2018-11-27 DIAGNOSIS — Z992 Dependence on renal dialysis: Secondary | ICD-10-CM | POA: Diagnosis not present

## 2018-11-27 DIAGNOSIS — N2581 Secondary hyperparathyroidism of renal origin: Secondary | ICD-10-CM | POA: Diagnosis not present

## 2018-11-27 DIAGNOSIS — Z79899 Other long term (current) drug therapy: Secondary | ICD-10-CM | POA: Diagnosis not present

## 2018-11-28 DIAGNOSIS — Z992 Dependence on renal dialysis: Secondary | ICD-10-CM | POA: Diagnosis not present

## 2018-11-28 DIAGNOSIS — N2581 Secondary hyperparathyroidism of renal origin: Secondary | ICD-10-CM | POA: Diagnosis not present

## 2018-11-28 DIAGNOSIS — D631 Anemia in chronic kidney disease: Secondary | ICD-10-CM | POA: Diagnosis not present

## 2018-11-28 DIAGNOSIS — D509 Iron deficiency anemia, unspecified: Secondary | ICD-10-CM | POA: Diagnosis not present

## 2018-11-28 DIAGNOSIS — Z79899 Other long term (current) drug therapy: Secondary | ICD-10-CM | POA: Diagnosis not present

## 2018-11-28 DIAGNOSIS — N186 End stage renal disease: Secondary | ICD-10-CM | POA: Diagnosis not present

## 2018-11-29 DIAGNOSIS — Z992 Dependence on renal dialysis: Secondary | ICD-10-CM | POA: Diagnosis not present

## 2018-11-29 DIAGNOSIS — N2581 Secondary hyperparathyroidism of renal origin: Secondary | ICD-10-CM | POA: Diagnosis not present

## 2018-11-29 DIAGNOSIS — N186 End stage renal disease: Secondary | ICD-10-CM | POA: Diagnosis not present

## 2018-11-29 DIAGNOSIS — D509 Iron deficiency anemia, unspecified: Secondary | ICD-10-CM | POA: Diagnosis not present

## 2018-11-29 DIAGNOSIS — Z79899 Other long term (current) drug therapy: Secondary | ICD-10-CM | POA: Diagnosis not present

## 2018-11-29 DIAGNOSIS — D631 Anemia in chronic kidney disease: Secondary | ICD-10-CM | POA: Diagnosis not present

## 2018-11-30 DIAGNOSIS — Z79899 Other long term (current) drug therapy: Secondary | ICD-10-CM | POA: Diagnosis not present

## 2018-11-30 DIAGNOSIS — N2581 Secondary hyperparathyroidism of renal origin: Secondary | ICD-10-CM | POA: Diagnosis not present

## 2018-11-30 DIAGNOSIS — D509 Iron deficiency anemia, unspecified: Secondary | ICD-10-CM | POA: Diagnosis not present

## 2018-11-30 DIAGNOSIS — N186 End stage renal disease: Secondary | ICD-10-CM | POA: Diagnosis not present

## 2018-11-30 DIAGNOSIS — D631 Anemia in chronic kidney disease: Secondary | ICD-10-CM | POA: Diagnosis not present

## 2018-11-30 DIAGNOSIS — Z992 Dependence on renal dialysis: Secondary | ICD-10-CM | POA: Diagnosis not present

## 2018-12-01 DIAGNOSIS — Z992 Dependence on renal dialysis: Secondary | ICD-10-CM | POA: Diagnosis not present

## 2018-12-01 DIAGNOSIS — N186 End stage renal disease: Secondary | ICD-10-CM | POA: Diagnosis not present

## 2018-12-01 DIAGNOSIS — N2581 Secondary hyperparathyroidism of renal origin: Secondary | ICD-10-CM | POA: Diagnosis not present

## 2018-12-01 DIAGNOSIS — D509 Iron deficiency anemia, unspecified: Secondary | ICD-10-CM | POA: Diagnosis not present

## 2018-12-01 DIAGNOSIS — Z79899 Other long term (current) drug therapy: Secondary | ICD-10-CM | POA: Diagnosis not present

## 2018-12-01 DIAGNOSIS — D631 Anemia in chronic kidney disease: Secondary | ICD-10-CM | POA: Diagnosis not present

## 2018-12-02 DIAGNOSIS — D631 Anemia in chronic kidney disease: Secondary | ICD-10-CM | POA: Diagnosis not present

## 2018-12-02 DIAGNOSIS — D509 Iron deficiency anemia, unspecified: Secondary | ICD-10-CM | POA: Diagnosis not present

## 2018-12-02 DIAGNOSIS — N2581 Secondary hyperparathyroidism of renal origin: Secondary | ICD-10-CM | POA: Diagnosis not present

## 2018-12-02 DIAGNOSIS — N186 End stage renal disease: Secondary | ICD-10-CM | POA: Diagnosis not present

## 2018-12-02 DIAGNOSIS — Z79899 Other long term (current) drug therapy: Secondary | ICD-10-CM | POA: Diagnosis not present

## 2018-12-02 DIAGNOSIS — Z992 Dependence on renal dialysis: Secondary | ICD-10-CM | POA: Diagnosis not present

## 2018-12-03 DIAGNOSIS — N186 End stage renal disease: Secondary | ICD-10-CM | POA: Diagnosis not present

## 2018-12-03 DIAGNOSIS — Z992 Dependence on renal dialysis: Secondary | ICD-10-CM | POA: Diagnosis not present

## 2018-12-03 DIAGNOSIS — Z79899 Other long term (current) drug therapy: Secondary | ICD-10-CM | POA: Diagnosis not present

## 2018-12-03 DIAGNOSIS — D509 Iron deficiency anemia, unspecified: Secondary | ICD-10-CM | POA: Diagnosis not present

## 2018-12-03 DIAGNOSIS — D631 Anemia in chronic kidney disease: Secondary | ICD-10-CM | POA: Diagnosis not present

## 2018-12-03 DIAGNOSIS — N2581 Secondary hyperparathyroidism of renal origin: Secondary | ICD-10-CM | POA: Diagnosis not present

## 2018-12-04 DIAGNOSIS — N186 End stage renal disease: Secondary | ICD-10-CM | POA: Diagnosis not present

## 2018-12-04 DIAGNOSIS — Z992 Dependence on renal dialysis: Secondary | ICD-10-CM | POA: Diagnosis not present

## 2018-12-04 DIAGNOSIS — D631 Anemia in chronic kidney disease: Secondary | ICD-10-CM | POA: Diagnosis not present

## 2018-12-04 DIAGNOSIS — Z79899 Other long term (current) drug therapy: Secondary | ICD-10-CM | POA: Diagnosis not present

## 2018-12-04 DIAGNOSIS — D509 Iron deficiency anemia, unspecified: Secondary | ICD-10-CM | POA: Diagnosis not present

## 2018-12-04 DIAGNOSIS — N2581 Secondary hyperparathyroidism of renal origin: Secondary | ICD-10-CM | POA: Diagnosis not present

## 2018-12-05 DIAGNOSIS — D509 Iron deficiency anemia, unspecified: Secondary | ICD-10-CM | POA: Diagnosis not present

## 2018-12-05 DIAGNOSIS — Z992 Dependence on renal dialysis: Secondary | ICD-10-CM | POA: Diagnosis not present

## 2018-12-05 DIAGNOSIS — D631 Anemia in chronic kidney disease: Secondary | ICD-10-CM | POA: Diagnosis not present

## 2018-12-05 DIAGNOSIS — N2581 Secondary hyperparathyroidism of renal origin: Secondary | ICD-10-CM | POA: Diagnosis not present

## 2018-12-05 DIAGNOSIS — N186 End stage renal disease: Secondary | ICD-10-CM | POA: Diagnosis not present

## 2018-12-05 DIAGNOSIS — Z79899 Other long term (current) drug therapy: Secondary | ICD-10-CM | POA: Diagnosis not present

## 2018-12-06 DIAGNOSIS — D631 Anemia in chronic kidney disease: Secondary | ICD-10-CM | POA: Diagnosis not present

## 2018-12-06 DIAGNOSIS — N2581 Secondary hyperparathyroidism of renal origin: Secondary | ICD-10-CM | POA: Diagnosis not present

## 2018-12-06 DIAGNOSIS — D509 Iron deficiency anemia, unspecified: Secondary | ICD-10-CM | POA: Diagnosis not present

## 2018-12-06 DIAGNOSIS — N186 End stage renal disease: Secondary | ICD-10-CM | POA: Diagnosis not present

## 2018-12-06 DIAGNOSIS — Z992 Dependence on renal dialysis: Secondary | ICD-10-CM | POA: Diagnosis not present

## 2018-12-06 DIAGNOSIS — Z79899 Other long term (current) drug therapy: Secondary | ICD-10-CM | POA: Diagnosis not present

## 2018-12-07 DIAGNOSIS — N186 End stage renal disease: Secondary | ICD-10-CM | POA: Diagnosis not present

## 2018-12-07 DIAGNOSIS — E440 Moderate protein-calorie malnutrition: Secondary | ICD-10-CM | POA: Diagnosis not present

## 2018-12-07 DIAGNOSIS — D631 Anemia in chronic kidney disease: Secondary | ICD-10-CM | POA: Diagnosis not present

## 2018-12-07 DIAGNOSIS — D509 Iron deficiency anemia, unspecified: Secondary | ICD-10-CM | POA: Diagnosis not present

## 2018-12-07 DIAGNOSIS — Z79899 Other long term (current) drug therapy: Secondary | ICD-10-CM | POA: Diagnosis not present

## 2018-12-07 DIAGNOSIS — E7841 Elevated Lipoprotein(a): Secondary | ICD-10-CM | POA: Diagnosis not present

## 2018-12-07 DIAGNOSIS — I129 Hypertensive chronic kidney disease with stage 1 through stage 4 chronic kidney disease, or unspecified chronic kidney disease: Secondary | ICD-10-CM | POA: Diagnosis not present

## 2018-12-07 DIAGNOSIS — N2581 Secondary hyperparathyroidism of renal origin: Secondary | ICD-10-CM | POA: Diagnosis not present

## 2018-12-07 DIAGNOSIS — K769 Liver disease, unspecified: Secondary | ICD-10-CM | POA: Diagnosis not present

## 2018-12-07 DIAGNOSIS — Z992 Dependence on renal dialysis: Secondary | ICD-10-CM | POA: Diagnosis not present

## 2018-12-08 DIAGNOSIS — E440 Moderate protein-calorie malnutrition: Secondary | ICD-10-CM | POA: Diagnosis not present

## 2018-12-08 DIAGNOSIS — N2589 Other disorders resulting from impaired renal tubular function: Secondary | ICD-10-CM | POA: Diagnosis not present

## 2018-12-08 DIAGNOSIS — Z79899 Other long term (current) drug therapy: Secondary | ICD-10-CM | POA: Diagnosis not present

## 2018-12-08 DIAGNOSIS — K769 Liver disease, unspecified: Secondary | ICD-10-CM | POA: Diagnosis not present

## 2018-12-08 DIAGNOSIS — N2581 Secondary hyperparathyroidism of renal origin: Secondary | ICD-10-CM | POA: Diagnosis not present

## 2018-12-08 DIAGNOSIS — E7841 Elevated Lipoprotein(a): Secondary | ICD-10-CM | POA: Diagnosis not present

## 2018-12-08 DIAGNOSIS — N186 End stage renal disease: Secondary | ICD-10-CM | POA: Diagnosis not present

## 2018-12-08 DIAGNOSIS — Z992 Dependence on renal dialysis: Secondary | ICD-10-CM | POA: Diagnosis not present

## 2018-12-09 DIAGNOSIS — Z79899 Other long term (current) drug therapy: Secondary | ICD-10-CM | POA: Diagnosis not present

## 2018-12-09 DIAGNOSIS — E440 Moderate protein-calorie malnutrition: Secondary | ICD-10-CM | POA: Diagnosis not present

## 2018-12-09 DIAGNOSIS — N186 End stage renal disease: Secondary | ICD-10-CM | POA: Diagnosis not present

## 2018-12-09 DIAGNOSIS — N2581 Secondary hyperparathyroidism of renal origin: Secondary | ICD-10-CM | POA: Diagnosis not present

## 2018-12-09 DIAGNOSIS — Z992 Dependence on renal dialysis: Secondary | ICD-10-CM | POA: Diagnosis not present

## 2018-12-09 DIAGNOSIS — K769 Liver disease, unspecified: Secondary | ICD-10-CM | POA: Diagnosis not present

## 2018-12-10 DIAGNOSIS — N2581 Secondary hyperparathyroidism of renal origin: Secondary | ICD-10-CM | POA: Diagnosis not present

## 2018-12-10 DIAGNOSIS — N186 End stage renal disease: Secondary | ICD-10-CM | POA: Diagnosis not present

## 2018-12-10 DIAGNOSIS — Z79899 Other long term (current) drug therapy: Secondary | ICD-10-CM | POA: Diagnosis not present

## 2018-12-10 DIAGNOSIS — E440 Moderate protein-calorie malnutrition: Secondary | ICD-10-CM | POA: Diagnosis not present

## 2018-12-10 DIAGNOSIS — Z992 Dependence on renal dialysis: Secondary | ICD-10-CM | POA: Diagnosis not present

## 2018-12-10 DIAGNOSIS — K769 Liver disease, unspecified: Secondary | ICD-10-CM | POA: Diagnosis not present

## 2018-12-11 DIAGNOSIS — N186 End stage renal disease: Secondary | ICD-10-CM | POA: Diagnosis not present

## 2018-12-11 DIAGNOSIS — K769 Liver disease, unspecified: Secondary | ICD-10-CM | POA: Diagnosis not present

## 2018-12-11 DIAGNOSIS — Z79899 Other long term (current) drug therapy: Secondary | ICD-10-CM | POA: Diagnosis not present

## 2018-12-11 DIAGNOSIS — E440 Moderate protein-calorie malnutrition: Secondary | ICD-10-CM | POA: Diagnosis not present

## 2018-12-11 DIAGNOSIS — Z992 Dependence on renal dialysis: Secondary | ICD-10-CM | POA: Diagnosis not present

## 2018-12-11 DIAGNOSIS — N2581 Secondary hyperparathyroidism of renal origin: Secondary | ICD-10-CM | POA: Diagnosis not present

## 2018-12-12 DIAGNOSIS — N2581 Secondary hyperparathyroidism of renal origin: Secondary | ICD-10-CM | POA: Diagnosis not present

## 2018-12-12 DIAGNOSIS — E440 Moderate protein-calorie malnutrition: Secondary | ICD-10-CM | POA: Diagnosis not present

## 2018-12-12 DIAGNOSIS — Z992 Dependence on renal dialysis: Secondary | ICD-10-CM | POA: Diagnosis not present

## 2018-12-12 DIAGNOSIS — Z79899 Other long term (current) drug therapy: Secondary | ICD-10-CM | POA: Diagnosis not present

## 2018-12-12 DIAGNOSIS — K769 Liver disease, unspecified: Secondary | ICD-10-CM | POA: Diagnosis not present

## 2018-12-12 DIAGNOSIS — N186 End stage renal disease: Secondary | ICD-10-CM | POA: Diagnosis not present

## 2018-12-13 DIAGNOSIS — Z992 Dependence on renal dialysis: Secondary | ICD-10-CM | POA: Diagnosis not present

## 2018-12-13 DIAGNOSIS — K769 Liver disease, unspecified: Secondary | ICD-10-CM | POA: Diagnosis not present

## 2018-12-13 DIAGNOSIS — N186 End stage renal disease: Secondary | ICD-10-CM | POA: Diagnosis not present

## 2018-12-13 DIAGNOSIS — N2581 Secondary hyperparathyroidism of renal origin: Secondary | ICD-10-CM | POA: Diagnosis not present

## 2018-12-13 DIAGNOSIS — E440 Moderate protein-calorie malnutrition: Secondary | ICD-10-CM | POA: Diagnosis not present

## 2018-12-13 DIAGNOSIS — Z79899 Other long term (current) drug therapy: Secondary | ICD-10-CM | POA: Diagnosis not present

## 2018-12-14 DIAGNOSIS — Z79899 Other long term (current) drug therapy: Secondary | ICD-10-CM | POA: Diagnosis not present

## 2018-12-14 DIAGNOSIS — N2581 Secondary hyperparathyroidism of renal origin: Secondary | ICD-10-CM | POA: Diagnosis not present

## 2018-12-14 DIAGNOSIS — N186 End stage renal disease: Secondary | ICD-10-CM | POA: Diagnosis not present

## 2018-12-14 DIAGNOSIS — Z992 Dependence on renal dialysis: Secondary | ICD-10-CM | POA: Diagnosis not present

## 2018-12-14 DIAGNOSIS — E440 Moderate protein-calorie malnutrition: Secondary | ICD-10-CM | POA: Diagnosis not present

## 2018-12-14 DIAGNOSIS — K769 Liver disease, unspecified: Secondary | ICD-10-CM | POA: Diagnosis not present

## 2018-12-15 DIAGNOSIS — K769 Liver disease, unspecified: Secondary | ICD-10-CM | POA: Diagnosis not present

## 2018-12-15 DIAGNOSIS — N2581 Secondary hyperparathyroidism of renal origin: Secondary | ICD-10-CM | POA: Diagnosis not present

## 2018-12-15 DIAGNOSIS — Z992 Dependence on renal dialysis: Secondary | ICD-10-CM | POA: Diagnosis not present

## 2018-12-15 DIAGNOSIS — Z79899 Other long term (current) drug therapy: Secondary | ICD-10-CM | POA: Diagnosis not present

## 2018-12-15 DIAGNOSIS — E440 Moderate protein-calorie malnutrition: Secondary | ICD-10-CM | POA: Diagnosis not present

## 2018-12-15 DIAGNOSIS — N186 End stage renal disease: Secondary | ICD-10-CM | POA: Diagnosis not present

## 2018-12-16 DIAGNOSIS — N2581 Secondary hyperparathyroidism of renal origin: Secondary | ICD-10-CM | POA: Diagnosis not present

## 2018-12-16 DIAGNOSIS — Z79899 Other long term (current) drug therapy: Secondary | ICD-10-CM | POA: Diagnosis not present

## 2018-12-16 DIAGNOSIS — Z992 Dependence on renal dialysis: Secondary | ICD-10-CM | POA: Diagnosis not present

## 2018-12-16 DIAGNOSIS — E440 Moderate protein-calorie malnutrition: Secondary | ICD-10-CM | POA: Diagnosis not present

## 2018-12-16 DIAGNOSIS — N186 End stage renal disease: Secondary | ICD-10-CM | POA: Diagnosis not present

## 2018-12-16 DIAGNOSIS — K769 Liver disease, unspecified: Secondary | ICD-10-CM | POA: Diagnosis not present

## 2018-12-17 DIAGNOSIS — N186 End stage renal disease: Secondary | ICD-10-CM | POA: Diagnosis not present

## 2018-12-17 DIAGNOSIS — E440 Moderate protein-calorie malnutrition: Secondary | ICD-10-CM | POA: Diagnosis not present

## 2018-12-17 DIAGNOSIS — Z79899 Other long term (current) drug therapy: Secondary | ICD-10-CM | POA: Diagnosis not present

## 2018-12-17 DIAGNOSIS — Z992 Dependence on renal dialysis: Secondary | ICD-10-CM | POA: Diagnosis not present

## 2018-12-17 DIAGNOSIS — K769 Liver disease, unspecified: Secondary | ICD-10-CM | POA: Diagnosis not present

## 2018-12-17 DIAGNOSIS — N2581 Secondary hyperparathyroidism of renal origin: Secondary | ICD-10-CM | POA: Diagnosis not present

## 2018-12-18 DIAGNOSIS — N186 End stage renal disease: Secondary | ICD-10-CM | POA: Diagnosis not present

## 2018-12-18 DIAGNOSIS — Z992 Dependence on renal dialysis: Secondary | ICD-10-CM | POA: Diagnosis not present

## 2018-12-18 DIAGNOSIS — Z79899 Other long term (current) drug therapy: Secondary | ICD-10-CM | POA: Diagnosis not present

## 2018-12-18 DIAGNOSIS — E440 Moderate protein-calorie malnutrition: Secondary | ICD-10-CM | POA: Diagnosis not present

## 2018-12-18 DIAGNOSIS — K769 Liver disease, unspecified: Secondary | ICD-10-CM | POA: Diagnosis not present

## 2018-12-18 DIAGNOSIS — N2581 Secondary hyperparathyroidism of renal origin: Secondary | ICD-10-CM | POA: Diagnosis not present

## 2018-12-19 DIAGNOSIS — Z79899 Other long term (current) drug therapy: Secondary | ICD-10-CM | POA: Diagnosis not present

## 2018-12-19 DIAGNOSIS — K769 Liver disease, unspecified: Secondary | ICD-10-CM | POA: Diagnosis not present

## 2018-12-19 DIAGNOSIS — Z992 Dependence on renal dialysis: Secondary | ICD-10-CM | POA: Diagnosis not present

## 2018-12-19 DIAGNOSIS — N186 End stage renal disease: Secondary | ICD-10-CM | POA: Diagnosis not present

## 2018-12-19 DIAGNOSIS — N2581 Secondary hyperparathyroidism of renal origin: Secondary | ICD-10-CM | POA: Diagnosis not present

## 2018-12-19 DIAGNOSIS — E440 Moderate protein-calorie malnutrition: Secondary | ICD-10-CM | POA: Diagnosis not present

## 2018-12-20 DIAGNOSIS — Z79899 Other long term (current) drug therapy: Secondary | ICD-10-CM | POA: Diagnosis not present

## 2018-12-20 DIAGNOSIS — K769 Liver disease, unspecified: Secondary | ICD-10-CM | POA: Diagnosis not present

## 2018-12-20 DIAGNOSIS — E440 Moderate protein-calorie malnutrition: Secondary | ICD-10-CM | POA: Diagnosis not present

## 2018-12-20 DIAGNOSIS — N186 End stage renal disease: Secondary | ICD-10-CM | POA: Diagnosis not present

## 2018-12-20 DIAGNOSIS — N2581 Secondary hyperparathyroidism of renal origin: Secondary | ICD-10-CM | POA: Diagnosis not present

## 2018-12-20 DIAGNOSIS — Z992 Dependence on renal dialysis: Secondary | ICD-10-CM | POA: Diagnosis not present

## 2018-12-21 DIAGNOSIS — N186 End stage renal disease: Secondary | ICD-10-CM | POA: Diagnosis not present

## 2018-12-21 DIAGNOSIS — K769 Liver disease, unspecified: Secondary | ICD-10-CM | POA: Diagnosis not present

## 2018-12-21 DIAGNOSIS — Z79899 Other long term (current) drug therapy: Secondary | ICD-10-CM | POA: Diagnosis not present

## 2018-12-21 DIAGNOSIS — E440 Moderate protein-calorie malnutrition: Secondary | ICD-10-CM | POA: Diagnosis not present

## 2018-12-21 DIAGNOSIS — Z992 Dependence on renal dialysis: Secondary | ICD-10-CM | POA: Diagnosis not present

## 2018-12-21 DIAGNOSIS — N2581 Secondary hyperparathyroidism of renal origin: Secondary | ICD-10-CM | POA: Diagnosis not present

## 2018-12-22 DIAGNOSIS — N186 End stage renal disease: Secondary | ICD-10-CM | POA: Diagnosis not present

## 2018-12-22 DIAGNOSIS — N2581 Secondary hyperparathyroidism of renal origin: Secondary | ICD-10-CM | POA: Diagnosis not present

## 2018-12-22 DIAGNOSIS — E440 Moderate protein-calorie malnutrition: Secondary | ICD-10-CM | POA: Diagnosis not present

## 2018-12-22 DIAGNOSIS — Z992 Dependence on renal dialysis: Secondary | ICD-10-CM | POA: Diagnosis not present

## 2018-12-22 DIAGNOSIS — Z79899 Other long term (current) drug therapy: Secondary | ICD-10-CM | POA: Diagnosis not present

## 2018-12-22 DIAGNOSIS — K769 Liver disease, unspecified: Secondary | ICD-10-CM | POA: Diagnosis not present

## 2018-12-23 DIAGNOSIS — Z992 Dependence on renal dialysis: Secondary | ICD-10-CM | POA: Diagnosis not present

## 2018-12-23 DIAGNOSIS — E440 Moderate protein-calorie malnutrition: Secondary | ICD-10-CM | POA: Diagnosis not present

## 2018-12-23 DIAGNOSIS — K769 Liver disease, unspecified: Secondary | ICD-10-CM | POA: Diagnosis not present

## 2018-12-23 DIAGNOSIS — N186 End stage renal disease: Secondary | ICD-10-CM | POA: Diagnosis not present

## 2018-12-23 DIAGNOSIS — Z79899 Other long term (current) drug therapy: Secondary | ICD-10-CM | POA: Diagnosis not present

## 2018-12-23 DIAGNOSIS — N2581 Secondary hyperparathyroidism of renal origin: Secondary | ICD-10-CM | POA: Diagnosis not present

## 2018-12-24 DIAGNOSIS — K769 Liver disease, unspecified: Secondary | ICD-10-CM | POA: Diagnosis not present

## 2018-12-24 DIAGNOSIS — N186 End stage renal disease: Secondary | ICD-10-CM | POA: Diagnosis not present

## 2018-12-24 DIAGNOSIS — Z992 Dependence on renal dialysis: Secondary | ICD-10-CM | POA: Diagnosis not present

## 2018-12-24 DIAGNOSIS — E440 Moderate protein-calorie malnutrition: Secondary | ICD-10-CM | POA: Diagnosis not present

## 2018-12-24 DIAGNOSIS — N2581 Secondary hyperparathyroidism of renal origin: Secondary | ICD-10-CM | POA: Diagnosis not present

## 2018-12-24 DIAGNOSIS — Z79899 Other long term (current) drug therapy: Secondary | ICD-10-CM | POA: Diagnosis not present

## 2018-12-25 DIAGNOSIS — E440 Moderate protein-calorie malnutrition: Secondary | ICD-10-CM | POA: Diagnosis not present

## 2018-12-25 DIAGNOSIS — N2581 Secondary hyperparathyroidism of renal origin: Secondary | ICD-10-CM | POA: Diagnosis not present

## 2018-12-25 DIAGNOSIS — Z992 Dependence on renal dialysis: Secondary | ICD-10-CM | POA: Diagnosis not present

## 2018-12-25 DIAGNOSIS — N186 End stage renal disease: Secondary | ICD-10-CM | POA: Diagnosis not present

## 2018-12-25 DIAGNOSIS — K769 Liver disease, unspecified: Secondary | ICD-10-CM | POA: Diagnosis not present

## 2018-12-25 DIAGNOSIS — Z79899 Other long term (current) drug therapy: Secondary | ICD-10-CM | POA: Diagnosis not present

## 2018-12-26 DIAGNOSIS — N186 End stage renal disease: Secondary | ICD-10-CM | POA: Diagnosis not present

## 2018-12-26 DIAGNOSIS — K769 Liver disease, unspecified: Secondary | ICD-10-CM | POA: Diagnosis not present

## 2018-12-26 DIAGNOSIS — N2581 Secondary hyperparathyroidism of renal origin: Secondary | ICD-10-CM | POA: Diagnosis not present

## 2018-12-26 DIAGNOSIS — Z79899 Other long term (current) drug therapy: Secondary | ICD-10-CM | POA: Diagnosis not present

## 2018-12-26 DIAGNOSIS — Z992 Dependence on renal dialysis: Secondary | ICD-10-CM | POA: Diagnosis not present

## 2018-12-26 DIAGNOSIS — E440 Moderate protein-calorie malnutrition: Secondary | ICD-10-CM | POA: Diagnosis not present

## 2018-12-27 DIAGNOSIS — Z79899 Other long term (current) drug therapy: Secondary | ICD-10-CM | POA: Diagnosis not present

## 2018-12-27 DIAGNOSIS — K769 Liver disease, unspecified: Secondary | ICD-10-CM | POA: Diagnosis not present

## 2018-12-27 DIAGNOSIS — N186 End stage renal disease: Secondary | ICD-10-CM | POA: Diagnosis not present

## 2018-12-27 DIAGNOSIS — Z992 Dependence on renal dialysis: Secondary | ICD-10-CM | POA: Diagnosis not present

## 2018-12-27 DIAGNOSIS — N2581 Secondary hyperparathyroidism of renal origin: Secondary | ICD-10-CM | POA: Diagnosis not present

## 2018-12-27 DIAGNOSIS — E440 Moderate protein-calorie malnutrition: Secondary | ICD-10-CM | POA: Diagnosis not present

## 2018-12-28 DIAGNOSIS — K769 Liver disease, unspecified: Secondary | ICD-10-CM | POA: Diagnosis not present

## 2018-12-28 DIAGNOSIS — Z992 Dependence on renal dialysis: Secondary | ICD-10-CM | POA: Diagnosis not present

## 2018-12-28 DIAGNOSIS — N186 End stage renal disease: Secondary | ICD-10-CM | POA: Diagnosis not present

## 2018-12-28 DIAGNOSIS — N2581 Secondary hyperparathyroidism of renal origin: Secondary | ICD-10-CM | POA: Diagnosis not present

## 2018-12-28 DIAGNOSIS — Z79899 Other long term (current) drug therapy: Secondary | ICD-10-CM | POA: Diagnosis not present

## 2018-12-28 DIAGNOSIS — E440 Moderate protein-calorie malnutrition: Secondary | ICD-10-CM | POA: Diagnosis not present

## 2018-12-29 DIAGNOSIS — Z79899 Other long term (current) drug therapy: Secondary | ICD-10-CM | POA: Diagnosis not present

## 2018-12-29 DIAGNOSIS — Z992 Dependence on renal dialysis: Secondary | ICD-10-CM | POA: Diagnosis not present

## 2018-12-29 DIAGNOSIS — K769 Liver disease, unspecified: Secondary | ICD-10-CM | POA: Diagnosis not present

## 2018-12-29 DIAGNOSIS — N2581 Secondary hyperparathyroidism of renal origin: Secondary | ICD-10-CM | POA: Diagnosis not present

## 2018-12-29 DIAGNOSIS — E440 Moderate protein-calorie malnutrition: Secondary | ICD-10-CM | POA: Diagnosis not present

## 2018-12-29 DIAGNOSIS — N186 End stage renal disease: Secondary | ICD-10-CM | POA: Diagnosis not present

## 2018-12-30 DIAGNOSIS — E440 Moderate protein-calorie malnutrition: Secondary | ICD-10-CM | POA: Diagnosis not present

## 2018-12-30 DIAGNOSIS — K769 Liver disease, unspecified: Secondary | ICD-10-CM | POA: Diagnosis not present

## 2018-12-30 DIAGNOSIS — Z79899 Other long term (current) drug therapy: Secondary | ICD-10-CM | POA: Diagnosis not present

## 2018-12-30 DIAGNOSIS — N2581 Secondary hyperparathyroidism of renal origin: Secondary | ICD-10-CM | POA: Diagnosis not present

## 2018-12-30 DIAGNOSIS — N186 End stage renal disease: Secondary | ICD-10-CM | POA: Diagnosis not present

## 2018-12-30 DIAGNOSIS — Z992 Dependence on renal dialysis: Secondary | ICD-10-CM | POA: Diagnosis not present

## 2018-12-31 DIAGNOSIS — Z79899 Other long term (current) drug therapy: Secondary | ICD-10-CM | POA: Diagnosis not present

## 2018-12-31 DIAGNOSIS — E440 Moderate protein-calorie malnutrition: Secondary | ICD-10-CM | POA: Diagnosis not present

## 2018-12-31 DIAGNOSIS — N186 End stage renal disease: Secondary | ICD-10-CM | POA: Diagnosis not present

## 2018-12-31 DIAGNOSIS — K769 Liver disease, unspecified: Secondary | ICD-10-CM | POA: Diagnosis not present

## 2018-12-31 DIAGNOSIS — Z992 Dependence on renal dialysis: Secondary | ICD-10-CM | POA: Diagnosis not present

## 2018-12-31 DIAGNOSIS — N2581 Secondary hyperparathyroidism of renal origin: Secondary | ICD-10-CM | POA: Diagnosis not present

## 2019-01-01 DIAGNOSIS — Z79899 Other long term (current) drug therapy: Secondary | ICD-10-CM | POA: Diagnosis not present

## 2019-01-01 DIAGNOSIS — E440 Moderate protein-calorie malnutrition: Secondary | ICD-10-CM | POA: Diagnosis not present

## 2019-01-01 DIAGNOSIS — K769 Liver disease, unspecified: Secondary | ICD-10-CM | POA: Diagnosis not present

## 2019-01-01 DIAGNOSIS — N186 End stage renal disease: Secondary | ICD-10-CM | POA: Diagnosis not present

## 2019-01-01 DIAGNOSIS — N2581 Secondary hyperparathyroidism of renal origin: Secondary | ICD-10-CM | POA: Diagnosis not present

## 2019-01-01 DIAGNOSIS — Z992 Dependence on renal dialysis: Secondary | ICD-10-CM | POA: Diagnosis not present

## 2019-01-02 DIAGNOSIS — Z992 Dependence on renal dialysis: Secondary | ICD-10-CM | POA: Diagnosis not present

## 2019-01-02 DIAGNOSIS — Z79899 Other long term (current) drug therapy: Secondary | ICD-10-CM | POA: Diagnosis not present

## 2019-01-02 DIAGNOSIS — K769 Liver disease, unspecified: Secondary | ICD-10-CM | POA: Diagnosis not present

## 2019-01-02 DIAGNOSIS — N186 End stage renal disease: Secondary | ICD-10-CM | POA: Diagnosis not present

## 2019-01-02 DIAGNOSIS — E440 Moderate protein-calorie malnutrition: Secondary | ICD-10-CM | POA: Diagnosis not present

## 2019-01-02 DIAGNOSIS — N2581 Secondary hyperparathyroidism of renal origin: Secondary | ICD-10-CM | POA: Diagnosis not present

## 2019-01-03 DIAGNOSIS — Z992 Dependence on renal dialysis: Secondary | ICD-10-CM | POA: Diagnosis not present

## 2019-01-03 DIAGNOSIS — N186 End stage renal disease: Secondary | ICD-10-CM | POA: Diagnosis not present

## 2019-01-03 DIAGNOSIS — Z79899 Other long term (current) drug therapy: Secondary | ICD-10-CM | POA: Diagnosis not present

## 2019-01-03 DIAGNOSIS — K769 Liver disease, unspecified: Secondary | ICD-10-CM | POA: Diagnosis not present

## 2019-01-03 DIAGNOSIS — E440 Moderate protein-calorie malnutrition: Secondary | ICD-10-CM | POA: Diagnosis not present

## 2019-01-03 DIAGNOSIS — N2581 Secondary hyperparathyroidism of renal origin: Secondary | ICD-10-CM | POA: Diagnosis not present

## 2019-01-04 DIAGNOSIS — N186 End stage renal disease: Secondary | ICD-10-CM | POA: Diagnosis not present

## 2019-01-04 DIAGNOSIS — K769 Liver disease, unspecified: Secondary | ICD-10-CM | POA: Diagnosis not present

## 2019-01-04 DIAGNOSIS — Z992 Dependence on renal dialysis: Secondary | ICD-10-CM | POA: Diagnosis not present

## 2019-01-04 DIAGNOSIS — Z79899 Other long term (current) drug therapy: Secondary | ICD-10-CM | POA: Diagnosis not present

## 2019-01-04 DIAGNOSIS — N2581 Secondary hyperparathyroidism of renal origin: Secondary | ICD-10-CM | POA: Diagnosis not present

## 2019-01-04 DIAGNOSIS — E440 Moderate protein-calorie malnutrition: Secondary | ICD-10-CM | POA: Diagnosis not present

## 2019-01-05 DIAGNOSIS — E440 Moderate protein-calorie malnutrition: Secondary | ICD-10-CM | POA: Diagnosis not present

## 2019-01-05 DIAGNOSIS — Z79899 Other long term (current) drug therapy: Secondary | ICD-10-CM | POA: Diagnosis not present

## 2019-01-05 DIAGNOSIS — N186 End stage renal disease: Secondary | ICD-10-CM | POA: Diagnosis not present

## 2019-01-05 DIAGNOSIS — Z992 Dependence on renal dialysis: Secondary | ICD-10-CM | POA: Diagnosis not present

## 2019-01-05 DIAGNOSIS — N2581 Secondary hyperparathyroidism of renal origin: Secondary | ICD-10-CM | POA: Diagnosis not present

## 2019-01-05 DIAGNOSIS — K769 Liver disease, unspecified: Secondary | ICD-10-CM | POA: Diagnosis not present

## 2019-01-06 DIAGNOSIS — Z79899 Other long term (current) drug therapy: Secondary | ICD-10-CM | POA: Diagnosis not present

## 2019-01-06 DIAGNOSIS — Z23 Encounter for immunization: Secondary | ICD-10-CM | POA: Diagnosis not present

## 2019-01-06 DIAGNOSIS — D509 Iron deficiency anemia, unspecified: Secondary | ICD-10-CM | POA: Diagnosis not present

## 2019-01-06 DIAGNOSIS — E440 Moderate protein-calorie malnutrition: Secondary | ICD-10-CM | POA: Diagnosis not present

## 2019-01-06 DIAGNOSIS — Z992 Dependence on renal dialysis: Secondary | ICD-10-CM | POA: Diagnosis not present

## 2019-01-06 DIAGNOSIS — E7841 Elevated Lipoprotein(a): Secondary | ICD-10-CM | POA: Diagnosis not present

## 2019-01-06 DIAGNOSIS — N186 End stage renal disease: Secondary | ICD-10-CM | POA: Diagnosis not present

## 2019-01-06 DIAGNOSIS — I129 Hypertensive chronic kidney disease with stage 1 through stage 4 chronic kidney disease, or unspecified chronic kidney disease: Secondary | ICD-10-CM | POA: Diagnosis not present

## 2019-01-06 DIAGNOSIS — N2581 Secondary hyperparathyroidism of renal origin: Secondary | ICD-10-CM | POA: Diagnosis not present

## 2019-01-06 DIAGNOSIS — D631 Anemia in chronic kidney disease: Secondary | ICD-10-CM | POA: Diagnosis not present

## 2019-01-06 DIAGNOSIS — N2589 Other disorders resulting from impaired renal tubular function: Secondary | ICD-10-CM | POA: Diagnosis not present

## 2019-01-07 DIAGNOSIS — Z992 Dependence on renal dialysis: Secondary | ICD-10-CM | POA: Diagnosis not present

## 2019-01-07 DIAGNOSIS — D631 Anemia in chronic kidney disease: Secondary | ICD-10-CM | POA: Diagnosis not present

## 2019-01-07 DIAGNOSIS — D509 Iron deficiency anemia, unspecified: Secondary | ICD-10-CM | POA: Diagnosis not present

## 2019-01-07 DIAGNOSIS — E440 Moderate protein-calorie malnutrition: Secondary | ICD-10-CM | POA: Diagnosis not present

## 2019-01-07 DIAGNOSIS — N186 End stage renal disease: Secondary | ICD-10-CM | POA: Diagnosis not present

## 2019-01-07 DIAGNOSIS — N2581 Secondary hyperparathyroidism of renal origin: Secondary | ICD-10-CM | POA: Diagnosis not present

## 2019-01-08 DIAGNOSIS — N186 End stage renal disease: Secondary | ICD-10-CM | POA: Diagnosis not present

## 2019-01-08 DIAGNOSIS — Z992 Dependence on renal dialysis: Secondary | ICD-10-CM | POA: Diagnosis not present

## 2019-01-08 DIAGNOSIS — D631 Anemia in chronic kidney disease: Secondary | ICD-10-CM | POA: Diagnosis not present

## 2019-01-08 DIAGNOSIS — N2581 Secondary hyperparathyroidism of renal origin: Secondary | ICD-10-CM | POA: Diagnosis not present

## 2019-01-08 DIAGNOSIS — E440 Moderate protein-calorie malnutrition: Secondary | ICD-10-CM | POA: Diagnosis not present

## 2019-01-08 DIAGNOSIS — D509 Iron deficiency anemia, unspecified: Secondary | ICD-10-CM | POA: Diagnosis not present

## 2019-01-09 DIAGNOSIS — N2581 Secondary hyperparathyroidism of renal origin: Secondary | ICD-10-CM | POA: Diagnosis not present

## 2019-01-09 DIAGNOSIS — Z992 Dependence on renal dialysis: Secondary | ICD-10-CM | POA: Diagnosis not present

## 2019-01-09 DIAGNOSIS — D631 Anemia in chronic kidney disease: Secondary | ICD-10-CM | POA: Diagnosis not present

## 2019-01-09 DIAGNOSIS — N186 End stage renal disease: Secondary | ICD-10-CM | POA: Diagnosis not present

## 2019-01-09 DIAGNOSIS — E440 Moderate protein-calorie malnutrition: Secondary | ICD-10-CM | POA: Diagnosis not present

## 2019-01-09 DIAGNOSIS — D509 Iron deficiency anemia, unspecified: Secondary | ICD-10-CM | POA: Diagnosis not present

## 2019-01-10 DIAGNOSIS — D509 Iron deficiency anemia, unspecified: Secondary | ICD-10-CM | POA: Diagnosis not present

## 2019-01-10 DIAGNOSIS — N186 End stage renal disease: Secondary | ICD-10-CM | POA: Diagnosis not present

## 2019-01-10 DIAGNOSIS — Z992 Dependence on renal dialysis: Secondary | ICD-10-CM | POA: Diagnosis not present

## 2019-01-10 DIAGNOSIS — D631 Anemia in chronic kidney disease: Secondary | ICD-10-CM | POA: Diagnosis not present

## 2019-01-10 DIAGNOSIS — N2581 Secondary hyperparathyroidism of renal origin: Secondary | ICD-10-CM | POA: Diagnosis not present

## 2019-01-10 DIAGNOSIS — E440 Moderate protein-calorie malnutrition: Secondary | ICD-10-CM | POA: Diagnosis not present

## 2019-01-11 ENCOUNTER — Emergency Department (HOSPITAL_COMMUNITY)
Admission: EM | Admit: 2019-01-11 | Discharge: 2019-01-11 | Discharge status: Against Medical Advice | Payer: Medicare Other | Attending: Emergency Medicine | Admitting: Emergency Medicine

## 2019-01-11 DIAGNOSIS — E440 Moderate protein-calorie malnutrition: Secondary | ICD-10-CM | POA: Diagnosis not present

## 2019-01-11 DIAGNOSIS — Z5321 Procedure and treatment not carried out due to patient leaving prior to being seen by health care provider: Secondary | ICD-10-CM | POA: Diagnosis not present

## 2019-01-11 DIAGNOSIS — Z992 Dependence on renal dialysis: Secondary | ICD-10-CM | POA: Diagnosis not present

## 2019-01-11 DIAGNOSIS — N2581 Secondary hyperparathyroidism of renal origin: Secondary | ICD-10-CM | POA: Diagnosis not present

## 2019-01-11 DIAGNOSIS — D631 Anemia in chronic kidney disease: Secondary | ICD-10-CM | POA: Diagnosis not present

## 2019-01-11 DIAGNOSIS — D509 Iron deficiency anemia, unspecified: Secondary | ICD-10-CM | POA: Diagnosis not present

## 2019-01-11 DIAGNOSIS — N186 End stage renal disease: Secondary | ICD-10-CM | POA: Diagnosis not present

## 2019-01-11 DIAGNOSIS — R252 Cramp and spasm: Secondary | ICD-10-CM | POA: Diagnosis present

## 2019-01-11 NOTE — ED Triage Notes (Signed)
Pt reports generalized muscle cramps since HD treatment today. States he thinks they took too much fluid off and this has happened before.

## 2019-01-11 NOTE — ED Notes (Signed)
Pt states he feels better and does not want to be seen. Has PCP appt tomorrow

## 2019-01-12 DIAGNOSIS — N2581 Secondary hyperparathyroidism of renal origin: Secondary | ICD-10-CM | POA: Diagnosis not present

## 2019-01-12 DIAGNOSIS — D631 Anemia in chronic kidney disease: Secondary | ICD-10-CM | POA: Diagnosis not present

## 2019-01-12 DIAGNOSIS — N186 End stage renal disease: Secondary | ICD-10-CM | POA: Diagnosis not present

## 2019-01-12 DIAGNOSIS — D509 Iron deficiency anemia, unspecified: Secondary | ICD-10-CM | POA: Diagnosis not present

## 2019-01-12 DIAGNOSIS — Z992 Dependence on renal dialysis: Secondary | ICD-10-CM | POA: Diagnosis not present

## 2019-01-12 DIAGNOSIS — E440 Moderate protein-calorie malnutrition: Secondary | ICD-10-CM | POA: Diagnosis not present

## 2019-01-13 DIAGNOSIS — N186 End stage renal disease: Secondary | ICD-10-CM | POA: Diagnosis not present

## 2019-01-13 DIAGNOSIS — E440 Moderate protein-calorie malnutrition: Secondary | ICD-10-CM | POA: Diagnosis not present

## 2019-01-13 DIAGNOSIS — D509 Iron deficiency anemia, unspecified: Secondary | ICD-10-CM | POA: Diagnosis not present

## 2019-01-13 DIAGNOSIS — E7841 Elevated Lipoprotein(a): Secondary | ICD-10-CM | POA: Diagnosis not present

## 2019-01-13 DIAGNOSIS — N2589 Other disorders resulting from impaired renal tubular function: Secondary | ICD-10-CM | POA: Diagnosis not present

## 2019-01-13 DIAGNOSIS — N2581 Secondary hyperparathyroidism of renal origin: Secondary | ICD-10-CM | POA: Diagnosis not present

## 2019-01-13 DIAGNOSIS — D631 Anemia in chronic kidney disease: Secondary | ICD-10-CM | POA: Diagnosis not present

## 2019-01-13 DIAGNOSIS — Z992 Dependence on renal dialysis: Secondary | ICD-10-CM | POA: Diagnosis not present

## 2019-01-14 DIAGNOSIS — N2581 Secondary hyperparathyroidism of renal origin: Secondary | ICD-10-CM | POA: Diagnosis not present

## 2019-01-14 DIAGNOSIS — D631 Anemia in chronic kidney disease: Secondary | ICD-10-CM | POA: Diagnosis not present

## 2019-01-14 DIAGNOSIS — N186 End stage renal disease: Secondary | ICD-10-CM | POA: Diagnosis not present

## 2019-01-14 DIAGNOSIS — E440 Moderate protein-calorie malnutrition: Secondary | ICD-10-CM | POA: Diagnosis not present

## 2019-01-14 DIAGNOSIS — Z992 Dependence on renal dialysis: Secondary | ICD-10-CM | POA: Diagnosis not present

## 2019-01-14 DIAGNOSIS — D509 Iron deficiency anemia, unspecified: Secondary | ICD-10-CM | POA: Diagnosis not present

## 2019-01-15 DIAGNOSIS — N2581 Secondary hyperparathyroidism of renal origin: Secondary | ICD-10-CM | POA: Diagnosis not present

## 2019-01-15 DIAGNOSIS — D509 Iron deficiency anemia, unspecified: Secondary | ICD-10-CM | POA: Diagnosis not present

## 2019-01-15 DIAGNOSIS — Z992 Dependence on renal dialysis: Secondary | ICD-10-CM | POA: Diagnosis not present

## 2019-01-15 DIAGNOSIS — D631 Anemia in chronic kidney disease: Secondary | ICD-10-CM | POA: Diagnosis not present

## 2019-01-15 DIAGNOSIS — N186 End stage renal disease: Secondary | ICD-10-CM | POA: Diagnosis not present

## 2019-01-15 DIAGNOSIS — E440 Moderate protein-calorie malnutrition: Secondary | ICD-10-CM | POA: Diagnosis not present

## 2019-01-16 DIAGNOSIS — Z992 Dependence on renal dialysis: Secondary | ICD-10-CM | POA: Diagnosis not present

## 2019-01-16 DIAGNOSIS — N2581 Secondary hyperparathyroidism of renal origin: Secondary | ICD-10-CM | POA: Diagnosis not present

## 2019-01-16 DIAGNOSIS — D631 Anemia in chronic kidney disease: Secondary | ICD-10-CM | POA: Diagnosis not present

## 2019-01-16 DIAGNOSIS — E440 Moderate protein-calorie malnutrition: Secondary | ICD-10-CM | POA: Diagnosis not present

## 2019-01-16 DIAGNOSIS — D509 Iron deficiency anemia, unspecified: Secondary | ICD-10-CM | POA: Diagnosis not present

## 2019-01-16 DIAGNOSIS — N186 End stage renal disease: Secondary | ICD-10-CM | POA: Diagnosis not present

## 2019-01-17 DIAGNOSIS — Z992 Dependence on renal dialysis: Secondary | ICD-10-CM | POA: Diagnosis not present

## 2019-01-17 DIAGNOSIS — N2581 Secondary hyperparathyroidism of renal origin: Secondary | ICD-10-CM | POA: Diagnosis not present

## 2019-01-17 DIAGNOSIS — E440 Moderate protein-calorie malnutrition: Secondary | ICD-10-CM | POA: Diagnosis not present

## 2019-01-17 DIAGNOSIS — D631 Anemia in chronic kidney disease: Secondary | ICD-10-CM | POA: Diagnosis not present

## 2019-01-17 DIAGNOSIS — N186 End stage renal disease: Secondary | ICD-10-CM | POA: Diagnosis not present

## 2019-01-17 DIAGNOSIS — D509 Iron deficiency anemia, unspecified: Secondary | ICD-10-CM | POA: Diagnosis not present

## 2019-01-18 DIAGNOSIS — N2581 Secondary hyperparathyroidism of renal origin: Secondary | ICD-10-CM | POA: Diagnosis not present

## 2019-01-18 DIAGNOSIS — N186 End stage renal disease: Secondary | ICD-10-CM | POA: Diagnosis not present

## 2019-01-18 DIAGNOSIS — Z992 Dependence on renal dialysis: Secondary | ICD-10-CM | POA: Diagnosis not present

## 2019-01-18 DIAGNOSIS — E440 Moderate protein-calorie malnutrition: Secondary | ICD-10-CM | POA: Diagnosis not present

## 2019-01-18 DIAGNOSIS — D631 Anemia in chronic kidney disease: Secondary | ICD-10-CM | POA: Diagnosis not present

## 2019-01-18 DIAGNOSIS — D509 Iron deficiency anemia, unspecified: Secondary | ICD-10-CM | POA: Diagnosis not present

## 2019-01-19 DIAGNOSIS — D509 Iron deficiency anemia, unspecified: Secondary | ICD-10-CM | POA: Diagnosis not present

## 2019-01-19 DIAGNOSIS — Z992 Dependence on renal dialysis: Secondary | ICD-10-CM | POA: Diagnosis not present

## 2019-01-19 DIAGNOSIS — N186 End stage renal disease: Secondary | ICD-10-CM | POA: Diagnosis not present

## 2019-01-19 DIAGNOSIS — E440 Moderate protein-calorie malnutrition: Secondary | ICD-10-CM | POA: Diagnosis not present

## 2019-01-19 DIAGNOSIS — D631 Anemia in chronic kidney disease: Secondary | ICD-10-CM | POA: Diagnosis not present

## 2019-01-19 DIAGNOSIS — N2581 Secondary hyperparathyroidism of renal origin: Secondary | ICD-10-CM | POA: Diagnosis not present

## 2019-01-20 DIAGNOSIS — D509 Iron deficiency anemia, unspecified: Secondary | ICD-10-CM | POA: Diagnosis not present

## 2019-01-20 DIAGNOSIS — Z992 Dependence on renal dialysis: Secondary | ICD-10-CM | POA: Diagnosis not present

## 2019-01-20 DIAGNOSIS — N2581 Secondary hyperparathyroidism of renal origin: Secondary | ICD-10-CM | POA: Diagnosis not present

## 2019-01-20 DIAGNOSIS — N186 End stage renal disease: Secondary | ICD-10-CM | POA: Diagnosis not present

## 2019-01-20 DIAGNOSIS — D631 Anemia in chronic kidney disease: Secondary | ICD-10-CM | POA: Diagnosis not present

## 2019-01-20 DIAGNOSIS — E440 Moderate protein-calorie malnutrition: Secondary | ICD-10-CM | POA: Diagnosis not present

## 2019-01-21 DIAGNOSIS — Z992 Dependence on renal dialysis: Secondary | ICD-10-CM | POA: Diagnosis not present

## 2019-01-21 DIAGNOSIS — E440 Moderate protein-calorie malnutrition: Secondary | ICD-10-CM | POA: Diagnosis not present

## 2019-01-21 DIAGNOSIS — N2581 Secondary hyperparathyroidism of renal origin: Secondary | ICD-10-CM | POA: Diagnosis not present

## 2019-01-21 DIAGNOSIS — D631 Anemia in chronic kidney disease: Secondary | ICD-10-CM | POA: Diagnosis not present

## 2019-01-21 DIAGNOSIS — N186 End stage renal disease: Secondary | ICD-10-CM | POA: Diagnosis not present

## 2019-01-21 DIAGNOSIS — D509 Iron deficiency anemia, unspecified: Secondary | ICD-10-CM | POA: Diagnosis not present

## 2019-01-22 DIAGNOSIS — E440 Moderate protein-calorie malnutrition: Secondary | ICD-10-CM | POA: Diagnosis not present

## 2019-01-22 DIAGNOSIS — N2581 Secondary hyperparathyroidism of renal origin: Secondary | ICD-10-CM | POA: Diagnosis not present

## 2019-01-22 DIAGNOSIS — D631 Anemia in chronic kidney disease: Secondary | ICD-10-CM | POA: Diagnosis not present

## 2019-01-22 DIAGNOSIS — Z992 Dependence on renal dialysis: Secondary | ICD-10-CM | POA: Diagnosis not present

## 2019-01-22 DIAGNOSIS — N186 End stage renal disease: Secondary | ICD-10-CM | POA: Diagnosis not present

## 2019-01-22 DIAGNOSIS — D509 Iron deficiency anemia, unspecified: Secondary | ICD-10-CM | POA: Diagnosis not present

## 2019-01-23 DIAGNOSIS — N186 End stage renal disease: Secondary | ICD-10-CM | POA: Diagnosis not present

## 2019-01-23 DIAGNOSIS — D509 Iron deficiency anemia, unspecified: Secondary | ICD-10-CM | POA: Diagnosis not present

## 2019-01-23 DIAGNOSIS — Z992 Dependence on renal dialysis: Secondary | ICD-10-CM | POA: Diagnosis not present

## 2019-01-23 DIAGNOSIS — D631 Anemia in chronic kidney disease: Secondary | ICD-10-CM | POA: Diagnosis not present

## 2019-01-23 DIAGNOSIS — E440 Moderate protein-calorie malnutrition: Secondary | ICD-10-CM | POA: Diagnosis not present

## 2019-01-23 DIAGNOSIS — N2581 Secondary hyperparathyroidism of renal origin: Secondary | ICD-10-CM | POA: Diagnosis not present

## 2019-01-24 DIAGNOSIS — Z992 Dependence on renal dialysis: Secondary | ICD-10-CM | POA: Diagnosis not present

## 2019-01-24 DIAGNOSIS — N2581 Secondary hyperparathyroidism of renal origin: Secondary | ICD-10-CM | POA: Diagnosis not present

## 2019-01-24 DIAGNOSIS — E440 Moderate protein-calorie malnutrition: Secondary | ICD-10-CM | POA: Diagnosis not present

## 2019-01-24 DIAGNOSIS — N186 End stage renal disease: Secondary | ICD-10-CM | POA: Diagnosis not present

## 2019-01-24 DIAGNOSIS — D631 Anemia in chronic kidney disease: Secondary | ICD-10-CM | POA: Diagnosis not present

## 2019-01-24 DIAGNOSIS — D509 Iron deficiency anemia, unspecified: Secondary | ICD-10-CM | POA: Diagnosis not present

## 2019-01-25 DIAGNOSIS — N186 End stage renal disease: Secondary | ICD-10-CM | POA: Diagnosis not present

## 2019-01-25 DIAGNOSIS — E440 Moderate protein-calorie malnutrition: Secondary | ICD-10-CM | POA: Diagnosis not present

## 2019-01-25 DIAGNOSIS — D631 Anemia in chronic kidney disease: Secondary | ICD-10-CM | POA: Diagnosis not present

## 2019-01-25 DIAGNOSIS — D509 Iron deficiency anemia, unspecified: Secondary | ICD-10-CM | POA: Diagnosis not present

## 2019-01-25 DIAGNOSIS — Z992 Dependence on renal dialysis: Secondary | ICD-10-CM | POA: Diagnosis not present

## 2019-01-25 DIAGNOSIS — N2581 Secondary hyperparathyroidism of renal origin: Secondary | ICD-10-CM | POA: Diagnosis not present

## 2019-01-26 DIAGNOSIS — Z992 Dependence on renal dialysis: Secondary | ICD-10-CM | POA: Diagnosis not present

## 2019-01-26 DIAGNOSIS — D509 Iron deficiency anemia, unspecified: Secondary | ICD-10-CM | POA: Diagnosis not present

## 2019-01-26 DIAGNOSIS — N186 End stage renal disease: Secondary | ICD-10-CM | POA: Diagnosis not present

## 2019-01-26 DIAGNOSIS — D631 Anemia in chronic kidney disease: Secondary | ICD-10-CM | POA: Diagnosis not present

## 2019-01-26 DIAGNOSIS — E440 Moderate protein-calorie malnutrition: Secondary | ICD-10-CM | POA: Diagnosis not present

## 2019-01-26 DIAGNOSIS — N2581 Secondary hyperparathyroidism of renal origin: Secondary | ICD-10-CM | POA: Diagnosis not present

## 2019-01-27 DIAGNOSIS — N2581 Secondary hyperparathyroidism of renal origin: Secondary | ICD-10-CM | POA: Diagnosis not present

## 2019-01-27 DIAGNOSIS — Z992 Dependence on renal dialysis: Secondary | ICD-10-CM | POA: Diagnosis not present

## 2019-01-27 DIAGNOSIS — D509 Iron deficiency anemia, unspecified: Secondary | ICD-10-CM | POA: Diagnosis not present

## 2019-01-27 DIAGNOSIS — N186 End stage renal disease: Secondary | ICD-10-CM | POA: Diagnosis not present

## 2019-01-27 DIAGNOSIS — D631 Anemia in chronic kidney disease: Secondary | ICD-10-CM | POA: Diagnosis not present

## 2019-01-27 DIAGNOSIS — E440 Moderate protein-calorie malnutrition: Secondary | ICD-10-CM | POA: Diagnosis not present

## 2019-01-28 DIAGNOSIS — N186 End stage renal disease: Secondary | ICD-10-CM | POA: Diagnosis not present

## 2019-01-28 DIAGNOSIS — E440 Moderate protein-calorie malnutrition: Secondary | ICD-10-CM | POA: Diagnosis not present

## 2019-01-28 DIAGNOSIS — D631 Anemia in chronic kidney disease: Secondary | ICD-10-CM | POA: Diagnosis not present

## 2019-01-28 DIAGNOSIS — D509 Iron deficiency anemia, unspecified: Secondary | ICD-10-CM | POA: Diagnosis not present

## 2019-01-28 DIAGNOSIS — N2581 Secondary hyperparathyroidism of renal origin: Secondary | ICD-10-CM | POA: Diagnosis not present

## 2019-01-28 DIAGNOSIS — Z992 Dependence on renal dialysis: Secondary | ICD-10-CM | POA: Diagnosis not present

## 2019-01-29 ENCOUNTER — Other Ambulatory Visit: Payer: Self-pay

## 2019-01-29 ENCOUNTER — Encounter (HOSPITAL_COMMUNITY): Payer: Self-pay | Admitting: Emergency Medicine

## 2019-01-29 ENCOUNTER — Ambulatory Visit (HOSPITAL_COMMUNITY)
Admission: EM | Admit: 2019-01-29 | Discharge: 2019-01-29 | Disposition: A | Discharge status: Home / Self Care | Payer: Medicare Other | Attending: Internal Medicine | Admitting: Internal Medicine

## 2019-01-29 DIAGNOSIS — I120 Hypertensive chronic kidney disease with stage 5 chronic kidney disease or end stage renal disease: Secondary | ICD-10-CM | POA: Insufficient documentation

## 2019-01-29 DIAGNOSIS — R519 Headache, unspecified: Secondary | ICD-10-CM

## 2019-01-29 DIAGNOSIS — Z992 Dependence on renal dialysis: Secondary | ICD-10-CM | POA: Diagnosis not present

## 2019-01-29 DIAGNOSIS — F17210 Nicotine dependence, cigarettes, uncomplicated: Secondary | ICD-10-CM | POA: Diagnosis not present

## 2019-01-29 DIAGNOSIS — D509 Iron deficiency anemia, unspecified: Secondary | ICD-10-CM | POA: Diagnosis not present

## 2019-01-29 DIAGNOSIS — E440 Moderate protein-calorie malnutrition: Secondary | ICD-10-CM | POA: Diagnosis not present

## 2019-01-29 DIAGNOSIS — Z885 Allergy status to narcotic agent status: Secondary | ICD-10-CM | POA: Diagnosis not present

## 2019-01-29 DIAGNOSIS — N2581 Secondary hyperparathyroidism of renal origin: Secondary | ICD-10-CM | POA: Diagnosis not present

## 2019-01-29 DIAGNOSIS — R1084 Generalized abdominal pain: Secondary | ICD-10-CM | POA: Diagnosis not present

## 2019-01-29 DIAGNOSIS — Z79899 Other long term (current) drug therapy: Secondary | ICD-10-CM | POA: Insufficient documentation

## 2019-01-29 DIAGNOSIS — Z20828 Contact with and (suspected) exposure to other viral communicable diseases: Secondary | ICD-10-CM | POA: Insufficient documentation

## 2019-01-29 DIAGNOSIS — D631 Anemia in chronic kidney disease: Secondary | ICD-10-CM | POA: Diagnosis not present

## 2019-01-29 DIAGNOSIS — N186 End stage renal disease: Secondary | ICD-10-CM | POA: Diagnosis not present

## 2019-01-29 HISTORY — DX: Dependence on renal dialysis: Z99.2

## 2019-01-29 NOTE — ED Triage Notes (Signed)
Headache and abdominal pain started last night.    Seen by Barkley Boards, NP prior to this nurse

## 2019-01-29 NOTE — ED Provider Notes (Signed)
[facility]    CSN: [id number] Arrival date & time: 01/29/19  1408      History   Chief Complaint Chief Complaint  Patient presents with  . Abdominal Pain    HPI Cory Dixon is a 33 y.o. male.   Patient presents with headache and abdominal pain x1 day.  He denies fever, chills, vomiting, diarrhea, or other symptoms.  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  No treatments attempted at home.  Medical history is significant for ESRD on dialysis, A-feb, hypertension, Migraines.    The history is provided by the patient.    Past Medical History:  Diagnosis Date  . Chicken pox   . CKD (chronic kidney disease), stage V ([place])   . Headache(784.0)   . Hyperkalemia 03/2017  . Hypertension   . Migraine   . Peritoneal dialysis status [facility])     Patient Active Problem List   Diagnosis Date Noted  . Smoking 04/02/2017  . History of atrial fibrillation 04/02/2017  . ESRD (end stage renal disease) ([place]) 03/20/2017  . Acute hyperkalemia 03/19/2017  . CKD (chronic kidney disease) stage V requiring chronic dialysis ([place]) 03/19/2017  . HTN (hypertension) 03/19/2017  . Abnormal EKG 03/19/2017  . Hypertension 12/30/2010  . Migraine headache 12/30/2010    Past Surgical History:  Procedure Laterality Date  . DIALYSIS FISTULA CREATION         Home Medications    Prior to Admission medications   Medication Sig Start Date End Date Taking? Authorizing Provider  cinacalcet (SENSIPAR) 30 MG tablet Take 2 tablets (60 mg total) by mouth daily with supper. 03/20/17   Shahmehdi, Valeria Batman, MD  escitalopram (LEXAPRO) 5 MG tablet Take 5 mg by mouth at bedtime.    [provider]  lisinopril (PRINIVIL,ZESTRIL) 10 MG tablet Take 10 mg by mouth daily.    [provider]  pramoxine (SARNA SENSITIVE) 1 % LOTN Apply 1 application topically 2 (two) times daily. 10/24/17   Rancour, Annie Main, MD  sucroferric oxyhydroxide (VELPHORO) 500 MG  chewable tablet Chew 1,000 mg by mouth 3 (three) times daily.    [provider]    Family History Family History  Problem Relation Age of Onset  . Arthritis Mother   . Arthritis Maternal Grandmother   . Cancer Maternal Grandmother        breast  . Hypertension Maternal Grandmother   . Depression Maternal Grandmother   . Hypertension Maternal Grandfather   . Hypertension Paternal Grandmother   . Depression Paternal Grandmother   . Hypertension Paternal Grandfather     Social History Social History   Tobacco Use  . Smoking status: Current Every Day Smoker    Packs/day: 0.20    Years: 8.00    Pack years: 1.60    Types: Cigarettes  . Smokeless tobacco: Never Used  Substance Use Topics  . Alcohol use: No    Frequency: Never  . Drug use: No     Allergies   Nicotine and Percocet [oxycodone-acetaminophen]   Review of Systems Review of Systems  Constitutional: Negative for chills and fever.  HENT: Negative for ear pain and sore throat.   Eyes: Negative for pain and visual disturbance.  Respiratory: Negative for cough and shortness of breath.   Cardiovascular: Negative for chest pain and palpitations.  Gastrointestinal: Positive for abdominal pain. Negative for constipation, diarrhea, nausea and vomiting.  Genitourinary: Negative for dysuria and hematuria.  Musculoskeletal: Negative for arthralgias  and back pain.  Skin: Negative for color change and rash.  Neurological: Positive for headaches. Negative for dizziness, seizures, syncope, facial asymmetry, speech difficulty, weakness and numbness.  All other systems reviewed and are negative.    Physical Exam Triage Vital Signs ED Triage Vitals  Enc Vitals Group     BP      Pulse      Resp      Temp      Temp src      SpO2      Weight      Height      Head Circumference      Peak Flow      Pain Score      Pain Loc      Pain Edu?      Excl. in [place]?    No data found.  Updated Vital Signs BP  128/80 (BP Location: Right Arm)   Pulse 68   Temp 98.6 F (37 C) (Oral)   Resp 18   SpO2 100%   Visual Acuity Right Eye Distance:   Left Eye Distance:   Bilateral Distance:    Right Eye Near:   Left Eye Near:    Bilateral Near:     Physical Exam Vitals signs and nursing note reviewed.  Constitutional:      General: He is not in acute distress.    Appearance: He is well-developed. He is not ill-appearing.  HENT:     Head: Normocephalic and atraumatic.     Right Ear: Tympanic membrane normal.     Left Ear: Tympanic membrane normal.     Nose: Nose normal.     Mouth/Throat:     Mouth: Mucous membranes are moist.     Pharynx: Oropharynx is clear.  Eyes:     Conjunctiva/sclera: Conjunctivae normal.  Neck:     Musculoskeletal: Neck supple.  Cardiovascular:     Rate and Rhythm: Normal rate and regular rhythm.     Heart sounds: No murmur.  Pulmonary:     Effort: Pulmonary effort is normal. No respiratory distress.     Breath sounds: Normal breath sounds.  Abdominal:     General: Bowel sounds are normal.     Palpations: Abdomen is soft.     Tenderness: There is no abdominal tenderness. There is no right CVA tenderness, left CVA tenderness, guarding or rebound.  Skin:    General: Skin is warm and dry.     Findings: No rash.  Neurological:     General: No focal deficit present.     Mental Status: He is alert and oriented to person, place, and time.     Sensory: No sensory deficit.     Motor: No weakness.     Gait: Gait normal.      UC Treatments / Results  Labs (all labs ordered are listed, but only abnormal results are displayed) Labs Reviewed  NOVEL CORONAVIRUS, NAA (HOSP ORDER, SEND-OUT TO REF LAB; TAT 18-24 HRS)    EKG   Radiology No results found.  Procedures Procedures (including critical care time)  Medications Ordered in UC Medications - No data to display  Initial Impression / Assessment and Plan / UC Course  I have reviewed the triage vital  signs and the nursing notes.  Pertinent labs & imaging results that were available during my care of the patient were reviewed by me and considered in my medical decision making (see chart for details).    Generalized abdominal pain,  acute non-intractable headache.  Patient is well-appearing and his exam is unremarkable.  COVID test performed here.  Instructed patient to self quarantine until the test result is back.  Instructed patient to go to the emergency department if develops acute worsening symptoms, including abdominal pain or headache; or if he develops high fever, shortness of breath, severe diarrhea, or other concerning symptoms.  Patient agrees with plan of care.     Final Clinical Impressions(s) / UC Diagnoses   Final diagnoses:  Generalized abdominal pain  Acute nonintractable headache, unspecified headache type     Discharge Instructions     Go to the emergency department if you have acute worsening symptoms including abdominal pain or headache.    Your COVID test is pending.  You should self quarantine until your test result is back and is negative.  Go to the emergency department if you develop high fever, shortness of breath, severe diarrhea, or other concerning symptoms.       ED Prescriptions    None     PDMP not reviewed this encounter.   Sharion Balloon, NP 01/29/19 [phone]

## 2019-01-29 NOTE — ED Notes (Signed)
Nasal specimen to lab

## 2019-01-29 NOTE — Discharge Instructions (Addendum)
Go to the emergency department if you have acute worsening symptoms including abdominal pain or headache.    Your COVID test is pending.  You should self quarantine until your test result is back and is negative.  Go to the emergency department if you develop high fever, shortness of breath, severe diarrhea, or other concerning symptoms.

## 2019-01-30 DIAGNOSIS — D509 Iron deficiency anemia, unspecified: Secondary | ICD-10-CM | POA: Diagnosis not present

## 2019-01-30 DIAGNOSIS — N186 End stage renal disease: Secondary | ICD-10-CM | POA: Diagnosis not present

## 2019-01-30 DIAGNOSIS — N2581 Secondary hyperparathyroidism of renal origin: Secondary | ICD-10-CM | POA: Diagnosis not present

## 2019-01-30 DIAGNOSIS — D631 Anemia in chronic kidney disease: Secondary | ICD-10-CM | POA: Diagnosis not present

## 2019-01-30 DIAGNOSIS — Z992 Dependence on renal dialysis: Secondary | ICD-10-CM | POA: Diagnosis not present

## 2019-01-30 DIAGNOSIS — E440 Moderate protein-calorie malnutrition: Secondary | ICD-10-CM | POA: Diagnosis not present

## 2019-01-31 DIAGNOSIS — Z992 Dependence on renal dialysis: Secondary | ICD-10-CM | POA: Diagnosis not present

## 2019-01-31 DIAGNOSIS — D631 Anemia in chronic kidney disease: Secondary | ICD-10-CM | POA: Diagnosis not present

## 2019-01-31 DIAGNOSIS — N186 End stage renal disease: Secondary | ICD-10-CM | POA: Diagnosis not present

## 2019-01-31 DIAGNOSIS — N2581 Secondary hyperparathyroidism of renal origin: Secondary | ICD-10-CM | POA: Diagnosis not present

## 2019-01-31 DIAGNOSIS — E440 Moderate protein-calorie malnutrition: Secondary | ICD-10-CM | POA: Diagnosis not present

## 2019-01-31 DIAGNOSIS — D509 Iron deficiency anemia, unspecified: Secondary | ICD-10-CM | POA: Diagnosis not present

## 2019-01-31 LAB — NOVEL CORONAVIRUS, NAA (HOSP ORDER, SEND-OUT TO REF LAB; TAT 18-24 HRS): SARS-CoV-2, NAA: NOT DETECTED

## 2019-02-01 DIAGNOSIS — N186 End stage renal disease: Secondary | ICD-10-CM | POA: Diagnosis not present

## 2019-02-01 DIAGNOSIS — D631 Anemia in chronic kidney disease: Secondary | ICD-10-CM | POA: Diagnosis not present

## 2019-02-01 DIAGNOSIS — N2581 Secondary hyperparathyroidism of renal origin: Secondary | ICD-10-CM | POA: Diagnosis not present

## 2019-02-01 DIAGNOSIS — D509 Iron deficiency anemia, unspecified: Secondary | ICD-10-CM | POA: Diagnosis not present

## 2019-02-01 DIAGNOSIS — Z992 Dependence on renal dialysis: Secondary | ICD-10-CM | POA: Diagnosis not present

## 2019-02-01 DIAGNOSIS — E440 Moderate protein-calorie malnutrition: Secondary | ICD-10-CM | POA: Diagnosis not present

## 2019-02-02 DIAGNOSIS — Z992 Dependence on renal dialysis: Secondary | ICD-10-CM | POA: Diagnosis not present

## 2019-02-02 DIAGNOSIS — E440 Moderate protein-calorie malnutrition: Secondary | ICD-10-CM | POA: Diagnosis not present

## 2019-02-02 DIAGNOSIS — D509 Iron deficiency anemia, unspecified: Secondary | ICD-10-CM | POA: Diagnosis not present

## 2019-02-02 DIAGNOSIS — N2581 Secondary hyperparathyroidism of renal origin: Secondary | ICD-10-CM | POA: Diagnosis not present

## 2019-02-02 DIAGNOSIS — D631 Anemia in chronic kidney disease: Secondary | ICD-10-CM | POA: Diagnosis not present

## 2019-02-02 DIAGNOSIS — N186 End stage renal disease: Secondary | ICD-10-CM | POA: Diagnosis not present

## 2019-02-03 DIAGNOSIS — N186 End stage renal disease: Secondary | ICD-10-CM | POA: Diagnosis not present

## 2019-02-03 DIAGNOSIS — Z992 Dependence on renal dialysis: Secondary | ICD-10-CM | POA: Diagnosis not present

## 2019-02-03 DIAGNOSIS — D631 Anemia in chronic kidney disease: Secondary | ICD-10-CM | POA: Diagnosis not present

## 2019-02-03 DIAGNOSIS — D509 Iron deficiency anemia, unspecified: Secondary | ICD-10-CM | POA: Diagnosis not present

## 2019-02-03 DIAGNOSIS — E440 Moderate protein-calorie malnutrition: Secondary | ICD-10-CM | POA: Diagnosis not present

## 2019-02-03 DIAGNOSIS — N2581 Secondary hyperparathyroidism of renal origin: Secondary | ICD-10-CM | POA: Diagnosis not present

## 2019-02-04 DIAGNOSIS — D631 Anemia in chronic kidney disease: Secondary | ICD-10-CM | POA: Diagnosis not present

## 2019-02-04 DIAGNOSIS — N2581 Secondary hyperparathyroidism of renal origin: Secondary | ICD-10-CM | POA: Diagnosis not present

## 2019-02-04 DIAGNOSIS — D509 Iron deficiency anemia, unspecified: Secondary | ICD-10-CM | POA: Diagnosis not present

## 2019-02-04 DIAGNOSIS — Z992 Dependence on renal dialysis: Secondary | ICD-10-CM | POA: Diagnosis not present

## 2019-02-04 DIAGNOSIS — N186 End stage renal disease: Secondary | ICD-10-CM | POA: Diagnosis not present

## 2019-02-04 DIAGNOSIS — E440 Moderate protein-calorie malnutrition: Secondary | ICD-10-CM | POA: Diagnosis not present

## 2019-02-05 DIAGNOSIS — D631 Anemia in chronic kidney disease: Secondary | ICD-10-CM | POA: Diagnosis not present

## 2019-02-05 DIAGNOSIS — Z992 Dependence on renal dialysis: Secondary | ICD-10-CM | POA: Diagnosis not present

## 2019-02-05 DIAGNOSIS — N186 End stage renal disease: Secondary | ICD-10-CM | POA: Diagnosis not present

## 2019-02-05 DIAGNOSIS — N2581 Secondary hyperparathyroidism of renal origin: Secondary | ICD-10-CM | POA: Diagnosis not present

## 2019-02-05 DIAGNOSIS — E440 Moderate protein-calorie malnutrition: Secondary | ICD-10-CM | POA: Diagnosis not present

## 2019-02-05 DIAGNOSIS — D509 Iron deficiency anemia, unspecified: Secondary | ICD-10-CM | POA: Diagnosis not present

## 2019-02-06 DIAGNOSIS — D631 Anemia in chronic kidney disease: Secondary | ICD-10-CM | POA: Diagnosis not present

## 2019-02-06 DIAGNOSIS — I129 Hypertensive chronic kidney disease with stage 1 through stage 4 chronic kidney disease, or unspecified chronic kidney disease: Secondary | ICD-10-CM | POA: Diagnosis not present

## 2019-02-06 DIAGNOSIS — Z992 Dependence on renal dialysis: Secondary | ICD-10-CM | POA: Diagnosis not present

## 2019-02-06 DIAGNOSIS — Z79899 Other long term (current) drug therapy: Secondary | ICD-10-CM | POA: Diagnosis not present

## 2019-02-06 DIAGNOSIS — D509 Iron deficiency anemia, unspecified: Secondary | ICD-10-CM | POA: Diagnosis not present

## 2019-02-06 DIAGNOSIS — E7841 Elevated Lipoprotein(a): Secondary | ICD-10-CM | POA: Diagnosis not present

## 2019-02-06 DIAGNOSIS — K769 Liver disease, unspecified: Secondary | ICD-10-CM | POA: Diagnosis not present

## 2019-02-06 DIAGNOSIS — N186 End stage renal disease: Secondary | ICD-10-CM | POA: Diagnosis not present

## 2019-02-06 DIAGNOSIS — N2581 Secondary hyperparathyroidism of renal origin: Secondary | ICD-10-CM | POA: Diagnosis not present

## 2019-02-06 DIAGNOSIS — E440 Moderate protein-calorie malnutrition: Secondary | ICD-10-CM | POA: Diagnosis not present

## 2019-02-07 DIAGNOSIS — D509 Iron deficiency anemia, unspecified: Secondary | ICD-10-CM | POA: Diagnosis not present

## 2019-02-07 DIAGNOSIS — Z992 Dependence on renal dialysis: Secondary | ICD-10-CM | POA: Diagnosis not present

## 2019-02-07 DIAGNOSIS — N186 End stage renal disease: Secondary | ICD-10-CM | POA: Diagnosis not present

## 2019-02-07 DIAGNOSIS — K769 Liver disease, unspecified: Secondary | ICD-10-CM | POA: Diagnosis not present

## 2019-02-07 DIAGNOSIS — E7841 Elevated Lipoprotein(a): Secondary | ICD-10-CM | POA: Diagnosis not present

## 2019-02-07 DIAGNOSIS — N2581 Secondary hyperparathyroidism of renal origin: Secondary | ICD-10-CM | POA: Diagnosis not present

## 2019-02-08 DIAGNOSIS — E7841 Elevated Lipoprotein(a): Secondary | ICD-10-CM | POA: Diagnosis not present

## 2019-02-08 DIAGNOSIS — N186 End stage renal disease: Secondary | ICD-10-CM | POA: Diagnosis not present

## 2019-02-08 DIAGNOSIS — N2581 Secondary hyperparathyroidism of renal origin: Secondary | ICD-10-CM | POA: Diagnosis not present

## 2019-02-08 DIAGNOSIS — K769 Liver disease, unspecified: Secondary | ICD-10-CM | POA: Diagnosis not present

## 2019-02-08 DIAGNOSIS — Z992 Dependence on renal dialysis: Secondary | ICD-10-CM | POA: Diagnosis not present

## 2019-02-08 DIAGNOSIS — D509 Iron deficiency anemia, unspecified: Secondary | ICD-10-CM | POA: Diagnosis not present

## 2019-02-09 DIAGNOSIS — N186 End stage renal disease: Secondary | ICD-10-CM | POA: Diagnosis not present

## 2019-02-09 DIAGNOSIS — K769 Liver disease, unspecified: Secondary | ICD-10-CM | POA: Diagnosis not present

## 2019-02-09 DIAGNOSIS — D509 Iron deficiency anemia, unspecified: Secondary | ICD-10-CM | POA: Diagnosis not present

## 2019-02-09 DIAGNOSIS — N2581 Secondary hyperparathyroidism of renal origin: Secondary | ICD-10-CM | POA: Diagnosis not present

## 2019-02-09 DIAGNOSIS — Z992 Dependence on renal dialysis: Secondary | ICD-10-CM | POA: Diagnosis not present

## 2019-02-09 DIAGNOSIS — E7841 Elevated Lipoprotein(a): Secondary | ICD-10-CM | POA: Diagnosis not present

## 2019-02-10 DIAGNOSIS — N186 End stage renal disease: Secondary | ICD-10-CM | POA: Diagnosis not present

## 2019-02-10 DIAGNOSIS — E7841 Elevated Lipoprotein(a): Secondary | ICD-10-CM | POA: Diagnosis not present

## 2019-02-10 DIAGNOSIS — N2589 Other disorders resulting from impaired renal tubular function: Secondary | ICD-10-CM | POA: Diagnosis not present

## 2019-02-10 DIAGNOSIS — Z992 Dependence on renal dialysis: Secondary | ICD-10-CM | POA: Diagnosis not present

## 2019-02-10 DIAGNOSIS — K769 Liver disease, unspecified: Secondary | ICD-10-CM | POA: Diagnosis not present

## 2019-02-10 DIAGNOSIS — N2581 Secondary hyperparathyroidism of renal origin: Secondary | ICD-10-CM | POA: Diagnosis not present

## 2019-02-10 DIAGNOSIS — D509 Iron deficiency anemia, unspecified: Secondary | ICD-10-CM | POA: Diagnosis not present

## 2019-02-11 DIAGNOSIS — K769 Liver disease, unspecified: Secondary | ICD-10-CM | POA: Diagnosis not present

## 2019-02-11 DIAGNOSIS — E7841 Elevated Lipoprotein(a): Secondary | ICD-10-CM | POA: Diagnosis not present

## 2019-02-11 DIAGNOSIS — N2581 Secondary hyperparathyroidism of renal origin: Secondary | ICD-10-CM | POA: Diagnosis not present

## 2019-02-11 DIAGNOSIS — Z992 Dependence on renal dialysis: Secondary | ICD-10-CM | POA: Diagnosis not present

## 2019-02-11 DIAGNOSIS — N186 End stage renal disease: Secondary | ICD-10-CM | POA: Diagnosis not present

## 2019-02-11 DIAGNOSIS — D509 Iron deficiency anemia, unspecified: Secondary | ICD-10-CM | POA: Diagnosis not present

## 2019-02-12 DIAGNOSIS — Z992 Dependence on renal dialysis: Secondary | ICD-10-CM | POA: Diagnosis not present

## 2019-02-12 DIAGNOSIS — K769 Liver disease, unspecified: Secondary | ICD-10-CM | POA: Diagnosis not present

## 2019-02-12 DIAGNOSIS — N2581 Secondary hyperparathyroidism of renal origin: Secondary | ICD-10-CM | POA: Diagnosis not present

## 2019-02-12 DIAGNOSIS — N186 End stage renal disease: Secondary | ICD-10-CM | POA: Diagnosis not present

## 2019-02-12 DIAGNOSIS — E7841 Elevated Lipoprotein(a): Secondary | ICD-10-CM | POA: Diagnosis not present

## 2019-02-12 DIAGNOSIS — D509 Iron deficiency anemia, unspecified: Secondary | ICD-10-CM | POA: Diagnosis not present

## 2019-02-13 DIAGNOSIS — D509 Iron deficiency anemia, unspecified: Secondary | ICD-10-CM | POA: Diagnosis not present

## 2019-02-13 DIAGNOSIS — K769 Liver disease, unspecified: Secondary | ICD-10-CM | POA: Diagnosis not present

## 2019-02-13 DIAGNOSIS — N2581 Secondary hyperparathyroidism of renal origin: Secondary | ICD-10-CM | POA: Diagnosis not present

## 2019-02-13 DIAGNOSIS — E7841 Elevated Lipoprotein(a): Secondary | ICD-10-CM | POA: Diagnosis not present

## 2019-02-13 DIAGNOSIS — Z992 Dependence on renal dialysis: Secondary | ICD-10-CM | POA: Diagnosis not present

## 2019-02-13 DIAGNOSIS — N186 End stage renal disease: Secondary | ICD-10-CM | POA: Diagnosis not present

## 2019-02-14 DIAGNOSIS — Z992 Dependence on renal dialysis: Secondary | ICD-10-CM | POA: Diagnosis not present

## 2019-02-14 DIAGNOSIS — N2581 Secondary hyperparathyroidism of renal origin: Secondary | ICD-10-CM | POA: Diagnosis not present

## 2019-02-14 DIAGNOSIS — N186 End stage renal disease: Secondary | ICD-10-CM | POA: Diagnosis not present

## 2019-02-14 DIAGNOSIS — D509 Iron deficiency anemia, unspecified: Secondary | ICD-10-CM | POA: Diagnosis not present

## 2019-02-14 DIAGNOSIS — K769 Liver disease, unspecified: Secondary | ICD-10-CM | POA: Diagnosis not present

## 2019-02-14 DIAGNOSIS — E7841 Elevated Lipoprotein(a): Secondary | ICD-10-CM | POA: Diagnosis not present

## 2019-02-15 DIAGNOSIS — N186 End stage renal disease: Secondary | ICD-10-CM | POA: Diagnosis not present

## 2019-02-15 DIAGNOSIS — K769 Liver disease, unspecified: Secondary | ICD-10-CM | POA: Diagnosis not present

## 2019-02-15 DIAGNOSIS — Z992 Dependence on renal dialysis: Secondary | ICD-10-CM | POA: Diagnosis not present

## 2019-02-15 DIAGNOSIS — D509 Iron deficiency anemia, unspecified: Secondary | ICD-10-CM | POA: Diagnosis not present

## 2019-02-15 DIAGNOSIS — N2581 Secondary hyperparathyroidism of renal origin: Secondary | ICD-10-CM | POA: Diagnosis not present

## 2019-02-15 DIAGNOSIS — E7841 Elevated Lipoprotein(a): Secondary | ICD-10-CM | POA: Diagnosis not present

## 2019-02-16 DIAGNOSIS — D509 Iron deficiency anemia, unspecified: Secondary | ICD-10-CM | POA: Diagnosis not present

## 2019-02-16 DIAGNOSIS — N186 End stage renal disease: Secondary | ICD-10-CM | POA: Diagnosis not present

## 2019-02-16 DIAGNOSIS — Z992 Dependence on renal dialysis: Secondary | ICD-10-CM | POA: Diagnosis not present

## 2019-02-16 DIAGNOSIS — E7841 Elevated Lipoprotein(a): Secondary | ICD-10-CM | POA: Diagnosis not present

## 2019-02-16 DIAGNOSIS — N2581 Secondary hyperparathyroidism of renal origin: Secondary | ICD-10-CM | POA: Diagnosis not present

## 2019-02-16 DIAGNOSIS — K769 Liver disease, unspecified: Secondary | ICD-10-CM | POA: Diagnosis not present

## 2019-02-17 DIAGNOSIS — K769 Liver disease, unspecified: Secondary | ICD-10-CM | POA: Diagnosis not present

## 2019-02-17 DIAGNOSIS — E7841 Elevated Lipoprotein(a): Secondary | ICD-10-CM | POA: Diagnosis not present

## 2019-02-17 DIAGNOSIS — N2581 Secondary hyperparathyroidism of renal origin: Secondary | ICD-10-CM | POA: Diagnosis not present

## 2019-02-17 DIAGNOSIS — N186 End stage renal disease: Secondary | ICD-10-CM | POA: Diagnosis not present

## 2019-02-17 DIAGNOSIS — D509 Iron deficiency anemia, unspecified: Secondary | ICD-10-CM | POA: Diagnosis not present

## 2019-02-17 DIAGNOSIS — Z992 Dependence on renal dialysis: Secondary | ICD-10-CM | POA: Diagnosis not present

## 2019-02-18 DIAGNOSIS — Z992 Dependence on renal dialysis: Secondary | ICD-10-CM | POA: Diagnosis not present

## 2019-02-18 DIAGNOSIS — K769 Liver disease, unspecified: Secondary | ICD-10-CM | POA: Diagnosis not present

## 2019-02-18 DIAGNOSIS — E7841 Elevated Lipoprotein(a): Secondary | ICD-10-CM | POA: Diagnosis not present

## 2019-02-18 DIAGNOSIS — D509 Iron deficiency anemia, unspecified: Secondary | ICD-10-CM | POA: Diagnosis not present

## 2019-02-18 DIAGNOSIS — N2581 Secondary hyperparathyroidism of renal origin: Secondary | ICD-10-CM | POA: Diagnosis not present

## 2019-02-18 DIAGNOSIS — N186 End stage renal disease: Secondary | ICD-10-CM | POA: Diagnosis not present

## 2019-02-19 DIAGNOSIS — Z992 Dependence on renal dialysis: Secondary | ICD-10-CM | POA: Diagnosis not present

## 2019-02-19 DIAGNOSIS — K769 Liver disease, unspecified: Secondary | ICD-10-CM | POA: Diagnosis not present

## 2019-02-19 DIAGNOSIS — N186 End stage renal disease: Secondary | ICD-10-CM | POA: Diagnosis not present

## 2019-02-19 DIAGNOSIS — N2581 Secondary hyperparathyroidism of renal origin: Secondary | ICD-10-CM | POA: Diagnosis not present

## 2019-02-19 DIAGNOSIS — E7841 Elevated Lipoprotein(a): Secondary | ICD-10-CM | POA: Diagnosis not present

## 2019-02-19 DIAGNOSIS — D509 Iron deficiency anemia, unspecified: Secondary | ICD-10-CM | POA: Diagnosis not present

## 2019-02-20 DIAGNOSIS — K769 Liver disease, unspecified: Secondary | ICD-10-CM | POA: Diagnosis not present

## 2019-02-20 DIAGNOSIS — N2581 Secondary hyperparathyroidism of renal origin: Secondary | ICD-10-CM | POA: Diagnosis not present

## 2019-02-20 DIAGNOSIS — E7841 Elevated Lipoprotein(a): Secondary | ICD-10-CM | POA: Diagnosis not present

## 2019-02-20 DIAGNOSIS — Z992 Dependence on renal dialysis: Secondary | ICD-10-CM | POA: Diagnosis not present

## 2019-02-20 DIAGNOSIS — D509 Iron deficiency anemia, unspecified: Secondary | ICD-10-CM | POA: Diagnosis not present

## 2019-02-20 DIAGNOSIS — N186 End stage renal disease: Secondary | ICD-10-CM | POA: Diagnosis not present

## 2019-02-21 DIAGNOSIS — N186 End stage renal disease: Secondary | ICD-10-CM | POA: Diagnosis not present

## 2019-02-21 DIAGNOSIS — N2581 Secondary hyperparathyroidism of renal origin: Secondary | ICD-10-CM | POA: Diagnosis not present

## 2019-02-21 DIAGNOSIS — D509 Iron deficiency anemia, unspecified: Secondary | ICD-10-CM | POA: Diagnosis not present

## 2019-02-21 DIAGNOSIS — K769 Liver disease, unspecified: Secondary | ICD-10-CM | POA: Diagnosis not present

## 2019-02-21 DIAGNOSIS — E7841 Elevated Lipoprotein(a): Secondary | ICD-10-CM | POA: Diagnosis not present

## 2019-02-21 DIAGNOSIS — Z992 Dependence on renal dialysis: Secondary | ICD-10-CM | POA: Diagnosis not present

## 2019-02-22 DIAGNOSIS — D509 Iron deficiency anemia, unspecified: Secondary | ICD-10-CM | POA: Diagnosis not present

## 2019-02-22 DIAGNOSIS — Z992 Dependence on renal dialysis: Secondary | ICD-10-CM | POA: Diagnosis not present

## 2019-02-22 DIAGNOSIS — N2581 Secondary hyperparathyroidism of renal origin: Secondary | ICD-10-CM | POA: Diagnosis not present

## 2019-02-22 DIAGNOSIS — E7841 Elevated Lipoprotein(a): Secondary | ICD-10-CM | POA: Diagnosis not present

## 2019-02-22 DIAGNOSIS — N186 End stage renal disease: Secondary | ICD-10-CM | POA: Diagnosis not present

## 2019-02-22 DIAGNOSIS — K769 Liver disease, unspecified: Secondary | ICD-10-CM | POA: Diagnosis not present

## 2019-02-23 DIAGNOSIS — K769 Liver disease, unspecified: Secondary | ICD-10-CM | POA: Diagnosis not present

## 2019-02-23 DIAGNOSIS — Z992 Dependence on renal dialysis: Secondary | ICD-10-CM | POA: Diagnosis not present

## 2019-02-23 DIAGNOSIS — N2581 Secondary hyperparathyroidism of renal origin: Secondary | ICD-10-CM | POA: Diagnosis not present

## 2019-02-23 DIAGNOSIS — N186 End stage renal disease: Secondary | ICD-10-CM | POA: Diagnosis not present

## 2019-02-23 DIAGNOSIS — D509 Iron deficiency anemia, unspecified: Secondary | ICD-10-CM | POA: Diagnosis not present

## 2019-02-23 DIAGNOSIS — E7841 Elevated Lipoprotein(a): Secondary | ICD-10-CM | POA: Diagnosis not present

## 2019-02-24 DIAGNOSIS — N186 End stage renal disease: Secondary | ICD-10-CM | POA: Diagnosis not present

## 2019-02-24 DIAGNOSIS — Z992 Dependence on renal dialysis: Secondary | ICD-10-CM | POA: Diagnosis not present

## 2019-02-24 DIAGNOSIS — E7841 Elevated Lipoprotein(a): Secondary | ICD-10-CM | POA: Diagnosis not present

## 2019-02-24 DIAGNOSIS — D509 Iron deficiency anemia, unspecified: Secondary | ICD-10-CM | POA: Diagnosis not present

## 2019-02-24 DIAGNOSIS — K769 Liver disease, unspecified: Secondary | ICD-10-CM | POA: Diagnosis not present

## 2019-02-24 DIAGNOSIS — N2581 Secondary hyperparathyroidism of renal origin: Secondary | ICD-10-CM | POA: Diagnosis not present

## 2019-02-25 DIAGNOSIS — D509 Iron deficiency anemia, unspecified: Secondary | ICD-10-CM | POA: Diagnosis not present

## 2019-02-25 DIAGNOSIS — E7841 Elevated Lipoprotein(a): Secondary | ICD-10-CM | POA: Diagnosis not present

## 2019-02-25 DIAGNOSIS — N186 End stage renal disease: Secondary | ICD-10-CM | POA: Diagnosis not present

## 2019-02-25 DIAGNOSIS — K769 Liver disease, unspecified: Secondary | ICD-10-CM | POA: Diagnosis not present

## 2019-02-25 DIAGNOSIS — Z992 Dependence on renal dialysis: Secondary | ICD-10-CM | POA: Diagnosis not present

## 2019-02-25 DIAGNOSIS — N2581 Secondary hyperparathyroidism of renal origin: Secondary | ICD-10-CM | POA: Diagnosis not present

## 2019-02-26 DIAGNOSIS — E7841 Elevated Lipoprotein(a): Secondary | ICD-10-CM | POA: Diagnosis not present

## 2019-02-26 DIAGNOSIS — K769 Liver disease, unspecified: Secondary | ICD-10-CM | POA: Diagnosis not present

## 2019-02-26 DIAGNOSIS — N186 End stage renal disease: Secondary | ICD-10-CM | POA: Diagnosis not present

## 2019-02-26 DIAGNOSIS — N2581 Secondary hyperparathyroidism of renal origin: Secondary | ICD-10-CM | POA: Diagnosis not present

## 2019-02-26 DIAGNOSIS — Z992 Dependence on renal dialysis: Secondary | ICD-10-CM | POA: Diagnosis not present

## 2019-02-26 DIAGNOSIS — D509 Iron deficiency anemia, unspecified: Secondary | ICD-10-CM | POA: Diagnosis not present

## 2019-02-27 DIAGNOSIS — K769 Liver disease, unspecified: Secondary | ICD-10-CM | POA: Diagnosis not present

## 2019-02-27 DIAGNOSIS — Z992 Dependence on renal dialysis: Secondary | ICD-10-CM | POA: Diagnosis not present

## 2019-02-27 DIAGNOSIS — E7841 Elevated Lipoprotein(a): Secondary | ICD-10-CM | POA: Diagnosis not present

## 2019-02-27 DIAGNOSIS — N186 End stage renal disease: Secondary | ICD-10-CM | POA: Diagnosis not present

## 2019-02-27 DIAGNOSIS — D509 Iron deficiency anemia, unspecified: Secondary | ICD-10-CM | POA: Diagnosis not present

## 2019-02-27 DIAGNOSIS — N2581 Secondary hyperparathyroidism of renal origin: Secondary | ICD-10-CM | POA: Diagnosis not present

## 2019-02-28 DIAGNOSIS — D509 Iron deficiency anemia, unspecified: Secondary | ICD-10-CM | POA: Diagnosis not present

## 2019-02-28 DIAGNOSIS — N186 End stage renal disease: Secondary | ICD-10-CM | POA: Diagnosis not present

## 2019-02-28 DIAGNOSIS — Z992 Dependence on renal dialysis: Secondary | ICD-10-CM | POA: Diagnosis not present

## 2019-02-28 DIAGNOSIS — E7841 Elevated Lipoprotein(a): Secondary | ICD-10-CM | POA: Diagnosis not present

## 2019-02-28 DIAGNOSIS — N2581 Secondary hyperparathyroidism of renal origin: Secondary | ICD-10-CM | POA: Diagnosis not present

## 2019-02-28 DIAGNOSIS — K769 Liver disease, unspecified: Secondary | ICD-10-CM | POA: Diagnosis not present

## 2019-03-01 DIAGNOSIS — Z992 Dependence on renal dialysis: Secondary | ICD-10-CM | POA: Diagnosis not present

## 2019-03-01 DIAGNOSIS — N186 End stage renal disease: Secondary | ICD-10-CM | POA: Diagnosis not present

## 2019-03-01 DIAGNOSIS — K769 Liver disease, unspecified: Secondary | ICD-10-CM | POA: Diagnosis not present

## 2019-03-01 DIAGNOSIS — D509 Iron deficiency anemia, unspecified: Secondary | ICD-10-CM | POA: Diagnosis not present

## 2019-03-01 DIAGNOSIS — E7841 Elevated Lipoprotein(a): Secondary | ICD-10-CM | POA: Diagnosis not present

## 2019-03-01 DIAGNOSIS — N2581 Secondary hyperparathyroidism of renal origin: Secondary | ICD-10-CM | POA: Diagnosis not present

## 2019-03-02 DIAGNOSIS — E7841 Elevated Lipoprotein(a): Secondary | ICD-10-CM | POA: Diagnosis not present

## 2019-03-02 DIAGNOSIS — Z992 Dependence on renal dialysis: Secondary | ICD-10-CM | POA: Diagnosis not present

## 2019-03-02 DIAGNOSIS — D509 Iron deficiency anemia, unspecified: Secondary | ICD-10-CM | POA: Diagnosis not present

## 2019-03-02 DIAGNOSIS — N2581 Secondary hyperparathyroidism of renal origin: Secondary | ICD-10-CM | POA: Diagnosis not present

## 2019-03-02 DIAGNOSIS — N186 End stage renal disease: Secondary | ICD-10-CM | POA: Diagnosis not present

## 2019-03-02 DIAGNOSIS — K769 Liver disease, unspecified: Secondary | ICD-10-CM | POA: Diagnosis not present

## 2019-03-03 DIAGNOSIS — Z992 Dependence on renal dialysis: Secondary | ICD-10-CM | POA: Diagnosis not present

## 2019-03-03 DIAGNOSIS — E7841 Elevated Lipoprotein(a): Secondary | ICD-10-CM | POA: Diagnosis not present

## 2019-03-03 DIAGNOSIS — N2581 Secondary hyperparathyroidism of renal origin: Secondary | ICD-10-CM | POA: Diagnosis not present

## 2019-03-03 DIAGNOSIS — D509 Iron deficiency anemia, unspecified: Secondary | ICD-10-CM | POA: Diagnosis not present

## 2019-03-03 DIAGNOSIS — K769 Liver disease, unspecified: Secondary | ICD-10-CM | POA: Diagnosis not present

## 2019-03-03 DIAGNOSIS — N186 End stage renal disease: Secondary | ICD-10-CM | POA: Diagnosis not present

## 2019-03-04 DIAGNOSIS — N186 End stage renal disease: Secondary | ICD-10-CM | POA: Diagnosis not present

## 2019-03-04 DIAGNOSIS — K769 Liver disease, unspecified: Secondary | ICD-10-CM | POA: Diagnosis not present

## 2019-03-04 DIAGNOSIS — D509 Iron deficiency anemia, unspecified: Secondary | ICD-10-CM | POA: Diagnosis not present

## 2019-03-04 DIAGNOSIS — Z992 Dependence on renal dialysis: Secondary | ICD-10-CM | POA: Diagnosis not present

## 2019-03-04 DIAGNOSIS — E7841 Elevated Lipoprotein(a): Secondary | ICD-10-CM | POA: Diagnosis not present

## 2019-03-04 DIAGNOSIS — N2581 Secondary hyperparathyroidism of renal origin: Secondary | ICD-10-CM | POA: Diagnosis not present

## 2019-03-05 DIAGNOSIS — K769 Liver disease, unspecified: Secondary | ICD-10-CM | POA: Diagnosis not present

## 2019-03-05 DIAGNOSIS — N2581 Secondary hyperparathyroidism of renal origin: Secondary | ICD-10-CM | POA: Diagnosis not present

## 2019-03-05 DIAGNOSIS — D509 Iron deficiency anemia, unspecified: Secondary | ICD-10-CM | POA: Diagnosis not present

## 2019-03-05 DIAGNOSIS — Z992 Dependence on renal dialysis: Secondary | ICD-10-CM | POA: Diagnosis not present

## 2019-03-05 DIAGNOSIS — N186 End stage renal disease: Secondary | ICD-10-CM | POA: Diagnosis not present

## 2019-03-05 DIAGNOSIS — E7841 Elevated Lipoprotein(a): Secondary | ICD-10-CM | POA: Diagnosis not present

## 2019-03-06 DIAGNOSIS — E7841 Elevated Lipoprotein(a): Secondary | ICD-10-CM | POA: Diagnosis not present

## 2019-03-06 DIAGNOSIS — N2581 Secondary hyperparathyroidism of renal origin: Secondary | ICD-10-CM | POA: Diagnosis not present

## 2019-03-06 DIAGNOSIS — K769 Liver disease, unspecified: Secondary | ICD-10-CM | POA: Diagnosis not present

## 2019-03-06 DIAGNOSIS — D509 Iron deficiency anemia, unspecified: Secondary | ICD-10-CM | POA: Diagnosis not present

## 2019-03-06 DIAGNOSIS — N186 End stage renal disease: Secondary | ICD-10-CM | POA: Diagnosis not present

## 2019-03-06 DIAGNOSIS — Z992 Dependence on renal dialysis: Secondary | ICD-10-CM | POA: Diagnosis not present

## 2019-03-07 DIAGNOSIS — K769 Liver disease, unspecified: Secondary | ICD-10-CM | POA: Diagnosis not present

## 2019-03-07 DIAGNOSIS — N2581 Secondary hyperparathyroidism of renal origin: Secondary | ICD-10-CM | POA: Diagnosis not present

## 2019-03-07 DIAGNOSIS — Z992 Dependence on renal dialysis: Secondary | ICD-10-CM | POA: Diagnosis not present

## 2019-03-07 DIAGNOSIS — N186 End stage renal disease: Secondary | ICD-10-CM | POA: Diagnosis not present

## 2019-03-07 DIAGNOSIS — E7841 Elevated Lipoprotein(a): Secondary | ICD-10-CM | POA: Diagnosis not present

## 2019-03-07 DIAGNOSIS — D509 Iron deficiency anemia, unspecified: Secondary | ICD-10-CM | POA: Diagnosis not present

## 2019-03-09 DIAGNOSIS — E7841 Elevated Lipoprotein(a): Secondary | ICD-10-CM | POA: Diagnosis not present

## 2019-03-09 DIAGNOSIS — N2589 Other disorders resulting from impaired renal tubular function: Secondary | ICD-10-CM | POA: Diagnosis not present

## 2019-04-08 DIAGNOSIS — Z4932 Encounter for adequacy testing for peritoneal dialysis: Secondary | ICD-10-CM | POA: Diagnosis not present

## 2019-04-08 DIAGNOSIS — D509 Iron deficiency anemia, unspecified: Secondary | ICD-10-CM | POA: Diagnosis not present

## 2019-04-08 DIAGNOSIS — Z992 Dependence on renal dialysis: Secondary | ICD-10-CM | POA: Diagnosis not present

## 2019-04-08 DIAGNOSIS — E440 Moderate protein-calorie malnutrition: Secondary | ICD-10-CM | POA: Diagnosis not present

## 2019-04-08 DIAGNOSIS — Z79899 Other long term (current) drug therapy: Secondary | ICD-10-CM | POA: Diagnosis not present

## 2019-04-08 DIAGNOSIS — N2581 Secondary hyperparathyroidism of renal origin: Secondary | ICD-10-CM | POA: Diagnosis not present

## 2019-04-08 DIAGNOSIS — N186 End stage renal disease: Secondary | ICD-10-CM | POA: Diagnosis not present

## 2019-04-08 DIAGNOSIS — N2589 Other disorders resulting from impaired renal tubular function: Secondary | ICD-10-CM | POA: Diagnosis not present

## 2019-04-08 DIAGNOSIS — D631 Anemia in chronic kidney disease: Secondary | ICD-10-CM | POA: Diagnosis not present

## 2019-04-09 DIAGNOSIS — E440 Moderate protein-calorie malnutrition: Secondary | ICD-10-CM | POA: Diagnosis not present

## 2019-04-09 DIAGNOSIS — N2589 Other disorders resulting from impaired renal tubular function: Secondary | ICD-10-CM | POA: Diagnosis not present

## 2019-04-09 DIAGNOSIS — D509 Iron deficiency anemia, unspecified: Secondary | ICD-10-CM | POA: Diagnosis not present

## 2019-04-09 DIAGNOSIS — Z4932 Encounter for adequacy testing for peritoneal dialysis: Secondary | ICD-10-CM | POA: Diagnosis not present

## 2019-04-09 DIAGNOSIS — Z992 Dependence on renal dialysis: Secondary | ICD-10-CM | POA: Diagnosis not present

## 2019-04-09 DIAGNOSIS — N186 End stage renal disease: Secondary | ICD-10-CM | POA: Diagnosis not present

## 2019-04-09 DIAGNOSIS — D631 Anemia in chronic kidney disease: Secondary | ICD-10-CM | POA: Diagnosis not present

## 2019-04-09 DIAGNOSIS — N2581 Secondary hyperparathyroidism of renal origin: Secondary | ICD-10-CM | POA: Diagnosis not present

## 2019-04-09 DIAGNOSIS — Z79899 Other long term (current) drug therapy: Secondary | ICD-10-CM | POA: Diagnosis not present

## 2019-04-10 DIAGNOSIS — D509 Iron deficiency anemia, unspecified: Secondary | ICD-10-CM | POA: Diagnosis not present

## 2019-04-10 DIAGNOSIS — D631 Anemia in chronic kidney disease: Secondary | ICD-10-CM | POA: Diagnosis not present

## 2019-04-10 DIAGNOSIS — Z79899 Other long term (current) drug therapy: Secondary | ICD-10-CM | POA: Diagnosis not present

## 2019-04-10 DIAGNOSIS — Z992 Dependence on renal dialysis: Secondary | ICD-10-CM | POA: Diagnosis not present

## 2019-04-10 DIAGNOSIS — N2581 Secondary hyperparathyroidism of renal origin: Secondary | ICD-10-CM | POA: Diagnosis not present

## 2019-04-10 DIAGNOSIS — N186 End stage renal disease: Secondary | ICD-10-CM | POA: Diagnosis not present

## 2019-04-10 DIAGNOSIS — N2589 Other disorders resulting from impaired renal tubular function: Secondary | ICD-10-CM | POA: Diagnosis not present

## 2019-04-10 DIAGNOSIS — E440 Moderate protein-calorie malnutrition: Secondary | ICD-10-CM | POA: Diagnosis not present

## 2019-04-10 DIAGNOSIS — Z4932 Encounter for adequacy testing for peritoneal dialysis: Secondary | ICD-10-CM | POA: Diagnosis not present

## 2019-04-11 DIAGNOSIS — E440 Moderate protein-calorie malnutrition: Secondary | ICD-10-CM | POA: Diagnosis not present

## 2019-04-11 DIAGNOSIS — N2589 Other disorders resulting from impaired renal tubular function: Secondary | ICD-10-CM | POA: Diagnosis not present

## 2019-04-11 DIAGNOSIS — Z79899 Other long term (current) drug therapy: Secondary | ICD-10-CM | POA: Diagnosis not present

## 2019-04-11 DIAGNOSIS — D509 Iron deficiency anemia, unspecified: Secondary | ICD-10-CM | POA: Diagnosis not present

## 2019-04-11 DIAGNOSIS — N186 End stage renal disease: Secondary | ICD-10-CM | POA: Diagnosis not present

## 2019-04-11 DIAGNOSIS — D631 Anemia in chronic kidney disease: Secondary | ICD-10-CM | POA: Diagnosis not present

## 2019-04-11 DIAGNOSIS — Z4932 Encounter for adequacy testing for peritoneal dialysis: Secondary | ICD-10-CM | POA: Diagnosis not present

## 2019-04-11 DIAGNOSIS — Z992 Dependence on renal dialysis: Secondary | ICD-10-CM | POA: Diagnosis not present

## 2019-04-11 DIAGNOSIS — N2581 Secondary hyperparathyroidism of renal origin: Secondary | ICD-10-CM | POA: Diagnosis not present

## 2019-04-12 DIAGNOSIS — N2589 Other disorders resulting from impaired renal tubular function: Secondary | ICD-10-CM | POA: Diagnosis not present

## 2019-04-12 DIAGNOSIS — Z4932 Encounter for adequacy testing for peritoneal dialysis: Secondary | ICD-10-CM | POA: Diagnosis not present

## 2019-04-12 DIAGNOSIS — E440 Moderate protein-calorie malnutrition: Secondary | ICD-10-CM | POA: Diagnosis not present

## 2019-04-12 DIAGNOSIS — Z79899 Other long term (current) drug therapy: Secondary | ICD-10-CM | POA: Diagnosis not present

## 2019-04-12 DIAGNOSIS — D631 Anemia in chronic kidney disease: Secondary | ICD-10-CM | POA: Diagnosis not present

## 2019-04-12 DIAGNOSIS — D509 Iron deficiency anemia, unspecified: Secondary | ICD-10-CM | POA: Diagnosis not present

## 2019-04-12 DIAGNOSIS — N2581 Secondary hyperparathyroidism of renal origin: Secondary | ICD-10-CM | POA: Diagnosis not present

## 2019-04-12 DIAGNOSIS — Z992 Dependence on renal dialysis: Secondary | ICD-10-CM | POA: Diagnosis not present

## 2019-04-12 DIAGNOSIS — N186 End stage renal disease: Secondary | ICD-10-CM | POA: Diagnosis not present

## 2019-04-13 DIAGNOSIS — N2581 Secondary hyperparathyroidism of renal origin: Secondary | ICD-10-CM | POA: Diagnosis not present

## 2019-04-13 DIAGNOSIS — Z4932 Encounter for adequacy testing for peritoneal dialysis: Secondary | ICD-10-CM | POA: Diagnosis not present

## 2019-04-13 DIAGNOSIS — N2589 Other disorders resulting from impaired renal tubular function: Secondary | ICD-10-CM | POA: Diagnosis not present

## 2019-04-13 DIAGNOSIS — Z79899 Other long term (current) drug therapy: Secondary | ICD-10-CM | POA: Diagnosis not present

## 2019-04-13 DIAGNOSIS — N186 End stage renal disease: Secondary | ICD-10-CM | POA: Diagnosis not present

## 2019-04-13 DIAGNOSIS — Z992 Dependence on renal dialysis: Secondary | ICD-10-CM | POA: Diagnosis not present

## 2019-04-13 DIAGNOSIS — E440 Moderate protein-calorie malnutrition: Secondary | ICD-10-CM | POA: Diagnosis not present

## 2019-04-13 DIAGNOSIS — D509 Iron deficiency anemia, unspecified: Secondary | ICD-10-CM | POA: Diagnosis not present

## 2019-04-13 DIAGNOSIS — D631 Anemia in chronic kidney disease: Secondary | ICD-10-CM | POA: Diagnosis not present

## 2019-04-14 DIAGNOSIS — N2581 Secondary hyperparathyroidism of renal origin: Secondary | ICD-10-CM | POA: Diagnosis not present

## 2019-04-14 DIAGNOSIS — N2589 Other disorders resulting from impaired renal tubular function: Secondary | ICD-10-CM | POA: Diagnosis not present

## 2019-04-14 DIAGNOSIS — E440 Moderate protein-calorie malnutrition: Secondary | ICD-10-CM | POA: Diagnosis not present

## 2019-04-14 DIAGNOSIS — D509 Iron deficiency anemia, unspecified: Secondary | ICD-10-CM | POA: Diagnosis not present

## 2019-04-14 DIAGNOSIS — Z992 Dependence on renal dialysis: Secondary | ICD-10-CM | POA: Diagnosis not present

## 2019-04-14 DIAGNOSIS — N186 End stage renal disease: Secondary | ICD-10-CM | POA: Diagnosis not present

## 2019-04-14 DIAGNOSIS — Z4932 Encounter for adequacy testing for peritoneal dialysis: Secondary | ICD-10-CM | POA: Diagnosis not present

## 2019-04-14 DIAGNOSIS — D631 Anemia in chronic kidney disease: Secondary | ICD-10-CM | POA: Diagnosis not present

## 2019-04-14 DIAGNOSIS — Z79899 Other long term (current) drug therapy: Secondary | ICD-10-CM | POA: Diagnosis not present

## 2019-04-15 DIAGNOSIS — D631 Anemia in chronic kidney disease: Secondary | ICD-10-CM | POA: Diagnosis not present

## 2019-04-15 DIAGNOSIS — D509 Iron deficiency anemia, unspecified: Secondary | ICD-10-CM | POA: Diagnosis not present

## 2019-04-15 DIAGNOSIS — Z992 Dependence on renal dialysis: Secondary | ICD-10-CM | POA: Diagnosis not present

## 2019-04-15 DIAGNOSIS — E440 Moderate protein-calorie malnutrition: Secondary | ICD-10-CM | POA: Diagnosis not present

## 2019-04-15 DIAGNOSIS — N2589 Other disorders resulting from impaired renal tubular function: Secondary | ICD-10-CM | POA: Diagnosis not present

## 2019-04-15 DIAGNOSIS — N186 End stage renal disease: Secondary | ICD-10-CM | POA: Diagnosis not present

## 2019-04-15 DIAGNOSIS — N2581 Secondary hyperparathyroidism of renal origin: Secondary | ICD-10-CM | POA: Diagnosis not present

## 2019-04-15 DIAGNOSIS — Z79899 Other long term (current) drug therapy: Secondary | ICD-10-CM | POA: Diagnosis not present

## 2019-04-15 DIAGNOSIS — Z4932 Encounter for adequacy testing for peritoneal dialysis: Secondary | ICD-10-CM | POA: Diagnosis not present

## 2019-04-16 DIAGNOSIS — N186 End stage renal disease: Secondary | ICD-10-CM | POA: Diagnosis not present

## 2019-04-16 DIAGNOSIS — N2581 Secondary hyperparathyroidism of renal origin: Secondary | ICD-10-CM | POA: Diagnosis not present

## 2019-04-16 DIAGNOSIS — D509 Iron deficiency anemia, unspecified: Secondary | ICD-10-CM | POA: Diagnosis not present

## 2019-04-16 DIAGNOSIS — E440 Moderate protein-calorie malnutrition: Secondary | ICD-10-CM | POA: Diagnosis not present

## 2019-04-16 DIAGNOSIS — Z992 Dependence on renal dialysis: Secondary | ICD-10-CM | POA: Diagnosis not present

## 2019-04-16 DIAGNOSIS — D631 Anemia in chronic kidney disease: Secondary | ICD-10-CM | POA: Diagnosis not present

## 2019-04-16 DIAGNOSIS — Z79899 Other long term (current) drug therapy: Secondary | ICD-10-CM | POA: Diagnosis not present

## 2019-04-16 DIAGNOSIS — Z4932 Encounter for adequacy testing for peritoneal dialysis: Secondary | ICD-10-CM | POA: Diagnosis not present

## 2019-04-16 DIAGNOSIS — N2589 Other disorders resulting from impaired renal tubular function: Secondary | ICD-10-CM | POA: Diagnosis not present

## 2019-04-17 DIAGNOSIS — N2581 Secondary hyperparathyroidism of renal origin: Secondary | ICD-10-CM | POA: Diagnosis not present

## 2019-04-17 DIAGNOSIS — D631 Anemia in chronic kidney disease: Secondary | ICD-10-CM | POA: Diagnosis not present

## 2019-04-17 DIAGNOSIS — Z79899 Other long term (current) drug therapy: Secondary | ICD-10-CM | POA: Diagnosis not present

## 2019-04-17 DIAGNOSIS — E440 Moderate protein-calorie malnutrition: Secondary | ICD-10-CM | POA: Diagnosis not present

## 2019-04-17 DIAGNOSIS — Z4932 Encounter for adequacy testing for peritoneal dialysis: Secondary | ICD-10-CM | POA: Diagnosis not present

## 2019-04-17 DIAGNOSIS — D509 Iron deficiency anemia, unspecified: Secondary | ICD-10-CM | POA: Diagnosis not present

## 2019-04-17 DIAGNOSIS — N2589 Other disorders resulting from impaired renal tubular function: Secondary | ICD-10-CM | POA: Diagnosis not present

## 2019-04-17 DIAGNOSIS — Z992 Dependence on renal dialysis: Secondary | ICD-10-CM | POA: Diagnosis not present

## 2019-04-17 DIAGNOSIS — N186 End stage renal disease: Secondary | ICD-10-CM | POA: Diagnosis not present

## 2019-04-18 DIAGNOSIS — E440 Moderate protein-calorie malnutrition: Secondary | ICD-10-CM | POA: Diagnosis not present

## 2019-04-18 DIAGNOSIS — Z79899 Other long term (current) drug therapy: Secondary | ICD-10-CM | POA: Diagnosis not present

## 2019-04-18 DIAGNOSIS — N186 End stage renal disease: Secondary | ICD-10-CM | POA: Diagnosis not present

## 2019-04-18 DIAGNOSIS — D631 Anemia in chronic kidney disease: Secondary | ICD-10-CM | POA: Diagnosis not present

## 2019-04-18 DIAGNOSIS — Z4932 Encounter for adequacy testing for peritoneal dialysis: Secondary | ICD-10-CM | POA: Diagnosis not present

## 2019-04-18 DIAGNOSIS — Z992 Dependence on renal dialysis: Secondary | ICD-10-CM | POA: Diagnosis not present

## 2019-04-18 DIAGNOSIS — N2589 Other disorders resulting from impaired renal tubular function: Secondary | ICD-10-CM | POA: Diagnosis not present

## 2019-04-18 DIAGNOSIS — N2581 Secondary hyperparathyroidism of renal origin: Secondary | ICD-10-CM | POA: Diagnosis not present

## 2019-04-18 DIAGNOSIS — D509 Iron deficiency anemia, unspecified: Secondary | ICD-10-CM | POA: Diagnosis not present

## 2019-04-19 DIAGNOSIS — D631 Anemia in chronic kidney disease: Secondary | ICD-10-CM | POA: Diagnosis not present

## 2019-04-19 DIAGNOSIS — Z4932 Encounter for adequacy testing for peritoneal dialysis: Secondary | ICD-10-CM | POA: Diagnosis not present

## 2019-04-19 DIAGNOSIS — D509 Iron deficiency anemia, unspecified: Secondary | ICD-10-CM | POA: Diagnosis not present

## 2019-04-19 DIAGNOSIS — Z79899 Other long term (current) drug therapy: Secondary | ICD-10-CM | POA: Diagnosis not present

## 2019-04-19 DIAGNOSIS — N2589 Other disorders resulting from impaired renal tubular function: Secondary | ICD-10-CM | POA: Diagnosis not present

## 2019-04-19 DIAGNOSIS — E440 Moderate protein-calorie malnutrition: Secondary | ICD-10-CM | POA: Diagnosis not present

## 2019-04-19 DIAGNOSIS — N186 End stage renal disease: Secondary | ICD-10-CM | POA: Diagnosis not present

## 2019-04-19 DIAGNOSIS — Z992 Dependence on renal dialysis: Secondary | ICD-10-CM | POA: Diagnosis not present

## 2019-04-19 DIAGNOSIS — N2581 Secondary hyperparathyroidism of renal origin: Secondary | ICD-10-CM | POA: Diagnosis not present

## 2019-04-20 DIAGNOSIS — Z4932 Encounter for adequacy testing for peritoneal dialysis: Secondary | ICD-10-CM | POA: Diagnosis not present

## 2019-04-20 DIAGNOSIS — E440 Moderate protein-calorie malnutrition: Secondary | ICD-10-CM | POA: Diagnosis not present

## 2019-04-20 DIAGNOSIS — N186 End stage renal disease: Secondary | ICD-10-CM | POA: Diagnosis not present

## 2019-04-20 DIAGNOSIS — Z79899 Other long term (current) drug therapy: Secondary | ICD-10-CM | POA: Diagnosis not present

## 2019-04-20 DIAGNOSIS — N2589 Other disorders resulting from impaired renal tubular function: Secondary | ICD-10-CM | POA: Diagnosis not present

## 2019-04-20 DIAGNOSIS — N2581 Secondary hyperparathyroidism of renal origin: Secondary | ICD-10-CM | POA: Diagnosis not present

## 2019-04-20 DIAGNOSIS — D509 Iron deficiency anemia, unspecified: Secondary | ICD-10-CM | POA: Diagnosis not present

## 2019-04-20 DIAGNOSIS — D631 Anemia in chronic kidney disease: Secondary | ICD-10-CM | POA: Diagnosis not present

## 2019-04-20 DIAGNOSIS — Z992 Dependence on renal dialysis: Secondary | ICD-10-CM | POA: Diagnosis not present

## 2019-04-21 DIAGNOSIS — D631 Anemia in chronic kidney disease: Secondary | ICD-10-CM | POA: Diagnosis not present

## 2019-04-21 DIAGNOSIS — N2589 Other disorders resulting from impaired renal tubular function: Secondary | ICD-10-CM | POA: Diagnosis not present

## 2019-04-21 DIAGNOSIS — Z79899 Other long term (current) drug therapy: Secondary | ICD-10-CM | POA: Diagnosis not present

## 2019-04-21 DIAGNOSIS — Z4932 Encounter for adequacy testing for peritoneal dialysis: Secondary | ICD-10-CM | POA: Diagnosis not present

## 2019-04-21 DIAGNOSIS — N186 End stage renal disease: Secondary | ICD-10-CM | POA: Diagnosis not present

## 2019-04-21 DIAGNOSIS — D509 Iron deficiency anemia, unspecified: Secondary | ICD-10-CM | POA: Diagnosis not present

## 2019-04-21 DIAGNOSIS — E440 Moderate protein-calorie malnutrition: Secondary | ICD-10-CM | POA: Diagnosis not present

## 2019-04-21 DIAGNOSIS — N2581 Secondary hyperparathyroidism of renal origin: Secondary | ICD-10-CM | POA: Diagnosis not present

## 2019-04-21 DIAGNOSIS — Z992 Dependence on renal dialysis: Secondary | ICD-10-CM | POA: Diagnosis not present

## 2019-04-22 DIAGNOSIS — Z992 Dependence on renal dialysis: Secondary | ICD-10-CM | POA: Diagnosis not present

## 2019-04-22 DIAGNOSIS — D631 Anemia in chronic kidney disease: Secondary | ICD-10-CM | POA: Diagnosis not present

## 2019-04-22 DIAGNOSIS — Z4932 Encounter for adequacy testing for peritoneal dialysis: Secondary | ICD-10-CM | POA: Diagnosis not present

## 2019-04-22 DIAGNOSIS — N186 End stage renal disease: Secondary | ICD-10-CM | POA: Diagnosis not present

## 2019-04-22 DIAGNOSIS — D509 Iron deficiency anemia, unspecified: Secondary | ICD-10-CM | POA: Diagnosis not present

## 2019-04-22 DIAGNOSIS — E440 Moderate protein-calorie malnutrition: Secondary | ICD-10-CM | POA: Diagnosis not present

## 2019-04-22 DIAGNOSIS — Z79899 Other long term (current) drug therapy: Secondary | ICD-10-CM | POA: Diagnosis not present

## 2019-04-22 DIAGNOSIS — N2589 Other disorders resulting from impaired renal tubular function: Secondary | ICD-10-CM | POA: Diagnosis not present

## 2019-04-22 DIAGNOSIS — N2581 Secondary hyperparathyroidism of renal origin: Secondary | ICD-10-CM | POA: Diagnosis not present

## 2019-04-23 DIAGNOSIS — N2581 Secondary hyperparathyroidism of renal origin: Secondary | ICD-10-CM | POA: Diagnosis not present

## 2019-04-23 DIAGNOSIS — Z4932 Encounter for adequacy testing for peritoneal dialysis: Secondary | ICD-10-CM | POA: Diagnosis not present

## 2019-04-23 DIAGNOSIS — N2589 Other disorders resulting from impaired renal tubular function: Secondary | ICD-10-CM | POA: Diagnosis not present

## 2019-04-23 DIAGNOSIS — D631 Anemia in chronic kidney disease: Secondary | ICD-10-CM | POA: Diagnosis not present

## 2019-04-23 DIAGNOSIS — Z992 Dependence on renal dialysis: Secondary | ICD-10-CM | POA: Diagnosis not present

## 2019-04-23 DIAGNOSIS — Z79899 Other long term (current) drug therapy: Secondary | ICD-10-CM | POA: Diagnosis not present

## 2019-04-23 DIAGNOSIS — E440 Moderate protein-calorie malnutrition: Secondary | ICD-10-CM | POA: Diagnosis not present

## 2019-04-23 DIAGNOSIS — D509 Iron deficiency anemia, unspecified: Secondary | ICD-10-CM | POA: Diagnosis not present

## 2019-04-23 DIAGNOSIS — N186 End stage renal disease: Secondary | ICD-10-CM | POA: Diagnosis not present

## 2019-04-24 DIAGNOSIS — Z79899 Other long term (current) drug therapy: Secondary | ICD-10-CM | POA: Diagnosis not present

## 2019-04-24 DIAGNOSIS — N2581 Secondary hyperparathyroidism of renal origin: Secondary | ICD-10-CM | POA: Diagnosis not present

## 2019-04-24 DIAGNOSIS — N2589 Other disorders resulting from impaired renal tubular function: Secondary | ICD-10-CM | POA: Diagnosis not present

## 2019-04-24 DIAGNOSIS — D631 Anemia in chronic kidney disease: Secondary | ICD-10-CM | POA: Diagnosis not present

## 2019-04-24 DIAGNOSIS — Z4932 Encounter for adequacy testing for peritoneal dialysis: Secondary | ICD-10-CM | POA: Diagnosis not present

## 2019-04-24 DIAGNOSIS — Z992 Dependence on renal dialysis: Secondary | ICD-10-CM | POA: Diagnosis not present

## 2019-04-24 DIAGNOSIS — D509 Iron deficiency anemia, unspecified: Secondary | ICD-10-CM | POA: Diagnosis not present

## 2019-04-24 DIAGNOSIS — E440 Moderate protein-calorie malnutrition: Secondary | ICD-10-CM | POA: Diagnosis not present

## 2019-04-24 DIAGNOSIS — N186 End stage renal disease: Secondary | ICD-10-CM | POA: Diagnosis not present

## 2019-04-25 DIAGNOSIS — N2589 Other disorders resulting from impaired renal tubular function: Secondary | ICD-10-CM | POA: Diagnosis not present

## 2019-04-25 DIAGNOSIS — N2581 Secondary hyperparathyroidism of renal origin: Secondary | ICD-10-CM | POA: Diagnosis not present

## 2019-04-25 DIAGNOSIS — Z4932 Encounter for adequacy testing for peritoneal dialysis: Secondary | ICD-10-CM | POA: Diagnosis not present

## 2019-04-25 DIAGNOSIS — D509 Iron deficiency anemia, unspecified: Secondary | ICD-10-CM | POA: Diagnosis not present

## 2019-04-25 DIAGNOSIS — E440 Moderate protein-calorie malnutrition: Secondary | ICD-10-CM | POA: Diagnosis not present

## 2019-04-25 DIAGNOSIS — Z992 Dependence on renal dialysis: Secondary | ICD-10-CM | POA: Diagnosis not present

## 2019-04-25 DIAGNOSIS — D631 Anemia in chronic kidney disease: Secondary | ICD-10-CM | POA: Diagnosis not present

## 2019-04-25 DIAGNOSIS — Z79899 Other long term (current) drug therapy: Secondary | ICD-10-CM | POA: Diagnosis not present

## 2019-04-25 DIAGNOSIS — N186 End stage renal disease: Secondary | ICD-10-CM | POA: Diagnosis not present

## 2019-04-26 DIAGNOSIS — D631 Anemia in chronic kidney disease: Secondary | ICD-10-CM | POA: Diagnosis not present

## 2019-04-26 DIAGNOSIS — D509 Iron deficiency anemia, unspecified: Secondary | ICD-10-CM | POA: Diagnosis not present

## 2019-04-26 DIAGNOSIS — Z4932 Encounter for adequacy testing for peritoneal dialysis: Secondary | ICD-10-CM | POA: Diagnosis not present

## 2019-04-26 DIAGNOSIS — Z992 Dependence on renal dialysis: Secondary | ICD-10-CM | POA: Diagnosis not present

## 2019-04-26 DIAGNOSIS — Z79899 Other long term (current) drug therapy: Secondary | ICD-10-CM | POA: Diagnosis not present

## 2019-04-26 DIAGNOSIS — N186 End stage renal disease: Secondary | ICD-10-CM | POA: Diagnosis not present

## 2019-04-26 DIAGNOSIS — N2581 Secondary hyperparathyroidism of renal origin: Secondary | ICD-10-CM | POA: Diagnosis not present

## 2019-04-26 DIAGNOSIS — N2589 Other disorders resulting from impaired renal tubular function: Secondary | ICD-10-CM | POA: Diagnosis not present

## 2019-04-26 DIAGNOSIS — E440 Moderate protein-calorie malnutrition: Secondary | ICD-10-CM | POA: Diagnosis not present

## 2019-04-27 DIAGNOSIS — D509 Iron deficiency anemia, unspecified: Secondary | ICD-10-CM | POA: Diagnosis not present

## 2019-04-27 DIAGNOSIS — D631 Anemia in chronic kidney disease: Secondary | ICD-10-CM | POA: Diagnosis not present

## 2019-04-27 DIAGNOSIS — Z992 Dependence on renal dialysis: Secondary | ICD-10-CM | POA: Diagnosis not present

## 2019-04-27 DIAGNOSIS — N2589 Other disorders resulting from impaired renal tubular function: Secondary | ICD-10-CM | POA: Diagnosis not present

## 2019-04-27 DIAGNOSIS — Z79899 Other long term (current) drug therapy: Secondary | ICD-10-CM | POA: Diagnosis not present

## 2019-04-27 DIAGNOSIS — Z4932 Encounter for adequacy testing for peritoneal dialysis: Secondary | ICD-10-CM | POA: Diagnosis not present

## 2019-04-27 DIAGNOSIS — N186 End stage renal disease: Secondary | ICD-10-CM | POA: Diagnosis not present

## 2019-04-27 DIAGNOSIS — E440 Moderate protein-calorie malnutrition: Secondary | ICD-10-CM | POA: Diagnosis not present

## 2019-04-27 DIAGNOSIS — N2581 Secondary hyperparathyroidism of renal origin: Secondary | ICD-10-CM | POA: Diagnosis not present

## 2019-04-28 DIAGNOSIS — Z79899 Other long term (current) drug therapy: Secondary | ICD-10-CM | POA: Diagnosis not present

## 2019-04-28 DIAGNOSIS — N186 End stage renal disease: Secondary | ICD-10-CM | POA: Diagnosis not present

## 2019-04-28 DIAGNOSIS — Z4932 Encounter for adequacy testing for peritoneal dialysis: Secondary | ICD-10-CM | POA: Diagnosis not present

## 2019-04-28 DIAGNOSIS — N2589 Other disorders resulting from impaired renal tubular function: Secondary | ICD-10-CM | POA: Diagnosis not present

## 2019-04-28 DIAGNOSIS — D631 Anemia in chronic kidney disease: Secondary | ICD-10-CM | POA: Diagnosis not present

## 2019-04-28 DIAGNOSIS — E440 Moderate protein-calorie malnutrition: Secondary | ICD-10-CM | POA: Diagnosis not present

## 2019-04-28 DIAGNOSIS — Z992 Dependence on renal dialysis: Secondary | ICD-10-CM | POA: Diagnosis not present

## 2019-04-28 DIAGNOSIS — D509 Iron deficiency anemia, unspecified: Secondary | ICD-10-CM | POA: Diagnosis not present

## 2019-04-28 DIAGNOSIS — N2581 Secondary hyperparathyroidism of renal origin: Secondary | ICD-10-CM | POA: Diagnosis not present

## 2019-04-29 DIAGNOSIS — D509 Iron deficiency anemia, unspecified: Secondary | ICD-10-CM | POA: Diagnosis not present

## 2019-04-29 DIAGNOSIS — E440 Moderate protein-calorie malnutrition: Secondary | ICD-10-CM | POA: Diagnosis not present

## 2019-04-29 DIAGNOSIS — N186 End stage renal disease: Secondary | ICD-10-CM | POA: Diagnosis not present

## 2019-04-29 DIAGNOSIS — Z992 Dependence on renal dialysis: Secondary | ICD-10-CM | POA: Diagnosis not present

## 2019-04-29 DIAGNOSIS — Z4932 Encounter for adequacy testing for peritoneal dialysis: Secondary | ICD-10-CM | POA: Diagnosis not present

## 2019-04-29 DIAGNOSIS — D631 Anemia in chronic kidney disease: Secondary | ICD-10-CM | POA: Diagnosis not present

## 2019-04-29 DIAGNOSIS — N2589 Other disorders resulting from impaired renal tubular function: Secondary | ICD-10-CM | POA: Diagnosis not present

## 2019-04-29 DIAGNOSIS — N2581 Secondary hyperparathyroidism of renal origin: Secondary | ICD-10-CM | POA: Diagnosis not present

## 2019-04-29 DIAGNOSIS — Z79899 Other long term (current) drug therapy: Secondary | ICD-10-CM | POA: Diagnosis not present

## 2019-04-30 DIAGNOSIS — Z79899 Other long term (current) drug therapy: Secondary | ICD-10-CM | POA: Diagnosis not present

## 2019-04-30 DIAGNOSIS — E440 Moderate protein-calorie malnutrition: Secondary | ICD-10-CM | POA: Diagnosis not present

## 2019-04-30 DIAGNOSIS — D631 Anemia in chronic kidney disease: Secondary | ICD-10-CM | POA: Diagnosis not present

## 2019-04-30 DIAGNOSIS — N2589 Other disorders resulting from impaired renal tubular function: Secondary | ICD-10-CM | POA: Diagnosis not present

## 2019-04-30 DIAGNOSIS — Z4932 Encounter for adequacy testing for peritoneal dialysis: Secondary | ICD-10-CM | POA: Diagnosis not present

## 2019-04-30 DIAGNOSIS — D509 Iron deficiency anemia, unspecified: Secondary | ICD-10-CM | POA: Diagnosis not present

## 2019-04-30 DIAGNOSIS — N2581 Secondary hyperparathyroidism of renal origin: Secondary | ICD-10-CM | POA: Diagnosis not present

## 2019-04-30 DIAGNOSIS — N186 End stage renal disease: Secondary | ICD-10-CM | POA: Diagnosis not present

## 2019-04-30 DIAGNOSIS — Z992 Dependence on renal dialysis: Secondary | ICD-10-CM | POA: Diagnosis not present

## 2019-05-01 DIAGNOSIS — N186 End stage renal disease: Secondary | ICD-10-CM | POA: Diagnosis not present

## 2019-05-01 DIAGNOSIS — N2581 Secondary hyperparathyroidism of renal origin: Secondary | ICD-10-CM | POA: Diagnosis not present

## 2019-05-01 DIAGNOSIS — D631 Anemia in chronic kidney disease: Secondary | ICD-10-CM | POA: Diagnosis not present

## 2019-05-01 DIAGNOSIS — E440 Moderate protein-calorie malnutrition: Secondary | ICD-10-CM | POA: Diagnosis not present

## 2019-05-01 DIAGNOSIS — Z79899 Other long term (current) drug therapy: Secondary | ICD-10-CM | POA: Diagnosis not present

## 2019-05-01 DIAGNOSIS — Z992 Dependence on renal dialysis: Secondary | ICD-10-CM | POA: Diagnosis not present

## 2019-05-01 DIAGNOSIS — Z4932 Encounter for adequacy testing for peritoneal dialysis: Secondary | ICD-10-CM | POA: Diagnosis not present

## 2019-05-01 DIAGNOSIS — D509 Iron deficiency anemia, unspecified: Secondary | ICD-10-CM | POA: Diagnosis not present

## 2019-05-01 DIAGNOSIS — N2589 Other disorders resulting from impaired renal tubular function: Secondary | ICD-10-CM | POA: Diagnosis not present

## 2019-05-02 DIAGNOSIS — Z992 Dependence on renal dialysis: Secondary | ICD-10-CM | POA: Diagnosis not present

## 2019-05-02 DIAGNOSIS — E440 Moderate protein-calorie malnutrition: Secondary | ICD-10-CM | POA: Diagnosis not present

## 2019-05-02 DIAGNOSIS — N2581 Secondary hyperparathyroidism of renal origin: Secondary | ICD-10-CM | POA: Diagnosis not present

## 2019-05-02 DIAGNOSIS — D509 Iron deficiency anemia, unspecified: Secondary | ICD-10-CM | POA: Diagnosis not present

## 2019-05-02 DIAGNOSIS — Z79899 Other long term (current) drug therapy: Secondary | ICD-10-CM | POA: Diagnosis not present

## 2019-05-02 DIAGNOSIS — D631 Anemia in chronic kidney disease: Secondary | ICD-10-CM | POA: Diagnosis not present

## 2019-05-02 DIAGNOSIS — N2589 Other disorders resulting from impaired renal tubular function: Secondary | ICD-10-CM | POA: Diagnosis not present

## 2019-05-02 DIAGNOSIS — Z4932 Encounter for adequacy testing for peritoneal dialysis: Secondary | ICD-10-CM | POA: Diagnosis not present

## 2019-05-02 DIAGNOSIS — N186 End stage renal disease: Secondary | ICD-10-CM | POA: Diagnosis not present

## 2019-05-03 DIAGNOSIS — N2589 Other disorders resulting from impaired renal tubular function: Secondary | ICD-10-CM | POA: Diagnosis not present

## 2019-05-03 DIAGNOSIS — N2581 Secondary hyperparathyroidism of renal origin: Secondary | ICD-10-CM | POA: Diagnosis not present

## 2019-05-03 DIAGNOSIS — Z79899 Other long term (current) drug therapy: Secondary | ICD-10-CM | POA: Diagnosis not present

## 2019-05-03 DIAGNOSIS — Z4932 Encounter for adequacy testing for peritoneal dialysis: Secondary | ICD-10-CM | POA: Diagnosis not present

## 2019-05-03 DIAGNOSIS — D631 Anemia in chronic kidney disease: Secondary | ICD-10-CM | POA: Diagnosis not present

## 2019-05-03 DIAGNOSIS — E440 Moderate protein-calorie malnutrition: Secondary | ICD-10-CM | POA: Diagnosis not present

## 2019-05-03 DIAGNOSIS — N186 End stage renal disease: Secondary | ICD-10-CM | POA: Diagnosis not present

## 2019-05-03 DIAGNOSIS — Z992 Dependence on renal dialysis: Secondary | ICD-10-CM | POA: Diagnosis not present

## 2019-05-03 DIAGNOSIS — D509 Iron deficiency anemia, unspecified: Secondary | ICD-10-CM | POA: Diagnosis not present

## 2019-05-04 ENCOUNTER — Ambulatory Visit (HOSPITAL_COMMUNITY)
Admission: EM | Admit: 2019-05-04 | Discharge: 2019-05-04 | Disposition: A | Discharge status: Home / Self Care | Payer: Medicare Other

## 2019-05-04 ENCOUNTER — Other Ambulatory Visit: Payer: Self-pay

## 2019-05-04 DIAGNOSIS — Z4932 Encounter for adequacy testing for peritoneal dialysis: Secondary | ICD-10-CM | POA: Diagnosis not present

## 2019-05-04 DIAGNOSIS — Z79899 Other long term (current) drug therapy: Secondary | ICD-10-CM | POA: Diagnosis not present

## 2019-05-04 DIAGNOSIS — D631 Anemia in chronic kidney disease: Secondary | ICD-10-CM | POA: Diagnosis not present

## 2019-05-04 DIAGNOSIS — E440 Moderate protein-calorie malnutrition: Secondary | ICD-10-CM | POA: Diagnosis not present

## 2019-05-04 DIAGNOSIS — N2581 Secondary hyperparathyroidism of renal origin: Secondary | ICD-10-CM | POA: Diagnosis not present

## 2019-05-04 DIAGNOSIS — N186 End stage renal disease: Secondary | ICD-10-CM | POA: Diagnosis not present

## 2019-05-04 DIAGNOSIS — Z992 Dependence on renal dialysis: Secondary | ICD-10-CM | POA: Diagnosis not present

## 2019-05-04 DIAGNOSIS — D509 Iron deficiency anemia, unspecified: Secondary | ICD-10-CM | POA: Diagnosis not present

## 2019-05-04 DIAGNOSIS — N2589 Other disorders resulting from impaired renal tubular function: Secondary | ICD-10-CM | POA: Diagnosis not present

## 2019-05-04 NOTE — ED Notes (Signed)
Patient decided he didn't want to wait any longer.

## 2019-05-05 DIAGNOSIS — Z4932 Encounter for adequacy testing for peritoneal dialysis: Secondary | ICD-10-CM | POA: Diagnosis not present

## 2019-05-05 DIAGNOSIS — N2589 Other disorders resulting from impaired renal tubular function: Secondary | ICD-10-CM | POA: Diagnosis not present

## 2019-05-05 DIAGNOSIS — N2581 Secondary hyperparathyroidism of renal origin: Secondary | ICD-10-CM | POA: Diagnosis not present

## 2019-05-05 DIAGNOSIS — Z79899 Other long term (current) drug therapy: Secondary | ICD-10-CM | POA: Diagnosis not present

## 2019-05-05 DIAGNOSIS — N186 End stage renal disease: Secondary | ICD-10-CM | POA: Diagnosis not present

## 2019-05-05 DIAGNOSIS — D631 Anemia in chronic kidney disease: Secondary | ICD-10-CM | POA: Diagnosis not present

## 2019-05-05 DIAGNOSIS — D509 Iron deficiency anemia, unspecified: Secondary | ICD-10-CM | POA: Diagnosis not present

## 2019-05-05 DIAGNOSIS — E440 Moderate protein-calorie malnutrition: Secondary | ICD-10-CM | POA: Diagnosis not present

## 2019-05-05 DIAGNOSIS — Z992 Dependence on renal dialysis: Secondary | ICD-10-CM | POA: Diagnosis not present

## 2019-05-06 DIAGNOSIS — D509 Iron deficiency anemia, unspecified: Secondary | ICD-10-CM | POA: Diagnosis not present

## 2019-05-06 DIAGNOSIS — N2589 Other disorders resulting from impaired renal tubular function: Secondary | ICD-10-CM | POA: Diagnosis not present

## 2019-05-06 DIAGNOSIS — Z4932 Encounter for adequacy testing for peritoneal dialysis: Secondary | ICD-10-CM | POA: Diagnosis not present

## 2019-05-06 DIAGNOSIS — Z79899 Other long term (current) drug therapy: Secondary | ICD-10-CM | POA: Diagnosis not present

## 2019-05-06 DIAGNOSIS — Z992 Dependence on renal dialysis: Secondary | ICD-10-CM | POA: Diagnosis not present

## 2019-05-06 DIAGNOSIS — N186 End stage renal disease: Secondary | ICD-10-CM | POA: Diagnosis not present

## 2019-05-06 DIAGNOSIS — N2581 Secondary hyperparathyroidism of renal origin: Secondary | ICD-10-CM | POA: Diagnosis not present

## 2019-05-06 DIAGNOSIS — E440 Moderate protein-calorie malnutrition: Secondary | ICD-10-CM | POA: Diagnosis not present

## 2019-05-06 DIAGNOSIS — D631 Anemia in chronic kidney disease: Secondary | ICD-10-CM | POA: Diagnosis not present

## 2019-05-07 DIAGNOSIS — N186 End stage renal disease: Secondary | ICD-10-CM | POA: Diagnosis not present

## 2019-05-07 DIAGNOSIS — N2589 Other disorders resulting from impaired renal tubular function: Secondary | ICD-10-CM | POA: Diagnosis not present

## 2019-05-07 DIAGNOSIS — D509 Iron deficiency anemia, unspecified: Secondary | ICD-10-CM | POA: Diagnosis not present

## 2019-05-07 DIAGNOSIS — Z992 Dependence on renal dialysis: Secondary | ICD-10-CM | POA: Diagnosis not present

## 2019-05-07 DIAGNOSIS — D631 Anemia in chronic kidney disease: Secondary | ICD-10-CM | POA: Diagnosis not present

## 2019-05-07 DIAGNOSIS — E440 Moderate protein-calorie malnutrition: Secondary | ICD-10-CM | POA: Diagnosis not present

## 2019-05-07 DIAGNOSIS — Z4932 Encounter for adequacy testing for peritoneal dialysis: Secondary | ICD-10-CM | POA: Diagnosis not present

## 2019-05-07 DIAGNOSIS — Z79899 Other long term (current) drug therapy: Secondary | ICD-10-CM | POA: Diagnosis not present

## 2019-05-07 DIAGNOSIS — N2581 Secondary hyperparathyroidism of renal origin: Secondary | ICD-10-CM | POA: Diagnosis not present

## 2019-05-08 DIAGNOSIS — Z992 Dependence on renal dialysis: Secondary | ICD-10-CM | POA: Diagnosis not present

## 2019-05-08 DIAGNOSIS — N2589 Other disorders resulting from impaired renal tubular function: Secondary | ICD-10-CM | POA: Diagnosis not present

## 2019-05-08 DIAGNOSIS — E440 Moderate protein-calorie malnutrition: Secondary | ICD-10-CM | POA: Diagnosis not present

## 2019-05-08 DIAGNOSIS — I129 Hypertensive chronic kidney disease with stage 1 through stage 4 chronic kidney disease, or unspecified chronic kidney disease: Secondary | ICD-10-CM | POA: Diagnosis not present

## 2019-05-08 DIAGNOSIS — D509 Iron deficiency anemia, unspecified: Secondary | ICD-10-CM | POA: Diagnosis not present

## 2019-05-08 DIAGNOSIS — N2581 Secondary hyperparathyroidism of renal origin: Secondary | ICD-10-CM | POA: Diagnosis not present

## 2019-05-08 DIAGNOSIS — D631 Anemia in chronic kidney disease: Secondary | ICD-10-CM | POA: Diagnosis not present

## 2019-05-08 DIAGNOSIS — Z4932 Encounter for adequacy testing for peritoneal dialysis: Secondary | ICD-10-CM | POA: Diagnosis not present

## 2019-05-08 DIAGNOSIS — Z79899 Other long term (current) drug therapy: Secondary | ICD-10-CM | POA: Diagnosis not present

## 2019-05-08 DIAGNOSIS — N186 End stage renal disease: Secondary | ICD-10-CM | POA: Diagnosis not present

## 2019-05-09 DIAGNOSIS — E7841 Elevated Lipoprotein(a): Secondary | ICD-10-CM | POA: Diagnosis not present

## 2019-05-09 DIAGNOSIS — N2589 Other disorders resulting from impaired renal tubular function: Secondary | ICD-10-CM | POA: Diagnosis not present

## 2019-05-09 DIAGNOSIS — N186 End stage renal disease: Secondary | ICD-10-CM | POA: Diagnosis not present

## 2019-05-09 DIAGNOSIS — E440 Moderate protein-calorie malnutrition: Secondary | ICD-10-CM | POA: Diagnosis not present

## 2019-05-09 DIAGNOSIS — Z4932 Encounter for adequacy testing for peritoneal dialysis: Secondary | ICD-10-CM | POA: Diagnosis not present

## 2019-05-09 DIAGNOSIS — Z992 Dependence on renal dialysis: Secondary | ICD-10-CM | POA: Diagnosis not present

## 2019-05-09 DIAGNOSIS — Z79899 Other long term (current) drug therapy: Secondary | ICD-10-CM | POA: Diagnosis not present

## 2019-05-09 DIAGNOSIS — N2581 Secondary hyperparathyroidism of renal origin: Secondary | ICD-10-CM | POA: Diagnosis not present

## 2019-05-09 DIAGNOSIS — D631 Anemia in chronic kidney disease: Secondary | ICD-10-CM | POA: Diagnosis not present

## 2019-05-09 DIAGNOSIS — D509 Iron deficiency anemia, unspecified: Secondary | ICD-10-CM | POA: Diagnosis not present

## 2019-05-10 DIAGNOSIS — D631 Anemia in chronic kidney disease: Secondary | ICD-10-CM | POA: Diagnosis not present

## 2019-05-10 DIAGNOSIS — N2589 Other disorders resulting from impaired renal tubular function: Secondary | ICD-10-CM | POA: Diagnosis not present

## 2019-05-10 DIAGNOSIS — N2581 Secondary hyperparathyroidism of renal origin: Secondary | ICD-10-CM | POA: Diagnosis not present

## 2019-05-10 DIAGNOSIS — N186 End stage renal disease: Secondary | ICD-10-CM | POA: Diagnosis not present

## 2019-05-10 DIAGNOSIS — E7841 Elevated Lipoprotein(a): Secondary | ICD-10-CM | POA: Diagnosis not present

## 2019-05-10 DIAGNOSIS — Z992 Dependence on renal dialysis: Secondary | ICD-10-CM | POA: Diagnosis not present

## 2019-05-10 DIAGNOSIS — Z4932 Encounter for adequacy testing for peritoneal dialysis: Secondary | ICD-10-CM | POA: Diagnosis not present

## 2019-05-10 DIAGNOSIS — E440 Moderate protein-calorie malnutrition: Secondary | ICD-10-CM | POA: Diagnosis not present

## 2019-05-10 DIAGNOSIS — D509 Iron deficiency anemia, unspecified: Secondary | ICD-10-CM | POA: Diagnosis not present

## 2019-05-10 DIAGNOSIS — Z79899 Other long term (current) drug therapy: Secondary | ICD-10-CM | POA: Diagnosis not present

## 2019-05-11 DIAGNOSIS — N186 End stage renal disease: Secondary | ICD-10-CM | POA: Diagnosis not present

## 2019-05-11 DIAGNOSIS — N2581 Secondary hyperparathyroidism of renal origin: Secondary | ICD-10-CM | POA: Diagnosis not present

## 2019-05-11 DIAGNOSIS — E7841 Elevated Lipoprotein(a): Secondary | ICD-10-CM | POA: Diagnosis not present

## 2019-05-11 DIAGNOSIS — N2589 Other disorders resulting from impaired renal tubular function: Secondary | ICD-10-CM | POA: Diagnosis not present

## 2019-05-11 DIAGNOSIS — Z79899 Other long term (current) drug therapy: Secondary | ICD-10-CM | POA: Diagnosis not present

## 2019-05-11 DIAGNOSIS — Z992 Dependence on renal dialysis: Secondary | ICD-10-CM | POA: Diagnosis not present

## 2019-05-11 DIAGNOSIS — Z4932 Encounter for adequacy testing for peritoneal dialysis: Secondary | ICD-10-CM | POA: Diagnosis not present

## 2019-05-11 DIAGNOSIS — D509 Iron deficiency anemia, unspecified: Secondary | ICD-10-CM | POA: Diagnosis not present

## 2019-05-11 DIAGNOSIS — D631 Anemia in chronic kidney disease: Secondary | ICD-10-CM | POA: Diagnosis not present

## 2019-05-11 DIAGNOSIS — E440 Moderate protein-calorie malnutrition: Secondary | ICD-10-CM | POA: Diagnosis not present

## 2019-05-12 DIAGNOSIS — N186 End stage renal disease: Secondary | ICD-10-CM | POA: Diagnosis not present

## 2019-05-12 DIAGNOSIS — D509 Iron deficiency anemia, unspecified: Secondary | ICD-10-CM | POA: Diagnosis not present

## 2019-05-12 DIAGNOSIS — Z992 Dependence on renal dialysis: Secondary | ICD-10-CM | POA: Diagnosis not present

## 2019-05-12 DIAGNOSIS — N2581 Secondary hyperparathyroidism of renal origin: Secondary | ICD-10-CM | POA: Diagnosis not present

## 2019-05-12 DIAGNOSIS — E440 Moderate protein-calorie malnutrition: Secondary | ICD-10-CM | POA: Diagnosis not present

## 2019-05-12 DIAGNOSIS — Z79899 Other long term (current) drug therapy: Secondary | ICD-10-CM | POA: Diagnosis not present

## 2019-05-12 DIAGNOSIS — Z4932 Encounter for adequacy testing for peritoneal dialysis: Secondary | ICD-10-CM | POA: Diagnosis not present

## 2019-05-12 DIAGNOSIS — N2589 Other disorders resulting from impaired renal tubular function: Secondary | ICD-10-CM | POA: Diagnosis not present

## 2019-05-12 DIAGNOSIS — E7841 Elevated Lipoprotein(a): Secondary | ICD-10-CM | POA: Diagnosis not present

## 2019-05-12 DIAGNOSIS — D631 Anemia in chronic kidney disease: Secondary | ICD-10-CM | POA: Diagnosis not present

## 2019-05-13 DIAGNOSIS — E440 Moderate protein-calorie malnutrition: Secondary | ICD-10-CM | POA: Diagnosis not present

## 2019-05-13 DIAGNOSIS — Z992 Dependence on renal dialysis: Secondary | ICD-10-CM | POA: Diagnosis not present

## 2019-05-13 DIAGNOSIS — D631 Anemia in chronic kidney disease: Secondary | ICD-10-CM | POA: Diagnosis not present

## 2019-05-13 DIAGNOSIS — N186 End stage renal disease: Secondary | ICD-10-CM | POA: Diagnosis not present

## 2019-05-13 DIAGNOSIS — Z4932 Encounter for adequacy testing for peritoneal dialysis: Secondary | ICD-10-CM | POA: Diagnosis not present

## 2019-05-13 DIAGNOSIS — N2589 Other disorders resulting from impaired renal tubular function: Secondary | ICD-10-CM | POA: Diagnosis not present

## 2019-05-13 DIAGNOSIS — N2581 Secondary hyperparathyroidism of renal origin: Secondary | ICD-10-CM | POA: Diagnosis not present

## 2019-05-13 DIAGNOSIS — D509 Iron deficiency anemia, unspecified: Secondary | ICD-10-CM | POA: Diagnosis not present

## 2019-05-13 DIAGNOSIS — Z79899 Other long term (current) drug therapy: Secondary | ICD-10-CM | POA: Diagnosis not present

## 2019-05-13 DIAGNOSIS — E7841 Elevated Lipoprotein(a): Secondary | ICD-10-CM | POA: Diagnosis not present

## 2019-05-14 DIAGNOSIS — N2589 Other disorders resulting from impaired renal tubular function: Secondary | ICD-10-CM | POA: Diagnosis not present

## 2019-05-14 DIAGNOSIS — N2581 Secondary hyperparathyroidism of renal origin: Secondary | ICD-10-CM | POA: Diagnosis not present

## 2019-05-14 DIAGNOSIS — E7841 Elevated Lipoprotein(a): Secondary | ICD-10-CM | POA: Diagnosis not present

## 2019-05-14 DIAGNOSIS — Z992 Dependence on renal dialysis: Secondary | ICD-10-CM | POA: Diagnosis not present

## 2019-05-14 DIAGNOSIS — E440 Moderate protein-calorie malnutrition: Secondary | ICD-10-CM | POA: Diagnosis not present

## 2019-05-14 DIAGNOSIS — N186 End stage renal disease: Secondary | ICD-10-CM | POA: Diagnosis not present

## 2019-05-14 DIAGNOSIS — D509 Iron deficiency anemia, unspecified: Secondary | ICD-10-CM | POA: Diagnosis not present

## 2019-05-14 DIAGNOSIS — Z79899 Other long term (current) drug therapy: Secondary | ICD-10-CM | POA: Diagnosis not present

## 2019-05-14 DIAGNOSIS — Z4932 Encounter for adequacy testing for peritoneal dialysis: Secondary | ICD-10-CM | POA: Diagnosis not present

## 2019-05-14 DIAGNOSIS — D631 Anemia in chronic kidney disease: Secondary | ICD-10-CM | POA: Diagnosis not present

## 2019-05-15 DIAGNOSIS — Z79899 Other long term (current) drug therapy: Secondary | ICD-10-CM | POA: Diagnosis not present

## 2019-05-15 DIAGNOSIS — N2581 Secondary hyperparathyroidism of renal origin: Secondary | ICD-10-CM | POA: Diagnosis not present

## 2019-05-15 DIAGNOSIS — D631 Anemia in chronic kidney disease: Secondary | ICD-10-CM | POA: Diagnosis not present

## 2019-05-15 DIAGNOSIS — D509 Iron deficiency anemia, unspecified: Secondary | ICD-10-CM | POA: Diagnosis not present

## 2019-05-15 DIAGNOSIS — Z992 Dependence on renal dialysis: Secondary | ICD-10-CM | POA: Diagnosis not present

## 2019-05-15 DIAGNOSIS — E440 Moderate protein-calorie malnutrition: Secondary | ICD-10-CM | POA: Diagnosis not present

## 2019-05-15 DIAGNOSIS — N2589 Other disorders resulting from impaired renal tubular function: Secondary | ICD-10-CM | POA: Diagnosis not present

## 2019-05-15 DIAGNOSIS — N186 End stage renal disease: Secondary | ICD-10-CM | POA: Diagnosis not present

## 2019-05-15 DIAGNOSIS — E7841 Elevated Lipoprotein(a): Secondary | ICD-10-CM | POA: Diagnosis not present

## 2019-05-15 DIAGNOSIS — Z4932 Encounter for adequacy testing for peritoneal dialysis: Secondary | ICD-10-CM | POA: Diagnosis not present

## 2019-05-16 DIAGNOSIS — Z4932 Encounter for adequacy testing for peritoneal dialysis: Secondary | ICD-10-CM | POA: Diagnosis not present

## 2019-05-16 DIAGNOSIS — N186 End stage renal disease: Secondary | ICD-10-CM | POA: Diagnosis not present

## 2019-05-16 DIAGNOSIS — E7841 Elevated Lipoprotein(a): Secondary | ICD-10-CM | POA: Diagnosis not present

## 2019-05-16 DIAGNOSIS — Z79899 Other long term (current) drug therapy: Secondary | ICD-10-CM | POA: Diagnosis not present

## 2019-05-16 DIAGNOSIS — D509 Iron deficiency anemia, unspecified: Secondary | ICD-10-CM | POA: Diagnosis not present

## 2019-05-16 DIAGNOSIS — E440 Moderate protein-calorie malnutrition: Secondary | ICD-10-CM | POA: Diagnosis not present

## 2019-05-16 DIAGNOSIS — D631 Anemia in chronic kidney disease: Secondary | ICD-10-CM | POA: Diagnosis not present

## 2019-05-16 DIAGNOSIS — N2581 Secondary hyperparathyroidism of renal origin: Secondary | ICD-10-CM | POA: Diagnosis not present

## 2019-05-16 DIAGNOSIS — Z992 Dependence on renal dialysis: Secondary | ICD-10-CM | POA: Diagnosis not present

## 2019-05-16 DIAGNOSIS — N2589 Other disorders resulting from impaired renal tubular function: Secondary | ICD-10-CM | POA: Diagnosis not present

## 2019-05-17 DIAGNOSIS — N2581 Secondary hyperparathyroidism of renal origin: Secondary | ICD-10-CM | POA: Diagnosis not present

## 2019-05-17 DIAGNOSIS — D631 Anemia in chronic kidney disease: Secondary | ICD-10-CM | POA: Diagnosis not present

## 2019-05-17 DIAGNOSIS — N2589 Other disorders resulting from impaired renal tubular function: Secondary | ICD-10-CM | POA: Diagnosis not present

## 2019-05-17 DIAGNOSIS — D509 Iron deficiency anemia, unspecified: Secondary | ICD-10-CM | POA: Diagnosis not present

## 2019-05-17 DIAGNOSIS — E440 Moderate protein-calorie malnutrition: Secondary | ICD-10-CM | POA: Diagnosis not present

## 2019-05-17 DIAGNOSIS — E7841 Elevated Lipoprotein(a): Secondary | ICD-10-CM | POA: Diagnosis not present

## 2019-05-17 DIAGNOSIS — Z79899 Other long term (current) drug therapy: Secondary | ICD-10-CM | POA: Diagnosis not present

## 2019-05-17 DIAGNOSIS — N186 End stage renal disease: Secondary | ICD-10-CM | POA: Diagnosis not present

## 2019-05-17 DIAGNOSIS — Z4932 Encounter for adequacy testing for peritoneal dialysis: Secondary | ICD-10-CM | POA: Diagnosis not present

## 2019-05-17 DIAGNOSIS — Z992 Dependence on renal dialysis: Secondary | ICD-10-CM | POA: Diagnosis not present

## 2019-05-18 DIAGNOSIS — D631 Anemia in chronic kidney disease: Secondary | ICD-10-CM | POA: Diagnosis not present

## 2019-05-18 DIAGNOSIS — N186 End stage renal disease: Secondary | ICD-10-CM | POA: Diagnosis not present

## 2019-05-18 DIAGNOSIS — E7841 Elevated Lipoprotein(a): Secondary | ICD-10-CM | POA: Diagnosis not present

## 2019-05-18 DIAGNOSIS — Z992 Dependence on renal dialysis: Secondary | ICD-10-CM | POA: Diagnosis not present

## 2019-05-18 DIAGNOSIS — Z79899 Other long term (current) drug therapy: Secondary | ICD-10-CM | POA: Diagnosis not present

## 2019-05-18 DIAGNOSIS — Z4932 Encounter for adequacy testing for peritoneal dialysis: Secondary | ICD-10-CM | POA: Diagnosis not present

## 2019-05-18 DIAGNOSIS — E440 Moderate protein-calorie malnutrition: Secondary | ICD-10-CM | POA: Diagnosis not present

## 2019-05-18 DIAGNOSIS — D509 Iron deficiency anemia, unspecified: Secondary | ICD-10-CM | POA: Diagnosis not present

## 2019-05-18 DIAGNOSIS — N2581 Secondary hyperparathyroidism of renal origin: Secondary | ICD-10-CM | POA: Diagnosis not present

## 2019-05-18 DIAGNOSIS — N2589 Other disorders resulting from impaired renal tubular function: Secondary | ICD-10-CM | POA: Diagnosis not present

## 2019-05-19 DIAGNOSIS — N2589 Other disorders resulting from impaired renal tubular function: Secondary | ICD-10-CM | POA: Diagnosis not present

## 2019-05-19 DIAGNOSIS — D631 Anemia in chronic kidney disease: Secondary | ICD-10-CM | POA: Diagnosis not present

## 2019-05-19 DIAGNOSIS — N186 End stage renal disease: Secondary | ICD-10-CM | POA: Diagnosis not present

## 2019-05-19 DIAGNOSIS — Z992 Dependence on renal dialysis: Secondary | ICD-10-CM | POA: Diagnosis not present

## 2019-05-19 DIAGNOSIS — D509 Iron deficiency anemia, unspecified: Secondary | ICD-10-CM | POA: Diagnosis not present

## 2019-05-19 DIAGNOSIS — E7841 Elevated Lipoprotein(a): Secondary | ICD-10-CM | POA: Diagnosis not present

## 2019-05-19 DIAGNOSIS — N2581 Secondary hyperparathyroidism of renal origin: Secondary | ICD-10-CM | POA: Diagnosis not present

## 2019-05-19 DIAGNOSIS — Z79899 Other long term (current) drug therapy: Secondary | ICD-10-CM | POA: Diagnosis not present

## 2019-05-19 DIAGNOSIS — Z4932 Encounter for adequacy testing for peritoneal dialysis: Secondary | ICD-10-CM | POA: Diagnosis not present

## 2019-05-19 DIAGNOSIS — E440 Moderate protein-calorie malnutrition: Secondary | ICD-10-CM | POA: Diagnosis not present

## 2019-05-20 DIAGNOSIS — E440 Moderate protein-calorie malnutrition: Secondary | ICD-10-CM | POA: Diagnosis not present

## 2019-05-20 DIAGNOSIS — Z4932 Encounter for adequacy testing for peritoneal dialysis: Secondary | ICD-10-CM | POA: Diagnosis not present

## 2019-05-20 DIAGNOSIS — Z992 Dependence on renal dialysis: Secondary | ICD-10-CM | POA: Diagnosis not present

## 2019-05-20 DIAGNOSIS — D631 Anemia in chronic kidney disease: Secondary | ICD-10-CM | POA: Diagnosis not present

## 2019-05-20 DIAGNOSIS — Z79899 Other long term (current) drug therapy: Secondary | ICD-10-CM | POA: Diagnosis not present

## 2019-05-20 DIAGNOSIS — E7841 Elevated Lipoprotein(a): Secondary | ICD-10-CM | POA: Diagnosis not present

## 2019-05-20 DIAGNOSIS — D509 Iron deficiency anemia, unspecified: Secondary | ICD-10-CM | POA: Diagnosis not present

## 2019-05-20 DIAGNOSIS — N2581 Secondary hyperparathyroidism of renal origin: Secondary | ICD-10-CM | POA: Diagnosis not present

## 2019-05-20 DIAGNOSIS — N2589 Other disorders resulting from impaired renal tubular function: Secondary | ICD-10-CM | POA: Diagnosis not present

## 2019-05-20 DIAGNOSIS — N186 End stage renal disease: Secondary | ICD-10-CM | POA: Diagnosis not present

## 2019-05-21 DIAGNOSIS — Z992 Dependence on renal dialysis: Secondary | ICD-10-CM | POA: Diagnosis not present

## 2019-05-21 DIAGNOSIS — E7841 Elevated Lipoprotein(a): Secondary | ICD-10-CM | POA: Diagnosis not present

## 2019-05-21 DIAGNOSIS — N2581 Secondary hyperparathyroidism of renal origin: Secondary | ICD-10-CM | POA: Diagnosis not present

## 2019-05-21 DIAGNOSIS — Z79899 Other long term (current) drug therapy: Secondary | ICD-10-CM | POA: Diagnosis not present

## 2019-05-21 DIAGNOSIS — N186 End stage renal disease: Secondary | ICD-10-CM | POA: Diagnosis not present

## 2019-05-21 DIAGNOSIS — E440 Moderate protein-calorie malnutrition: Secondary | ICD-10-CM | POA: Diagnosis not present

## 2019-05-21 DIAGNOSIS — D509 Iron deficiency anemia, unspecified: Secondary | ICD-10-CM | POA: Diagnosis not present

## 2019-05-21 DIAGNOSIS — Z4932 Encounter for adequacy testing for peritoneal dialysis: Secondary | ICD-10-CM | POA: Diagnosis not present

## 2019-05-21 DIAGNOSIS — D631 Anemia in chronic kidney disease: Secondary | ICD-10-CM | POA: Diagnosis not present

## 2019-05-21 DIAGNOSIS — N2589 Other disorders resulting from impaired renal tubular function: Secondary | ICD-10-CM | POA: Diagnosis not present

## 2019-05-22 DIAGNOSIS — N186 End stage renal disease: Secondary | ICD-10-CM | POA: Diagnosis not present

## 2019-05-22 DIAGNOSIS — D509 Iron deficiency anemia, unspecified: Secondary | ICD-10-CM | POA: Diagnosis not present

## 2019-05-22 DIAGNOSIS — D631 Anemia in chronic kidney disease: Secondary | ICD-10-CM | POA: Diagnosis not present

## 2019-05-22 DIAGNOSIS — N2581 Secondary hyperparathyroidism of renal origin: Secondary | ICD-10-CM | POA: Diagnosis not present

## 2019-05-22 DIAGNOSIS — E7841 Elevated Lipoprotein(a): Secondary | ICD-10-CM | POA: Diagnosis not present

## 2019-05-22 DIAGNOSIS — E440 Moderate protein-calorie malnutrition: Secondary | ICD-10-CM | POA: Diagnosis not present

## 2019-05-22 DIAGNOSIS — Z79899 Other long term (current) drug therapy: Secondary | ICD-10-CM | POA: Diagnosis not present

## 2019-05-22 DIAGNOSIS — Z992 Dependence on renal dialysis: Secondary | ICD-10-CM | POA: Diagnosis not present

## 2019-05-22 DIAGNOSIS — Z4932 Encounter for adequacy testing for peritoneal dialysis: Secondary | ICD-10-CM | POA: Diagnosis not present

## 2019-05-22 DIAGNOSIS — N2589 Other disorders resulting from impaired renal tubular function: Secondary | ICD-10-CM | POA: Diagnosis not present

## 2019-05-23 DIAGNOSIS — E7841 Elevated Lipoprotein(a): Secondary | ICD-10-CM | POA: Diagnosis not present

## 2019-05-23 DIAGNOSIS — Z79899 Other long term (current) drug therapy: Secondary | ICD-10-CM | POA: Diagnosis not present

## 2019-05-23 DIAGNOSIS — D631 Anemia in chronic kidney disease: Secondary | ICD-10-CM | POA: Diagnosis not present

## 2019-05-23 DIAGNOSIS — N2581 Secondary hyperparathyroidism of renal origin: Secondary | ICD-10-CM | POA: Diagnosis not present

## 2019-05-23 DIAGNOSIS — N186 End stage renal disease: Secondary | ICD-10-CM | POA: Diagnosis not present

## 2019-05-23 DIAGNOSIS — N2589 Other disorders resulting from impaired renal tubular function: Secondary | ICD-10-CM | POA: Diagnosis not present

## 2019-05-23 DIAGNOSIS — Z992 Dependence on renal dialysis: Secondary | ICD-10-CM | POA: Diagnosis not present

## 2019-05-23 DIAGNOSIS — D509 Iron deficiency anemia, unspecified: Secondary | ICD-10-CM | POA: Diagnosis not present

## 2019-05-23 DIAGNOSIS — Z4932 Encounter for adequacy testing for peritoneal dialysis: Secondary | ICD-10-CM | POA: Diagnosis not present

## 2019-05-23 DIAGNOSIS — E440 Moderate protein-calorie malnutrition: Secondary | ICD-10-CM | POA: Diagnosis not present

## 2019-05-24 DIAGNOSIS — D631 Anemia in chronic kidney disease: Secondary | ICD-10-CM | POA: Diagnosis not present

## 2019-05-24 DIAGNOSIS — E7841 Elevated Lipoprotein(a): Secondary | ICD-10-CM | POA: Diagnosis not present

## 2019-05-24 DIAGNOSIS — Z992 Dependence on renal dialysis: Secondary | ICD-10-CM | POA: Diagnosis not present

## 2019-05-24 DIAGNOSIS — Z79899 Other long term (current) drug therapy: Secondary | ICD-10-CM | POA: Diagnosis not present

## 2019-05-24 DIAGNOSIS — N2581 Secondary hyperparathyroidism of renal origin: Secondary | ICD-10-CM | POA: Diagnosis not present

## 2019-05-24 DIAGNOSIS — E440 Moderate protein-calorie malnutrition: Secondary | ICD-10-CM | POA: Diagnosis not present

## 2019-05-24 DIAGNOSIS — N2589 Other disorders resulting from impaired renal tubular function: Secondary | ICD-10-CM | POA: Diagnosis not present

## 2019-05-24 DIAGNOSIS — D509 Iron deficiency anemia, unspecified: Secondary | ICD-10-CM | POA: Diagnosis not present

## 2019-05-24 DIAGNOSIS — N186 End stage renal disease: Secondary | ICD-10-CM | POA: Diagnosis not present

## 2019-05-24 DIAGNOSIS — Z4932 Encounter for adequacy testing for peritoneal dialysis: Secondary | ICD-10-CM | POA: Diagnosis not present

## 2019-05-25 DIAGNOSIS — N186 End stage renal disease: Secondary | ICD-10-CM | POA: Diagnosis not present

## 2019-05-25 DIAGNOSIS — N2589 Other disorders resulting from impaired renal tubular function: Secondary | ICD-10-CM | POA: Diagnosis not present

## 2019-05-25 DIAGNOSIS — E7841 Elevated Lipoprotein(a): Secondary | ICD-10-CM | POA: Diagnosis not present

## 2019-05-25 DIAGNOSIS — Z992 Dependence on renal dialysis: Secondary | ICD-10-CM | POA: Diagnosis not present

## 2019-05-25 DIAGNOSIS — Z4932 Encounter for adequacy testing for peritoneal dialysis: Secondary | ICD-10-CM | POA: Diagnosis not present

## 2019-05-25 DIAGNOSIS — N2581 Secondary hyperparathyroidism of renal origin: Secondary | ICD-10-CM | POA: Diagnosis not present

## 2019-05-25 DIAGNOSIS — Z79899 Other long term (current) drug therapy: Secondary | ICD-10-CM | POA: Diagnosis not present

## 2019-05-25 DIAGNOSIS — E440 Moderate protein-calorie malnutrition: Secondary | ICD-10-CM | POA: Diagnosis not present

## 2019-05-25 DIAGNOSIS — D509 Iron deficiency anemia, unspecified: Secondary | ICD-10-CM | POA: Diagnosis not present

## 2019-05-25 DIAGNOSIS — D631 Anemia in chronic kidney disease: Secondary | ICD-10-CM | POA: Diagnosis not present

## 2019-05-26 DIAGNOSIS — Z992 Dependence on renal dialysis: Secondary | ICD-10-CM | POA: Diagnosis not present

## 2019-05-26 DIAGNOSIS — D631 Anemia in chronic kidney disease: Secondary | ICD-10-CM | POA: Diagnosis not present

## 2019-05-26 DIAGNOSIS — E440 Moderate protein-calorie malnutrition: Secondary | ICD-10-CM | POA: Diagnosis not present

## 2019-05-26 DIAGNOSIS — N2581 Secondary hyperparathyroidism of renal origin: Secondary | ICD-10-CM | POA: Diagnosis not present

## 2019-05-26 DIAGNOSIS — Z79899 Other long term (current) drug therapy: Secondary | ICD-10-CM | POA: Diagnosis not present

## 2019-05-26 DIAGNOSIS — D509 Iron deficiency anemia, unspecified: Secondary | ICD-10-CM | POA: Diagnosis not present

## 2019-05-26 DIAGNOSIS — N186 End stage renal disease: Secondary | ICD-10-CM | POA: Diagnosis not present

## 2019-05-26 DIAGNOSIS — N2589 Other disorders resulting from impaired renal tubular function: Secondary | ICD-10-CM | POA: Diagnosis not present

## 2019-05-26 DIAGNOSIS — E7841 Elevated Lipoprotein(a): Secondary | ICD-10-CM | POA: Diagnosis not present

## 2019-05-26 DIAGNOSIS — Z4932 Encounter for adequacy testing for peritoneal dialysis: Secondary | ICD-10-CM | POA: Diagnosis not present

## 2019-05-27 DIAGNOSIS — E440 Moderate protein-calorie malnutrition: Secondary | ICD-10-CM | POA: Diagnosis not present

## 2019-05-27 DIAGNOSIS — Z4932 Encounter for adequacy testing for peritoneal dialysis: Secondary | ICD-10-CM | POA: Diagnosis not present

## 2019-05-27 DIAGNOSIS — Z79899 Other long term (current) drug therapy: Secondary | ICD-10-CM | POA: Diagnosis not present

## 2019-05-27 DIAGNOSIS — N2589 Other disorders resulting from impaired renal tubular function: Secondary | ICD-10-CM | POA: Diagnosis not present

## 2019-05-27 DIAGNOSIS — D631 Anemia in chronic kidney disease: Secondary | ICD-10-CM | POA: Diagnosis not present

## 2019-05-27 DIAGNOSIS — N186 End stage renal disease: Secondary | ICD-10-CM | POA: Diagnosis not present

## 2019-05-27 DIAGNOSIS — N2581 Secondary hyperparathyroidism of renal origin: Secondary | ICD-10-CM | POA: Diagnosis not present

## 2019-05-27 DIAGNOSIS — Z992 Dependence on renal dialysis: Secondary | ICD-10-CM | POA: Diagnosis not present

## 2019-05-27 DIAGNOSIS — D509 Iron deficiency anemia, unspecified: Secondary | ICD-10-CM | POA: Diagnosis not present

## 2019-05-27 DIAGNOSIS — E7841 Elevated Lipoprotein(a): Secondary | ICD-10-CM | POA: Diagnosis not present

## 2019-05-28 DIAGNOSIS — Z4932 Encounter for adequacy testing for peritoneal dialysis: Secondary | ICD-10-CM | POA: Diagnosis not present

## 2019-05-28 DIAGNOSIS — D631 Anemia in chronic kidney disease: Secondary | ICD-10-CM | POA: Diagnosis not present

## 2019-05-28 DIAGNOSIS — Z992 Dependence on renal dialysis: Secondary | ICD-10-CM | POA: Diagnosis not present

## 2019-05-28 DIAGNOSIS — N2581 Secondary hyperparathyroidism of renal origin: Secondary | ICD-10-CM | POA: Diagnosis not present

## 2019-05-28 DIAGNOSIS — N186 End stage renal disease: Secondary | ICD-10-CM | POA: Diagnosis not present

## 2019-05-28 DIAGNOSIS — Z79899 Other long term (current) drug therapy: Secondary | ICD-10-CM | POA: Diagnosis not present

## 2019-05-28 DIAGNOSIS — E440 Moderate protein-calorie malnutrition: Secondary | ICD-10-CM | POA: Diagnosis not present

## 2019-05-28 DIAGNOSIS — D509 Iron deficiency anemia, unspecified: Secondary | ICD-10-CM | POA: Diagnosis not present

## 2019-05-28 DIAGNOSIS — N2589 Other disorders resulting from impaired renal tubular function: Secondary | ICD-10-CM | POA: Diagnosis not present

## 2019-05-28 DIAGNOSIS — E7841 Elevated Lipoprotein(a): Secondary | ICD-10-CM | POA: Diagnosis not present

## 2019-05-29 DIAGNOSIS — E7841 Elevated Lipoprotein(a): Secondary | ICD-10-CM | POA: Diagnosis not present

## 2019-05-29 DIAGNOSIS — N2581 Secondary hyperparathyroidism of renal origin: Secondary | ICD-10-CM | POA: Diagnosis not present

## 2019-05-29 DIAGNOSIS — N186 End stage renal disease: Secondary | ICD-10-CM | POA: Diagnosis not present

## 2019-05-29 DIAGNOSIS — D631 Anemia in chronic kidney disease: Secondary | ICD-10-CM | POA: Diagnosis not present

## 2019-05-29 DIAGNOSIS — D509 Iron deficiency anemia, unspecified: Secondary | ICD-10-CM | POA: Diagnosis not present

## 2019-05-29 DIAGNOSIS — Z79899 Other long term (current) drug therapy: Secondary | ICD-10-CM | POA: Diagnosis not present

## 2019-05-29 DIAGNOSIS — Z4932 Encounter for adequacy testing for peritoneal dialysis: Secondary | ICD-10-CM | POA: Diagnosis not present

## 2019-05-29 DIAGNOSIS — N2589 Other disorders resulting from impaired renal tubular function: Secondary | ICD-10-CM | POA: Diagnosis not present

## 2019-05-29 DIAGNOSIS — Z992 Dependence on renal dialysis: Secondary | ICD-10-CM | POA: Diagnosis not present

## 2019-05-29 DIAGNOSIS — E440 Moderate protein-calorie malnutrition: Secondary | ICD-10-CM | POA: Diagnosis not present

## 2019-05-30 DIAGNOSIS — D631 Anemia in chronic kidney disease: Secondary | ICD-10-CM | POA: Diagnosis not present

## 2019-05-30 DIAGNOSIS — D509 Iron deficiency anemia, unspecified: Secondary | ICD-10-CM | POA: Diagnosis not present

## 2019-05-30 DIAGNOSIS — N2581 Secondary hyperparathyroidism of renal origin: Secondary | ICD-10-CM | POA: Diagnosis not present

## 2019-05-30 DIAGNOSIS — N2589 Other disorders resulting from impaired renal tubular function: Secondary | ICD-10-CM | POA: Diagnosis not present

## 2019-05-30 DIAGNOSIS — Z4932 Encounter for adequacy testing for peritoneal dialysis: Secondary | ICD-10-CM | POA: Diagnosis not present

## 2019-05-30 DIAGNOSIS — E440 Moderate protein-calorie malnutrition: Secondary | ICD-10-CM | POA: Diagnosis not present

## 2019-05-30 DIAGNOSIS — E7841 Elevated Lipoprotein(a): Secondary | ICD-10-CM | POA: Diagnosis not present

## 2019-05-30 DIAGNOSIS — N186 End stage renal disease: Secondary | ICD-10-CM | POA: Diagnosis not present

## 2019-05-30 DIAGNOSIS — Z79899 Other long term (current) drug therapy: Secondary | ICD-10-CM | POA: Diagnosis not present

## 2019-05-30 DIAGNOSIS — Z992 Dependence on renal dialysis: Secondary | ICD-10-CM | POA: Diagnosis not present

## 2019-05-31 DIAGNOSIS — E440 Moderate protein-calorie malnutrition: Secondary | ICD-10-CM | POA: Diagnosis not present

## 2019-05-31 DIAGNOSIS — N186 End stage renal disease: Secondary | ICD-10-CM | POA: Diagnosis not present

## 2019-05-31 DIAGNOSIS — D509 Iron deficiency anemia, unspecified: Secondary | ICD-10-CM | POA: Diagnosis not present

## 2019-05-31 DIAGNOSIS — D631 Anemia in chronic kidney disease: Secondary | ICD-10-CM | POA: Diagnosis not present

## 2019-05-31 DIAGNOSIS — Z992 Dependence on renal dialysis: Secondary | ICD-10-CM | POA: Diagnosis not present

## 2019-05-31 DIAGNOSIS — E7841 Elevated Lipoprotein(a): Secondary | ICD-10-CM | POA: Diagnosis not present

## 2019-05-31 DIAGNOSIS — Z79899 Other long term (current) drug therapy: Secondary | ICD-10-CM | POA: Diagnosis not present

## 2019-05-31 DIAGNOSIS — Z4932 Encounter for adequacy testing for peritoneal dialysis: Secondary | ICD-10-CM | POA: Diagnosis not present

## 2019-05-31 DIAGNOSIS — N2581 Secondary hyperparathyroidism of renal origin: Secondary | ICD-10-CM | POA: Diagnosis not present

## 2019-05-31 DIAGNOSIS — N2589 Other disorders resulting from impaired renal tubular function: Secondary | ICD-10-CM | POA: Diagnosis not present

## 2019-06-01 DIAGNOSIS — Z79899 Other long term (current) drug therapy: Secondary | ICD-10-CM | POA: Diagnosis not present

## 2019-06-01 DIAGNOSIS — D631 Anemia in chronic kidney disease: Secondary | ICD-10-CM | POA: Diagnosis not present

## 2019-06-01 DIAGNOSIS — D509 Iron deficiency anemia, unspecified: Secondary | ICD-10-CM | POA: Diagnosis not present

## 2019-06-01 DIAGNOSIS — N2581 Secondary hyperparathyroidism of renal origin: Secondary | ICD-10-CM | POA: Diagnosis not present

## 2019-06-01 DIAGNOSIS — Z992 Dependence on renal dialysis: Secondary | ICD-10-CM | POA: Diagnosis not present

## 2019-06-01 DIAGNOSIS — E440 Moderate protein-calorie malnutrition: Secondary | ICD-10-CM | POA: Diagnosis not present

## 2019-06-01 DIAGNOSIS — E7841 Elevated Lipoprotein(a): Secondary | ICD-10-CM | POA: Diagnosis not present

## 2019-06-01 DIAGNOSIS — N186 End stage renal disease: Secondary | ICD-10-CM | POA: Diagnosis not present

## 2019-06-01 DIAGNOSIS — Z4932 Encounter for adequacy testing for peritoneal dialysis: Secondary | ICD-10-CM | POA: Diagnosis not present

## 2019-06-01 DIAGNOSIS — N2589 Other disorders resulting from impaired renal tubular function: Secondary | ICD-10-CM | POA: Diagnosis not present

## 2019-06-02 DIAGNOSIS — N2589 Other disorders resulting from impaired renal tubular function: Secondary | ICD-10-CM | POA: Diagnosis not present

## 2019-06-02 DIAGNOSIS — D509 Iron deficiency anemia, unspecified: Secondary | ICD-10-CM | POA: Diagnosis not present

## 2019-06-02 DIAGNOSIS — D631 Anemia in chronic kidney disease: Secondary | ICD-10-CM | POA: Diagnosis not present

## 2019-06-02 DIAGNOSIS — E440 Moderate protein-calorie malnutrition: Secondary | ICD-10-CM | POA: Diagnosis not present

## 2019-06-02 DIAGNOSIS — N2581 Secondary hyperparathyroidism of renal origin: Secondary | ICD-10-CM | POA: Diagnosis not present

## 2019-06-02 DIAGNOSIS — Z992 Dependence on renal dialysis: Secondary | ICD-10-CM | POA: Diagnosis not present

## 2019-06-02 DIAGNOSIS — N186 End stage renal disease: Secondary | ICD-10-CM | POA: Diagnosis not present

## 2019-06-02 DIAGNOSIS — E7841 Elevated Lipoprotein(a): Secondary | ICD-10-CM | POA: Diagnosis not present

## 2019-06-02 DIAGNOSIS — Z4932 Encounter for adequacy testing for peritoneal dialysis: Secondary | ICD-10-CM | POA: Diagnosis not present

## 2019-06-02 DIAGNOSIS — Z79899 Other long term (current) drug therapy: Secondary | ICD-10-CM | POA: Diagnosis not present

## 2019-06-03 DIAGNOSIS — N2589 Other disorders resulting from impaired renal tubular function: Secondary | ICD-10-CM | POA: Diagnosis not present

## 2019-06-03 DIAGNOSIS — Z4932 Encounter for adequacy testing for peritoneal dialysis: Secondary | ICD-10-CM | POA: Diagnosis not present

## 2019-06-03 DIAGNOSIS — D509 Iron deficiency anemia, unspecified: Secondary | ICD-10-CM | POA: Diagnosis not present

## 2019-06-03 DIAGNOSIS — E7841 Elevated Lipoprotein(a): Secondary | ICD-10-CM | POA: Diagnosis not present

## 2019-06-03 DIAGNOSIS — Z79899 Other long term (current) drug therapy: Secondary | ICD-10-CM | POA: Diagnosis not present

## 2019-06-03 DIAGNOSIS — Z992 Dependence on renal dialysis: Secondary | ICD-10-CM | POA: Diagnosis not present

## 2019-06-03 DIAGNOSIS — N186 End stage renal disease: Secondary | ICD-10-CM | POA: Diagnosis not present

## 2019-06-03 DIAGNOSIS — N2581 Secondary hyperparathyroidism of renal origin: Secondary | ICD-10-CM | POA: Diagnosis not present

## 2019-06-03 DIAGNOSIS — D631 Anemia in chronic kidney disease: Secondary | ICD-10-CM | POA: Diagnosis not present

## 2019-06-03 DIAGNOSIS — E440 Moderate protein-calorie malnutrition: Secondary | ICD-10-CM | POA: Diagnosis not present

## 2019-06-04 DIAGNOSIS — N2581 Secondary hyperparathyroidism of renal origin: Secondary | ICD-10-CM | POA: Diagnosis not present

## 2019-06-04 DIAGNOSIS — E440 Moderate protein-calorie malnutrition: Secondary | ICD-10-CM | POA: Diagnosis not present

## 2019-06-04 DIAGNOSIS — N186 End stage renal disease: Secondary | ICD-10-CM | POA: Diagnosis not present

## 2019-06-04 DIAGNOSIS — N2589 Other disorders resulting from impaired renal tubular function: Secondary | ICD-10-CM | POA: Diagnosis not present

## 2019-06-04 DIAGNOSIS — Z79899 Other long term (current) drug therapy: Secondary | ICD-10-CM | POA: Diagnosis not present

## 2019-06-04 DIAGNOSIS — Z4932 Encounter for adequacy testing for peritoneal dialysis: Secondary | ICD-10-CM | POA: Diagnosis not present

## 2019-06-04 DIAGNOSIS — D631 Anemia in chronic kidney disease: Secondary | ICD-10-CM | POA: Diagnosis not present

## 2019-06-04 DIAGNOSIS — D509 Iron deficiency anemia, unspecified: Secondary | ICD-10-CM | POA: Diagnosis not present

## 2019-06-04 DIAGNOSIS — Z992 Dependence on renal dialysis: Secondary | ICD-10-CM | POA: Diagnosis not present

## 2019-06-04 DIAGNOSIS — E7841 Elevated Lipoprotein(a): Secondary | ICD-10-CM | POA: Diagnosis not present

## 2019-06-05 DIAGNOSIS — Z992 Dependence on renal dialysis: Secondary | ICD-10-CM | POA: Diagnosis not present

## 2019-06-05 DIAGNOSIS — Z79899 Other long term (current) drug therapy: Secondary | ICD-10-CM | POA: Diagnosis not present

## 2019-06-05 DIAGNOSIS — D631 Anemia in chronic kidney disease: Secondary | ICD-10-CM | POA: Diagnosis not present

## 2019-06-05 DIAGNOSIS — E7841 Elevated Lipoprotein(a): Secondary | ICD-10-CM | POA: Diagnosis not present

## 2019-06-05 DIAGNOSIS — Z4932 Encounter for adequacy testing for peritoneal dialysis: Secondary | ICD-10-CM | POA: Diagnosis not present

## 2019-06-05 DIAGNOSIS — N186 End stage renal disease: Secondary | ICD-10-CM | POA: Diagnosis not present

## 2019-06-05 DIAGNOSIS — E440 Moderate protein-calorie malnutrition: Secondary | ICD-10-CM | POA: Diagnosis not present

## 2019-06-05 DIAGNOSIS — D509 Iron deficiency anemia, unspecified: Secondary | ICD-10-CM | POA: Diagnosis not present

## 2019-06-05 DIAGNOSIS — I129 Hypertensive chronic kidney disease with stage 1 through stage 4 chronic kidney disease, or unspecified chronic kidney disease: Secondary | ICD-10-CM | POA: Diagnosis not present

## 2019-06-05 DIAGNOSIS — N2589 Other disorders resulting from impaired renal tubular function: Secondary | ICD-10-CM | POA: Diagnosis not present

## 2019-06-05 DIAGNOSIS — N2581 Secondary hyperparathyroidism of renal origin: Secondary | ICD-10-CM | POA: Diagnosis not present

## 2019-06-06 DIAGNOSIS — Z79899 Other long term (current) drug therapy: Secondary | ICD-10-CM | POA: Diagnosis not present

## 2019-06-06 DIAGNOSIS — D631 Anemia in chronic kidney disease: Secondary | ICD-10-CM | POA: Diagnosis not present

## 2019-06-06 DIAGNOSIS — E7841 Elevated Lipoprotein(a): Secondary | ICD-10-CM | POA: Diagnosis not present

## 2019-06-06 DIAGNOSIS — Z992 Dependence on renal dialysis: Secondary | ICD-10-CM | POA: Diagnosis not present

## 2019-06-06 DIAGNOSIS — E440 Moderate protein-calorie malnutrition: Secondary | ICD-10-CM | POA: Diagnosis not present

## 2019-06-06 DIAGNOSIS — N2589 Other disorders resulting from impaired renal tubular function: Secondary | ICD-10-CM | POA: Diagnosis not present

## 2019-06-06 DIAGNOSIS — N186 End stage renal disease: Secondary | ICD-10-CM | POA: Diagnosis not present

## 2019-06-06 DIAGNOSIS — D509 Iron deficiency anemia, unspecified: Secondary | ICD-10-CM | POA: Diagnosis not present

## 2019-06-06 DIAGNOSIS — N2581 Secondary hyperparathyroidism of renal origin: Secondary | ICD-10-CM | POA: Diagnosis not present

## 2019-06-07 DIAGNOSIS — D631 Anemia in chronic kidney disease: Secondary | ICD-10-CM | POA: Diagnosis not present

## 2019-06-07 DIAGNOSIS — D509 Iron deficiency anemia, unspecified: Secondary | ICD-10-CM | POA: Diagnosis not present

## 2019-06-07 DIAGNOSIS — Z992 Dependence on renal dialysis: Secondary | ICD-10-CM | POA: Diagnosis not present

## 2019-06-07 DIAGNOSIS — N2581 Secondary hyperparathyroidism of renal origin: Secondary | ICD-10-CM | POA: Diagnosis not present

## 2019-06-07 DIAGNOSIS — E440 Moderate protein-calorie malnutrition: Secondary | ICD-10-CM | POA: Diagnosis not present

## 2019-06-07 DIAGNOSIS — N2589 Other disorders resulting from impaired renal tubular function: Secondary | ICD-10-CM | POA: Diagnosis not present

## 2019-06-07 DIAGNOSIS — N186 End stage renal disease: Secondary | ICD-10-CM | POA: Diagnosis not present

## 2019-06-07 DIAGNOSIS — Z79899 Other long term (current) drug therapy: Secondary | ICD-10-CM | POA: Diagnosis not present

## 2019-06-07 DIAGNOSIS — E7841 Elevated Lipoprotein(a): Secondary | ICD-10-CM | POA: Diagnosis not present

## 2019-06-08 DIAGNOSIS — N2589 Other disorders resulting from impaired renal tubular function: Secondary | ICD-10-CM | POA: Diagnosis not present

## 2019-06-08 DIAGNOSIS — E7841 Elevated Lipoprotein(a): Secondary | ICD-10-CM | POA: Diagnosis not present

## 2019-06-08 DIAGNOSIS — N2581 Secondary hyperparathyroidism of renal origin: Secondary | ICD-10-CM | POA: Diagnosis not present

## 2019-06-08 DIAGNOSIS — Z79899 Other long term (current) drug therapy: Secondary | ICD-10-CM | POA: Diagnosis not present

## 2019-06-08 DIAGNOSIS — D631 Anemia in chronic kidney disease: Secondary | ICD-10-CM | POA: Diagnosis not present

## 2019-06-08 DIAGNOSIS — D509 Iron deficiency anemia, unspecified: Secondary | ICD-10-CM | POA: Diagnosis not present

## 2019-06-08 DIAGNOSIS — E440 Moderate protein-calorie malnutrition: Secondary | ICD-10-CM | POA: Diagnosis not present

## 2019-06-08 DIAGNOSIS — Z992 Dependence on renal dialysis: Secondary | ICD-10-CM | POA: Diagnosis not present

## 2019-06-08 DIAGNOSIS — N186 End stage renal disease: Secondary | ICD-10-CM | POA: Diagnosis not present

## 2019-06-09 DIAGNOSIS — E7841 Elevated Lipoprotein(a): Secondary | ICD-10-CM | POA: Diagnosis not present

## 2019-06-09 DIAGNOSIS — Z992 Dependence on renal dialysis: Secondary | ICD-10-CM | POA: Diagnosis not present

## 2019-06-09 DIAGNOSIS — E440 Moderate protein-calorie malnutrition: Secondary | ICD-10-CM | POA: Diagnosis not present

## 2019-06-09 DIAGNOSIS — N2581 Secondary hyperparathyroidism of renal origin: Secondary | ICD-10-CM | POA: Diagnosis not present

## 2019-06-09 DIAGNOSIS — N2589 Other disorders resulting from impaired renal tubular function: Secondary | ICD-10-CM | POA: Diagnosis not present

## 2019-06-09 DIAGNOSIS — D509 Iron deficiency anemia, unspecified: Secondary | ICD-10-CM | POA: Diagnosis not present

## 2019-06-09 DIAGNOSIS — N186 End stage renal disease: Secondary | ICD-10-CM | POA: Diagnosis not present

## 2019-06-09 DIAGNOSIS — Z79899 Other long term (current) drug therapy: Secondary | ICD-10-CM | POA: Diagnosis not present

## 2019-06-09 DIAGNOSIS — D631 Anemia in chronic kidney disease: Secondary | ICD-10-CM | POA: Diagnosis not present

## 2019-06-10 DIAGNOSIS — D509 Iron deficiency anemia, unspecified: Secondary | ICD-10-CM | POA: Diagnosis not present

## 2019-06-10 DIAGNOSIS — Z79899 Other long term (current) drug therapy: Secondary | ICD-10-CM | POA: Diagnosis not present

## 2019-06-10 DIAGNOSIS — N186 End stage renal disease: Secondary | ICD-10-CM | POA: Diagnosis not present

## 2019-06-10 DIAGNOSIS — E440 Moderate protein-calorie malnutrition: Secondary | ICD-10-CM | POA: Diagnosis not present

## 2019-06-10 DIAGNOSIS — N2589 Other disorders resulting from impaired renal tubular function: Secondary | ICD-10-CM | POA: Diagnosis not present

## 2019-06-10 DIAGNOSIS — N2581 Secondary hyperparathyroidism of renal origin: Secondary | ICD-10-CM | POA: Diagnosis not present

## 2019-06-10 DIAGNOSIS — D631 Anemia in chronic kidney disease: Secondary | ICD-10-CM | POA: Diagnosis not present

## 2019-06-10 DIAGNOSIS — E7841 Elevated Lipoprotein(a): Secondary | ICD-10-CM | POA: Diagnosis not present

## 2019-06-10 DIAGNOSIS — Z992 Dependence on renal dialysis: Secondary | ICD-10-CM | POA: Diagnosis not present

## 2019-06-11 DIAGNOSIS — N186 End stage renal disease: Secondary | ICD-10-CM | POA: Diagnosis not present

## 2019-06-11 DIAGNOSIS — D509 Iron deficiency anemia, unspecified: Secondary | ICD-10-CM | POA: Diagnosis not present

## 2019-06-11 DIAGNOSIS — E440 Moderate protein-calorie malnutrition: Secondary | ICD-10-CM | POA: Diagnosis not present

## 2019-06-11 DIAGNOSIS — E7841 Elevated Lipoprotein(a): Secondary | ICD-10-CM | POA: Diagnosis not present

## 2019-06-11 DIAGNOSIS — Z992 Dependence on renal dialysis: Secondary | ICD-10-CM | POA: Diagnosis not present

## 2019-06-11 DIAGNOSIS — D631 Anemia in chronic kidney disease: Secondary | ICD-10-CM | POA: Diagnosis not present

## 2019-06-11 DIAGNOSIS — Z79899 Other long term (current) drug therapy: Secondary | ICD-10-CM | POA: Diagnosis not present

## 2019-06-11 DIAGNOSIS — N2589 Other disorders resulting from impaired renal tubular function: Secondary | ICD-10-CM | POA: Diagnosis not present

## 2019-06-11 DIAGNOSIS — N2581 Secondary hyperparathyroidism of renal origin: Secondary | ICD-10-CM | POA: Diagnosis not present

## 2019-06-12 DIAGNOSIS — Z79899 Other long term (current) drug therapy: Secondary | ICD-10-CM | POA: Diagnosis not present

## 2019-06-12 DIAGNOSIS — D631 Anemia in chronic kidney disease: Secondary | ICD-10-CM | POA: Diagnosis not present

## 2019-06-12 DIAGNOSIS — E7841 Elevated Lipoprotein(a): Secondary | ICD-10-CM | POA: Diagnosis not present

## 2019-06-12 DIAGNOSIS — E440 Moderate protein-calorie malnutrition: Secondary | ICD-10-CM | POA: Diagnosis not present

## 2019-06-12 DIAGNOSIS — Z992 Dependence on renal dialysis: Secondary | ICD-10-CM | POA: Diagnosis not present

## 2019-06-12 DIAGNOSIS — N2581 Secondary hyperparathyroidism of renal origin: Secondary | ICD-10-CM | POA: Diagnosis not present

## 2019-06-12 DIAGNOSIS — D509 Iron deficiency anemia, unspecified: Secondary | ICD-10-CM | POA: Diagnosis not present

## 2019-06-12 DIAGNOSIS — N186 End stage renal disease: Secondary | ICD-10-CM | POA: Diagnosis not present

## 2019-06-12 DIAGNOSIS — N2589 Other disorders resulting from impaired renal tubular function: Secondary | ICD-10-CM | POA: Diagnosis not present

## 2019-06-13 DIAGNOSIS — Z992 Dependence on renal dialysis: Secondary | ICD-10-CM | POA: Diagnosis not present

## 2019-06-13 DIAGNOSIS — N2589 Other disorders resulting from impaired renal tubular function: Secondary | ICD-10-CM | POA: Diagnosis not present

## 2019-06-13 DIAGNOSIS — D631 Anemia in chronic kidney disease: Secondary | ICD-10-CM | POA: Diagnosis not present

## 2019-06-13 DIAGNOSIS — E440 Moderate protein-calorie malnutrition: Secondary | ICD-10-CM | POA: Diagnosis not present

## 2019-06-13 DIAGNOSIS — N2581 Secondary hyperparathyroidism of renal origin: Secondary | ICD-10-CM | POA: Diagnosis not present

## 2019-06-13 DIAGNOSIS — N186 End stage renal disease: Secondary | ICD-10-CM | POA: Diagnosis not present

## 2019-06-13 DIAGNOSIS — D509 Iron deficiency anemia, unspecified: Secondary | ICD-10-CM | POA: Diagnosis not present

## 2019-06-13 DIAGNOSIS — E7841 Elevated Lipoprotein(a): Secondary | ICD-10-CM | POA: Diagnosis not present

## 2019-06-13 DIAGNOSIS — Z79899 Other long term (current) drug therapy: Secondary | ICD-10-CM | POA: Diagnosis not present

## 2019-06-14 DIAGNOSIS — Z79899 Other long term (current) drug therapy: Secondary | ICD-10-CM | POA: Diagnosis not present

## 2019-06-14 DIAGNOSIS — N186 End stage renal disease: Secondary | ICD-10-CM | POA: Diagnosis not present

## 2019-06-14 DIAGNOSIS — Z992 Dependence on renal dialysis: Secondary | ICD-10-CM | POA: Diagnosis not present

## 2019-06-14 DIAGNOSIS — N2589 Other disorders resulting from impaired renal tubular function: Secondary | ICD-10-CM | POA: Diagnosis not present

## 2019-06-14 DIAGNOSIS — D631 Anemia in chronic kidney disease: Secondary | ICD-10-CM | POA: Diagnosis not present

## 2019-06-14 DIAGNOSIS — E440 Moderate protein-calorie malnutrition: Secondary | ICD-10-CM | POA: Diagnosis not present

## 2019-06-14 DIAGNOSIS — E7841 Elevated Lipoprotein(a): Secondary | ICD-10-CM | POA: Diagnosis not present

## 2019-06-14 DIAGNOSIS — N2581 Secondary hyperparathyroidism of renal origin: Secondary | ICD-10-CM | POA: Diagnosis not present

## 2019-06-14 DIAGNOSIS — D509 Iron deficiency anemia, unspecified: Secondary | ICD-10-CM | POA: Diagnosis not present

## 2019-06-15 DIAGNOSIS — D631 Anemia in chronic kidney disease: Secondary | ICD-10-CM | POA: Diagnosis not present

## 2019-06-15 DIAGNOSIS — Z79899 Other long term (current) drug therapy: Secondary | ICD-10-CM | POA: Diagnosis not present

## 2019-06-15 DIAGNOSIS — N186 End stage renal disease: Secondary | ICD-10-CM | POA: Diagnosis not present

## 2019-06-15 DIAGNOSIS — D509 Iron deficiency anemia, unspecified: Secondary | ICD-10-CM | POA: Diagnosis not present

## 2019-06-15 DIAGNOSIS — N2581 Secondary hyperparathyroidism of renal origin: Secondary | ICD-10-CM | POA: Diagnosis not present

## 2019-06-15 DIAGNOSIS — E7841 Elevated Lipoprotein(a): Secondary | ICD-10-CM | POA: Diagnosis not present

## 2019-06-15 DIAGNOSIS — Z992 Dependence on renal dialysis: Secondary | ICD-10-CM | POA: Diagnosis not present

## 2019-06-15 DIAGNOSIS — N2589 Other disorders resulting from impaired renal tubular function: Secondary | ICD-10-CM | POA: Diagnosis not present

## 2019-06-15 DIAGNOSIS — E440 Moderate protein-calorie malnutrition: Secondary | ICD-10-CM | POA: Diagnosis not present

## 2019-06-16 DIAGNOSIS — N186 End stage renal disease: Secondary | ICD-10-CM | POA: Diagnosis not present

## 2019-06-16 DIAGNOSIS — N2581 Secondary hyperparathyroidism of renal origin: Secondary | ICD-10-CM | POA: Diagnosis not present

## 2019-06-16 DIAGNOSIS — D509 Iron deficiency anemia, unspecified: Secondary | ICD-10-CM | POA: Diagnosis not present

## 2019-06-16 DIAGNOSIS — E7841 Elevated Lipoprotein(a): Secondary | ICD-10-CM | POA: Diagnosis not present

## 2019-06-16 DIAGNOSIS — Z992 Dependence on renal dialysis: Secondary | ICD-10-CM | POA: Diagnosis not present

## 2019-06-16 DIAGNOSIS — N2589 Other disorders resulting from impaired renal tubular function: Secondary | ICD-10-CM | POA: Diagnosis not present

## 2019-06-16 DIAGNOSIS — D631 Anemia in chronic kidney disease: Secondary | ICD-10-CM | POA: Diagnosis not present

## 2019-06-16 DIAGNOSIS — E440 Moderate protein-calorie malnutrition: Secondary | ICD-10-CM | POA: Diagnosis not present

## 2019-06-16 DIAGNOSIS — Z79899 Other long term (current) drug therapy: Secondary | ICD-10-CM | POA: Diagnosis not present

## 2019-06-17 DIAGNOSIS — N2589 Other disorders resulting from impaired renal tubular function: Secondary | ICD-10-CM | POA: Diagnosis not present

## 2019-06-17 DIAGNOSIS — N2581 Secondary hyperparathyroidism of renal origin: Secondary | ICD-10-CM | POA: Diagnosis not present

## 2019-06-17 DIAGNOSIS — E440 Moderate protein-calorie malnutrition: Secondary | ICD-10-CM | POA: Diagnosis not present

## 2019-06-17 DIAGNOSIS — N186 End stage renal disease: Secondary | ICD-10-CM | POA: Diagnosis not present

## 2019-06-17 DIAGNOSIS — D509 Iron deficiency anemia, unspecified: Secondary | ICD-10-CM | POA: Diagnosis not present

## 2019-06-17 DIAGNOSIS — D631 Anemia in chronic kidney disease: Secondary | ICD-10-CM | POA: Diagnosis not present

## 2019-06-17 DIAGNOSIS — Z992 Dependence on renal dialysis: Secondary | ICD-10-CM | POA: Diagnosis not present

## 2019-06-17 DIAGNOSIS — E7841 Elevated Lipoprotein(a): Secondary | ICD-10-CM | POA: Diagnosis not present

## 2019-06-17 DIAGNOSIS — Z79899 Other long term (current) drug therapy: Secondary | ICD-10-CM | POA: Diagnosis not present

## 2019-06-18 DIAGNOSIS — E440 Moderate protein-calorie malnutrition: Secondary | ICD-10-CM | POA: Diagnosis not present

## 2019-06-18 DIAGNOSIS — E7841 Elevated Lipoprotein(a): Secondary | ICD-10-CM | POA: Diagnosis not present

## 2019-06-18 DIAGNOSIS — N2589 Other disorders resulting from impaired renal tubular function: Secondary | ICD-10-CM | POA: Diagnosis not present

## 2019-06-18 DIAGNOSIS — N2581 Secondary hyperparathyroidism of renal origin: Secondary | ICD-10-CM | POA: Diagnosis not present

## 2019-06-18 DIAGNOSIS — N186 End stage renal disease: Secondary | ICD-10-CM | POA: Diagnosis not present

## 2019-06-18 DIAGNOSIS — Z79899 Other long term (current) drug therapy: Secondary | ICD-10-CM | POA: Diagnosis not present

## 2019-06-18 DIAGNOSIS — D631 Anemia in chronic kidney disease: Secondary | ICD-10-CM | POA: Diagnosis not present

## 2019-06-18 DIAGNOSIS — Z992 Dependence on renal dialysis: Secondary | ICD-10-CM | POA: Diagnosis not present

## 2019-06-18 DIAGNOSIS — D509 Iron deficiency anemia, unspecified: Secondary | ICD-10-CM | POA: Diagnosis not present

## 2019-06-19 DIAGNOSIS — D631 Anemia in chronic kidney disease: Secondary | ICD-10-CM | POA: Diagnosis not present

## 2019-06-19 DIAGNOSIS — D509 Iron deficiency anemia, unspecified: Secondary | ICD-10-CM | POA: Diagnosis not present

## 2019-06-19 DIAGNOSIS — N2589 Other disorders resulting from impaired renal tubular function: Secondary | ICD-10-CM | POA: Diagnosis not present

## 2019-06-19 DIAGNOSIS — N2581 Secondary hyperparathyroidism of renal origin: Secondary | ICD-10-CM | POA: Diagnosis not present

## 2019-06-19 DIAGNOSIS — Z79899 Other long term (current) drug therapy: Secondary | ICD-10-CM | POA: Diagnosis not present

## 2019-06-19 DIAGNOSIS — E7841 Elevated Lipoprotein(a): Secondary | ICD-10-CM | POA: Diagnosis not present

## 2019-06-19 DIAGNOSIS — E440 Moderate protein-calorie malnutrition: Secondary | ICD-10-CM | POA: Diagnosis not present

## 2019-06-19 DIAGNOSIS — N186 End stage renal disease: Secondary | ICD-10-CM | POA: Diagnosis not present

## 2019-06-19 DIAGNOSIS — Z992 Dependence on renal dialysis: Secondary | ICD-10-CM | POA: Diagnosis not present

## 2019-06-20 DIAGNOSIS — D631 Anemia in chronic kidney disease: Secondary | ICD-10-CM | POA: Diagnosis not present

## 2019-06-20 DIAGNOSIS — Z79899 Other long term (current) drug therapy: Secondary | ICD-10-CM | POA: Diagnosis not present

## 2019-06-20 DIAGNOSIS — F17210 Nicotine dependence, cigarettes, uncomplicated: Secondary | ICD-10-CM | POA: Diagnosis not present

## 2019-06-20 DIAGNOSIS — D509 Iron deficiency anemia, unspecified: Secondary | ICD-10-CM | POA: Diagnosis not present

## 2019-06-20 DIAGNOSIS — E7841 Elevated Lipoprotein(a): Secondary | ICD-10-CM | POA: Diagnosis not present

## 2019-06-20 DIAGNOSIS — Z008 Encounter for other general examination: Secondary | ICD-10-CM | POA: Diagnosis not present

## 2019-06-20 DIAGNOSIS — N186 End stage renal disease: Secondary | ICD-10-CM | POA: Diagnosis not present

## 2019-06-20 DIAGNOSIS — Z7682 Awaiting organ transplant status: Secondary | ICD-10-CM | POA: Diagnosis not present

## 2019-06-20 DIAGNOSIS — E440 Moderate protein-calorie malnutrition: Secondary | ICD-10-CM | POA: Diagnosis not present

## 2019-06-20 DIAGNOSIS — N2581 Secondary hyperparathyroidism of renal origin: Secondary | ICD-10-CM | POA: Diagnosis not present

## 2019-06-20 DIAGNOSIS — Z992 Dependence on renal dialysis: Secondary | ICD-10-CM | POA: Diagnosis not present

## 2019-06-20 DIAGNOSIS — N2589 Other disorders resulting from impaired renal tubular function: Secondary | ICD-10-CM | POA: Diagnosis not present

## 2019-06-21 DIAGNOSIS — D509 Iron deficiency anemia, unspecified: Secondary | ICD-10-CM | POA: Diagnosis not present

## 2019-06-21 DIAGNOSIS — Z992 Dependence on renal dialysis: Secondary | ICD-10-CM | POA: Diagnosis not present

## 2019-06-21 DIAGNOSIS — E440 Moderate protein-calorie malnutrition: Secondary | ICD-10-CM | POA: Diagnosis not present

## 2019-06-21 DIAGNOSIS — N2581 Secondary hyperparathyroidism of renal origin: Secondary | ICD-10-CM | POA: Diagnosis not present

## 2019-06-21 DIAGNOSIS — D631 Anemia in chronic kidney disease: Secondary | ICD-10-CM | POA: Diagnosis not present

## 2019-06-21 DIAGNOSIS — E7841 Elevated Lipoprotein(a): Secondary | ICD-10-CM | POA: Diagnosis not present

## 2019-06-21 DIAGNOSIS — N186 End stage renal disease: Secondary | ICD-10-CM | POA: Diagnosis not present

## 2019-06-21 DIAGNOSIS — Z79899 Other long term (current) drug therapy: Secondary | ICD-10-CM | POA: Diagnosis not present

## 2019-06-21 DIAGNOSIS — N2589 Other disorders resulting from impaired renal tubular function: Secondary | ICD-10-CM | POA: Diagnosis not present

## 2019-06-22 ENCOUNTER — Other Ambulatory Visit: Payer: Self-pay | Admitting: Nephrology

## 2019-06-22 DIAGNOSIS — Z01818 Encounter for other preprocedural examination: Secondary | ICD-10-CM

## 2019-06-22 DIAGNOSIS — N2581 Secondary hyperparathyroidism of renal origin: Secondary | ICD-10-CM | POA: Diagnosis not present

## 2019-06-22 DIAGNOSIS — D631 Anemia in chronic kidney disease: Secondary | ICD-10-CM | POA: Diagnosis not present

## 2019-06-22 DIAGNOSIS — Z7682 Awaiting organ transplant status: Secondary | ICD-10-CM

## 2019-06-22 DIAGNOSIS — E7841 Elevated Lipoprotein(a): Secondary | ICD-10-CM | POA: Diagnosis not present

## 2019-06-22 DIAGNOSIS — Z992 Dependence on renal dialysis: Secondary | ICD-10-CM | POA: Diagnosis not present

## 2019-06-22 DIAGNOSIS — E440 Moderate protein-calorie malnutrition: Secondary | ICD-10-CM | POA: Diagnosis not present

## 2019-06-22 DIAGNOSIS — N2589 Other disorders resulting from impaired renal tubular function: Secondary | ICD-10-CM | POA: Diagnosis not present

## 2019-06-22 DIAGNOSIS — D509 Iron deficiency anemia, unspecified: Secondary | ICD-10-CM | POA: Diagnosis not present

## 2019-06-22 DIAGNOSIS — N186 End stage renal disease: Secondary | ICD-10-CM | POA: Diagnosis not present

## 2019-06-22 DIAGNOSIS — Z79899 Other long term (current) drug therapy: Secondary | ICD-10-CM | POA: Diagnosis not present

## 2019-06-23 ENCOUNTER — Other Ambulatory Visit (HOSPITAL_COMMUNITY): Payer: Self-pay | Admitting: Nephrology

## 2019-06-23 DIAGNOSIS — Z992 Dependence on renal dialysis: Secondary | ICD-10-CM | POA: Diagnosis not present

## 2019-06-23 DIAGNOSIS — Z79899 Other long term (current) drug therapy: Secondary | ICD-10-CM | POA: Diagnosis not present

## 2019-06-23 DIAGNOSIS — D631 Anemia in chronic kidney disease: Secondary | ICD-10-CM | POA: Diagnosis not present

## 2019-06-23 DIAGNOSIS — N2581 Secondary hyperparathyroidism of renal origin: Secondary | ICD-10-CM | POA: Diagnosis not present

## 2019-06-23 DIAGNOSIS — N186 End stage renal disease: Secondary | ICD-10-CM | POA: Diagnosis not present

## 2019-06-23 DIAGNOSIS — E440 Moderate protein-calorie malnutrition: Secondary | ICD-10-CM | POA: Diagnosis not present

## 2019-06-23 DIAGNOSIS — D509 Iron deficiency anemia, unspecified: Secondary | ICD-10-CM | POA: Diagnosis not present

## 2019-06-23 DIAGNOSIS — N2589 Other disorders resulting from impaired renal tubular function: Secondary | ICD-10-CM | POA: Diagnosis not present

## 2019-06-23 DIAGNOSIS — E7841 Elevated Lipoprotein(a): Secondary | ICD-10-CM | POA: Diagnosis not present

## 2019-06-23 DIAGNOSIS — Z01810 Encounter for preprocedural cardiovascular examination: Secondary | ICD-10-CM

## 2019-06-24 DIAGNOSIS — Z79899 Other long term (current) drug therapy: Secondary | ICD-10-CM | POA: Diagnosis not present

## 2019-06-24 DIAGNOSIS — D509 Iron deficiency anemia, unspecified: Secondary | ICD-10-CM | POA: Diagnosis not present

## 2019-06-24 DIAGNOSIS — N2589 Other disorders resulting from impaired renal tubular function: Secondary | ICD-10-CM | POA: Diagnosis not present

## 2019-06-24 DIAGNOSIS — D631 Anemia in chronic kidney disease: Secondary | ICD-10-CM | POA: Diagnosis not present

## 2019-06-24 DIAGNOSIS — E440 Moderate protein-calorie malnutrition: Secondary | ICD-10-CM | POA: Diagnosis not present

## 2019-06-24 DIAGNOSIS — Z992 Dependence on renal dialysis: Secondary | ICD-10-CM | POA: Diagnosis not present

## 2019-06-24 DIAGNOSIS — N186 End stage renal disease: Secondary | ICD-10-CM | POA: Diagnosis not present

## 2019-06-24 DIAGNOSIS — E7841 Elevated Lipoprotein(a): Secondary | ICD-10-CM | POA: Diagnosis not present

## 2019-06-24 DIAGNOSIS — N2581 Secondary hyperparathyroidism of renal origin: Secondary | ICD-10-CM | POA: Diagnosis not present

## 2019-06-25 DIAGNOSIS — E440 Moderate protein-calorie malnutrition: Secondary | ICD-10-CM | POA: Diagnosis not present

## 2019-06-25 DIAGNOSIS — Z992 Dependence on renal dialysis: Secondary | ICD-10-CM | POA: Diagnosis not present

## 2019-06-25 DIAGNOSIS — N2589 Other disorders resulting from impaired renal tubular function: Secondary | ICD-10-CM | POA: Diagnosis not present

## 2019-06-25 DIAGNOSIS — N2581 Secondary hyperparathyroidism of renal origin: Secondary | ICD-10-CM | POA: Diagnosis not present

## 2019-06-25 DIAGNOSIS — E7841 Elevated Lipoprotein(a): Secondary | ICD-10-CM | POA: Diagnosis not present

## 2019-06-25 DIAGNOSIS — N186 End stage renal disease: Secondary | ICD-10-CM | POA: Diagnosis not present

## 2019-06-25 DIAGNOSIS — Z79899 Other long term (current) drug therapy: Secondary | ICD-10-CM | POA: Diagnosis not present

## 2019-06-25 DIAGNOSIS — D509 Iron deficiency anemia, unspecified: Secondary | ICD-10-CM | POA: Diagnosis not present

## 2019-06-25 DIAGNOSIS — D631 Anemia in chronic kidney disease: Secondary | ICD-10-CM | POA: Diagnosis not present

## 2019-06-26 DIAGNOSIS — N186 End stage renal disease: Secondary | ICD-10-CM | POA: Diagnosis not present

## 2019-06-26 DIAGNOSIS — N2581 Secondary hyperparathyroidism of renal origin: Secondary | ICD-10-CM | POA: Diagnosis not present

## 2019-06-26 DIAGNOSIS — D631 Anemia in chronic kidney disease: Secondary | ICD-10-CM | POA: Diagnosis not present

## 2019-06-26 DIAGNOSIS — E7841 Elevated Lipoprotein(a): Secondary | ICD-10-CM | POA: Diagnosis not present

## 2019-06-26 DIAGNOSIS — Z992 Dependence on renal dialysis: Secondary | ICD-10-CM | POA: Diagnosis not present

## 2019-06-26 DIAGNOSIS — D509 Iron deficiency anemia, unspecified: Secondary | ICD-10-CM | POA: Diagnosis not present

## 2019-06-26 DIAGNOSIS — E440 Moderate protein-calorie malnutrition: Secondary | ICD-10-CM | POA: Diagnosis not present

## 2019-06-26 DIAGNOSIS — N2589 Other disorders resulting from impaired renal tubular function: Secondary | ICD-10-CM | POA: Diagnosis not present

## 2019-06-26 DIAGNOSIS — Z79899 Other long term (current) drug therapy: Secondary | ICD-10-CM | POA: Diagnosis not present

## 2019-06-27 DIAGNOSIS — Z992 Dependence on renal dialysis: Secondary | ICD-10-CM | POA: Diagnosis not present

## 2019-06-27 DIAGNOSIS — E7841 Elevated Lipoprotein(a): Secondary | ICD-10-CM | POA: Diagnosis not present

## 2019-06-27 DIAGNOSIS — D509 Iron deficiency anemia, unspecified: Secondary | ICD-10-CM | POA: Diagnosis not present

## 2019-06-27 DIAGNOSIS — D631 Anemia in chronic kidney disease: Secondary | ICD-10-CM | POA: Diagnosis not present

## 2019-06-27 DIAGNOSIS — N2581 Secondary hyperparathyroidism of renal origin: Secondary | ICD-10-CM | POA: Diagnosis not present

## 2019-06-27 DIAGNOSIS — E440 Moderate protein-calorie malnutrition: Secondary | ICD-10-CM | POA: Diagnosis not present

## 2019-06-27 DIAGNOSIS — N2589 Other disorders resulting from impaired renal tubular function: Secondary | ICD-10-CM | POA: Diagnosis not present

## 2019-06-27 DIAGNOSIS — N186 End stage renal disease: Secondary | ICD-10-CM | POA: Diagnosis not present

## 2019-06-27 DIAGNOSIS — Z79899 Other long term (current) drug therapy: Secondary | ICD-10-CM | POA: Diagnosis not present

## 2019-06-28 DIAGNOSIS — E7841 Elevated Lipoprotein(a): Secondary | ICD-10-CM | POA: Diagnosis not present

## 2019-06-28 DIAGNOSIS — N2581 Secondary hyperparathyroidism of renal origin: Secondary | ICD-10-CM | POA: Diagnosis not present

## 2019-06-28 DIAGNOSIS — Z79899 Other long term (current) drug therapy: Secondary | ICD-10-CM | POA: Diagnosis not present

## 2019-06-28 DIAGNOSIS — D509 Iron deficiency anemia, unspecified: Secondary | ICD-10-CM | POA: Diagnosis not present

## 2019-06-28 DIAGNOSIS — Z992 Dependence on renal dialysis: Secondary | ICD-10-CM | POA: Diagnosis not present

## 2019-06-28 DIAGNOSIS — N186 End stage renal disease: Secondary | ICD-10-CM | POA: Diagnosis not present

## 2019-06-28 DIAGNOSIS — N2589 Other disorders resulting from impaired renal tubular function: Secondary | ICD-10-CM | POA: Diagnosis not present

## 2019-06-28 DIAGNOSIS — E440 Moderate protein-calorie malnutrition: Secondary | ICD-10-CM | POA: Diagnosis not present

## 2019-06-28 DIAGNOSIS — D631 Anemia in chronic kidney disease: Secondary | ICD-10-CM | POA: Diagnosis not present

## 2019-06-29 DIAGNOSIS — D509 Iron deficiency anemia, unspecified: Secondary | ICD-10-CM | POA: Diagnosis not present

## 2019-06-29 DIAGNOSIS — D631 Anemia in chronic kidney disease: Secondary | ICD-10-CM | POA: Diagnosis not present

## 2019-06-29 DIAGNOSIS — E440 Moderate protein-calorie malnutrition: Secondary | ICD-10-CM | POA: Diagnosis not present

## 2019-06-29 DIAGNOSIS — N2589 Other disorders resulting from impaired renal tubular function: Secondary | ICD-10-CM | POA: Diagnosis not present

## 2019-06-29 DIAGNOSIS — Z992 Dependence on renal dialysis: Secondary | ICD-10-CM | POA: Diagnosis not present

## 2019-06-29 DIAGNOSIS — N2581 Secondary hyperparathyroidism of renal origin: Secondary | ICD-10-CM | POA: Diagnosis not present

## 2019-06-29 DIAGNOSIS — N186 End stage renal disease: Secondary | ICD-10-CM | POA: Diagnosis not present

## 2019-06-29 DIAGNOSIS — E7841 Elevated Lipoprotein(a): Secondary | ICD-10-CM | POA: Diagnosis not present

## 2019-06-29 DIAGNOSIS — Z79899 Other long term (current) drug therapy: Secondary | ICD-10-CM | POA: Diagnosis not present

## 2019-06-30 DIAGNOSIS — Z79899 Other long term (current) drug therapy: Secondary | ICD-10-CM | POA: Diagnosis not present

## 2019-06-30 DIAGNOSIS — N186 End stage renal disease: Secondary | ICD-10-CM | POA: Diagnosis not present

## 2019-06-30 DIAGNOSIS — Z992 Dependence on renal dialysis: Secondary | ICD-10-CM | POA: Diagnosis not present

## 2019-06-30 DIAGNOSIS — D509 Iron deficiency anemia, unspecified: Secondary | ICD-10-CM | POA: Diagnosis not present

## 2019-06-30 DIAGNOSIS — E440 Moderate protein-calorie malnutrition: Secondary | ICD-10-CM | POA: Diagnosis not present

## 2019-06-30 DIAGNOSIS — E7841 Elevated Lipoprotein(a): Secondary | ICD-10-CM | POA: Diagnosis not present

## 2019-06-30 DIAGNOSIS — N2581 Secondary hyperparathyroidism of renal origin: Secondary | ICD-10-CM | POA: Diagnosis not present

## 2019-06-30 DIAGNOSIS — D631 Anemia in chronic kidney disease: Secondary | ICD-10-CM | POA: Diagnosis not present

## 2019-06-30 DIAGNOSIS — N2589 Other disorders resulting from impaired renal tubular function: Secondary | ICD-10-CM | POA: Diagnosis not present

## 2019-07-01 DIAGNOSIS — Z79899 Other long term (current) drug therapy: Secondary | ICD-10-CM | POA: Diagnosis not present

## 2019-07-01 DIAGNOSIS — N2581 Secondary hyperparathyroidism of renal origin: Secondary | ICD-10-CM | POA: Diagnosis not present

## 2019-07-01 DIAGNOSIS — D631 Anemia in chronic kidney disease: Secondary | ICD-10-CM | POA: Diagnosis not present

## 2019-07-01 DIAGNOSIS — E7841 Elevated Lipoprotein(a): Secondary | ICD-10-CM | POA: Diagnosis not present

## 2019-07-01 DIAGNOSIS — Z992 Dependence on renal dialysis: Secondary | ICD-10-CM | POA: Diagnosis not present

## 2019-07-01 DIAGNOSIS — D509 Iron deficiency anemia, unspecified: Secondary | ICD-10-CM | POA: Diagnosis not present

## 2019-07-01 DIAGNOSIS — E440 Moderate protein-calorie malnutrition: Secondary | ICD-10-CM | POA: Diagnosis not present

## 2019-07-01 DIAGNOSIS — N2589 Other disorders resulting from impaired renal tubular function: Secondary | ICD-10-CM | POA: Diagnosis not present

## 2019-07-01 DIAGNOSIS — N186 End stage renal disease: Secondary | ICD-10-CM | POA: Diagnosis not present

## 2019-07-02 DIAGNOSIS — N2589 Other disorders resulting from impaired renal tubular function: Secondary | ICD-10-CM | POA: Diagnosis not present

## 2019-07-02 DIAGNOSIS — D631 Anemia in chronic kidney disease: Secondary | ICD-10-CM | POA: Diagnosis not present

## 2019-07-02 DIAGNOSIS — Z992 Dependence on renal dialysis: Secondary | ICD-10-CM | POA: Diagnosis not present

## 2019-07-02 DIAGNOSIS — N2581 Secondary hyperparathyroidism of renal origin: Secondary | ICD-10-CM | POA: Diagnosis not present

## 2019-07-02 DIAGNOSIS — N186 End stage renal disease: Secondary | ICD-10-CM | POA: Diagnosis not present

## 2019-07-02 DIAGNOSIS — E440 Moderate protein-calorie malnutrition: Secondary | ICD-10-CM | POA: Diagnosis not present

## 2019-07-02 DIAGNOSIS — D509 Iron deficiency anemia, unspecified: Secondary | ICD-10-CM | POA: Diagnosis not present

## 2019-07-02 DIAGNOSIS — E7841 Elevated Lipoprotein(a): Secondary | ICD-10-CM | POA: Diagnosis not present

## 2019-07-02 DIAGNOSIS — Z79899 Other long term (current) drug therapy: Secondary | ICD-10-CM | POA: Diagnosis not present

## 2019-07-03 DIAGNOSIS — Z992 Dependence on renal dialysis: Secondary | ICD-10-CM | POA: Diagnosis not present

## 2019-07-03 DIAGNOSIS — N2589 Other disorders resulting from impaired renal tubular function: Secondary | ICD-10-CM | POA: Diagnosis not present

## 2019-07-03 DIAGNOSIS — D509 Iron deficiency anemia, unspecified: Secondary | ICD-10-CM | POA: Diagnosis not present

## 2019-07-03 DIAGNOSIS — N2581 Secondary hyperparathyroidism of renal origin: Secondary | ICD-10-CM | POA: Diagnosis not present

## 2019-07-03 DIAGNOSIS — N186 End stage renal disease: Secondary | ICD-10-CM | POA: Diagnosis not present

## 2019-07-03 DIAGNOSIS — Z79899 Other long term (current) drug therapy: Secondary | ICD-10-CM | POA: Diagnosis not present

## 2019-07-03 DIAGNOSIS — D631 Anemia in chronic kidney disease: Secondary | ICD-10-CM | POA: Diagnosis not present

## 2019-07-03 DIAGNOSIS — E440 Moderate protein-calorie malnutrition: Secondary | ICD-10-CM | POA: Diagnosis not present

## 2019-07-03 DIAGNOSIS — E7841 Elevated Lipoprotein(a): Secondary | ICD-10-CM | POA: Diagnosis not present

## 2019-07-04 DIAGNOSIS — N2589 Other disorders resulting from impaired renal tubular function: Secondary | ICD-10-CM | POA: Diagnosis not present

## 2019-07-04 DIAGNOSIS — Z992 Dependence on renal dialysis: Secondary | ICD-10-CM | POA: Diagnosis not present

## 2019-07-04 DIAGNOSIS — D509 Iron deficiency anemia, unspecified: Secondary | ICD-10-CM | POA: Diagnosis not present

## 2019-07-04 DIAGNOSIS — N2581 Secondary hyperparathyroidism of renal origin: Secondary | ICD-10-CM | POA: Diagnosis not present

## 2019-07-04 DIAGNOSIS — Z79899 Other long term (current) drug therapy: Secondary | ICD-10-CM | POA: Diagnosis not present

## 2019-07-04 DIAGNOSIS — E7841 Elevated Lipoprotein(a): Secondary | ICD-10-CM | POA: Diagnosis not present

## 2019-07-04 DIAGNOSIS — E440 Moderate protein-calorie malnutrition: Secondary | ICD-10-CM | POA: Diagnosis not present

## 2019-07-04 DIAGNOSIS — D631 Anemia in chronic kidney disease: Secondary | ICD-10-CM | POA: Diagnosis not present

## 2019-07-04 DIAGNOSIS — N186 End stage renal disease: Secondary | ICD-10-CM | POA: Diagnosis not present

## 2019-07-05 DIAGNOSIS — E7841 Elevated Lipoprotein(a): Secondary | ICD-10-CM | POA: Diagnosis not present

## 2019-07-05 DIAGNOSIS — D631 Anemia in chronic kidney disease: Secondary | ICD-10-CM | POA: Diagnosis not present

## 2019-07-05 DIAGNOSIS — Z79899 Other long term (current) drug therapy: Secondary | ICD-10-CM | POA: Diagnosis not present

## 2019-07-05 DIAGNOSIS — D509 Iron deficiency anemia, unspecified: Secondary | ICD-10-CM | POA: Diagnosis not present

## 2019-07-05 DIAGNOSIS — N2589 Other disorders resulting from impaired renal tubular function: Secondary | ICD-10-CM | POA: Diagnosis not present

## 2019-07-05 DIAGNOSIS — N186 End stage renal disease: Secondary | ICD-10-CM | POA: Diagnosis not present

## 2019-07-05 DIAGNOSIS — E440 Moderate protein-calorie malnutrition: Secondary | ICD-10-CM | POA: Diagnosis not present

## 2019-07-05 DIAGNOSIS — Z992 Dependence on renal dialysis: Secondary | ICD-10-CM | POA: Diagnosis not present

## 2019-07-05 DIAGNOSIS — N2581 Secondary hyperparathyroidism of renal origin: Secondary | ICD-10-CM | POA: Diagnosis not present

## 2019-07-06 ENCOUNTER — Ambulatory Visit
Admission: RE | Admit: 2019-07-06 | Discharge: 2019-07-06 | Disposition: A | Discharge status: Home / Self Care | Payer: Medicare Other | Source: Ambulatory Visit | Attending: Nephrology | Admitting: Nephrology

## 2019-07-06 DIAGNOSIS — Z01818 Encounter for other preprocedural examination: Secondary | ICD-10-CM

## 2019-07-06 DIAGNOSIS — I129 Hypertensive chronic kidney disease with stage 1 through stage 4 chronic kidney disease, or unspecified chronic kidney disease: Secondary | ICD-10-CM | POA: Diagnosis not present

## 2019-07-06 DIAGNOSIS — D509 Iron deficiency anemia, unspecified: Secondary | ICD-10-CM | POA: Diagnosis not present

## 2019-07-06 DIAGNOSIS — Z7682 Awaiting organ transplant status: Secondary | ICD-10-CM

## 2019-07-06 DIAGNOSIS — Z79899 Other long term (current) drug therapy: Secondary | ICD-10-CM | POA: Diagnosis not present

## 2019-07-06 DIAGNOSIS — D631 Anemia in chronic kidney disease: Secondary | ICD-10-CM | POA: Diagnosis not present

## 2019-07-06 DIAGNOSIS — Z992 Dependence on renal dialysis: Secondary | ICD-10-CM | POA: Diagnosis not present

## 2019-07-06 DIAGNOSIS — E7841 Elevated Lipoprotein(a): Secondary | ICD-10-CM | POA: Diagnosis not present

## 2019-07-06 DIAGNOSIS — N3289 Other specified disorders of bladder: Secondary | ICD-10-CM | POA: Diagnosis not present

## 2019-07-06 DIAGNOSIS — E440 Moderate protein-calorie malnutrition: Secondary | ICD-10-CM | POA: Diagnosis not present

## 2019-07-06 DIAGNOSIS — N2589 Other disorders resulting from impaired renal tubular function: Secondary | ICD-10-CM | POA: Diagnosis not present

## 2019-07-06 DIAGNOSIS — N186 End stage renal disease: Secondary | ICD-10-CM | POA: Diagnosis not present

## 2019-07-06 DIAGNOSIS — N2581 Secondary hyperparathyroidism of renal origin: Secondary | ICD-10-CM | POA: Diagnosis not present

## 2019-07-07 ENCOUNTER — Inpatient Hospital Stay (HOSPITAL_COMMUNITY)
Admission: RE | Admit: 2019-07-07 | Discharge: 2019-07-07 | Disposition: A | Discharge status: Home / Self Care | Payer: Medicare Other | Source: Ambulatory Visit

## 2019-07-07 ENCOUNTER — Telehealth (HOSPITAL_COMMUNITY): Payer: Self-pay | Admitting: *Deleted

## 2019-07-07 DIAGNOSIS — N186 End stage renal disease: Secondary | ICD-10-CM | POA: Diagnosis not present

## 2019-07-07 DIAGNOSIS — D509 Iron deficiency anemia, unspecified: Secondary | ICD-10-CM | POA: Diagnosis not present

## 2019-07-07 DIAGNOSIS — Z79899 Other long term (current) drug therapy: Secondary | ICD-10-CM | POA: Diagnosis not present

## 2019-07-07 DIAGNOSIS — D631 Anemia in chronic kidney disease: Secondary | ICD-10-CM | POA: Diagnosis not present

## 2019-07-07 DIAGNOSIS — N2589 Other disorders resulting from impaired renal tubular function: Secondary | ICD-10-CM | POA: Diagnosis not present

## 2019-07-07 DIAGNOSIS — N2581 Secondary hyperparathyroidism of renal origin: Secondary | ICD-10-CM | POA: Diagnosis not present

## 2019-07-07 DIAGNOSIS — Z992 Dependence on renal dialysis: Secondary | ICD-10-CM | POA: Diagnosis not present

## 2019-07-07 DIAGNOSIS — Z4932 Encounter for adequacy testing for peritoneal dialysis: Secondary | ICD-10-CM | POA: Diagnosis not present

## 2019-07-07 DIAGNOSIS — E440 Moderate protein-calorie malnutrition: Secondary | ICD-10-CM | POA: Diagnosis not present

## 2019-07-07 NOTE — Progress Notes (Signed)
Pt reported to this RN that he tested positive for COVID-19 on 04/15/19 at [facility] in [place]. Pt provided a photo copy of these results at the drive thru. Pt was not tested today and was advised to present those results on the day of procedure. Pt verbalizes understanding.  Jacqlyn Larsen, RN

## 2019-07-07 NOTE — Telephone Encounter (Signed)
Patient given detailed instructions per Stress Test Requisition Sheet for test on 07/11/19 at 2:00.Patient Notified to arrive 30 minutes early, and that it is imperative to arrive on time for appointment to keep from having the test rescheduled.  Patient verbalized understanding. Cory Dixon

## 2019-07-08 DIAGNOSIS — Z992 Dependence on renal dialysis: Secondary | ICD-10-CM | POA: Diagnosis not present

## 2019-07-08 DIAGNOSIS — N2581 Secondary hyperparathyroidism of renal origin: Secondary | ICD-10-CM | POA: Diagnosis not present

## 2019-07-08 DIAGNOSIS — N2589 Other disorders resulting from impaired renal tubular function: Secondary | ICD-10-CM | POA: Diagnosis not present

## 2019-07-08 DIAGNOSIS — D631 Anemia in chronic kidney disease: Secondary | ICD-10-CM | POA: Diagnosis not present

## 2019-07-08 DIAGNOSIS — E440 Moderate protein-calorie malnutrition: Secondary | ICD-10-CM | POA: Diagnosis not present

## 2019-07-08 DIAGNOSIS — N186 End stage renal disease: Secondary | ICD-10-CM | POA: Diagnosis not present

## 2019-07-08 DIAGNOSIS — Z4932 Encounter for adequacy testing for peritoneal dialysis: Secondary | ICD-10-CM | POA: Diagnosis not present

## 2019-07-08 DIAGNOSIS — D509 Iron deficiency anemia, unspecified: Secondary | ICD-10-CM | POA: Diagnosis not present

## 2019-07-08 DIAGNOSIS — Z79899 Other long term (current) drug therapy: Secondary | ICD-10-CM | POA: Diagnosis not present

## 2019-07-09 DIAGNOSIS — N2581 Secondary hyperparathyroidism of renal origin: Secondary | ICD-10-CM | POA: Diagnosis not present

## 2019-07-09 DIAGNOSIS — E440 Moderate protein-calorie malnutrition: Secondary | ICD-10-CM | POA: Diagnosis not present

## 2019-07-09 DIAGNOSIS — N2589 Other disorders resulting from impaired renal tubular function: Secondary | ICD-10-CM | POA: Diagnosis not present

## 2019-07-09 DIAGNOSIS — N186 End stage renal disease: Secondary | ICD-10-CM | POA: Diagnosis not present

## 2019-07-09 DIAGNOSIS — Z4932 Encounter for adequacy testing for peritoneal dialysis: Secondary | ICD-10-CM | POA: Diagnosis not present

## 2019-07-09 DIAGNOSIS — D509 Iron deficiency anemia, unspecified: Secondary | ICD-10-CM | POA: Diagnosis not present

## 2019-07-09 DIAGNOSIS — Z992 Dependence on renal dialysis: Secondary | ICD-10-CM | POA: Diagnosis not present

## 2019-07-09 DIAGNOSIS — D631 Anemia in chronic kidney disease: Secondary | ICD-10-CM | POA: Diagnosis not present

## 2019-07-09 DIAGNOSIS — Z79899 Other long term (current) drug therapy: Secondary | ICD-10-CM | POA: Diagnosis not present

## 2019-07-10 DIAGNOSIS — E440 Moderate protein-calorie malnutrition: Secondary | ICD-10-CM | POA: Diagnosis not present

## 2019-07-10 DIAGNOSIS — D631 Anemia in chronic kidney disease: Secondary | ICD-10-CM | POA: Diagnosis not present

## 2019-07-10 DIAGNOSIS — N2581 Secondary hyperparathyroidism of renal origin: Secondary | ICD-10-CM | POA: Diagnosis not present

## 2019-07-10 DIAGNOSIS — Z992 Dependence on renal dialysis: Secondary | ICD-10-CM | POA: Diagnosis not present

## 2019-07-10 DIAGNOSIS — N2589 Other disorders resulting from impaired renal tubular function: Secondary | ICD-10-CM | POA: Diagnosis not present

## 2019-07-10 DIAGNOSIS — N186 End stage renal disease: Secondary | ICD-10-CM | POA: Diagnosis not present

## 2019-07-10 DIAGNOSIS — Z4932 Encounter for adequacy testing for peritoneal dialysis: Secondary | ICD-10-CM | POA: Diagnosis not present

## 2019-07-10 DIAGNOSIS — D509 Iron deficiency anemia, unspecified: Secondary | ICD-10-CM | POA: Diagnosis not present

## 2019-07-10 DIAGNOSIS — Z79899 Other long term (current) drug therapy: Secondary | ICD-10-CM | POA: Diagnosis not present

## 2019-07-11 ENCOUNTER — Other Ambulatory Visit: Payer: Self-pay

## 2019-07-11 ENCOUNTER — Ambulatory Visit (HOSPITAL_COMMUNITY): Payer: Medicare Other | Attending: Cardiovascular Disease

## 2019-07-11 ENCOUNTER — Ambulatory Visit (HOSPITAL_BASED_OUTPATIENT_CLINIC_OR_DEPARTMENT_OTHER): Payer: Medicare Other

## 2019-07-11 DIAGNOSIS — D631 Anemia in chronic kidney disease: Secondary | ICD-10-CM | POA: Diagnosis not present

## 2019-07-11 DIAGNOSIS — Z992 Dependence on renal dialysis: Secondary | ICD-10-CM | POA: Diagnosis not present

## 2019-07-11 DIAGNOSIS — N2589 Other disorders resulting from impaired renal tubular function: Secondary | ICD-10-CM | POA: Diagnosis not present

## 2019-07-11 DIAGNOSIS — Z4932 Encounter for adequacy testing for peritoneal dialysis: Secondary | ICD-10-CM | POA: Diagnosis not present

## 2019-07-11 DIAGNOSIS — D509 Iron deficiency anemia, unspecified: Secondary | ICD-10-CM | POA: Diagnosis not present

## 2019-07-11 DIAGNOSIS — Z01810 Encounter for preprocedural cardiovascular examination: Secondary | ICD-10-CM

## 2019-07-11 DIAGNOSIS — N186 End stage renal disease: Secondary | ICD-10-CM | POA: Diagnosis not present

## 2019-07-11 DIAGNOSIS — E440 Moderate protein-calorie malnutrition: Secondary | ICD-10-CM | POA: Diagnosis not present

## 2019-07-11 DIAGNOSIS — Z79899 Other long term (current) drug therapy: Secondary | ICD-10-CM | POA: Diagnosis not present

## 2019-07-11 DIAGNOSIS — N2581 Secondary hyperparathyroidism of renal origin: Secondary | ICD-10-CM | POA: Diagnosis not present

## 2019-07-12 DIAGNOSIS — D509 Iron deficiency anemia, unspecified: Secondary | ICD-10-CM | POA: Diagnosis not present

## 2019-07-12 DIAGNOSIS — Z4932 Encounter for adequacy testing for peritoneal dialysis: Secondary | ICD-10-CM | POA: Diagnosis not present

## 2019-07-12 DIAGNOSIS — N2581 Secondary hyperparathyroidism of renal origin: Secondary | ICD-10-CM | POA: Diagnosis not present

## 2019-07-12 DIAGNOSIS — N186 End stage renal disease: Secondary | ICD-10-CM | POA: Diagnosis not present

## 2019-07-12 DIAGNOSIS — Z79899 Other long term (current) drug therapy: Secondary | ICD-10-CM | POA: Diagnosis not present

## 2019-07-12 DIAGNOSIS — D631 Anemia in chronic kidney disease: Secondary | ICD-10-CM | POA: Diagnosis not present

## 2019-07-12 DIAGNOSIS — N2589 Other disorders resulting from impaired renal tubular function: Secondary | ICD-10-CM | POA: Diagnosis not present

## 2019-07-12 DIAGNOSIS — E440 Moderate protein-calorie malnutrition: Secondary | ICD-10-CM | POA: Diagnosis not present

## 2019-07-12 DIAGNOSIS — Z992 Dependence on renal dialysis: Secondary | ICD-10-CM | POA: Diagnosis not present

## 2019-07-13 DIAGNOSIS — N2589 Other disorders resulting from impaired renal tubular function: Secondary | ICD-10-CM | POA: Diagnosis not present

## 2019-07-13 DIAGNOSIS — E440 Moderate protein-calorie malnutrition: Secondary | ICD-10-CM | POA: Diagnosis not present

## 2019-07-13 DIAGNOSIS — Z992 Dependence on renal dialysis: Secondary | ICD-10-CM | POA: Diagnosis not present

## 2019-07-13 DIAGNOSIS — D631 Anemia in chronic kidney disease: Secondary | ICD-10-CM | POA: Diagnosis not present

## 2019-07-13 DIAGNOSIS — Z4932 Encounter for adequacy testing for peritoneal dialysis: Secondary | ICD-10-CM | POA: Diagnosis not present

## 2019-07-13 DIAGNOSIS — Z79899 Other long term (current) drug therapy: Secondary | ICD-10-CM | POA: Diagnosis not present

## 2019-07-13 DIAGNOSIS — N2581 Secondary hyperparathyroidism of renal origin: Secondary | ICD-10-CM | POA: Diagnosis not present

## 2019-07-13 DIAGNOSIS — N186 End stage renal disease: Secondary | ICD-10-CM | POA: Diagnosis not present

## 2019-07-13 DIAGNOSIS — D509 Iron deficiency anemia, unspecified: Secondary | ICD-10-CM | POA: Diagnosis not present

## 2019-07-14 DIAGNOSIS — N2581 Secondary hyperparathyroidism of renal origin: Secondary | ICD-10-CM | POA: Diagnosis not present

## 2019-07-14 DIAGNOSIS — D631 Anemia in chronic kidney disease: Secondary | ICD-10-CM | POA: Diagnosis not present

## 2019-07-14 DIAGNOSIS — E440 Moderate protein-calorie malnutrition: Secondary | ICD-10-CM | POA: Diagnosis not present

## 2019-07-14 DIAGNOSIS — Z79899 Other long term (current) drug therapy: Secondary | ICD-10-CM | POA: Diagnosis not present

## 2019-07-14 DIAGNOSIS — N2589 Other disorders resulting from impaired renal tubular function: Secondary | ICD-10-CM | POA: Diagnosis not present

## 2019-07-14 DIAGNOSIS — N186 End stage renal disease: Secondary | ICD-10-CM | POA: Diagnosis not present

## 2019-07-14 DIAGNOSIS — D509 Iron deficiency anemia, unspecified: Secondary | ICD-10-CM | POA: Diagnosis not present

## 2019-07-14 DIAGNOSIS — Z992 Dependence on renal dialysis: Secondary | ICD-10-CM | POA: Diagnosis not present

## 2019-07-14 DIAGNOSIS — Z4932 Encounter for adequacy testing for peritoneal dialysis: Secondary | ICD-10-CM | POA: Diagnosis not present

## 2019-07-15 DIAGNOSIS — D631 Anemia in chronic kidney disease: Secondary | ICD-10-CM | POA: Diagnosis not present

## 2019-07-15 DIAGNOSIS — Z992 Dependence on renal dialysis: Secondary | ICD-10-CM | POA: Diagnosis not present

## 2019-07-15 DIAGNOSIS — N2581 Secondary hyperparathyroidism of renal origin: Secondary | ICD-10-CM | POA: Diagnosis not present

## 2019-07-15 DIAGNOSIS — Z4932 Encounter for adequacy testing for peritoneal dialysis: Secondary | ICD-10-CM | POA: Diagnosis not present

## 2019-07-15 DIAGNOSIS — Z79899 Other long term (current) drug therapy: Secondary | ICD-10-CM | POA: Diagnosis not present

## 2019-07-15 DIAGNOSIS — D509 Iron deficiency anemia, unspecified: Secondary | ICD-10-CM | POA: Diagnosis not present

## 2019-07-15 DIAGNOSIS — N2589 Other disorders resulting from impaired renal tubular function: Secondary | ICD-10-CM | POA: Diagnosis not present

## 2019-07-15 DIAGNOSIS — N186 End stage renal disease: Secondary | ICD-10-CM | POA: Diagnosis not present

## 2019-07-15 DIAGNOSIS — E440 Moderate protein-calorie malnutrition: Secondary | ICD-10-CM | POA: Diagnosis not present

## 2019-07-16 DIAGNOSIS — D509 Iron deficiency anemia, unspecified: Secondary | ICD-10-CM | POA: Diagnosis not present

## 2019-07-16 DIAGNOSIS — Z79899 Other long term (current) drug therapy: Secondary | ICD-10-CM | POA: Diagnosis not present

## 2019-07-16 DIAGNOSIS — N2581 Secondary hyperparathyroidism of renal origin: Secondary | ICD-10-CM | POA: Diagnosis not present

## 2019-07-16 DIAGNOSIS — D631 Anemia in chronic kidney disease: Secondary | ICD-10-CM | POA: Diagnosis not present

## 2019-07-16 DIAGNOSIS — Z4932 Encounter for adequacy testing for peritoneal dialysis: Secondary | ICD-10-CM | POA: Diagnosis not present

## 2019-07-16 DIAGNOSIS — E440 Moderate protein-calorie malnutrition: Secondary | ICD-10-CM | POA: Diagnosis not present

## 2019-07-16 DIAGNOSIS — Z992 Dependence on renal dialysis: Secondary | ICD-10-CM | POA: Diagnosis not present

## 2019-07-16 DIAGNOSIS — N2589 Other disorders resulting from impaired renal tubular function: Secondary | ICD-10-CM | POA: Diagnosis not present

## 2019-07-16 DIAGNOSIS — N186 End stage renal disease: Secondary | ICD-10-CM | POA: Diagnosis not present

## 2019-07-17 DIAGNOSIS — Z79899 Other long term (current) drug therapy: Secondary | ICD-10-CM | POA: Diagnosis not present

## 2019-07-17 DIAGNOSIS — Z4932 Encounter for adequacy testing for peritoneal dialysis: Secondary | ICD-10-CM | POA: Diagnosis not present

## 2019-07-17 DIAGNOSIS — N186 End stage renal disease: Secondary | ICD-10-CM | POA: Diagnosis not present

## 2019-07-17 DIAGNOSIS — D631 Anemia in chronic kidney disease: Secondary | ICD-10-CM | POA: Diagnosis not present

## 2019-07-17 DIAGNOSIS — N2581 Secondary hyperparathyroidism of renal origin: Secondary | ICD-10-CM | POA: Diagnosis not present

## 2019-07-17 DIAGNOSIS — D509 Iron deficiency anemia, unspecified: Secondary | ICD-10-CM | POA: Diagnosis not present

## 2019-07-17 DIAGNOSIS — N2589 Other disorders resulting from impaired renal tubular function: Secondary | ICD-10-CM | POA: Diagnosis not present

## 2019-07-17 DIAGNOSIS — E440 Moderate protein-calorie malnutrition: Secondary | ICD-10-CM | POA: Diagnosis not present

## 2019-07-17 DIAGNOSIS — Z992 Dependence on renal dialysis: Secondary | ICD-10-CM | POA: Diagnosis not present

## 2019-07-18 DIAGNOSIS — E440 Moderate protein-calorie malnutrition: Secondary | ICD-10-CM | POA: Diagnosis not present

## 2019-07-18 DIAGNOSIS — Z4932 Encounter for adequacy testing for peritoneal dialysis: Secondary | ICD-10-CM | POA: Diagnosis not present

## 2019-07-18 DIAGNOSIS — N186 End stage renal disease: Secondary | ICD-10-CM | POA: Diagnosis not present

## 2019-07-18 DIAGNOSIS — D509 Iron deficiency anemia, unspecified: Secondary | ICD-10-CM | POA: Diagnosis not present

## 2019-07-18 DIAGNOSIS — Z992 Dependence on renal dialysis: Secondary | ICD-10-CM | POA: Diagnosis not present

## 2019-07-18 DIAGNOSIS — N2581 Secondary hyperparathyroidism of renal origin: Secondary | ICD-10-CM | POA: Diagnosis not present

## 2019-07-18 DIAGNOSIS — Z79899 Other long term (current) drug therapy: Secondary | ICD-10-CM | POA: Diagnosis not present

## 2019-07-18 DIAGNOSIS — N2589 Other disorders resulting from impaired renal tubular function: Secondary | ICD-10-CM | POA: Diagnosis not present

## 2019-07-18 DIAGNOSIS — D631 Anemia in chronic kidney disease: Secondary | ICD-10-CM | POA: Diagnosis not present

## 2019-07-19 DIAGNOSIS — E440 Moderate protein-calorie malnutrition: Secondary | ICD-10-CM | POA: Diagnosis not present

## 2019-07-19 DIAGNOSIS — N2581 Secondary hyperparathyroidism of renal origin: Secondary | ICD-10-CM | POA: Diagnosis not present

## 2019-07-19 DIAGNOSIS — Z79899 Other long term (current) drug therapy: Secondary | ICD-10-CM | POA: Diagnosis not present

## 2019-07-19 DIAGNOSIS — N186 End stage renal disease: Secondary | ICD-10-CM | POA: Diagnosis not present

## 2019-07-19 DIAGNOSIS — Z992 Dependence on renal dialysis: Secondary | ICD-10-CM | POA: Diagnosis not present

## 2019-07-19 DIAGNOSIS — D509 Iron deficiency anemia, unspecified: Secondary | ICD-10-CM | POA: Diagnosis not present

## 2019-07-19 DIAGNOSIS — D631 Anemia in chronic kidney disease: Secondary | ICD-10-CM | POA: Diagnosis not present

## 2019-07-19 DIAGNOSIS — N2589 Other disorders resulting from impaired renal tubular function: Secondary | ICD-10-CM | POA: Diagnosis not present

## 2019-07-19 DIAGNOSIS — Z4932 Encounter for adequacy testing for peritoneal dialysis: Secondary | ICD-10-CM | POA: Diagnosis not present

## 2019-07-20 DIAGNOSIS — D631 Anemia in chronic kidney disease: Secondary | ICD-10-CM | POA: Diagnosis not present

## 2019-07-20 DIAGNOSIS — Z992 Dependence on renal dialysis: Secondary | ICD-10-CM | POA: Diagnosis not present

## 2019-07-20 DIAGNOSIS — E440 Moderate protein-calorie malnutrition: Secondary | ICD-10-CM | POA: Diagnosis not present

## 2019-07-20 DIAGNOSIS — Z4932 Encounter for adequacy testing for peritoneal dialysis: Secondary | ICD-10-CM | POA: Diagnosis not present

## 2019-07-20 DIAGNOSIS — N186 End stage renal disease: Secondary | ICD-10-CM | POA: Diagnosis not present

## 2019-07-20 DIAGNOSIS — N2589 Other disorders resulting from impaired renal tubular function: Secondary | ICD-10-CM | POA: Diagnosis not present

## 2019-07-20 DIAGNOSIS — D509 Iron deficiency anemia, unspecified: Secondary | ICD-10-CM | POA: Diagnosis not present

## 2019-07-20 DIAGNOSIS — Z79899 Other long term (current) drug therapy: Secondary | ICD-10-CM | POA: Diagnosis not present

## 2019-07-20 DIAGNOSIS — N2581 Secondary hyperparathyroidism of renal origin: Secondary | ICD-10-CM | POA: Diagnosis not present

## 2019-07-21 DIAGNOSIS — N186 End stage renal disease: Secondary | ICD-10-CM | POA: Diagnosis not present

## 2019-07-21 DIAGNOSIS — D631 Anemia in chronic kidney disease: Secondary | ICD-10-CM | POA: Diagnosis not present

## 2019-07-21 DIAGNOSIS — E440 Moderate protein-calorie malnutrition: Secondary | ICD-10-CM | POA: Diagnosis not present

## 2019-07-21 DIAGNOSIS — Z4932 Encounter for adequacy testing for peritoneal dialysis: Secondary | ICD-10-CM | POA: Diagnosis not present

## 2019-07-21 DIAGNOSIS — D509 Iron deficiency anemia, unspecified: Secondary | ICD-10-CM | POA: Diagnosis not present

## 2019-07-21 DIAGNOSIS — N2581 Secondary hyperparathyroidism of renal origin: Secondary | ICD-10-CM | POA: Diagnosis not present

## 2019-07-21 DIAGNOSIS — Z79899 Other long term (current) drug therapy: Secondary | ICD-10-CM | POA: Diagnosis not present

## 2019-07-21 DIAGNOSIS — Z992 Dependence on renal dialysis: Secondary | ICD-10-CM | POA: Diagnosis not present

## 2019-07-21 DIAGNOSIS — N2589 Other disorders resulting from impaired renal tubular function: Secondary | ICD-10-CM | POA: Diagnosis not present

## 2019-07-22 DIAGNOSIS — E440 Moderate protein-calorie malnutrition: Secondary | ICD-10-CM | POA: Diagnosis not present

## 2019-07-22 DIAGNOSIS — Z79899 Other long term (current) drug therapy: Secondary | ICD-10-CM | POA: Diagnosis not present

## 2019-07-22 DIAGNOSIS — N2589 Other disorders resulting from impaired renal tubular function: Secondary | ICD-10-CM | POA: Diagnosis not present

## 2019-07-22 DIAGNOSIS — D631 Anemia in chronic kidney disease: Secondary | ICD-10-CM | POA: Diagnosis not present

## 2019-07-22 DIAGNOSIS — D509 Iron deficiency anemia, unspecified: Secondary | ICD-10-CM | POA: Diagnosis not present

## 2019-07-22 DIAGNOSIS — N2581 Secondary hyperparathyroidism of renal origin: Secondary | ICD-10-CM | POA: Diagnosis not present

## 2019-07-22 DIAGNOSIS — N186 End stage renal disease: Secondary | ICD-10-CM | POA: Diagnosis not present

## 2019-07-22 DIAGNOSIS — Z4932 Encounter for adequacy testing for peritoneal dialysis: Secondary | ICD-10-CM | POA: Diagnosis not present

## 2019-07-22 DIAGNOSIS — Z992 Dependence on renal dialysis: Secondary | ICD-10-CM | POA: Diagnosis not present

## 2019-07-23 DIAGNOSIS — D631 Anemia in chronic kidney disease: Secondary | ICD-10-CM | POA: Diagnosis not present

## 2019-07-23 DIAGNOSIS — Z992 Dependence on renal dialysis: Secondary | ICD-10-CM | POA: Diagnosis not present

## 2019-07-23 DIAGNOSIS — D509 Iron deficiency anemia, unspecified: Secondary | ICD-10-CM | POA: Diagnosis not present

## 2019-07-23 DIAGNOSIS — Z79899 Other long term (current) drug therapy: Secondary | ICD-10-CM | POA: Diagnosis not present

## 2019-07-23 DIAGNOSIS — E440 Moderate protein-calorie malnutrition: Secondary | ICD-10-CM | POA: Diagnosis not present

## 2019-07-23 DIAGNOSIS — N2589 Other disorders resulting from impaired renal tubular function: Secondary | ICD-10-CM | POA: Diagnosis not present

## 2019-07-23 DIAGNOSIS — N2581 Secondary hyperparathyroidism of renal origin: Secondary | ICD-10-CM | POA: Diagnosis not present

## 2019-07-23 DIAGNOSIS — Z4932 Encounter for adequacy testing for peritoneal dialysis: Secondary | ICD-10-CM | POA: Diagnosis not present

## 2019-07-23 DIAGNOSIS — N186 End stage renal disease: Secondary | ICD-10-CM | POA: Diagnosis not present

## 2019-07-24 DIAGNOSIS — N186 End stage renal disease: Secondary | ICD-10-CM | POA: Diagnosis not present

## 2019-07-24 DIAGNOSIS — E440 Moderate protein-calorie malnutrition: Secondary | ICD-10-CM | POA: Diagnosis not present

## 2019-07-24 DIAGNOSIS — N2589 Other disorders resulting from impaired renal tubular function: Secondary | ICD-10-CM | POA: Diagnosis not present

## 2019-07-24 DIAGNOSIS — Z79899 Other long term (current) drug therapy: Secondary | ICD-10-CM | POA: Diagnosis not present

## 2019-07-24 DIAGNOSIS — Z992 Dependence on renal dialysis: Secondary | ICD-10-CM | POA: Diagnosis not present

## 2019-07-24 DIAGNOSIS — N2581 Secondary hyperparathyroidism of renal origin: Secondary | ICD-10-CM | POA: Diagnosis not present

## 2019-07-24 DIAGNOSIS — Z4932 Encounter for adequacy testing for peritoneal dialysis: Secondary | ICD-10-CM | POA: Diagnosis not present

## 2019-07-24 DIAGNOSIS — D631 Anemia in chronic kidney disease: Secondary | ICD-10-CM | POA: Diagnosis not present

## 2019-07-24 DIAGNOSIS — D509 Iron deficiency anemia, unspecified: Secondary | ICD-10-CM | POA: Diagnosis not present

## 2019-07-25 DIAGNOSIS — D509 Iron deficiency anemia, unspecified: Secondary | ICD-10-CM | POA: Diagnosis not present

## 2019-07-25 DIAGNOSIS — N186 End stage renal disease: Secondary | ICD-10-CM | POA: Diagnosis not present

## 2019-07-25 DIAGNOSIS — D631 Anemia in chronic kidney disease: Secondary | ICD-10-CM | POA: Diagnosis not present

## 2019-07-25 DIAGNOSIS — Z4932 Encounter for adequacy testing for peritoneal dialysis: Secondary | ICD-10-CM | POA: Diagnosis not present

## 2019-07-25 DIAGNOSIS — Z79899 Other long term (current) drug therapy: Secondary | ICD-10-CM | POA: Diagnosis not present

## 2019-07-25 DIAGNOSIS — Z992 Dependence on renal dialysis: Secondary | ICD-10-CM | POA: Diagnosis not present

## 2019-07-25 DIAGNOSIS — N2581 Secondary hyperparathyroidism of renal origin: Secondary | ICD-10-CM | POA: Diagnosis not present

## 2019-07-25 DIAGNOSIS — E440 Moderate protein-calorie malnutrition: Secondary | ICD-10-CM | POA: Diagnosis not present

## 2019-07-25 DIAGNOSIS — N2589 Other disorders resulting from impaired renal tubular function: Secondary | ICD-10-CM | POA: Diagnosis not present

## 2019-07-26 DIAGNOSIS — E440 Moderate protein-calorie malnutrition: Secondary | ICD-10-CM | POA: Diagnosis not present

## 2019-07-26 DIAGNOSIS — N186 End stage renal disease: Secondary | ICD-10-CM | POA: Diagnosis not present

## 2019-07-26 DIAGNOSIS — N2581 Secondary hyperparathyroidism of renal origin: Secondary | ICD-10-CM | POA: Diagnosis not present

## 2019-07-26 DIAGNOSIS — Z4932 Encounter for adequacy testing for peritoneal dialysis: Secondary | ICD-10-CM | POA: Diagnosis not present

## 2019-07-26 DIAGNOSIS — D509 Iron deficiency anemia, unspecified: Secondary | ICD-10-CM | POA: Diagnosis not present

## 2019-07-26 DIAGNOSIS — Z79899 Other long term (current) drug therapy: Secondary | ICD-10-CM | POA: Diagnosis not present

## 2019-07-26 DIAGNOSIS — D631 Anemia in chronic kidney disease: Secondary | ICD-10-CM | POA: Diagnosis not present

## 2019-07-26 DIAGNOSIS — N2589 Other disorders resulting from impaired renal tubular function: Secondary | ICD-10-CM | POA: Diagnosis not present

## 2019-07-26 DIAGNOSIS — Z992 Dependence on renal dialysis: Secondary | ICD-10-CM | POA: Diagnosis not present

## 2019-07-27 DIAGNOSIS — E440 Moderate protein-calorie malnutrition: Secondary | ICD-10-CM | POA: Diagnosis not present

## 2019-07-27 DIAGNOSIS — Z4932 Encounter for adequacy testing for peritoneal dialysis: Secondary | ICD-10-CM | POA: Diagnosis not present

## 2019-07-27 DIAGNOSIS — Z992 Dependence on renal dialysis: Secondary | ICD-10-CM | POA: Diagnosis not present

## 2019-07-27 DIAGNOSIS — N2581 Secondary hyperparathyroidism of renal origin: Secondary | ICD-10-CM | POA: Diagnosis not present

## 2019-07-27 DIAGNOSIS — Z79899 Other long term (current) drug therapy: Secondary | ICD-10-CM | POA: Diagnosis not present

## 2019-07-27 DIAGNOSIS — N2589 Other disorders resulting from impaired renal tubular function: Secondary | ICD-10-CM | POA: Diagnosis not present

## 2019-07-27 DIAGNOSIS — D509 Iron deficiency anemia, unspecified: Secondary | ICD-10-CM | POA: Diagnosis not present

## 2019-07-27 DIAGNOSIS — N186 End stage renal disease: Secondary | ICD-10-CM | POA: Diagnosis not present

## 2019-07-27 DIAGNOSIS — D631 Anemia in chronic kidney disease: Secondary | ICD-10-CM | POA: Diagnosis not present

## 2019-07-28 DIAGNOSIS — Z992 Dependence on renal dialysis: Secondary | ICD-10-CM | POA: Diagnosis not present

## 2019-07-28 DIAGNOSIS — E440 Moderate protein-calorie malnutrition: Secondary | ICD-10-CM | POA: Diagnosis not present

## 2019-07-28 DIAGNOSIS — Z79899 Other long term (current) drug therapy: Secondary | ICD-10-CM | POA: Diagnosis not present

## 2019-07-28 DIAGNOSIS — Z4932 Encounter for adequacy testing for peritoneal dialysis: Secondary | ICD-10-CM | POA: Diagnosis not present

## 2019-07-28 DIAGNOSIS — N186 End stage renal disease: Secondary | ICD-10-CM | POA: Diagnosis not present

## 2019-07-28 DIAGNOSIS — D631 Anemia in chronic kidney disease: Secondary | ICD-10-CM | POA: Diagnosis not present

## 2019-07-28 DIAGNOSIS — N2589 Other disorders resulting from impaired renal tubular function: Secondary | ICD-10-CM | POA: Diagnosis not present

## 2019-07-28 DIAGNOSIS — N2581 Secondary hyperparathyroidism of renal origin: Secondary | ICD-10-CM | POA: Diagnosis not present

## 2019-07-28 DIAGNOSIS — D509 Iron deficiency anemia, unspecified: Secondary | ICD-10-CM | POA: Diagnosis not present

## 2019-07-29 DIAGNOSIS — Z4932 Encounter for adequacy testing for peritoneal dialysis: Secondary | ICD-10-CM | POA: Diagnosis not present

## 2019-07-29 DIAGNOSIS — Z992 Dependence on renal dialysis: Secondary | ICD-10-CM | POA: Diagnosis not present

## 2019-07-29 DIAGNOSIS — Z79899 Other long term (current) drug therapy: Secondary | ICD-10-CM | POA: Diagnosis not present

## 2019-07-29 DIAGNOSIS — E440 Moderate protein-calorie malnutrition: Secondary | ICD-10-CM | POA: Diagnosis not present

## 2019-07-29 DIAGNOSIS — D509 Iron deficiency anemia, unspecified: Secondary | ICD-10-CM | POA: Diagnosis not present

## 2019-07-29 DIAGNOSIS — D631 Anemia in chronic kidney disease: Secondary | ICD-10-CM | POA: Diagnosis not present

## 2019-07-29 DIAGNOSIS — N186 End stage renal disease: Secondary | ICD-10-CM | POA: Diagnosis not present

## 2019-07-29 DIAGNOSIS — N2589 Other disorders resulting from impaired renal tubular function: Secondary | ICD-10-CM | POA: Diagnosis not present

## 2019-07-29 DIAGNOSIS — N2581 Secondary hyperparathyroidism of renal origin: Secondary | ICD-10-CM | POA: Diagnosis not present

## 2019-07-30 DIAGNOSIS — E440 Moderate protein-calorie malnutrition: Secondary | ICD-10-CM | POA: Diagnosis not present

## 2019-07-30 DIAGNOSIS — D631 Anemia in chronic kidney disease: Secondary | ICD-10-CM | POA: Diagnosis not present

## 2019-07-30 DIAGNOSIS — D509 Iron deficiency anemia, unspecified: Secondary | ICD-10-CM | POA: Diagnosis not present

## 2019-07-30 DIAGNOSIS — Z992 Dependence on renal dialysis: Secondary | ICD-10-CM | POA: Diagnosis not present

## 2019-07-30 DIAGNOSIS — Z4932 Encounter for adequacy testing for peritoneal dialysis: Secondary | ICD-10-CM | POA: Diagnosis not present

## 2019-07-30 DIAGNOSIS — N186 End stage renal disease: Secondary | ICD-10-CM | POA: Diagnosis not present

## 2019-07-30 DIAGNOSIS — Z79899 Other long term (current) drug therapy: Secondary | ICD-10-CM | POA: Diagnosis not present

## 2019-07-30 DIAGNOSIS — N2581 Secondary hyperparathyroidism of renal origin: Secondary | ICD-10-CM | POA: Diagnosis not present

## 2019-07-30 DIAGNOSIS — N2589 Other disorders resulting from impaired renal tubular function: Secondary | ICD-10-CM | POA: Diagnosis not present

## 2019-07-31 DIAGNOSIS — N2589 Other disorders resulting from impaired renal tubular function: Secondary | ICD-10-CM | POA: Diagnosis not present

## 2019-07-31 DIAGNOSIS — D509 Iron deficiency anemia, unspecified: Secondary | ICD-10-CM | POA: Diagnosis not present

## 2019-07-31 DIAGNOSIS — N186 End stage renal disease: Secondary | ICD-10-CM | POA: Diagnosis not present

## 2019-07-31 DIAGNOSIS — D631 Anemia in chronic kidney disease: Secondary | ICD-10-CM | POA: Diagnosis not present

## 2019-07-31 DIAGNOSIS — Z992 Dependence on renal dialysis: Secondary | ICD-10-CM | POA: Diagnosis not present

## 2019-07-31 DIAGNOSIS — Z4932 Encounter for adequacy testing for peritoneal dialysis: Secondary | ICD-10-CM | POA: Diagnosis not present

## 2019-07-31 DIAGNOSIS — E440 Moderate protein-calorie malnutrition: Secondary | ICD-10-CM | POA: Diagnosis not present

## 2019-07-31 DIAGNOSIS — Z79899 Other long term (current) drug therapy: Secondary | ICD-10-CM | POA: Diagnosis not present

## 2019-07-31 DIAGNOSIS — N2581 Secondary hyperparathyroidism of renal origin: Secondary | ICD-10-CM | POA: Diagnosis not present

## 2019-08-01 DIAGNOSIS — Z79899 Other long term (current) drug therapy: Secondary | ICD-10-CM | POA: Diagnosis not present

## 2019-08-01 DIAGNOSIS — E440 Moderate protein-calorie malnutrition: Secondary | ICD-10-CM | POA: Diagnosis not present

## 2019-08-01 DIAGNOSIS — N186 End stage renal disease: Secondary | ICD-10-CM | POA: Diagnosis not present

## 2019-08-01 DIAGNOSIS — N2581 Secondary hyperparathyroidism of renal origin: Secondary | ICD-10-CM | POA: Diagnosis not present

## 2019-08-01 DIAGNOSIS — N2589 Other disorders resulting from impaired renal tubular function: Secondary | ICD-10-CM | POA: Diagnosis not present

## 2019-08-01 DIAGNOSIS — D631 Anemia in chronic kidney disease: Secondary | ICD-10-CM | POA: Diagnosis not present

## 2019-08-01 DIAGNOSIS — Z992 Dependence on renal dialysis: Secondary | ICD-10-CM | POA: Diagnosis not present

## 2019-08-01 DIAGNOSIS — Z4932 Encounter for adequacy testing for peritoneal dialysis: Secondary | ICD-10-CM | POA: Diagnosis not present

## 2019-08-01 DIAGNOSIS — D509 Iron deficiency anemia, unspecified: Secondary | ICD-10-CM | POA: Diagnosis not present

## 2019-08-02 DIAGNOSIS — D631 Anemia in chronic kidney disease: Secondary | ICD-10-CM | POA: Diagnosis not present

## 2019-08-02 DIAGNOSIS — Z992 Dependence on renal dialysis: Secondary | ICD-10-CM | POA: Diagnosis not present

## 2019-08-02 DIAGNOSIS — E440 Moderate protein-calorie malnutrition: Secondary | ICD-10-CM | POA: Diagnosis not present

## 2019-08-02 DIAGNOSIS — Z4932 Encounter for adequacy testing for peritoneal dialysis: Secondary | ICD-10-CM | POA: Diagnosis not present

## 2019-08-02 DIAGNOSIS — N2581 Secondary hyperparathyroidism of renal origin: Secondary | ICD-10-CM | POA: Diagnosis not present

## 2019-08-02 DIAGNOSIS — D509 Iron deficiency anemia, unspecified: Secondary | ICD-10-CM | POA: Diagnosis not present

## 2019-08-02 DIAGNOSIS — N186 End stage renal disease: Secondary | ICD-10-CM | POA: Diagnosis not present

## 2019-08-02 DIAGNOSIS — Z79899 Other long term (current) drug therapy: Secondary | ICD-10-CM | POA: Diagnosis not present

## 2019-08-02 DIAGNOSIS — N2589 Other disorders resulting from impaired renal tubular function: Secondary | ICD-10-CM | POA: Diagnosis not present

## 2019-08-03 DIAGNOSIS — E440 Moderate protein-calorie malnutrition: Secondary | ICD-10-CM | POA: Diagnosis not present

## 2019-08-03 DIAGNOSIS — D509 Iron deficiency anemia, unspecified: Secondary | ICD-10-CM | POA: Diagnosis not present

## 2019-08-03 DIAGNOSIS — Z4932 Encounter for adequacy testing for peritoneal dialysis: Secondary | ICD-10-CM | POA: Diagnosis not present

## 2019-08-03 DIAGNOSIS — D631 Anemia in chronic kidney disease: Secondary | ICD-10-CM | POA: Diagnosis not present

## 2019-08-03 DIAGNOSIS — N2589 Other disorders resulting from impaired renal tubular function: Secondary | ICD-10-CM | POA: Diagnosis not present

## 2019-08-03 DIAGNOSIS — N186 End stage renal disease: Secondary | ICD-10-CM | POA: Diagnosis not present

## 2019-08-03 DIAGNOSIS — Z992 Dependence on renal dialysis: Secondary | ICD-10-CM | POA: Diagnosis not present

## 2019-08-03 DIAGNOSIS — Z79899 Other long term (current) drug therapy: Secondary | ICD-10-CM | POA: Diagnosis not present

## 2019-08-03 DIAGNOSIS — N2581 Secondary hyperparathyroidism of renal origin: Secondary | ICD-10-CM | POA: Diagnosis not present

## 2019-08-04 DIAGNOSIS — N2581 Secondary hyperparathyroidism of renal origin: Secondary | ICD-10-CM | POA: Diagnosis not present

## 2019-08-04 DIAGNOSIS — N186 End stage renal disease: Secondary | ICD-10-CM | POA: Diagnosis not present

## 2019-08-04 DIAGNOSIS — D509 Iron deficiency anemia, unspecified: Secondary | ICD-10-CM | POA: Diagnosis not present

## 2019-08-04 DIAGNOSIS — D631 Anemia in chronic kidney disease: Secondary | ICD-10-CM | POA: Diagnosis not present

## 2019-08-04 DIAGNOSIS — Z4932 Encounter for adequacy testing for peritoneal dialysis: Secondary | ICD-10-CM | POA: Diagnosis not present

## 2019-08-04 DIAGNOSIS — E440 Moderate protein-calorie malnutrition: Secondary | ICD-10-CM | POA: Diagnosis not present

## 2019-08-04 DIAGNOSIS — N2589 Other disorders resulting from impaired renal tubular function: Secondary | ICD-10-CM | POA: Diagnosis not present

## 2019-08-04 DIAGNOSIS — Z79899 Other long term (current) drug therapy: Secondary | ICD-10-CM | POA: Diagnosis not present

## 2019-08-04 DIAGNOSIS — Z992 Dependence on renal dialysis: Secondary | ICD-10-CM | POA: Diagnosis not present

## 2019-08-05 DIAGNOSIS — Z79899 Other long term (current) drug therapy: Secondary | ICD-10-CM | POA: Diagnosis not present

## 2019-08-05 DIAGNOSIS — Z4932 Encounter for adequacy testing for peritoneal dialysis: Secondary | ICD-10-CM | POA: Diagnosis not present

## 2019-08-05 DIAGNOSIS — N186 End stage renal disease: Secondary | ICD-10-CM | POA: Diagnosis not present

## 2019-08-05 DIAGNOSIS — D631 Anemia in chronic kidney disease: Secondary | ICD-10-CM | POA: Diagnosis not present

## 2019-08-05 DIAGNOSIS — D509 Iron deficiency anemia, unspecified: Secondary | ICD-10-CM | POA: Diagnosis not present

## 2019-08-05 DIAGNOSIS — N2589 Other disorders resulting from impaired renal tubular function: Secondary | ICD-10-CM | POA: Diagnosis not present

## 2019-08-05 DIAGNOSIS — Z992 Dependence on renal dialysis: Secondary | ICD-10-CM | POA: Diagnosis not present

## 2019-08-05 DIAGNOSIS — N2581 Secondary hyperparathyroidism of renal origin: Secondary | ICD-10-CM | POA: Diagnosis not present

## 2019-08-05 DIAGNOSIS — E440 Moderate protein-calorie malnutrition: Secondary | ICD-10-CM | POA: Diagnosis not present

## 2019-08-05 DIAGNOSIS — I129 Hypertensive chronic kidney disease with stage 1 through stage 4 chronic kidney disease, or unspecified chronic kidney disease: Secondary | ICD-10-CM | POA: Diagnosis not present

## 2019-08-06 DIAGNOSIS — E440 Moderate protein-calorie malnutrition: Secondary | ICD-10-CM | POA: Diagnosis not present

## 2019-08-06 DIAGNOSIS — N2581 Secondary hyperparathyroidism of renal origin: Secondary | ICD-10-CM | POA: Diagnosis not present

## 2019-08-06 DIAGNOSIS — D631 Anemia in chronic kidney disease: Secondary | ICD-10-CM | POA: Diagnosis not present

## 2019-08-06 DIAGNOSIS — N2589 Other disorders resulting from impaired renal tubular function: Secondary | ICD-10-CM | POA: Diagnosis not present

## 2019-08-06 DIAGNOSIS — Z79899 Other long term (current) drug therapy: Secondary | ICD-10-CM | POA: Diagnosis not present

## 2019-08-06 DIAGNOSIS — D509 Iron deficiency anemia, unspecified: Secondary | ICD-10-CM | POA: Diagnosis not present

## 2019-08-06 DIAGNOSIS — Z992 Dependence on renal dialysis: Secondary | ICD-10-CM | POA: Diagnosis not present

## 2019-08-06 DIAGNOSIS — E7841 Elevated Lipoprotein(a): Secondary | ICD-10-CM | POA: Diagnosis not present

## 2019-08-06 DIAGNOSIS — N186 End stage renal disease: Secondary | ICD-10-CM | POA: Diagnosis not present

## 2019-08-07 DIAGNOSIS — Z79899 Other long term (current) drug therapy: Secondary | ICD-10-CM | POA: Diagnosis not present

## 2019-08-07 DIAGNOSIS — N186 End stage renal disease: Secondary | ICD-10-CM | POA: Diagnosis not present

## 2019-08-07 DIAGNOSIS — Z992 Dependence on renal dialysis: Secondary | ICD-10-CM | POA: Diagnosis not present

## 2019-08-07 DIAGNOSIS — D631 Anemia in chronic kidney disease: Secondary | ICD-10-CM | POA: Diagnosis not present

## 2019-08-07 DIAGNOSIS — D509 Iron deficiency anemia, unspecified: Secondary | ICD-10-CM | POA: Diagnosis not present

## 2019-08-07 DIAGNOSIS — E440 Moderate protein-calorie malnutrition: Secondary | ICD-10-CM | POA: Diagnosis not present

## 2019-08-07 DIAGNOSIS — E7841 Elevated Lipoprotein(a): Secondary | ICD-10-CM | POA: Diagnosis not present

## 2019-08-07 DIAGNOSIS — N2589 Other disorders resulting from impaired renal tubular function: Secondary | ICD-10-CM | POA: Diagnosis not present

## 2019-08-07 DIAGNOSIS — N2581 Secondary hyperparathyroidism of renal origin: Secondary | ICD-10-CM | POA: Diagnosis not present

## 2019-08-08 DIAGNOSIS — Z992 Dependence on renal dialysis: Secondary | ICD-10-CM | POA: Diagnosis not present

## 2019-08-08 DIAGNOSIS — N186 End stage renal disease: Secondary | ICD-10-CM | POA: Diagnosis not present

## 2019-08-08 DIAGNOSIS — N2589 Other disorders resulting from impaired renal tubular function: Secondary | ICD-10-CM | POA: Diagnosis not present

## 2019-08-08 DIAGNOSIS — D509 Iron deficiency anemia, unspecified: Secondary | ICD-10-CM | POA: Diagnosis not present

## 2019-08-08 DIAGNOSIS — E440 Moderate protein-calorie malnutrition: Secondary | ICD-10-CM | POA: Diagnosis not present

## 2019-08-08 DIAGNOSIS — Z79899 Other long term (current) drug therapy: Secondary | ICD-10-CM | POA: Diagnosis not present

## 2019-08-08 DIAGNOSIS — N2581 Secondary hyperparathyroidism of renal origin: Secondary | ICD-10-CM | POA: Diagnosis not present

## 2019-08-08 DIAGNOSIS — D631 Anemia in chronic kidney disease: Secondary | ICD-10-CM | POA: Diagnosis not present

## 2019-08-08 DIAGNOSIS — E7841 Elevated Lipoprotein(a): Secondary | ICD-10-CM | POA: Diagnosis not present

## 2019-08-09 DIAGNOSIS — Z79899 Other long term (current) drug therapy: Secondary | ICD-10-CM | POA: Diagnosis not present

## 2019-08-09 DIAGNOSIS — Z992 Dependence on renal dialysis: Secondary | ICD-10-CM | POA: Diagnosis not present

## 2019-08-09 DIAGNOSIS — N2581 Secondary hyperparathyroidism of renal origin: Secondary | ICD-10-CM | POA: Diagnosis not present

## 2019-08-09 DIAGNOSIS — E7841 Elevated Lipoprotein(a): Secondary | ICD-10-CM | POA: Diagnosis not present

## 2019-08-09 DIAGNOSIS — D509 Iron deficiency anemia, unspecified: Secondary | ICD-10-CM | POA: Diagnosis not present

## 2019-08-09 DIAGNOSIS — N2589 Other disorders resulting from impaired renal tubular function: Secondary | ICD-10-CM | POA: Diagnosis not present

## 2019-08-09 DIAGNOSIS — E440 Moderate protein-calorie malnutrition: Secondary | ICD-10-CM | POA: Diagnosis not present

## 2019-08-09 DIAGNOSIS — N186 End stage renal disease: Secondary | ICD-10-CM | POA: Diagnosis not present

## 2019-08-09 DIAGNOSIS — D631 Anemia in chronic kidney disease: Secondary | ICD-10-CM | POA: Diagnosis not present

## 2019-08-10 DIAGNOSIS — D631 Anemia in chronic kidney disease: Secondary | ICD-10-CM | POA: Diagnosis not present

## 2019-08-10 DIAGNOSIS — E7841 Elevated Lipoprotein(a): Secondary | ICD-10-CM | POA: Diagnosis not present

## 2019-08-10 DIAGNOSIS — N2581 Secondary hyperparathyroidism of renal origin: Secondary | ICD-10-CM | POA: Diagnosis not present

## 2019-08-10 DIAGNOSIS — Z992 Dependence on renal dialysis: Secondary | ICD-10-CM | POA: Diagnosis not present

## 2019-08-10 DIAGNOSIS — E440 Moderate protein-calorie malnutrition: Secondary | ICD-10-CM | POA: Diagnosis not present

## 2019-08-10 DIAGNOSIS — Z79899 Other long term (current) drug therapy: Secondary | ICD-10-CM | POA: Diagnosis not present

## 2019-08-10 DIAGNOSIS — N186 End stage renal disease: Secondary | ICD-10-CM | POA: Diagnosis not present

## 2019-08-10 DIAGNOSIS — N2589 Other disorders resulting from impaired renal tubular function: Secondary | ICD-10-CM | POA: Diagnosis not present

## 2019-08-10 DIAGNOSIS — D509 Iron deficiency anemia, unspecified: Secondary | ICD-10-CM | POA: Diagnosis not present

## 2019-08-11 DIAGNOSIS — Z992 Dependence on renal dialysis: Secondary | ICD-10-CM | POA: Diagnosis not present

## 2019-08-11 DIAGNOSIS — N2589 Other disorders resulting from impaired renal tubular function: Secondary | ICD-10-CM | POA: Diagnosis not present

## 2019-08-11 DIAGNOSIS — N186 End stage renal disease: Secondary | ICD-10-CM | POA: Diagnosis not present

## 2019-08-11 DIAGNOSIS — D631 Anemia in chronic kidney disease: Secondary | ICD-10-CM | POA: Diagnosis not present

## 2019-08-11 DIAGNOSIS — E440 Moderate protein-calorie malnutrition: Secondary | ICD-10-CM | POA: Diagnosis not present

## 2019-08-11 DIAGNOSIS — Z79899 Other long term (current) drug therapy: Secondary | ICD-10-CM | POA: Diagnosis not present

## 2019-08-11 DIAGNOSIS — D509 Iron deficiency anemia, unspecified: Secondary | ICD-10-CM | POA: Diagnosis not present

## 2019-08-11 DIAGNOSIS — N2581 Secondary hyperparathyroidism of renal origin: Secondary | ICD-10-CM | POA: Diagnosis not present

## 2019-08-11 DIAGNOSIS — E7841 Elevated Lipoprotein(a): Secondary | ICD-10-CM | POA: Diagnosis not present

## 2019-08-12 DIAGNOSIS — E440 Moderate protein-calorie malnutrition: Secondary | ICD-10-CM | POA: Diagnosis not present

## 2019-08-12 DIAGNOSIS — D631 Anemia in chronic kidney disease: Secondary | ICD-10-CM | POA: Diagnosis not present

## 2019-08-12 DIAGNOSIS — Z79899 Other long term (current) drug therapy: Secondary | ICD-10-CM | POA: Diagnosis not present

## 2019-08-12 DIAGNOSIS — N2589 Other disorders resulting from impaired renal tubular function: Secondary | ICD-10-CM | POA: Diagnosis not present

## 2019-08-12 DIAGNOSIS — Z992 Dependence on renal dialysis: Secondary | ICD-10-CM | POA: Diagnosis not present

## 2019-08-12 DIAGNOSIS — N2581 Secondary hyperparathyroidism of renal origin: Secondary | ICD-10-CM | POA: Diagnosis not present

## 2019-08-12 DIAGNOSIS — N186 End stage renal disease: Secondary | ICD-10-CM | POA: Diagnosis not present

## 2019-08-12 DIAGNOSIS — D509 Iron deficiency anemia, unspecified: Secondary | ICD-10-CM | POA: Diagnosis not present

## 2019-08-12 DIAGNOSIS — E7841 Elevated Lipoprotein(a): Secondary | ICD-10-CM | POA: Diagnosis not present

## 2019-08-13 DIAGNOSIS — N2589 Other disorders resulting from impaired renal tubular function: Secondary | ICD-10-CM | POA: Diagnosis not present

## 2019-08-13 DIAGNOSIS — E440 Moderate protein-calorie malnutrition: Secondary | ICD-10-CM | POA: Diagnosis not present

## 2019-08-13 DIAGNOSIS — N2581 Secondary hyperparathyroidism of renal origin: Secondary | ICD-10-CM | POA: Diagnosis not present

## 2019-08-13 DIAGNOSIS — Z79899 Other long term (current) drug therapy: Secondary | ICD-10-CM | POA: Diagnosis not present

## 2019-08-13 DIAGNOSIS — D631 Anemia in chronic kidney disease: Secondary | ICD-10-CM | POA: Diagnosis not present

## 2019-08-13 DIAGNOSIS — Z992 Dependence on renal dialysis: Secondary | ICD-10-CM | POA: Diagnosis not present

## 2019-08-13 DIAGNOSIS — D509 Iron deficiency anemia, unspecified: Secondary | ICD-10-CM | POA: Diagnosis not present

## 2019-08-13 DIAGNOSIS — E7841 Elevated Lipoprotein(a): Secondary | ICD-10-CM | POA: Diagnosis not present

## 2019-08-13 DIAGNOSIS — N186 End stage renal disease: Secondary | ICD-10-CM | POA: Diagnosis not present

## 2019-08-14 DIAGNOSIS — D631 Anemia in chronic kidney disease: Secondary | ICD-10-CM | POA: Diagnosis not present

## 2019-08-14 DIAGNOSIS — D509 Iron deficiency anemia, unspecified: Secondary | ICD-10-CM | POA: Diagnosis not present

## 2019-08-14 DIAGNOSIS — Z992 Dependence on renal dialysis: Secondary | ICD-10-CM | POA: Diagnosis not present

## 2019-08-14 DIAGNOSIS — N2589 Other disorders resulting from impaired renal tubular function: Secondary | ICD-10-CM | POA: Diagnosis not present

## 2019-08-14 DIAGNOSIS — Z79899 Other long term (current) drug therapy: Secondary | ICD-10-CM | POA: Diagnosis not present

## 2019-08-14 DIAGNOSIS — E7841 Elevated Lipoprotein(a): Secondary | ICD-10-CM | POA: Diagnosis not present

## 2019-08-14 DIAGNOSIS — N2581 Secondary hyperparathyroidism of renal origin: Secondary | ICD-10-CM | POA: Diagnosis not present

## 2019-08-14 DIAGNOSIS — E440 Moderate protein-calorie malnutrition: Secondary | ICD-10-CM | POA: Diagnosis not present

## 2019-08-14 DIAGNOSIS — N186 End stage renal disease: Secondary | ICD-10-CM | POA: Diagnosis not present

## 2019-08-15 DIAGNOSIS — N2589 Other disorders resulting from impaired renal tubular function: Secondary | ICD-10-CM | POA: Diagnosis not present

## 2019-08-15 DIAGNOSIS — D509 Iron deficiency anemia, unspecified: Secondary | ICD-10-CM | POA: Diagnosis not present

## 2019-08-15 DIAGNOSIS — Z79899 Other long term (current) drug therapy: Secondary | ICD-10-CM | POA: Diagnosis not present

## 2019-08-15 DIAGNOSIS — N186 End stage renal disease: Secondary | ICD-10-CM | POA: Diagnosis not present

## 2019-08-15 DIAGNOSIS — Z992 Dependence on renal dialysis: Secondary | ICD-10-CM | POA: Diagnosis not present

## 2019-08-15 DIAGNOSIS — D631 Anemia in chronic kidney disease: Secondary | ICD-10-CM | POA: Diagnosis not present

## 2019-08-15 DIAGNOSIS — N2581 Secondary hyperparathyroidism of renal origin: Secondary | ICD-10-CM | POA: Diagnosis not present

## 2019-08-15 DIAGNOSIS — E440 Moderate protein-calorie malnutrition: Secondary | ICD-10-CM | POA: Diagnosis not present

## 2019-08-15 DIAGNOSIS — E7841 Elevated Lipoprotein(a): Secondary | ICD-10-CM | POA: Diagnosis not present

## 2019-08-16 DIAGNOSIS — E7841 Elevated Lipoprotein(a): Secondary | ICD-10-CM | POA: Diagnosis not present

## 2019-08-16 DIAGNOSIS — D631 Anemia in chronic kidney disease: Secondary | ICD-10-CM | POA: Diagnosis not present

## 2019-08-16 DIAGNOSIS — D509 Iron deficiency anemia, unspecified: Secondary | ICD-10-CM | POA: Diagnosis not present

## 2019-08-16 DIAGNOSIS — E440 Moderate protein-calorie malnutrition: Secondary | ICD-10-CM | POA: Diagnosis not present

## 2019-08-16 DIAGNOSIS — N186 End stage renal disease: Secondary | ICD-10-CM | POA: Diagnosis not present

## 2019-08-16 DIAGNOSIS — N2589 Other disorders resulting from impaired renal tubular function: Secondary | ICD-10-CM | POA: Diagnosis not present

## 2019-08-16 DIAGNOSIS — N2581 Secondary hyperparathyroidism of renal origin: Secondary | ICD-10-CM | POA: Diagnosis not present

## 2019-08-16 DIAGNOSIS — Z79899 Other long term (current) drug therapy: Secondary | ICD-10-CM | POA: Diagnosis not present

## 2019-08-16 DIAGNOSIS — Z992 Dependence on renal dialysis: Secondary | ICD-10-CM | POA: Diagnosis not present

## 2019-08-17 DIAGNOSIS — D509 Iron deficiency anemia, unspecified: Secondary | ICD-10-CM | POA: Diagnosis not present

## 2019-08-17 DIAGNOSIS — N2581 Secondary hyperparathyroidism of renal origin: Secondary | ICD-10-CM | POA: Diagnosis not present

## 2019-08-17 DIAGNOSIS — Z79899 Other long term (current) drug therapy: Secondary | ICD-10-CM | POA: Diagnosis not present

## 2019-08-17 DIAGNOSIS — D631 Anemia in chronic kidney disease: Secondary | ICD-10-CM | POA: Diagnosis not present

## 2019-08-17 DIAGNOSIS — E440 Moderate protein-calorie malnutrition: Secondary | ICD-10-CM | POA: Diagnosis not present

## 2019-08-17 DIAGNOSIS — Z992 Dependence on renal dialysis: Secondary | ICD-10-CM | POA: Diagnosis not present

## 2019-08-17 DIAGNOSIS — E7841 Elevated Lipoprotein(a): Secondary | ICD-10-CM | POA: Diagnosis not present

## 2019-08-17 DIAGNOSIS — N2589 Other disorders resulting from impaired renal tubular function: Secondary | ICD-10-CM | POA: Diagnosis not present

## 2019-08-17 DIAGNOSIS — N186 End stage renal disease: Secondary | ICD-10-CM | POA: Diagnosis not present

## 2019-08-18 DIAGNOSIS — N186 End stage renal disease: Secondary | ICD-10-CM | POA: Diagnosis not present

## 2019-08-18 DIAGNOSIS — D631 Anemia in chronic kidney disease: Secondary | ICD-10-CM | POA: Diagnosis not present

## 2019-08-18 DIAGNOSIS — E440 Moderate protein-calorie malnutrition: Secondary | ICD-10-CM | POA: Diagnosis not present

## 2019-08-18 DIAGNOSIS — N2589 Other disorders resulting from impaired renal tubular function: Secondary | ICD-10-CM | POA: Diagnosis not present

## 2019-08-18 DIAGNOSIS — E7841 Elevated Lipoprotein(a): Secondary | ICD-10-CM | POA: Diagnosis not present

## 2019-08-18 DIAGNOSIS — Z992 Dependence on renal dialysis: Secondary | ICD-10-CM | POA: Diagnosis not present

## 2019-08-18 DIAGNOSIS — D509 Iron deficiency anemia, unspecified: Secondary | ICD-10-CM | POA: Diagnosis not present

## 2019-08-18 DIAGNOSIS — Z79899 Other long term (current) drug therapy: Secondary | ICD-10-CM | POA: Diagnosis not present

## 2019-08-18 DIAGNOSIS — N2581 Secondary hyperparathyroidism of renal origin: Secondary | ICD-10-CM | POA: Diagnosis not present

## 2019-08-19 DIAGNOSIS — D509 Iron deficiency anemia, unspecified: Secondary | ICD-10-CM | POA: Diagnosis not present

## 2019-08-19 DIAGNOSIS — N2581 Secondary hyperparathyroidism of renal origin: Secondary | ICD-10-CM | POA: Diagnosis not present

## 2019-08-19 DIAGNOSIS — N2589 Other disorders resulting from impaired renal tubular function: Secondary | ICD-10-CM | POA: Diagnosis not present

## 2019-08-19 DIAGNOSIS — N186 End stage renal disease: Secondary | ICD-10-CM | POA: Diagnosis not present

## 2019-08-19 DIAGNOSIS — Z992 Dependence on renal dialysis: Secondary | ICD-10-CM | POA: Diagnosis not present

## 2019-08-19 DIAGNOSIS — E440 Moderate protein-calorie malnutrition: Secondary | ICD-10-CM | POA: Diagnosis not present

## 2019-08-19 DIAGNOSIS — E7841 Elevated Lipoprotein(a): Secondary | ICD-10-CM | POA: Diagnosis not present

## 2019-08-19 DIAGNOSIS — D631 Anemia in chronic kidney disease: Secondary | ICD-10-CM | POA: Diagnosis not present

## 2019-08-19 DIAGNOSIS — Z79899 Other long term (current) drug therapy: Secondary | ICD-10-CM | POA: Diagnosis not present

## 2019-08-20 DIAGNOSIS — E440 Moderate protein-calorie malnutrition: Secondary | ICD-10-CM | POA: Diagnosis not present

## 2019-08-20 DIAGNOSIS — Z992 Dependence on renal dialysis: Secondary | ICD-10-CM | POA: Diagnosis not present

## 2019-08-20 DIAGNOSIS — D509 Iron deficiency anemia, unspecified: Secondary | ICD-10-CM | POA: Diagnosis not present

## 2019-08-20 DIAGNOSIS — E7841 Elevated Lipoprotein(a): Secondary | ICD-10-CM | POA: Diagnosis not present

## 2019-08-20 DIAGNOSIS — D631 Anemia in chronic kidney disease: Secondary | ICD-10-CM | POA: Diagnosis not present

## 2019-08-20 DIAGNOSIS — N2589 Other disorders resulting from impaired renal tubular function: Secondary | ICD-10-CM | POA: Diagnosis not present

## 2019-08-20 DIAGNOSIS — Z79899 Other long term (current) drug therapy: Secondary | ICD-10-CM | POA: Diagnosis not present

## 2019-08-20 DIAGNOSIS — N2581 Secondary hyperparathyroidism of renal origin: Secondary | ICD-10-CM | POA: Diagnosis not present

## 2019-08-20 DIAGNOSIS — N186 End stage renal disease: Secondary | ICD-10-CM | POA: Diagnosis not present

## 2019-08-21 DIAGNOSIS — E440 Moderate protein-calorie malnutrition: Secondary | ICD-10-CM | POA: Diagnosis not present

## 2019-08-21 DIAGNOSIS — Z79899 Other long term (current) drug therapy: Secondary | ICD-10-CM | POA: Diagnosis not present

## 2019-08-21 DIAGNOSIS — Z992 Dependence on renal dialysis: Secondary | ICD-10-CM | POA: Diagnosis not present

## 2019-08-21 DIAGNOSIS — N2589 Other disorders resulting from impaired renal tubular function: Secondary | ICD-10-CM | POA: Diagnosis not present

## 2019-08-21 DIAGNOSIS — E7841 Elevated Lipoprotein(a): Secondary | ICD-10-CM | POA: Diagnosis not present

## 2019-08-21 DIAGNOSIS — N2581 Secondary hyperparathyroidism of renal origin: Secondary | ICD-10-CM | POA: Diagnosis not present

## 2019-08-21 DIAGNOSIS — D509 Iron deficiency anemia, unspecified: Secondary | ICD-10-CM | POA: Diagnosis not present

## 2019-08-21 DIAGNOSIS — N186 End stage renal disease: Secondary | ICD-10-CM | POA: Diagnosis not present

## 2019-08-21 DIAGNOSIS — D631 Anemia in chronic kidney disease: Secondary | ICD-10-CM | POA: Diagnosis not present

## 2019-08-22 DIAGNOSIS — N2581 Secondary hyperparathyroidism of renal origin: Secondary | ICD-10-CM | POA: Diagnosis not present

## 2019-08-22 DIAGNOSIS — D631 Anemia in chronic kidney disease: Secondary | ICD-10-CM | POA: Diagnosis not present

## 2019-08-22 DIAGNOSIS — N2589 Other disorders resulting from impaired renal tubular function: Secondary | ICD-10-CM | POA: Diagnosis not present

## 2019-08-22 DIAGNOSIS — Z79899 Other long term (current) drug therapy: Secondary | ICD-10-CM | POA: Diagnosis not present

## 2019-08-22 DIAGNOSIS — E440 Moderate protein-calorie malnutrition: Secondary | ICD-10-CM | POA: Diagnosis not present

## 2019-08-22 DIAGNOSIS — E7841 Elevated Lipoprotein(a): Secondary | ICD-10-CM | POA: Diagnosis not present

## 2019-08-22 DIAGNOSIS — Z992 Dependence on renal dialysis: Secondary | ICD-10-CM | POA: Diagnosis not present

## 2019-08-22 DIAGNOSIS — N186 End stage renal disease: Secondary | ICD-10-CM | POA: Diagnosis not present

## 2019-08-22 DIAGNOSIS — D509 Iron deficiency anemia, unspecified: Secondary | ICD-10-CM | POA: Diagnosis not present

## 2019-08-23 DIAGNOSIS — N186 End stage renal disease: Secondary | ICD-10-CM | POA: Diagnosis not present

## 2019-08-23 DIAGNOSIS — Z992 Dependence on renal dialysis: Secondary | ICD-10-CM | POA: Diagnosis not present

## 2019-08-23 DIAGNOSIS — D509 Iron deficiency anemia, unspecified: Secondary | ICD-10-CM | POA: Diagnosis not present

## 2019-08-23 DIAGNOSIS — E440 Moderate protein-calorie malnutrition: Secondary | ICD-10-CM | POA: Diagnosis not present

## 2019-08-23 DIAGNOSIS — E7841 Elevated Lipoprotein(a): Secondary | ICD-10-CM | POA: Diagnosis not present

## 2019-08-23 DIAGNOSIS — N2589 Other disorders resulting from impaired renal tubular function: Secondary | ICD-10-CM | POA: Diagnosis not present

## 2019-08-23 DIAGNOSIS — D631 Anemia in chronic kidney disease: Secondary | ICD-10-CM | POA: Diagnosis not present

## 2019-08-23 DIAGNOSIS — N2581 Secondary hyperparathyroidism of renal origin: Secondary | ICD-10-CM | POA: Diagnosis not present

## 2019-08-23 DIAGNOSIS — Z79899 Other long term (current) drug therapy: Secondary | ICD-10-CM | POA: Diagnosis not present

## 2019-08-24 DIAGNOSIS — Z79899 Other long term (current) drug therapy: Secondary | ICD-10-CM | POA: Diagnosis not present

## 2019-08-24 DIAGNOSIS — E440 Moderate protein-calorie malnutrition: Secondary | ICD-10-CM | POA: Diagnosis not present

## 2019-08-24 DIAGNOSIS — D631 Anemia in chronic kidney disease: Secondary | ICD-10-CM | POA: Diagnosis not present

## 2019-08-24 DIAGNOSIS — N2581 Secondary hyperparathyroidism of renal origin: Secondary | ICD-10-CM | POA: Diagnosis not present

## 2019-08-24 DIAGNOSIS — N186 End stage renal disease: Secondary | ICD-10-CM | POA: Diagnosis not present

## 2019-08-24 DIAGNOSIS — N2589 Other disorders resulting from impaired renal tubular function: Secondary | ICD-10-CM | POA: Diagnosis not present

## 2019-08-24 DIAGNOSIS — E7841 Elevated Lipoprotein(a): Secondary | ICD-10-CM | POA: Diagnosis not present

## 2019-08-24 DIAGNOSIS — Z992 Dependence on renal dialysis: Secondary | ICD-10-CM | POA: Diagnosis not present

## 2019-08-24 DIAGNOSIS — D509 Iron deficiency anemia, unspecified: Secondary | ICD-10-CM | POA: Diagnosis not present

## 2019-08-25 DIAGNOSIS — E440 Moderate protein-calorie malnutrition: Secondary | ICD-10-CM | POA: Diagnosis not present

## 2019-08-25 DIAGNOSIS — E7841 Elevated Lipoprotein(a): Secondary | ICD-10-CM | POA: Diagnosis not present

## 2019-08-25 DIAGNOSIS — N186 End stage renal disease: Secondary | ICD-10-CM | POA: Diagnosis not present

## 2019-08-25 DIAGNOSIS — Z79899 Other long term (current) drug therapy: Secondary | ICD-10-CM | POA: Diagnosis not present

## 2019-08-25 DIAGNOSIS — D631 Anemia in chronic kidney disease: Secondary | ICD-10-CM | POA: Diagnosis not present

## 2019-08-25 DIAGNOSIS — N2581 Secondary hyperparathyroidism of renal origin: Secondary | ICD-10-CM | POA: Diagnosis not present

## 2019-08-25 DIAGNOSIS — Z992 Dependence on renal dialysis: Secondary | ICD-10-CM | POA: Diagnosis not present

## 2019-08-25 DIAGNOSIS — N2589 Other disorders resulting from impaired renal tubular function: Secondary | ICD-10-CM | POA: Diagnosis not present

## 2019-08-25 DIAGNOSIS — D509 Iron deficiency anemia, unspecified: Secondary | ICD-10-CM | POA: Diagnosis not present

## 2019-08-26 DIAGNOSIS — D509 Iron deficiency anemia, unspecified: Secondary | ICD-10-CM | POA: Diagnosis not present

## 2019-08-26 DIAGNOSIS — E7841 Elevated Lipoprotein(a): Secondary | ICD-10-CM | POA: Diagnosis not present

## 2019-08-26 DIAGNOSIS — Z79899 Other long term (current) drug therapy: Secondary | ICD-10-CM | POA: Diagnosis not present

## 2019-08-26 DIAGNOSIS — E440 Moderate protein-calorie malnutrition: Secondary | ICD-10-CM | POA: Diagnosis not present

## 2019-08-26 DIAGNOSIS — N186 End stage renal disease: Secondary | ICD-10-CM | POA: Diagnosis not present

## 2019-08-26 DIAGNOSIS — D631 Anemia in chronic kidney disease: Secondary | ICD-10-CM | POA: Diagnosis not present

## 2019-08-26 DIAGNOSIS — N2581 Secondary hyperparathyroidism of renal origin: Secondary | ICD-10-CM | POA: Diagnosis not present

## 2019-08-26 DIAGNOSIS — Z992 Dependence on renal dialysis: Secondary | ICD-10-CM | POA: Diagnosis not present

## 2019-08-26 DIAGNOSIS — N2589 Other disorders resulting from impaired renal tubular function: Secondary | ICD-10-CM | POA: Diagnosis not present

## 2019-08-27 DIAGNOSIS — E440 Moderate protein-calorie malnutrition: Secondary | ICD-10-CM | POA: Diagnosis not present

## 2019-08-27 DIAGNOSIS — Z992 Dependence on renal dialysis: Secondary | ICD-10-CM | POA: Diagnosis not present

## 2019-08-27 DIAGNOSIS — N2589 Other disorders resulting from impaired renal tubular function: Secondary | ICD-10-CM | POA: Diagnosis not present

## 2019-08-27 DIAGNOSIS — Z79899 Other long term (current) drug therapy: Secondary | ICD-10-CM | POA: Diagnosis not present

## 2019-08-27 DIAGNOSIS — D509 Iron deficiency anemia, unspecified: Secondary | ICD-10-CM | POA: Diagnosis not present

## 2019-08-27 DIAGNOSIS — D631 Anemia in chronic kidney disease: Secondary | ICD-10-CM | POA: Diagnosis not present

## 2019-08-27 DIAGNOSIS — E7841 Elevated Lipoprotein(a): Secondary | ICD-10-CM | POA: Diagnosis not present

## 2019-08-27 DIAGNOSIS — N2581 Secondary hyperparathyroidism of renal origin: Secondary | ICD-10-CM | POA: Diagnosis not present

## 2019-08-27 DIAGNOSIS — N186 End stage renal disease: Secondary | ICD-10-CM | POA: Diagnosis not present

## 2019-08-28 DIAGNOSIS — N186 End stage renal disease: Secondary | ICD-10-CM | POA: Diagnosis not present

## 2019-08-28 DIAGNOSIS — Z79899 Other long term (current) drug therapy: Secondary | ICD-10-CM | POA: Diagnosis not present

## 2019-08-28 DIAGNOSIS — D631 Anemia in chronic kidney disease: Secondary | ICD-10-CM | POA: Diagnosis not present

## 2019-08-28 DIAGNOSIS — N2581 Secondary hyperparathyroidism of renal origin: Secondary | ICD-10-CM | POA: Diagnosis not present

## 2019-08-28 DIAGNOSIS — E7841 Elevated Lipoprotein(a): Secondary | ICD-10-CM | POA: Diagnosis not present

## 2019-08-28 DIAGNOSIS — E440 Moderate protein-calorie malnutrition: Secondary | ICD-10-CM | POA: Diagnosis not present

## 2019-08-28 DIAGNOSIS — D509 Iron deficiency anemia, unspecified: Secondary | ICD-10-CM | POA: Diagnosis not present

## 2019-08-28 DIAGNOSIS — N2589 Other disorders resulting from impaired renal tubular function: Secondary | ICD-10-CM | POA: Diagnosis not present

## 2019-08-28 DIAGNOSIS — Z992 Dependence on renal dialysis: Secondary | ICD-10-CM | POA: Diagnosis not present

## 2019-08-29 DIAGNOSIS — N186 End stage renal disease: Secondary | ICD-10-CM | POA: Diagnosis not present

## 2019-08-29 DIAGNOSIS — N2581 Secondary hyperparathyroidism of renal origin: Secondary | ICD-10-CM | POA: Diagnosis not present

## 2019-08-29 DIAGNOSIS — Z79899 Other long term (current) drug therapy: Secondary | ICD-10-CM | POA: Diagnosis not present

## 2019-08-29 DIAGNOSIS — D631 Anemia in chronic kidney disease: Secondary | ICD-10-CM | POA: Diagnosis not present

## 2019-08-29 DIAGNOSIS — E440 Moderate protein-calorie malnutrition: Secondary | ICD-10-CM | POA: Diagnosis not present

## 2019-08-29 DIAGNOSIS — N2589 Other disorders resulting from impaired renal tubular function: Secondary | ICD-10-CM | POA: Diagnosis not present

## 2019-08-29 DIAGNOSIS — D509 Iron deficiency anemia, unspecified: Secondary | ICD-10-CM | POA: Diagnosis not present

## 2019-08-29 DIAGNOSIS — E7841 Elevated Lipoprotein(a): Secondary | ICD-10-CM | POA: Diagnosis not present

## 2019-08-29 DIAGNOSIS — Z992 Dependence on renal dialysis: Secondary | ICD-10-CM | POA: Diagnosis not present

## 2019-08-30 DIAGNOSIS — N2581 Secondary hyperparathyroidism of renal origin: Secondary | ICD-10-CM | POA: Diagnosis not present

## 2019-08-30 DIAGNOSIS — Z79899 Other long term (current) drug therapy: Secondary | ICD-10-CM | POA: Diagnosis not present

## 2019-08-30 DIAGNOSIS — D509 Iron deficiency anemia, unspecified: Secondary | ICD-10-CM | POA: Diagnosis not present

## 2019-08-30 DIAGNOSIS — E440 Moderate protein-calorie malnutrition: Secondary | ICD-10-CM | POA: Diagnosis not present

## 2019-08-30 DIAGNOSIS — E7841 Elevated Lipoprotein(a): Secondary | ICD-10-CM | POA: Diagnosis not present

## 2019-08-30 DIAGNOSIS — Z992 Dependence on renal dialysis: Secondary | ICD-10-CM | POA: Diagnosis not present

## 2019-08-30 DIAGNOSIS — N2589 Other disorders resulting from impaired renal tubular function: Secondary | ICD-10-CM | POA: Diagnosis not present

## 2019-08-30 DIAGNOSIS — D631 Anemia in chronic kidney disease: Secondary | ICD-10-CM | POA: Diagnosis not present

## 2019-08-30 DIAGNOSIS — N186 End stage renal disease: Secondary | ICD-10-CM | POA: Diagnosis not present

## 2019-08-31 DIAGNOSIS — N2581 Secondary hyperparathyroidism of renal origin: Secondary | ICD-10-CM | POA: Diagnosis not present

## 2019-08-31 DIAGNOSIS — N2589 Other disorders resulting from impaired renal tubular function: Secondary | ICD-10-CM | POA: Diagnosis not present

## 2019-08-31 DIAGNOSIS — D631 Anemia in chronic kidney disease: Secondary | ICD-10-CM | POA: Diagnosis not present

## 2019-08-31 DIAGNOSIS — Z992 Dependence on renal dialysis: Secondary | ICD-10-CM | POA: Diagnosis not present

## 2019-08-31 DIAGNOSIS — D509 Iron deficiency anemia, unspecified: Secondary | ICD-10-CM | POA: Diagnosis not present

## 2019-08-31 DIAGNOSIS — E7841 Elevated Lipoprotein(a): Secondary | ICD-10-CM | POA: Diagnosis not present

## 2019-08-31 DIAGNOSIS — E440 Moderate protein-calorie malnutrition: Secondary | ICD-10-CM | POA: Diagnosis not present

## 2019-08-31 DIAGNOSIS — Z79899 Other long term (current) drug therapy: Secondary | ICD-10-CM | POA: Diagnosis not present

## 2019-08-31 DIAGNOSIS — N186 End stage renal disease: Secondary | ICD-10-CM | POA: Diagnosis not present

## 2019-09-01 DIAGNOSIS — N186 End stage renal disease: Secondary | ICD-10-CM | POA: Diagnosis not present

## 2019-09-01 DIAGNOSIS — N2581 Secondary hyperparathyroidism of renal origin: Secondary | ICD-10-CM | POA: Diagnosis not present

## 2019-09-01 DIAGNOSIS — D509 Iron deficiency anemia, unspecified: Secondary | ICD-10-CM | POA: Diagnosis not present

## 2019-09-01 DIAGNOSIS — D631 Anemia in chronic kidney disease: Secondary | ICD-10-CM | POA: Diagnosis not present

## 2019-09-01 DIAGNOSIS — N2589 Other disorders resulting from impaired renal tubular function: Secondary | ICD-10-CM | POA: Diagnosis not present

## 2019-09-01 DIAGNOSIS — Z79899 Other long term (current) drug therapy: Secondary | ICD-10-CM | POA: Diagnosis not present

## 2019-09-01 DIAGNOSIS — E440 Moderate protein-calorie malnutrition: Secondary | ICD-10-CM | POA: Diagnosis not present

## 2019-09-01 DIAGNOSIS — E7841 Elevated Lipoprotein(a): Secondary | ICD-10-CM | POA: Diagnosis not present

## 2019-09-01 DIAGNOSIS — Z992 Dependence on renal dialysis: Secondary | ICD-10-CM | POA: Diagnosis not present

## 2019-09-02 DIAGNOSIS — Z79899 Other long term (current) drug therapy: Secondary | ICD-10-CM | POA: Diagnosis not present

## 2019-09-02 DIAGNOSIS — D631 Anemia in chronic kidney disease: Secondary | ICD-10-CM | POA: Diagnosis not present

## 2019-09-02 DIAGNOSIS — N186 End stage renal disease: Secondary | ICD-10-CM | POA: Diagnosis not present

## 2019-09-02 DIAGNOSIS — E7841 Elevated Lipoprotein(a): Secondary | ICD-10-CM | POA: Diagnosis not present

## 2019-09-02 DIAGNOSIS — D509 Iron deficiency anemia, unspecified: Secondary | ICD-10-CM | POA: Diagnosis not present

## 2019-09-02 DIAGNOSIS — N2581 Secondary hyperparathyroidism of renal origin: Secondary | ICD-10-CM | POA: Diagnosis not present

## 2019-09-02 DIAGNOSIS — E440 Moderate protein-calorie malnutrition: Secondary | ICD-10-CM | POA: Diagnosis not present

## 2019-09-02 DIAGNOSIS — N2589 Other disorders resulting from impaired renal tubular function: Secondary | ICD-10-CM | POA: Diagnosis not present

## 2019-09-02 DIAGNOSIS — Z992 Dependence on renal dialysis: Secondary | ICD-10-CM | POA: Diagnosis not present

## 2019-09-03 DIAGNOSIS — E7841 Elevated Lipoprotein(a): Secondary | ICD-10-CM | POA: Diagnosis not present

## 2019-09-03 DIAGNOSIS — Z992 Dependence on renal dialysis: Secondary | ICD-10-CM | POA: Diagnosis not present

## 2019-09-03 DIAGNOSIS — D509 Iron deficiency anemia, unspecified: Secondary | ICD-10-CM | POA: Diagnosis not present

## 2019-09-03 DIAGNOSIS — Z79899 Other long term (current) drug therapy: Secondary | ICD-10-CM | POA: Diagnosis not present

## 2019-09-03 DIAGNOSIS — N2589 Other disorders resulting from impaired renal tubular function: Secondary | ICD-10-CM | POA: Diagnosis not present

## 2019-09-03 DIAGNOSIS — D631 Anemia in chronic kidney disease: Secondary | ICD-10-CM | POA: Diagnosis not present

## 2019-09-03 DIAGNOSIS — N186 End stage renal disease: Secondary | ICD-10-CM | POA: Diagnosis not present

## 2019-09-03 DIAGNOSIS — E440 Moderate protein-calorie malnutrition: Secondary | ICD-10-CM | POA: Diagnosis not present

## 2019-09-03 DIAGNOSIS — N2581 Secondary hyperparathyroidism of renal origin: Secondary | ICD-10-CM | POA: Diagnosis not present

## 2019-09-04 DIAGNOSIS — D631 Anemia in chronic kidney disease: Secondary | ICD-10-CM | POA: Diagnosis not present

## 2019-09-04 DIAGNOSIS — E440 Moderate protein-calorie malnutrition: Secondary | ICD-10-CM | POA: Diagnosis not present

## 2019-09-04 DIAGNOSIS — N2581 Secondary hyperparathyroidism of renal origin: Secondary | ICD-10-CM | POA: Diagnosis not present

## 2019-09-04 DIAGNOSIS — D509 Iron deficiency anemia, unspecified: Secondary | ICD-10-CM | POA: Diagnosis not present

## 2019-09-04 DIAGNOSIS — Z79899 Other long term (current) drug therapy: Secondary | ICD-10-CM | POA: Diagnosis not present

## 2019-09-04 DIAGNOSIS — N2589 Other disorders resulting from impaired renal tubular function: Secondary | ICD-10-CM | POA: Diagnosis not present

## 2019-09-04 DIAGNOSIS — E7841 Elevated Lipoprotein(a): Secondary | ICD-10-CM | POA: Diagnosis not present

## 2019-09-04 DIAGNOSIS — N186 End stage renal disease: Secondary | ICD-10-CM | POA: Diagnosis not present

## 2019-09-04 DIAGNOSIS — Z992 Dependence on renal dialysis: Secondary | ICD-10-CM | POA: Diagnosis not present

## 2019-09-05 DIAGNOSIS — D631 Anemia in chronic kidney disease: Secondary | ICD-10-CM | POA: Diagnosis not present

## 2019-09-05 DIAGNOSIS — N2581 Secondary hyperparathyroidism of renal origin: Secondary | ICD-10-CM | POA: Diagnosis not present

## 2019-09-05 DIAGNOSIS — D509 Iron deficiency anemia, unspecified: Secondary | ICD-10-CM | POA: Diagnosis not present

## 2019-09-05 DIAGNOSIS — E7841 Elevated Lipoprotein(a): Secondary | ICD-10-CM | POA: Diagnosis not present

## 2019-09-05 DIAGNOSIS — E440 Moderate protein-calorie malnutrition: Secondary | ICD-10-CM | POA: Diagnosis not present

## 2019-09-05 DIAGNOSIS — N186 End stage renal disease: Secondary | ICD-10-CM | POA: Diagnosis not present

## 2019-09-05 DIAGNOSIS — Z992 Dependence on renal dialysis: Secondary | ICD-10-CM | POA: Diagnosis not present

## 2019-09-05 DIAGNOSIS — N2589 Other disorders resulting from impaired renal tubular function: Secondary | ICD-10-CM | POA: Diagnosis not present

## 2019-09-05 DIAGNOSIS — I129 Hypertensive chronic kidney disease with stage 1 through stage 4 chronic kidney disease, or unspecified chronic kidney disease: Secondary | ICD-10-CM | POA: Diagnosis not present

## 2019-09-05 DIAGNOSIS — Z79899 Other long term (current) drug therapy: Secondary | ICD-10-CM | POA: Diagnosis not present

## 2019-09-06 DIAGNOSIS — Z79899 Other long term (current) drug therapy: Secondary | ICD-10-CM | POA: Diagnosis not present

## 2019-09-06 DIAGNOSIS — E7841 Elevated Lipoprotein(a): Secondary | ICD-10-CM | POA: Diagnosis not present

## 2019-09-06 DIAGNOSIS — Z992 Dependence on renal dialysis: Secondary | ICD-10-CM | POA: Diagnosis not present

## 2019-09-06 DIAGNOSIS — N2589 Other disorders resulting from impaired renal tubular function: Secondary | ICD-10-CM | POA: Diagnosis not present

## 2019-09-06 DIAGNOSIS — E440 Moderate protein-calorie malnutrition: Secondary | ICD-10-CM | POA: Diagnosis not present

## 2019-09-06 DIAGNOSIS — N186 End stage renal disease: Secondary | ICD-10-CM | POA: Diagnosis not present

## 2019-09-06 DIAGNOSIS — N2581 Secondary hyperparathyroidism of renal origin: Secondary | ICD-10-CM | POA: Diagnosis not present

## 2019-09-06 DIAGNOSIS — D631 Anemia in chronic kidney disease: Secondary | ICD-10-CM | POA: Diagnosis not present

## 2019-09-06 DIAGNOSIS — D509 Iron deficiency anemia, unspecified: Secondary | ICD-10-CM | POA: Diagnosis not present

## 2019-09-07 DIAGNOSIS — N186 End stage renal disease: Secondary | ICD-10-CM | POA: Diagnosis not present

## 2019-09-07 DIAGNOSIS — N2589 Other disorders resulting from impaired renal tubular function: Secondary | ICD-10-CM | POA: Diagnosis not present

## 2019-09-07 DIAGNOSIS — E440 Moderate protein-calorie malnutrition: Secondary | ICD-10-CM | POA: Diagnosis not present

## 2019-09-07 DIAGNOSIS — N2581 Secondary hyperparathyroidism of renal origin: Secondary | ICD-10-CM | POA: Diagnosis not present

## 2019-09-07 DIAGNOSIS — Z992 Dependence on renal dialysis: Secondary | ICD-10-CM | POA: Diagnosis not present

## 2019-09-07 DIAGNOSIS — Z79899 Other long term (current) drug therapy: Secondary | ICD-10-CM | POA: Diagnosis not present

## 2019-09-07 DIAGNOSIS — E7841 Elevated Lipoprotein(a): Secondary | ICD-10-CM | POA: Diagnosis not present

## 2019-09-07 DIAGNOSIS — D509 Iron deficiency anemia, unspecified: Secondary | ICD-10-CM | POA: Diagnosis not present

## 2019-09-07 DIAGNOSIS — D631 Anemia in chronic kidney disease: Secondary | ICD-10-CM | POA: Diagnosis not present

## 2019-09-08 DIAGNOSIS — E7841 Elevated Lipoprotein(a): Secondary | ICD-10-CM | POA: Diagnosis not present

## 2019-09-08 DIAGNOSIS — D631 Anemia in chronic kidney disease: Secondary | ICD-10-CM | POA: Diagnosis not present

## 2019-09-08 DIAGNOSIS — N2589 Other disorders resulting from impaired renal tubular function: Secondary | ICD-10-CM | POA: Diagnosis not present

## 2019-09-08 DIAGNOSIS — N186 End stage renal disease: Secondary | ICD-10-CM | POA: Diagnosis not present

## 2019-09-08 DIAGNOSIS — N2581 Secondary hyperparathyroidism of renal origin: Secondary | ICD-10-CM | POA: Diagnosis not present

## 2019-09-08 DIAGNOSIS — E440 Moderate protein-calorie malnutrition: Secondary | ICD-10-CM | POA: Diagnosis not present

## 2019-09-08 DIAGNOSIS — D509 Iron deficiency anemia, unspecified: Secondary | ICD-10-CM | POA: Diagnosis not present

## 2019-09-08 DIAGNOSIS — Z992 Dependence on renal dialysis: Secondary | ICD-10-CM | POA: Diagnosis not present

## 2019-09-08 DIAGNOSIS — Z79899 Other long term (current) drug therapy: Secondary | ICD-10-CM | POA: Diagnosis not present

## 2019-09-09 DIAGNOSIS — E7841 Elevated Lipoprotein(a): Secondary | ICD-10-CM | POA: Diagnosis not present

## 2019-09-09 DIAGNOSIS — N2581 Secondary hyperparathyroidism of renal origin: Secondary | ICD-10-CM | POA: Diagnosis not present

## 2019-09-09 DIAGNOSIS — Z992 Dependence on renal dialysis: Secondary | ICD-10-CM | POA: Diagnosis not present

## 2019-09-09 DIAGNOSIS — N186 End stage renal disease: Secondary | ICD-10-CM | POA: Diagnosis not present

## 2019-09-09 DIAGNOSIS — Z79899 Other long term (current) drug therapy: Secondary | ICD-10-CM | POA: Diagnosis not present

## 2019-09-09 DIAGNOSIS — E440 Moderate protein-calorie malnutrition: Secondary | ICD-10-CM | POA: Diagnosis not present

## 2019-09-09 DIAGNOSIS — N2589 Other disorders resulting from impaired renal tubular function: Secondary | ICD-10-CM | POA: Diagnosis not present

## 2019-09-09 DIAGNOSIS — D509 Iron deficiency anemia, unspecified: Secondary | ICD-10-CM | POA: Diagnosis not present

## 2019-09-09 DIAGNOSIS — D631 Anemia in chronic kidney disease: Secondary | ICD-10-CM | POA: Diagnosis not present

## 2019-09-10 DIAGNOSIS — D509 Iron deficiency anemia, unspecified: Secondary | ICD-10-CM | POA: Diagnosis not present

## 2019-09-10 DIAGNOSIS — E440 Moderate protein-calorie malnutrition: Secondary | ICD-10-CM | POA: Diagnosis not present

## 2019-09-10 DIAGNOSIS — Z992 Dependence on renal dialysis: Secondary | ICD-10-CM | POA: Diagnosis not present

## 2019-09-10 DIAGNOSIS — Z79899 Other long term (current) drug therapy: Secondary | ICD-10-CM | POA: Diagnosis not present

## 2019-09-10 DIAGNOSIS — D631 Anemia in chronic kidney disease: Secondary | ICD-10-CM | POA: Diagnosis not present

## 2019-09-10 DIAGNOSIS — N186 End stage renal disease: Secondary | ICD-10-CM | POA: Diagnosis not present

## 2019-09-10 DIAGNOSIS — N2589 Other disorders resulting from impaired renal tubular function: Secondary | ICD-10-CM | POA: Diagnosis not present

## 2019-09-10 DIAGNOSIS — E7841 Elevated Lipoprotein(a): Secondary | ICD-10-CM | POA: Diagnosis not present

## 2019-09-10 DIAGNOSIS — N2581 Secondary hyperparathyroidism of renal origin: Secondary | ICD-10-CM | POA: Diagnosis not present

## 2019-09-11 DIAGNOSIS — N2581 Secondary hyperparathyroidism of renal origin: Secondary | ICD-10-CM | POA: Diagnosis not present

## 2019-09-11 DIAGNOSIS — Z992 Dependence on renal dialysis: Secondary | ICD-10-CM | POA: Diagnosis not present

## 2019-09-11 DIAGNOSIS — D631 Anemia in chronic kidney disease: Secondary | ICD-10-CM | POA: Diagnosis not present

## 2019-09-11 DIAGNOSIS — E440 Moderate protein-calorie malnutrition: Secondary | ICD-10-CM | POA: Diagnosis not present

## 2019-09-11 DIAGNOSIS — N186 End stage renal disease: Secondary | ICD-10-CM | POA: Diagnosis not present

## 2019-09-11 DIAGNOSIS — E7841 Elevated Lipoprotein(a): Secondary | ICD-10-CM | POA: Diagnosis not present

## 2019-09-11 DIAGNOSIS — N2589 Other disorders resulting from impaired renal tubular function: Secondary | ICD-10-CM | POA: Diagnosis not present

## 2019-09-11 DIAGNOSIS — D509 Iron deficiency anemia, unspecified: Secondary | ICD-10-CM | POA: Diagnosis not present

## 2019-09-11 DIAGNOSIS — Z79899 Other long term (current) drug therapy: Secondary | ICD-10-CM | POA: Diagnosis not present

## 2019-09-12 DIAGNOSIS — E440 Moderate protein-calorie malnutrition: Secondary | ICD-10-CM | POA: Diagnosis not present

## 2019-09-12 DIAGNOSIS — N2589 Other disorders resulting from impaired renal tubular function: Secondary | ICD-10-CM | POA: Diagnosis not present

## 2019-09-12 DIAGNOSIS — D631 Anemia in chronic kidney disease: Secondary | ICD-10-CM | POA: Diagnosis not present

## 2019-09-12 DIAGNOSIS — Z992 Dependence on renal dialysis: Secondary | ICD-10-CM | POA: Diagnosis not present

## 2019-09-12 DIAGNOSIS — N186 End stage renal disease: Secondary | ICD-10-CM | POA: Diagnosis not present

## 2019-09-12 DIAGNOSIS — Z79899 Other long term (current) drug therapy: Secondary | ICD-10-CM | POA: Diagnosis not present

## 2019-09-12 DIAGNOSIS — N2581 Secondary hyperparathyroidism of renal origin: Secondary | ICD-10-CM | POA: Diagnosis not present

## 2019-09-12 DIAGNOSIS — E7841 Elevated Lipoprotein(a): Secondary | ICD-10-CM | POA: Diagnosis not present

## 2019-09-12 DIAGNOSIS — D509 Iron deficiency anemia, unspecified: Secondary | ICD-10-CM | POA: Diagnosis not present

## 2019-09-13 DIAGNOSIS — N2589 Other disorders resulting from impaired renal tubular function: Secondary | ICD-10-CM | POA: Diagnosis not present

## 2019-09-13 DIAGNOSIS — N186 End stage renal disease: Secondary | ICD-10-CM | POA: Diagnosis not present

## 2019-09-13 DIAGNOSIS — D631 Anemia in chronic kidney disease: Secondary | ICD-10-CM | POA: Diagnosis not present

## 2019-09-13 DIAGNOSIS — E7841 Elevated Lipoprotein(a): Secondary | ICD-10-CM | POA: Diagnosis not present

## 2019-09-13 DIAGNOSIS — Z992 Dependence on renal dialysis: Secondary | ICD-10-CM | POA: Diagnosis not present

## 2019-09-13 DIAGNOSIS — E440 Moderate protein-calorie malnutrition: Secondary | ICD-10-CM | POA: Diagnosis not present

## 2019-09-13 DIAGNOSIS — Z79899 Other long term (current) drug therapy: Secondary | ICD-10-CM | POA: Diagnosis not present

## 2019-09-13 DIAGNOSIS — D509 Iron deficiency anemia, unspecified: Secondary | ICD-10-CM | POA: Diagnosis not present

## 2019-09-13 DIAGNOSIS — N2581 Secondary hyperparathyroidism of renal origin: Secondary | ICD-10-CM | POA: Diagnosis not present

## 2019-09-14 DIAGNOSIS — E440 Moderate protein-calorie malnutrition: Secondary | ICD-10-CM | POA: Diagnosis not present

## 2019-09-14 DIAGNOSIS — N186 End stage renal disease: Secondary | ICD-10-CM | POA: Diagnosis not present

## 2019-09-14 DIAGNOSIS — N2589 Other disorders resulting from impaired renal tubular function: Secondary | ICD-10-CM | POA: Diagnosis not present

## 2019-09-14 DIAGNOSIS — D631 Anemia in chronic kidney disease: Secondary | ICD-10-CM | POA: Diagnosis not present

## 2019-09-14 DIAGNOSIS — D509 Iron deficiency anemia, unspecified: Secondary | ICD-10-CM | POA: Diagnosis not present

## 2019-09-14 DIAGNOSIS — E7841 Elevated Lipoprotein(a): Secondary | ICD-10-CM | POA: Diagnosis not present

## 2019-09-14 DIAGNOSIS — N2581 Secondary hyperparathyroidism of renal origin: Secondary | ICD-10-CM | POA: Diagnosis not present

## 2019-09-14 DIAGNOSIS — Z79899 Other long term (current) drug therapy: Secondary | ICD-10-CM | POA: Diagnosis not present

## 2019-09-14 DIAGNOSIS — Z992 Dependence on renal dialysis: Secondary | ICD-10-CM | POA: Diagnosis not present

## 2019-09-15 DIAGNOSIS — N2589 Other disorders resulting from impaired renal tubular function: Secondary | ICD-10-CM | POA: Diagnosis not present

## 2019-09-15 DIAGNOSIS — D631 Anemia in chronic kidney disease: Secondary | ICD-10-CM | POA: Diagnosis not present

## 2019-09-15 DIAGNOSIS — E7841 Elevated Lipoprotein(a): Secondary | ICD-10-CM | POA: Diagnosis not present

## 2019-09-15 DIAGNOSIS — E440 Moderate protein-calorie malnutrition: Secondary | ICD-10-CM | POA: Diagnosis not present

## 2019-09-15 DIAGNOSIS — Z79899 Other long term (current) drug therapy: Secondary | ICD-10-CM | POA: Diagnosis not present

## 2019-09-15 DIAGNOSIS — N2581 Secondary hyperparathyroidism of renal origin: Secondary | ICD-10-CM | POA: Diagnosis not present

## 2019-09-15 DIAGNOSIS — N186 End stage renal disease: Secondary | ICD-10-CM | POA: Diagnosis not present

## 2019-09-15 DIAGNOSIS — D509 Iron deficiency anemia, unspecified: Secondary | ICD-10-CM | POA: Diagnosis not present

## 2019-09-15 DIAGNOSIS — Z992 Dependence on renal dialysis: Secondary | ICD-10-CM | POA: Diagnosis not present

## 2019-09-16 DIAGNOSIS — E7841 Elevated Lipoprotein(a): Secondary | ICD-10-CM | POA: Diagnosis not present

## 2019-09-16 DIAGNOSIS — Z992 Dependence on renal dialysis: Secondary | ICD-10-CM | POA: Diagnosis not present

## 2019-09-16 DIAGNOSIS — N186 End stage renal disease: Secondary | ICD-10-CM | POA: Diagnosis not present

## 2019-09-16 DIAGNOSIS — D509 Iron deficiency anemia, unspecified: Secondary | ICD-10-CM | POA: Diagnosis not present

## 2019-09-16 DIAGNOSIS — E440 Moderate protein-calorie malnutrition: Secondary | ICD-10-CM | POA: Diagnosis not present

## 2019-09-16 DIAGNOSIS — N2581 Secondary hyperparathyroidism of renal origin: Secondary | ICD-10-CM | POA: Diagnosis not present

## 2019-09-16 DIAGNOSIS — N2589 Other disorders resulting from impaired renal tubular function: Secondary | ICD-10-CM | POA: Diagnosis not present

## 2019-09-16 DIAGNOSIS — D631 Anemia in chronic kidney disease: Secondary | ICD-10-CM | POA: Diagnosis not present

## 2019-09-16 DIAGNOSIS — Z79899 Other long term (current) drug therapy: Secondary | ICD-10-CM | POA: Diagnosis not present

## 2019-09-17 DIAGNOSIS — E7841 Elevated Lipoprotein(a): Secondary | ICD-10-CM | POA: Diagnosis not present

## 2019-09-17 DIAGNOSIS — D631 Anemia in chronic kidney disease: Secondary | ICD-10-CM | POA: Diagnosis not present

## 2019-09-17 DIAGNOSIS — N186 End stage renal disease: Secondary | ICD-10-CM | POA: Diagnosis not present

## 2019-09-17 DIAGNOSIS — D509 Iron deficiency anemia, unspecified: Secondary | ICD-10-CM | POA: Diagnosis not present

## 2019-09-17 DIAGNOSIS — N2581 Secondary hyperparathyroidism of renal origin: Secondary | ICD-10-CM | POA: Diagnosis not present

## 2019-09-17 DIAGNOSIS — N2589 Other disorders resulting from impaired renal tubular function: Secondary | ICD-10-CM | POA: Diagnosis not present

## 2019-09-17 DIAGNOSIS — Z79899 Other long term (current) drug therapy: Secondary | ICD-10-CM | POA: Diagnosis not present

## 2019-09-17 DIAGNOSIS — E440 Moderate protein-calorie malnutrition: Secondary | ICD-10-CM | POA: Diagnosis not present

## 2019-09-17 DIAGNOSIS — Z992 Dependence on renal dialysis: Secondary | ICD-10-CM | POA: Diagnosis not present

## 2019-09-18 DIAGNOSIS — D509 Iron deficiency anemia, unspecified: Secondary | ICD-10-CM | POA: Diagnosis not present

## 2019-09-18 DIAGNOSIS — N2589 Other disorders resulting from impaired renal tubular function: Secondary | ICD-10-CM | POA: Diagnosis not present

## 2019-09-18 DIAGNOSIS — D631 Anemia in chronic kidney disease: Secondary | ICD-10-CM | POA: Diagnosis not present

## 2019-09-18 DIAGNOSIS — Z79899 Other long term (current) drug therapy: Secondary | ICD-10-CM | POA: Diagnosis not present

## 2019-09-18 DIAGNOSIS — Z992 Dependence on renal dialysis: Secondary | ICD-10-CM | POA: Diagnosis not present

## 2019-09-18 DIAGNOSIS — N2581 Secondary hyperparathyroidism of renal origin: Secondary | ICD-10-CM | POA: Diagnosis not present

## 2019-09-18 DIAGNOSIS — E7841 Elevated Lipoprotein(a): Secondary | ICD-10-CM | POA: Diagnosis not present

## 2019-09-18 DIAGNOSIS — N186 End stage renal disease: Secondary | ICD-10-CM | POA: Diagnosis not present

## 2019-09-18 DIAGNOSIS — E440 Moderate protein-calorie malnutrition: Secondary | ICD-10-CM | POA: Diagnosis not present

## 2019-09-19 DIAGNOSIS — Z992 Dependence on renal dialysis: Secondary | ICD-10-CM | POA: Diagnosis not present

## 2019-09-19 DIAGNOSIS — Z79899 Other long term (current) drug therapy: Secondary | ICD-10-CM | POA: Diagnosis not present

## 2019-09-19 DIAGNOSIS — N2581 Secondary hyperparathyroidism of renal origin: Secondary | ICD-10-CM | POA: Diagnosis not present

## 2019-09-19 DIAGNOSIS — N2589 Other disorders resulting from impaired renal tubular function: Secondary | ICD-10-CM | POA: Diagnosis not present

## 2019-09-19 DIAGNOSIS — D631 Anemia in chronic kidney disease: Secondary | ICD-10-CM | POA: Diagnosis not present

## 2019-09-19 DIAGNOSIS — N186 End stage renal disease: Secondary | ICD-10-CM | POA: Diagnosis not present

## 2019-09-19 DIAGNOSIS — E440 Moderate protein-calorie malnutrition: Secondary | ICD-10-CM | POA: Diagnosis not present

## 2019-09-19 DIAGNOSIS — D509 Iron deficiency anemia, unspecified: Secondary | ICD-10-CM | POA: Diagnosis not present

## 2019-09-19 DIAGNOSIS — E7841 Elevated Lipoprotein(a): Secondary | ICD-10-CM | POA: Diagnosis not present

## 2019-09-20 DIAGNOSIS — N2589 Other disorders resulting from impaired renal tubular function: Secondary | ICD-10-CM | POA: Diagnosis not present

## 2019-09-20 DIAGNOSIS — E7841 Elevated Lipoprotein(a): Secondary | ICD-10-CM | POA: Diagnosis not present

## 2019-09-20 DIAGNOSIS — D631 Anemia in chronic kidney disease: Secondary | ICD-10-CM | POA: Diagnosis not present

## 2019-09-20 DIAGNOSIS — D509 Iron deficiency anemia, unspecified: Secondary | ICD-10-CM | POA: Diagnosis not present

## 2019-09-20 DIAGNOSIS — N2581 Secondary hyperparathyroidism of renal origin: Secondary | ICD-10-CM | POA: Diagnosis not present

## 2019-09-20 DIAGNOSIS — Z79899 Other long term (current) drug therapy: Secondary | ICD-10-CM | POA: Diagnosis not present

## 2019-09-20 DIAGNOSIS — N186 End stage renal disease: Secondary | ICD-10-CM | POA: Diagnosis not present

## 2019-09-20 DIAGNOSIS — E440 Moderate protein-calorie malnutrition: Secondary | ICD-10-CM | POA: Diagnosis not present

## 2019-09-20 DIAGNOSIS — Z992 Dependence on renal dialysis: Secondary | ICD-10-CM | POA: Diagnosis not present

## 2019-09-21 DIAGNOSIS — E440 Moderate protein-calorie malnutrition: Secondary | ICD-10-CM | POA: Diagnosis not present

## 2019-09-21 DIAGNOSIS — N186 End stage renal disease: Secondary | ICD-10-CM | POA: Diagnosis not present

## 2019-09-21 DIAGNOSIS — D509 Iron deficiency anemia, unspecified: Secondary | ICD-10-CM | POA: Diagnosis not present

## 2019-09-21 DIAGNOSIS — N2581 Secondary hyperparathyroidism of renal origin: Secondary | ICD-10-CM | POA: Diagnosis not present

## 2019-09-21 DIAGNOSIS — Z992 Dependence on renal dialysis: Secondary | ICD-10-CM | POA: Diagnosis not present

## 2019-09-21 DIAGNOSIS — N2589 Other disorders resulting from impaired renal tubular function: Secondary | ICD-10-CM | POA: Diagnosis not present

## 2019-09-21 DIAGNOSIS — D631 Anemia in chronic kidney disease: Secondary | ICD-10-CM | POA: Diagnosis not present

## 2019-09-21 DIAGNOSIS — Z79899 Other long term (current) drug therapy: Secondary | ICD-10-CM | POA: Diagnosis not present

## 2019-09-21 DIAGNOSIS — E7841 Elevated Lipoprotein(a): Secondary | ICD-10-CM | POA: Diagnosis not present

## 2019-09-22 DIAGNOSIS — Z992 Dependence on renal dialysis: Secondary | ICD-10-CM | POA: Diagnosis not present

## 2019-09-22 DIAGNOSIS — N186 End stage renal disease: Secondary | ICD-10-CM | POA: Diagnosis not present

## 2019-09-22 DIAGNOSIS — N2581 Secondary hyperparathyroidism of renal origin: Secondary | ICD-10-CM | POA: Diagnosis not present

## 2019-09-22 DIAGNOSIS — D509 Iron deficiency anemia, unspecified: Secondary | ICD-10-CM | POA: Diagnosis not present

## 2019-09-22 DIAGNOSIS — N2589 Other disorders resulting from impaired renal tubular function: Secondary | ICD-10-CM | POA: Diagnosis not present

## 2019-09-22 DIAGNOSIS — E7841 Elevated Lipoprotein(a): Secondary | ICD-10-CM | POA: Diagnosis not present

## 2019-09-22 DIAGNOSIS — E440 Moderate protein-calorie malnutrition: Secondary | ICD-10-CM | POA: Diagnosis not present

## 2019-09-22 DIAGNOSIS — D631 Anemia in chronic kidney disease: Secondary | ICD-10-CM | POA: Diagnosis not present

## 2019-09-22 DIAGNOSIS — Z79899 Other long term (current) drug therapy: Secondary | ICD-10-CM | POA: Diagnosis not present

## 2019-09-23 DIAGNOSIS — E440 Moderate protein-calorie malnutrition: Secondary | ICD-10-CM | POA: Diagnosis not present

## 2019-09-23 DIAGNOSIS — D509 Iron deficiency anemia, unspecified: Secondary | ICD-10-CM | POA: Diagnosis not present

## 2019-09-23 DIAGNOSIS — Z992 Dependence on renal dialysis: Secondary | ICD-10-CM | POA: Diagnosis not present

## 2019-09-23 DIAGNOSIS — N186 End stage renal disease: Secondary | ICD-10-CM | POA: Diagnosis not present

## 2019-09-23 DIAGNOSIS — N2589 Other disorders resulting from impaired renal tubular function: Secondary | ICD-10-CM | POA: Diagnosis not present

## 2019-09-23 DIAGNOSIS — Z79899 Other long term (current) drug therapy: Secondary | ICD-10-CM | POA: Diagnosis not present

## 2019-09-23 DIAGNOSIS — N2581 Secondary hyperparathyroidism of renal origin: Secondary | ICD-10-CM | POA: Diagnosis not present

## 2019-09-23 DIAGNOSIS — D631 Anemia in chronic kidney disease: Secondary | ICD-10-CM | POA: Diagnosis not present

## 2019-09-23 DIAGNOSIS — E7841 Elevated Lipoprotein(a): Secondary | ICD-10-CM | POA: Diagnosis not present

## 2019-09-24 DIAGNOSIS — N186 End stage renal disease: Secondary | ICD-10-CM | POA: Diagnosis not present

## 2019-09-24 DIAGNOSIS — Z79899 Other long term (current) drug therapy: Secondary | ICD-10-CM | POA: Diagnosis not present

## 2019-09-24 DIAGNOSIS — N2581 Secondary hyperparathyroidism of renal origin: Secondary | ICD-10-CM | POA: Diagnosis not present

## 2019-09-24 DIAGNOSIS — E440 Moderate protein-calorie malnutrition: Secondary | ICD-10-CM | POA: Diagnosis not present

## 2019-09-24 DIAGNOSIS — Z992 Dependence on renal dialysis: Secondary | ICD-10-CM | POA: Diagnosis not present

## 2019-09-24 DIAGNOSIS — D631 Anemia in chronic kidney disease: Secondary | ICD-10-CM | POA: Diagnosis not present

## 2019-09-24 DIAGNOSIS — D509 Iron deficiency anemia, unspecified: Secondary | ICD-10-CM | POA: Diagnosis not present

## 2019-09-24 DIAGNOSIS — N2589 Other disorders resulting from impaired renal tubular function: Secondary | ICD-10-CM | POA: Diagnosis not present

## 2019-09-24 DIAGNOSIS — E7841 Elevated Lipoprotein(a): Secondary | ICD-10-CM | POA: Diagnosis not present

## 2019-09-25 DIAGNOSIS — E440 Moderate protein-calorie malnutrition: Secondary | ICD-10-CM | POA: Diagnosis not present

## 2019-09-25 DIAGNOSIS — D631 Anemia in chronic kidney disease: Secondary | ICD-10-CM | POA: Diagnosis not present

## 2019-09-25 DIAGNOSIS — N2581 Secondary hyperparathyroidism of renal origin: Secondary | ICD-10-CM | POA: Diagnosis not present

## 2019-09-25 DIAGNOSIS — Z79899 Other long term (current) drug therapy: Secondary | ICD-10-CM | POA: Diagnosis not present

## 2019-09-25 DIAGNOSIS — N2589 Other disorders resulting from impaired renal tubular function: Secondary | ICD-10-CM | POA: Diagnosis not present

## 2019-09-25 DIAGNOSIS — N186 End stage renal disease: Secondary | ICD-10-CM | POA: Diagnosis not present

## 2019-09-25 DIAGNOSIS — E7841 Elevated Lipoprotein(a): Secondary | ICD-10-CM | POA: Diagnosis not present

## 2019-09-25 DIAGNOSIS — D509 Iron deficiency anemia, unspecified: Secondary | ICD-10-CM | POA: Diagnosis not present

## 2019-09-25 DIAGNOSIS — Z992 Dependence on renal dialysis: Secondary | ICD-10-CM | POA: Diagnosis not present

## 2019-09-26 DIAGNOSIS — E440 Moderate protein-calorie malnutrition: Secondary | ICD-10-CM | POA: Diagnosis not present

## 2019-09-26 DIAGNOSIS — Z79899 Other long term (current) drug therapy: Secondary | ICD-10-CM | POA: Diagnosis not present

## 2019-09-26 DIAGNOSIS — N2581 Secondary hyperparathyroidism of renal origin: Secondary | ICD-10-CM | POA: Diagnosis not present

## 2019-09-26 DIAGNOSIS — E7841 Elevated Lipoprotein(a): Secondary | ICD-10-CM | POA: Diagnosis not present

## 2019-09-26 DIAGNOSIS — D509 Iron deficiency anemia, unspecified: Secondary | ICD-10-CM | POA: Diagnosis not present

## 2019-09-26 DIAGNOSIS — D631 Anemia in chronic kidney disease: Secondary | ICD-10-CM | POA: Diagnosis not present

## 2019-09-26 DIAGNOSIS — N186 End stage renal disease: Secondary | ICD-10-CM | POA: Diagnosis not present

## 2019-09-26 DIAGNOSIS — Z992 Dependence on renal dialysis: Secondary | ICD-10-CM | POA: Diagnosis not present

## 2019-09-26 DIAGNOSIS — N2589 Other disorders resulting from impaired renal tubular function: Secondary | ICD-10-CM | POA: Diagnosis not present

## 2019-09-27 DIAGNOSIS — Z79899 Other long term (current) drug therapy: Secondary | ICD-10-CM | POA: Diagnosis not present

## 2019-09-27 DIAGNOSIS — N2581 Secondary hyperparathyroidism of renal origin: Secondary | ICD-10-CM | POA: Diagnosis not present

## 2019-09-27 DIAGNOSIS — D631 Anemia in chronic kidney disease: Secondary | ICD-10-CM | POA: Diagnosis not present

## 2019-09-27 DIAGNOSIS — Z992 Dependence on renal dialysis: Secondary | ICD-10-CM | POA: Diagnosis not present

## 2019-09-27 DIAGNOSIS — D509 Iron deficiency anemia, unspecified: Secondary | ICD-10-CM | POA: Diagnosis not present

## 2019-09-27 DIAGNOSIS — N186 End stage renal disease: Secondary | ICD-10-CM | POA: Diagnosis not present

## 2019-09-27 DIAGNOSIS — N2589 Other disorders resulting from impaired renal tubular function: Secondary | ICD-10-CM | POA: Diagnosis not present

## 2019-09-27 DIAGNOSIS — E7841 Elevated Lipoprotein(a): Secondary | ICD-10-CM | POA: Diagnosis not present

## 2019-09-27 DIAGNOSIS — E440 Moderate protein-calorie malnutrition: Secondary | ICD-10-CM | POA: Diagnosis not present

## 2019-09-28 DIAGNOSIS — Z79899 Other long term (current) drug therapy: Secondary | ICD-10-CM | POA: Diagnosis not present

## 2019-09-28 DIAGNOSIS — Z992 Dependence on renal dialysis: Secondary | ICD-10-CM | POA: Diagnosis not present

## 2019-09-28 DIAGNOSIS — D631 Anemia in chronic kidney disease: Secondary | ICD-10-CM | POA: Diagnosis not present

## 2019-09-28 DIAGNOSIS — N186 End stage renal disease: Secondary | ICD-10-CM | POA: Diagnosis not present

## 2019-09-28 DIAGNOSIS — E7841 Elevated Lipoprotein(a): Secondary | ICD-10-CM | POA: Diagnosis not present

## 2019-09-28 DIAGNOSIS — E440 Moderate protein-calorie malnutrition: Secondary | ICD-10-CM | POA: Diagnosis not present

## 2019-09-28 DIAGNOSIS — Z7682 Awaiting organ transplant status: Secondary | ICD-10-CM | POA: Diagnosis not present

## 2019-09-28 DIAGNOSIS — D509 Iron deficiency anemia, unspecified: Secondary | ICD-10-CM | POA: Diagnosis not present

## 2019-09-28 DIAGNOSIS — N2581 Secondary hyperparathyroidism of renal origin: Secondary | ICD-10-CM | POA: Diagnosis not present

## 2019-09-28 DIAGNOSIS — N2589 Other disorders resulting from impaired renal tubular function: Secondary | ICD-10-CM | POA: Diagnosis not present

## 2019-09-29 DIAGNOSIS — D631 Anemia in chronic kidney disease: Secondary | ICD-10-CM | POA: Diagnosis not present

## 2019-09-29 DIAGNOSIS — E440 Moderate protein-calorie malnutrition: Secondary | ICD-10-CM | POA: Diagnosis not present

## 2019-09-29 DIAGNOSIS — D509 Iron deficiency anemia, unspecified: Secondary | ICD-10-CM | POA: Diagnosis not present

## 2019-09-29 DIAGNOSIS — N2581 Secondary hyperparathyroidism of renal origin: Secondary | ICD-10-CM | POA: Diagnosis not present

## 2019-09-29 DIAGNOSIS — N2589 Other disorders resulting from impaired renal tubular function: Secondary | ICD-10-CM | POA: Diagnosis not present

## 2019-09-29 DIAGNOSIS — N186 End stage renal disease: Secondary | ICD-10-CM | POA: Diagnosis not present

## 2019-09-29 DIAGNOSIS — Z992 Dependence on renal dialysis: Secondary | ICD-10-CM | POA: Diagnosis not present

## 2019-09-29 DIAGNOSIS — E7841 Elevated Lipoprotein(a): Secondary | ICD-10-CM | POA: Diagnosis not present

## 2019-09-29 DIAGNOSIS — Z79899 Other long term (current) drug therapy: Secondary | ICD-10-CM | POA: Diagnosis not present

## 2019-09-30 DIAGNOSIS — D509 Iron deficiency anemia, unspecified: Secondary | ICD-10-CM | POA: Diagnosis not present

## 2019-09-30 DIAGNOSIS — N2581 Secondary hyperparathyroidism of renal origin: Secondary | ICD-10-CM | POA: Diagnosis not present

## 2019-09-30 DIAGNOSIS — N186 End stage renal disease: Secondary | ICD-10-CM | POA: Diagnosis not present

## 2019-09-30 DIAGNOSIS — E440 Moderate protein-calorie malnutrition: Secondary | ICD-10-CM | POA: Diagnosis not present

## 2019-09-30 DIAGNOSIS — Z79899 Other long term (current) drug therapy: Secondary | ICD-10-CM | POA: Diagnosis not present

## 2019-09-30 DIAGNOSIS — Z992 Dependence on renal dialysis: Secondary | ICD-10-CM | POA: Diagnosis not present

## 2019-09-30 DIAGNOSIS — E7841 Elevated Lipoprotein(a): Secondary | ICD-10-CM | POA: Diagnosis not present

## 2019-09-30 DIAGNOSIS — D631 Anemia in chronic kidney disease: Secondary | ICD-10-CM | POA: Diagnosis not present

## 2019-09-30 DIAGNOSIS — N2589 Other disorders resulting from impaired renal tubular function: Secondary | ICD-10-CM | POA: Diagnosis not present

## 2019-10-01 DIAGNOSIS — N2589 Other disorders resulting from impaired renal tubular function: Secondary | ICD-10-CM | POA: Diagnosis not present

## 2019-10-01 DIAGNOSIS — Z992 Dependence on renal dialysis: Secondary | ICD-10-CM | POA: Diagnosis not present

## 2019-10-01 DIAGNOSIS — E7841 Elevated Lipoprotein(a): Secondary | ICD-10-CM | POA: Diagnosis not present

## 2019-10-01 DIAGNOSIS — D509 Iron deficiency anemia, unspecified: Secondary | ICD-10-CM | POA: Diagnosis not present

## 2019-10-01 DIAGNOSIS — D631 Anemia in chronic kidney disease: Secondary | ICD-10-CM | POA: Diagnosis not present

## 2019-10-01 DIAGNOSIS — N186 End stage renal disease: Secondary | ICD-10-CM | POA: Diagnosis not present

## 2019-10-01 DIAGNOSIS — Z79899 Other long term (current) drug therapy: Secondary | ICD-10-CM | POA: Diagnosis not present

## 2019-10-01 DIAGNOSIS — E440 Moderate protein-calorie malnutrition: Secondary | ICD-10-CM | POA: Diagnosis not present

## 2019-10-01 DIAGNOSIS — N2581 Secondary hyperparathyroidism of renal origin: Secondary | ICD-10-CM | POA: Diagnosis not present

## 2019-10-02 DIAGNOSIS — Z79899 Other long term (current) drug therapy: Secondary | ICD-10-CM | POA: Diagnosis not present

## 2019-10-02 DIAGNOSIS — D509 Iron deficiency anemia, unspecified: Secondary | ICD-10-CM | POA: Diagnosis not present

## 2019-10-02 DIAGNOSIS — E7841 Elevated Lipoprotein(a): Secondary | ICD-10-CM | POA: Diagnosis not present

## 2019-10-02 DIAGNOSIS — Z992 Dependence on renal dialysis: Secondary | ICD-10-CM | POA: Diagnosis not present

## 2019-10-02 DIAGNOSIS — N2589 Other disorders resulting from impaired renal tubular function: Secondary | ICD-10-CM | POA: Diagnosis not present

## 2019-10-02 DIAGNOSIS — D631 Anemia in chronic kidney disease: Secondary | ICD-10-CM | POA: Diagnosis not present

## 2019-10-02 DIAGNOSIS — E440 Moderate protein-calorie malnutrition: Secondary | ICD-10-CM | POA: Diagnosis not present

## 2019-10-02 DIAGNOSIS — N2581 Secondary hyperparathyroidism of renal origin: Secondary | ICD-10-CM | POA: Diagnosis not present

## 2019-10-02 DIAGNOSIS — N186 End stage renal disease: Secondary | ICD-10-CM | POA: Diagnosis not present

## 2019-10-03 DIAGNOSIS — D631 Anemia in chronic kidney disease: Secondary | ICD-10-CM | POA: Diagnosis not present

## 2019-10-03 DIAGNOSIS — E7841 Elevated Lipoprotein(a): Secondary | ICD-10-CM | POA: Diagnosis not present

## 2019-10-03 DIAGNOSIS — N186 End stage renal disease: Secondary | ICD-10-CM | POA: Diagnosis not present

## 2019-10-03 DIAGNOSIS — E440 Moderate protein-calorie malnutrition: Secondary | ICD-10-CM | POA: Diagnosis not present

## 2019-10-03 DIAGNOSIS — Z79899 Other long term (current) drug therapy: Secondary | ICD-10-CM | POA: Diagnosis not present

## 2019-10-03 DIAGNOSIS — D509 Iron deficiency anemia, unspecified: Secondary | ICD-10-CM | POA: Diagnosis not present

## 2019-10-03 DIAGNOSIS — N2581 Secondary hyperparathyroidism of renal origin: Secondary | ICD-10-CM | POA: Diagnosis not present

## 2019-10-03 DIAGNOSIS — Z992 Dependence on renal dialysis: Secondary | ICD-10-CM | POA: Diagnosis not present

## 2019-10-03 DIAGNOSIS — N2589 Other disorders resulting from impaired renal tubular function: Secondary | ICD-10-CM | POA: Diagnosis not present

## 2019-10-04 DIAGNOSIS — E440 Moderate protein-calorie malnutrition: Secondary | ICD-10-CM | POA: Diagnosis not present

## 2019-10-04 DIAGNOSIS — D631 Anemia in chronic kidney disease: Secondary | ICD-10-CM | POA: Diagnosis not present

## 2019-10-04 DIAGNOSIS — D509 Iron deficiency anemia, unspecified: Secondary | ICD-10-CM | POA: Diagnosis not present

## 2019-10-04 DIAGNOSIS — E7841 Elevated Lipoprotein(a): Secondary | ICD-10-CM | POA: Diagnosis not present

## 2019-10-04 DIAGNOSIS — N2581 Secondary hyperparathyroidism of renal origin: Secondary | ICD-10-CM | POA: Diagnosis not present

## 2019-10-04 DIAGNOSIS — Z992 Dependence on renal dialysis: Secondary | ICD-10-CM | POA: Diagnosis not present

## 2019-10-04 DIAGNOSIS — Z79899 Other long term (current) drug therapy: Secondary | ICD-10-CM | POA: Diagnosis not present

## 2019-10-04 DIAGNOSIS — N186 End stage renal disease: Secondary | ICD-10-CM | POA: Diagnosis not present

## 2019-10-04 DIAGNOSIS — N2589 Other disorders resulting from impaired renal tubular function: Secondary | ICD-10-CM | POA: Diagnosis not present

## 2019-10-05 DIAGNOSIS — E7841 Elevated Lipoprotein(a): Secondary | ICD-10-CM | POA: Diagnosis not present

## 2019-10-05 DIAGNOSIS — N186 End stage renal disease: Secondary | ICD-10-CM | POA: Diagnosis not present

## 2019-10-05 DIAGNOSIS — N2581 Secondary hyperparathyroidism of renal origin: Secondary | ICD-10-CM | POA: Diagnosis not present

## 2019-10-05 DIAGNOSIS — D631 Anemia in chronic kidney disease: Secondary | ICD-10-CM | POA: Diagnosis not present

## 2019-10-05 DIAGNOSIS — Z992 Dependence on renal dialysis: Secondary | ICD-10-CM | POA: Diagnosis not present

## 2019-10-05 DIAGNOSIS — E440 Moderate protein-calorie malnutrition: Secondary | ICD-10-CM | POA: Diagnosis not present

## 2019-10-05 DIAGNOSIS — N2589 Other disorders resulting from impaired renal tubular function: Secondary | ICD-10-CM | POA: Diagnosis not present

## 2019-10-05 DIAGNOSIS — I129 Hypertensive chronic kidney disease with stage 1 through stage 4 chronic kidney disease, or unspecified chronic kidney disease: Secondary | ICD-10-CM | POA: Diagnosis not present

## 2019-10-05 DIAGNOSIS — Z79899 Other long term (current) drug therapy: Secondary | ICD-10-CM | POA: Diagnosis not present

## 2019-10-05 DIAGNOSIS — D509 Iron deficiency anemia, unspecified: Secondary | ICD-10-CM | POA: Diagnosis not present

## 2019-10-06 DIAGNOSIS — D509 Iron deficiency anemia, unspecified: Secondary | ICD-10-CM | POA: Diagnosis not present

## 2019-10-06 DIAGNOSIS — N2581 Secondary hyperparathyroidism of renal origin: Secondary | ICD-10-CM | POA: Diagnosis not present

## 2019-10-06 DIAGNOSIS — N2589 Other disorders resulting from impaired renal tubular function: Secondary | ICD-10-CM | POA: Diagnosis not present

## 2019-10-06 DIAGNOSIS — Z79899 Other long term (current) drug therapy: Secondary | ICD-10-CM | POA: Diagnosis not present

## 2019-10-06 DIAGNOSIS — N186 End stage renal disease: Secondary | ICD-10-CM | POA: Diagnosis not present

## 2019-10-06 DIAGNOSIS — Z992 Dependence on renal dialysis: Secondary | ICD-10-CM | POA: Diagnosis not present

## 2019-10-06 DIAGNOSIS — D631 Anemia in chronic kidney disease: Secondary | ICD-10-CM | POA: Diagnosis not present

## 2019-10-06 DIAGNOSIS — E440 Moderate protein-calorie malnutrition: Secondary | ICD-10-CM | POA: Diagnosis not present

## 2019-10-06 DIAGNOSIS — Z4932 Encounter for adequacy testing for peritoneal dialysis: Secondary | ICD-10-CM | POA: Diagnosis not present

## 2019-10-07 DIAGNOSIS — Z79899 Other long term (current) drug therapy: Secondary | ICD-10-CM | POA: Diagnosis not present

## 2019-10-07 DIAGNOSIS — N186 End stage renal disease: Secondary | ICD-10-CM | POA: Diagnosis not present

## 2019-10-07 DIAGNOSIS — Z4932 Encounter for adequacy testing for peritoneal dialysis: Secondary | ICD-10-CM | POA: Diagnosis not present

## 2019-10-07 DIAGNOSIS — D509 Iron deficiency anemia, unspecified: Secondary | ICD-10-CM | POA: Diagnosis not present

## 2019-10-07 DIAGNOSIS — N2581 Secondary hyperparathyroidism of renal origin: Secondary | ICD-10-CM | POA: Diagnosis not present

## 2019-10-07 DIAGNOSIS — Z992 Dependence on renal dialysis: Secondary | ICD-10-CM | POA: Diagnosis not present

## 2019-10-07 DIAGNOSIS — E440 Moderate protein-calorie malnutrition: Secondary | ICD-10-CM | POA: Diagnosis not present

## 2019-10-07 DIAGNOSIS — D631 Anemia in chronic kidney disease: Secondary | ICD-10-CM | POA: Diagnosis not present

## 2019-10-07 DIAGNOSIS — N2589 Other disorders resulting from impaired renal tubular function: Secondary | ICD-10-CM | POA: Diagnosis not present

## 2019-10-08 DIAGNOSIS — E440 Moderate protein-calorie malnutrition: Secondary | ICD-10-CM | POA: Diagnosis not present

## 2019-10-08 DIAGNOSIS — N186 End stage renal disease: Secondary | ICD-10-CM | POA: Diagnosis not present

## 2019-10-08 DIAGNOSIS — N2581 Secondary hyperparathyroidism of renal origin: Secondary | ICD-10-CM | POA: Diagnosis not present

## 2019-10-08 DIAGNOSIS — N2589 Other disorders resulting from impaired renal tubular function: Secondary | ICD-10-CM | POA: Diagnosis not present

## 2019-10-08 DIAGNOSIS — D509 Iron deficiency anemia, unspecified: Secondary | ICD-10-CM | POA: Diagnosis not present

## 2019-10-08 DIAGNOSIS — Z992 Dependence on renal dialysis: Secondary | ICD-10-CM | POA: Diagnosis not present

## 2019-10-08 DIAGNOSIS — Z4932 Encounter for adequacy testing for peritoneal dialysis: Secondary | ICD-10-CM | POA: Diagnosis not present

## 2019-10-08 DIAGNOSIS — Z79899 Other long term (current) drug therapy: Secondary | ICD-10-CM | POA: Diagnosis not present

## 2019-10-08 DIAGNOSIS — D631 Anemia in chronic kidney disease: Secondary | ICD-10-CM | POA: Diagnosis not present

## 2019-10-09 DIAGNOSIS — N2581 Secondary hyperparathyroidism of renal origin: Secondary | ICD-10-CM | POA: Diagnosis not present

## 2019-10-09 DIAGNOSIS — N2589 Other disorders resulting from impaired renal tubular function: Secondary | ICD-10-CM | POA: Diagnosis not present

## 2019-10-09 DIAGNOSIS — Z79899 Other long term (current) drug therapy: Secondary | ICD-10-CM | POA: Diagnosis not present

## 2019-10-09 DIAGNOSIS — D509 Iron deficiency anemia, unspecified: Secondary | ICD-10-CM | POA: Diagnosis not present

## 2019-10-09 DIAGNOSIS — N186 End stage renal disease: Secondary | ICD-10-CM | POA: Diagnosis not present

## 2019-10-09 DIAGNOSIS — E440 Moderate protein-calorie malnutrition: Secondary | ICD-10-CM | POA: Diagnosis not present

## 2019-10-09 DIAGNOSIS — D631 Anemia in chronic kidney disease: Secondary | ICD-10-CM | POA: Diagnosis not present

## 2019-10-09 DIAGNOSIS — Z4932 Encounter for adequacy testing for peritoneal dialysis: Secondary | ICD-10-CM | POA: Diagnosis not present

## 2019-10-09 DIAGNOSIS — Z992 Dependence on renal dialysis: Secondary | ICD-10-CM | POA: Diagnosis not present

## 2019-10-10 DIAGNOSIS — Z992 Dependence on renal dialysis: Secondary | ICD-10-CM | POA: Diagnosis not present

## 2019-10-10 DIAGNOSIS — N2589 Other disorders resulting from impaired renal tubular function: Secondary | ICD-10-CM | POA: Diagnosis not present

## 2019-10-10 DIAGNOSIS — N2581 Secondary hyperparathyroidism of renal origin: Secondary | ICD-10-CM | POA: Diagnosis not present

## 2019-10-10 DIAGNOSIS — D631 Anemia in chronic kidney disease: Secondary | ICD-10-CM | POA: Diagnosis not present

## 2019-10-10 DIAGNOSIS — N186 End stage renal disease: Secondary | ICD-10-CM | POA: Diagnosis not present

## 2019-10-10 DIAGNOSIS — Z4932 Encounter for adequacy testing for peritoneal dialysis: Secondary | ICD-10-CM | POA: Diagnosis not present

## 2019-10-10 DIAGNOSIS — E440 Moderate protein-calorie malnutrition: Secondary | ICD-10-CM | POA: Diagnosis not present

## 2019-10-10 DIAGNOSIS — D509 Iron deficiency anemia, unspecified: Secondary | ICD-10-CM | POA: Diagnosis not present

## 2019-10-10 DIAGNOSIS — Z79899 Other long term (current) drug therapy: Secondary | ICD-10-CM | POA: Diagnosis not present

## 2019-10-11 DIAGNOSIS — Z79899 Other long term (current) drug therapy: Secondary | ICD-10-CM | POA: Diagnosis not present

## 2019-10-11 DIAGNOSIS — N2581 Secondary hyperparathyroidism of renal origin: Secondary | ICD-10-CM | POA: Diagnosis not present

## 2019-10-11 DIAGNOSIS — D631 Anemia in chronic kidney disease: Secondary | ICD-10-CM | POA: Diagnosis not present

## 2019-10-11 DIAGNOSIS — N186 End stage renal disease: Secondary | ICD-10-CM | POA: Diagnosis not present

## 2019-10-11 DIAGNOSIS — Z4932 Encounter for adequacy testing for peritoneal dialysis: Secondary | ICD-10-CM | POA: Diagnosis not present

## 2019-10-11 DIAGNOSIS — E440 Moderate protein-calorie malnutrition: Secondary | ICD-10-CM | POA: Diagnosis not present

## 2019-10-11 DIAGNOSIS — D509 Iron deficiency anemia, unspecified: Secondary | ICD-10-CM | POA: Diagnosis not present

## 2019-10-11 DIAGNOSIS — Z992 Dependence on renal dialysis: Secondary | ICD-10-CM | POA: Diagnosis not present

## 2019-10-11 DIAGNOSIS — N2589 Other disorders resulting from impaired renal tubular function: Secondary | ICD-10-CM | POA: Diagnosis not present

## 2019-10-12 DIAGNOSIS — E440 Moderate protein-calorie malnutrition: Secondary | ICD-10-CM | POA: Diagnosis not present

## 2019-10-12 DIAGNOSIS — Z79899 Other long term (current) drug therapy: Secondary | ICD-10-CM | POA: Diagnosis not present

## 2019-10-12 DIAGNOSIS — N2581 Secondary hyperparathyroidism of renal origin: Secondary | ICD-10-CM | POA: Diagnosis not present

## 2019-10-12 DIAGNOSIS — Z4932 Encounter for adequacy testing for peritoneal dialysis: Secondary | ICD-10-CM | POA: Diagnosis not present

## 2019-10-12 DIAGNOSIS — N186 End stage renal disease: Secondary | ICD-10-CM | POA: Diagnosis not present

## 2019-10-12 DIAGNOSIS — Z992 Dependence on renal dialysis: Secondary | ICD-10-CM | POA: Diagnosis not present

## 2019-10-12 DIAGNOSIS — D631 Anemia in chronic kidney disease: Secondary | ICD-10-CM | POA: Diagnosis not present

## 2019-10-12 DIAGNOSIS — D509 Iron deficiency anemia, unspecified: Secondary | ICD-10-CM | POA: Diagnosis not present

## 2019-10-12 DIAGNOSIS — N2589 Other disorders resulting from impaired renal tubular function: Secondary | ICD-10-CM | POA: Diagnosis not present

## 2019-10-13 DIAGNOSIS — D509 Iron deficiency anemia, unspecified: Secondary | ICD-10-CM | POA: Diagnosis not present

## 2019-10-13 DIAGNOSIS — Z4932 Encounter for adequacy testing for peritoneal dialysis: Secondary | ICD-10-CM | POA: Diagnosis not present

## 2019-10-13 DIAGNOSIS — N2589 Other disorders resulting from impaired renal tubular function: Secondary | ICD-10-CM | POA: Diagnosis not present

## 2019-10-13 DIAGNOSIS — N2581 Secondary hyperparathyroidism of renal origin: Secondary | ICD-10-CM | POA: Diagnosis not present

## 2019-10-13 DIAGNOSIS — Z992 Dependence on renal dialysis: Secondary | ICD-10-CM | POA: Diagnosis not present

## 2019-10-13 DIAGNOSIS — E440 Moderate protein-calorie malnutrition: Secondary | ICD-10-CM | POA: Diagnosis not present

## 2019-10-13 DIAGNOSIS — D631 Anemia in chronic kidney disease: Secondary | ICD-10-CM | POA: Diagnosis not present

## 2019-10-13 DIAGNOSIS — Z79899 Other long term (current) drug therapy: Secondary | ICD-10-CM | POA: Diagnosis not present

## 2019-10-13 DIAGNOSIS — N186 End stage renal disease: Secondary | ICD-10-CM | POA: Diagnosis not present

## 2019-10-14 DIAGNOSIS — D631 Anemia in chronic kidney disease: Secondary | ICD-10-CM | POA: Diagnosis not present

## 2019-10-14 DIAGNOSIS — N2589 Other disorders resulting from impaired renal tubular function: Secondary | ICD-10-CM | POA: Diagnosis not present

## 2019-10-14 DIAGNOSIS — N2581 Secondary hyperparathyroidism of renal origin: Secondary | ICD-10-CM | POA: Diagnosis not present

## 2019-10-14 DIAGNOSIS — E440 Moderate protein-calorie malnutrition: Secondary | ICD-10-CM | POA: Diagnosis not present

## 2019-10-14 DIAGNOSIS — Z79899 Other long term (current) drug therapy: Secondary | ICD-10-CM | POA: Diagnosis not present

## 2019-10-14 DIAGNOSIS — Z992 Dependence on renal dialysis: Secondary | ICD-10-CM | POA: Diagnosis not present

## 2019-10-14 DIAGNOSIS — Z4932 Encounter for adequacy testing for peritoneal dialysis: Secondary | ICD-10-CM | POA: Diagnosis not present

## 2019-10-14 DIAGNOSIS — D509 Iron deficiency anemia, unspecified: Secondary | ICD-10-CM | POA: Diagnosis not present

## 2019-10-14 DIAGNOSIS — N186 End stage renal disease: Secondary | ICD-10-CM | POA: Diagnosis not present

## 2019-10-15 DIAGNOSIS — N186 End stage renal disease: Secondary | ICD-10-CM | POA: Diagnosis not present

## 2019-10-15 DIAGNOSIS — D509 Iron deficiency anemia, unspecified: Secondary | ICD-10-CM | POA: Diagnosis not present

## 2019-10-15 DIAGNOSIS — Z992 Dependence on renal dialysis: Secondary | ICD-10-CM | POA: Diagnosis not present

## 2019-10-15 DIAGNOSIS — Z79899 Other long term (current) drug therapy: Secondary | ICD-10-CM | POA: Diagnosis not present

## 2019-10-15 DIAGNOSIS — N2589 Other disorders resulting from impaired renal tubular function: Secondary | ICD-10-CM | POA: Diagnosis not present

## 2019-10-15 DIAGNOSIS — N2581 Secondary hyperparathyroidism of renal origin: Secondary | ICD-10-CM | POA: Diagnosis not present

## 2019-10-15 DIAGNOSIS — Z4932 Encounter for adequacy testing for peritoneal dialysis: Secondary | ICD-10-CM | POA: Diagnosis not present

## 2019-10-15 DIAGNOSIS — E440 Moderate protein-calorie malnutrition: Secondary | ICD-10-CM | POA: Diagnosis not present

## 2019-10-15 DIAGNOSIS — D631 Anemia in chronic kidney disease: Secondary | ICD-10-CM | POA: Diagnosis not present

## 2019-10-16 DIAGNOSIS — Z4932 Encounter for adequacy testing for peritoneal dialysis: Secondary | ICD-10-CM | POA: Diagnosis not present

## 2019-10-16 DIAGNOSIS — Z992 Dependence on renal dialysis: Secondary | ICD-10-CM | POA: Diagnosis not present

## 2019-10-16 DIAGNOSIS — N186 End stage renal disease: Secondary | ICD-10-CM | POA: Diagnosis not present

## 2019-10-16 DIAGNOSIS — N2581 Secondary hyperparathyroidism of renal origin: Secondary | ICD-10-CM | POA: Diagnosis not present

## 2019-10-16 DIAGNOSIS — D509 Iron deficiency anemia, unspecified: Secondary | ICD-10-CM | POA: Diagnosis not present

## 2019-10-16 DIAGNOSIS — D631 Anemia in chronic kidney disease: Secondary | ICD-10-CM | POA: Diagnosis not present

## 2019-10-16 DIAGNOSIS — N2589 Other disorders resulting from impaired renal tubular function: Secondary | ICD-10-CM | POA: Diagnosis not present

## 2019-10-16 DIAGNOSIS — E440 Moderate protein-calorie malnutrition: Secondary | ICD-10-CM | POA: Diagnosis not present

## 2019-10-16 DIAGNOSIS — Z79899 Other long term (current) drug therapy: Secondary | ICD-10-CM | POA: Diagnosis not present

## 2019-10-17 DIAGNOSIS — Z992 Dependence on renal dialysis: Secondary | ICD-10-CM | POA: Diagnosis not present

## 2019-10-17 DIAGNOSIS — N186 End stage renal disease: Secondary | ICD-10-CM | POA: Diagnosis not present

## 2019-10-17 DIAGNOSIS — D631 Anemia in chronic kidney disease: Secondary | ICD-10-CM | POA: Diagnosis not present

## 2019-10-17 DIAGNOSIS — N2581 Secondary hyperparathyroidism of renal origin: Secondary | ICD-10-CM | POA: Diagnosis not present

## 2019-10-17 DIAGNOSIS — Z79899 Other long term (current) drug therapy: Secondary | ICD-10-CM | POA: Diagnosis not present

## 2019-10-17 DIAGNOSIS — E440 Moderate protein-calorie malnutrition: Secondary | ICD-10-CM | POA: Diagnosis not present

## 2019-10-17 DIAGNOSIS — D509 Iron deficiency anemia, unspecified: Secondary | ICD-10-CM | POA: Diagnosis not present

## 2019-10-17 DIAGNOSIS — N2589 Other disorders resulting from impaired renal tubular function: Secondary | ICD-10-CM | POA: Diagnosis not present

## 2019-10-17 DIAGNOSIS — Z4932 Encounter for adequacy testing for peritoneal dialysis: Secondary | ICD-10-CM | POA: Diagnosis not present

## 2019-10-18 DIAGNOSIS — E440 Moderate protein-calorie malnutrition: Secondary | ICD-10-CM | POA: Diagnosis not present

## 2019-10-18 DIAGNOSIS — N186 End stage renal disease: Secondary | ICD-10-CM | POA: Diagnosis not present

## 2019-10-18 DIAGNOSIS — N2581 Secondary hyperparathyroidism of renal origin: Secondary | ICD-10-CM | POA: Diagnosis not present

## 2019-10-18 DIAGNOSIS — Z79899 Other long term (current) drug therapy: Secondary | ICD-10-CM | POA: Diagnosis not present

## 2019-10-18 DIAGNOSIS — D509 Iron deficiency anemia, unspecified: Secondary | ICD-10-CM | POA: Diagnosis not present

## 2019-10-18 DIAGNOSIS — Z992 Dependence on renal dialysis: Secondary | ICD-10-CM | POA: Diagnosis not present

## 2019-10-18 DIAGNOSIS — D631 Anemia in chronic kidney disease: Secondary | ICD-10-CM | POA: Diagnosis not present

## 2019-10-18 DIAGNOSIS — N2589 Other disorders resulting from impaired renal tubular function: Secondary | ICD-10-CM | POA: Diagnosis not present

## 2019-10-18 DIAGNOSIS — Z4932 Encounter for adequacy testing for peritoneal dialysis: Secondary | ICD-10-CM | POA: Diagnosis not present

## 2019-10-19 DIAGNOSIS — Z4932 Encounter for adequacy testing for peritoneal dialysis: Secondary | ICD-10-CM | POA: Diagnosis not present

## 2019-10-19 DIAGNOSIS — E440 Moderate protein-calorie malnutrition: Secondary | ICD-10-CM | POA: Diagnosis not present

## 2019-10-19 DIAGNOSIS — N2589 Other disorders resulting from impaired renal tubular function: Secondary | ICD-10-CM | POA: Diagnosis not present

## 2019-10-19 DIAGNOSIS — Z992 Dependence on renal dialysis: Secondary | ICD-10-CM | POA: Diagnosis not present

## 2019-10-19 DIAGNOSIS — Z79899 Other long term (current) drug therapy: Secondary | ICD-10-CM | POA: Diagnosis not present

## 2019-10-19 DIAGNOSIS — N2581 Secondary hyperparathyroidism of renal origin: Secondary | ICD-10-CM | POA: Diagnosis not present

## 2019-10-19 DIAGNOSIS — D631 Anemia in chronic kidney disease: Secondary | ICD-10-CM | POA: Diagnosis not present

## 2019-10-19 DIAGNOSIS — N186 End stage renal disease: Secondary | ICD-10-CM | POA: Diagnosis not present

## 2019-10-19 DIAGNOSIS — D509 Iron deficiency anemia, unspecified: Secondary | ICD-10-CM | POA: Diagnosis not present

## 2019-10-20 DIAGNOSIS — Z992 Dependence on renal dialysis: Secondary | ICD-10-CM | POA: Diagnosis not present

## 2019-10-20 DIAGNOSIS — N186 End stage renal disease: Secondary | ICD-10-CM | POA: Diagnosis not present

## 2019-10-20 DIAGNOSIS — N2581 Secondary hyperparathyroidism of renal origin: Secondary | ICD-10-CM | POA: Diagnosis not present

## 2019-10-20 DIAGNOSIS — N2589 Other disorders resulting from impaired renal tubular function: Secondary | ICD-10-CM | POA: Diagnosis not present

## 2019-10-20 DIAGNOSIS — D509 Iron deficiency anemia, unspecified: Secondary | ICD-10-CM | POA: Diagnosis not present

## 2019-10-20 DIAGNOSIS — D631 Anemia in chronic kidney disease: Secondary | ICD-10-CM | POA: Diagnosis not present

## 2019-10-20 DIAGNOSIS — Z4932 Encounter for adequacy testing for peritoneal dialysis: Secondary | ICD-10-CM | POA: Diagnosis not present

## 2019-10-20 DIAGNOSIS — Z79899 Other long term (current) drug therapy: Secondary | ICD-10-CM | POA: Diagnosis not present

## 2019-10-20 DIAGNOSIS — E440 Moderate protein-calorie malnutrition: Secondary | ICD-10-CM | POA: Diagnosis not present

## 2019-10-21 DIAGNOSIS — D631 Anemia in chronic kidney disease: Secondary | ICD-10-CM | POA: Diagnosis not present

## 2019-10-21 DIAGNOSIS — D509 Iron deficiency anemia, unspecified: Secondary | ICD-10-CM | POA: Diagnosis not present

## 2019-10-21 DIAGNOSIS — Z79899 Other long term (current) drug therapy: Secondary | ICD-10-CM | POA: Diagnosis not present

## 2019-10-21 DIAGNOSIS — Z4932 Encounter for adequacy testing for peritoneal dialysis: Secondary | ICD-10-CM | POA: Diagnosis not present

## 2019-10-21 DIAGNOSIS — N2589 Other disorders resulting from impaired renal tubular function: Secondary | ICD-10-CM | POA: Diagnosis not present

## 2019-10-21 DIAGNOSIS — N186 End stage renal disease: Secondary | ICD-10-CM | POA: Diagnosis not present

## 2019-10-21 DIAGNOSIS — E440 Moderate protein-calorie malnutrition: Secondary | ICD-10-CM | POA: Diagnosis not present

## 2019-10-21 DIAGNOSIS — Z992 Dependence on renal dialysis: Secondary | ICD-10-CM | POA: Diagnosis not present

## 2019-10-21 DIAGNOSIS — N2581 Secondary hyperparathyroidism of renal origin: Secondary | ICD-10-CM | POA: Diagnosis not present

## 2019-10-22 DIAGNOSIS — D631 Anemia in chronic kidney disease: Secondary | ICD-10-CM | POA: Diagnosis not present

## 2019-10-22 DIAGNOSIS — N2581 Secondary hyperparathyroidism of renal origin: Secondary | ICD-10-CM | POA: Diagnosis not present

## 2019-10-22 DIAGNOSIS — E440 Moderate protein-calorie malnutrition: Secondary | ICD-10-CM | POA: Diagnosis not present

## 2019-10-22 DIAGNOSIS — Z79899 Other long term (current) drug therapy: Secondary | ICD-10-CM | POA: Diagnosis not present

## 2019-10-22 DIAGNOSIS — N2589 Other disorders resulting from impaired renal tubular function: Secondary | ICD-10-CM | POA: Diagnosis not present

## 2019-10-22 DIAGNOSIS — Z992 Dependence on renal dialysis: Secondary | ICD-10-CM | POA: Diagnosis not present

## 2019-10-22 DIAGNOSIS — N186 End stage renal disease: Secondary | ICD-10-CM | POA: Diagnosis not present

## 2019-10-22 DIAGNOSIS — D509 Iron deficiency anemia, unspecified: Secondary | ICD-10-CM | POA: Diagnosis not present

## 2019-10-22 DIAGNOSIS — Z4932 Encounter for adequacy testing for peritoneal dialysis: Secondary | ICD-10-CM | POA: Diagnosis not present

## 2019-10-23 DIAGNOSIS — N2581 Secondary hyperparathyroidism of renal origin: Secondary | ICD-10-CM | POA: Diagnosis not present

## 2019-10-23 DIAGNOSIS — N186 End stage renal disease: Secondary | ICD-10-CM | POA: Diagnosis not present

## 2019-10-23 DIAGNOSIS — D509 Iron deficiency anemia, unspecified: Secondary | ICD-10-CM | POA: Diagnosis not present

## 2019-10-23 DIAGNOSIS — Z992 Dependence on renal dialysis: Secondary | ICD-10-CM | POA: Diagnosis not present

## 2019-10-23 DIAGNOSIS — Z79899 Other long term (current) drug therapy: Secondary | ICD-10-CM | POA: Diagnosis not present

## 2019-10-23 DIAGNOSIS — N2589 Other disorders resulting from impaired renal tubular function: Secondary | ICD-10-CM | POA: Diagnosis not present

## 2019-10-23 DIAGNOSIS — E440 Moderate protein-calorie malnutrition: Secondary | ICD-10-CM | POA: Diagnosis not present

## 2019-10-23 DIAGNOSIS — D631 Anemia in chronic kidney disease: Secondary | ICD-10-CM | POA: Diagnosis not present

## 2019-10-23 DIAGNOSIS — Z4932 Encounter for adequacy testing for peritoneal dialysis: Secondary | ICD-10-CM | POA: Diagnosis not present

## 2019-10-24 DIAGNOSIS — N2581 Secondary hyperparathyroidism of renal origin: Secondary | ICD-10-CM | POA: Diagnosis not present

## 2019-10-24 DIAGNOSIS — Z79899 Other long term (current) drug therapy: Secondary | ICD-10-CM | POA: Diagnosis not present

## 2019-10-24 DIAGNOSIS — E440 Moderate protein-calorie malnutrition: Secondary | ICD-10-CM | POA: Diagnosis not present

## 2019-10-24 DIAGNOSIS — D509 Iron deficiency anemia, unspecified: Secondary | ICD-10-CM | POA: Diagnosis not present

## 2019-10-24 DIAGNOSIS — Z4932 Encounter for adequacy testing for peritoneal dialysis: Secondary | ICD-10-CM | POA: Diagnosis not present

## 2019-10-24 DIAGNOSIS — N186 End stage renal disease: Secondary | ICD-10-CM | POA: Diagnosis not present

## 2019-10-24 DIAGNOSIS — D631 Anemia in chronic kidney disease: Secondary | ICD-10-CM | POA: Diagnosis not present

## 2019-10-24 DIAGNOSIS — N2589 Other disorders resulting from impaired renal tubular function: Secondary | ICD-10-CM | POA: Diagnosis not present

## 2019-10-24 DIAGNOSIS — Z992 Dependence on renal dialysis: Secondary | ICD-10-CM | POA: Diagnosis not present

## 2019-10-25 DIAGNOSIS — N186 End stage renal disease: Secondary | ICD-10-CM | POA: Diagnosis not present

## 2019-10-25 DIAGNOSIS — Z79899 Other long term (current) drug therapy: Secondary | ICD-10-CM | POA: Diagnosis not present

## 2019-10-25 DIAGNOSIS — E440 Moderate protein-calorie malnutrition: Secondary | ICD-10-CM | POA: Diagnosis not present

## 2019-10-25 DIAGNOSIS — D509 Iron deficiency anemia, unspecified: Secondary | ICD-10-CM | POA: Diagnosis not present

## 2019-10-25 DIAGNOSIS — Z4932 Encounter for adequacy testing for peritoneal dialysis: Secondary | ICD-10-CM | POA: Diagnosis not present

## 2019-10-25 DIAGNOSIS — N2589 Other disorders resulting from impaired renal tubular function: Secondary | ICD-10-CM | POA: Diagnosis not present

## 2019-10-25 DIAGNOSIS — N2581 Secondary hyperparathyroidism of renal origin: Secondary | ICD-10-CM | POA: Diagnosis not present

## 2019-10-25 DIAGNOSIS — Z992 Dependence on renal dialysis: Secondary | ICD-10-CM | POA: Diagnosis not present

## 2019-10-25 DIAGNOSIS — D631 Anemia in chronic kidney disease: Secondary | ICD-10-CM | POA: Diagnosis not present

## 2019-10-26 DIAGNOSIS — N186 End stage renal disease: Secondary | ICD-10-CM | POA: Diagnosis not present

## 2019-10-26 DIAGNOSIS — Z4932 Encounter for adequacy testing for peritoneal dialysis: Secondary | ICD-10-CM | POA: Diagnosis not present

## 2019-10-26 DIAGNOSIS — Z992 Dependence on renal dialysis: Secondary | ICD-10-CM | POA: Diagnosis not present

## 2019-10-26 DIAGNOSIS — D509 Iron deficiency anemia, unspecified: Secondary | ICD-10-CM | POA: Diagnosis not present

## 2019-10-26 DIAGNOSIS — D631 Anemia in chronic kidney disease: Secondary | ICD-10-CM | POA: Diagnosis not present

## 2019-10-26 DIAGNOSIS — N2581 Secondary hyperparathyroidism of renal origin: Secondary | ICD-10-CM | POA: Diagnosis not present

## 2019-10-26 DIAGNOSIS — N2589 Other disorders resulting from impaired renal tubular function: Secondary | ICD-10-CM | POA: Diagnosis not present

## 2019-10-26 DIAGNOSIS — E440 Moderate protein-calorie malnutrition: Secondary | ICD-10-CM | POA: Diagnosis not present

## 2019-10-26 DIAGNOSIS — Z79899 Other long term (current) drug therapy: Secondary | ICD-10-CM | POA: Diagnosis not present

## 2019-10-27 DIAGNOSIS — N186 End stage renal disease: Secondary | ICD-10-CM | POA: Diagnosis not present

## 2019-10-27 DIAGNOSIS — Z992 Dependence on renal dialysis: Secondary | ICD-10-CM | POA: Diagnosis not present

## 2019-10-27 DIAGNOSIS — D509 Iron deficiency anemia, unspecified: Secondary | ICD-10-CM | POA: Diagnosis not present

## 2019-10-27 DIAGNOSIS — E440 Moderate protein-calorie malnutrition: Secondary | ICD-10-CM | POA: Diagnosis not present

## 2019-10-27 DIAGNOSIS — N2581 Secondary hyperparathyroidism of renal origin: Secondary | ICD-10-CM | POA: Diagnosis not present

## 2019-10-27 DIAGNOSIS — N2589 Other disorders resulting from impaired renal tubular function: Secondary | ICD-10-CM | POA: Diagnosis not present

## 2019-10-27 DIAGNOSIS — Z79899 Other long term (current) drug therapy: Secondary | ICD-10-CM | POA: Diagnosis not present

## 2019-10-27 DIAGNOSIS — D631 Anemia in chronic kidney disease: Secondary | ICD-10-CM | POA: Diagnosis not present

## 2019-10-27 DIAGNOSIS — Z4932 Encounter for adequacy testing for peritoneal dialysis: Secondary | ICD-10-CM | POA: Diagnosis not present

## 2019-10-28 DIAGNOSIS — N2581 Secondary hyperparathyroidism of renal origin: Secondary | ICD-10-CM | POA: Diagnosis not present

## 2019-10-28 DIAGNOSIS — E440 Moderate protein-calorie malnutrition: Secondary | ICD-10-CM | POA: Diagnosis not present

## 2019-10-28 DIAGNOSIS — Z79899 Other long term (current) drug therapy: Secondary | ICD-10-CM | POA: Diagnosis not present

## 2019-10-28 DIAGNOSIS — N2589 Other disorders resulting from impaired renal tubular function: Secondary | ICD-10-CM | POA: Diagnosis not present

## 2019-10-28 DIAGNOSIS — D631 Anemia in chronic kidney disease: Secondary | ICD-10-CM | POA: Diagnosis not present

## 2019-10-28 DIAGNOSIS — D509 Iron deficiency anemia, unspecified: Secondary | ICD-10-CM | POA: Diagnosis not present

## 2019-10-28 DIAGNOSIS — Z4932 Encounter for adequacy testing for peritoneal dialysis: Secondary | ICD-10-CM | POA: Diagnosis not present

## 2019-10-28 DIAGNOSIS — N186 End stage renal disease: Secondary | ICD-10-CM | POA: Diagnosis not present

## 2019-10-28 DIAGNOSIS — Z992 Dependence on renal dialysis: Secondary | ICD-10-CM | POA: Diagnosis not present

## 2019-10-29 DIAGNOSIS — N186 End stage renal disease: Secondary | ICD-10-CM | POA: Diagnosis not present

## 2019-10-29 DIAGNOSIS — Z79899 Other long term (current) drug therapy: Secondary | ICD-10-CM | POA: Diagnosis not present

## 2019-10-29 DIAGNOSIS — D509 Iron deficiency anemia, unspecified: Secondary | ICD-10-CM | POA: Diagnosis not present

## 2019-10-29 DIAGNOSIS — Z992 Dependence on renal dialysis: Secondary | ICD-10-CM | POA: Diagnosis not present

## 2019-10-29 DIAGNOSIS — E440 Moderate protein-calorie malnutrition: Secondary | ICD-10-CM | POA: Diagnosis not present

## 2019-10-29 DIAGNOSIS — N2589 Other disorders resulting from impaired renal tubular function: Secondary | ICD-10-CM | POA: Diagnosis not present

## 2019-10-29 DIAGNOSIS — N2581 Secondary hyperparathyroidism of renal origin: Secondary | ICD-10-CM | POA: Diagnosis not present

## 2019-10-29 DIAGNOSIS — D631 Anemia in chronic kidney disease: Secondary | ICD-10-CM | POA: Diagnosis not present

## 2019-10-29 DIAGNOSIS — Z4932 Encounter for adequacy testing for peritoneal dialysis: Secondary | ICD-10-CM | POA: Diagnosis not present

## 2019-10-30 DIAGNOSIS — N2589 Other disorders resulting from impaired renal tubular function: Secondary | ICD-10-CM | POA: Diagnosis not present

## 2019-10-30 DIAGNOSIS — D509 Iron deficiency anemia, unspecified: Secondary | ICD-10-CM | POA: Diagnosis not present

## 2019-10-30 DIAGNOSIS — Z992 Dependence on renal dialysis: Secondary | ICD-10-CM | POA: Diagnosis not present

## 2019-10-30 DIAGNOSIS — N2581 Secondary hyperparathyroidism of renal origin: Secondary | ICD-10-CM | POA: Diagnosis not present

## 2019-10-30 DIAGNOSIS — Z4932 Encounter for adequacy testing for peritoneal dialysis: Secondary | ICD-10-CM | POA: Diagnosis not present

## 2019-10-30 DIAGNOSIS — D631 Anemia in chronic kidney disease: Secondary | ICD-10-CM | POA: Diagnosis not present

## 2019-10-30 DIAGNOSIS — N186 End stage renal disease: Secondary | ICD-10-CM | POA: Diagnosis not present

## 2019-10-30 DIAGNOSIS — Z79899 Other long term (current) drug therapy: Secondary | ICD-10-CM | POA: Diagnosis not present

## 2019-10-30 DIAGNOSIS — E440 Moderate protein-calorie malnutrition: Secondary | ICD-10-CM | POA: Diagnosis not present

## 2019-10-31 DIAGNOSIS — Z79899 Other long term (current) drug therapy: Secondary | ICD-10-CM | POA: Diagnosis not present

## 2019-10-31 DIAGNOSIS — Z992 Dependence on renal dialysis: Secondary | ICD-10-CM | POA: Diagnosis not present

## 2019-10-31 DIAGNOSIS — E440 Moderate protein-calorie malnutrition: Secondary | ICD-10-CM | POA: Diagnosis not present

## 2019-10-31 DIAGNOSIS — D631 Anemia in chronic kidney disease: Secondary | ICD-10-CM | POA: Diagnosis not present

## 2019-10-31 DIAGNOSIS — N2581 Secondary hyperparathyroidism of renal origin: Secondary | ICD-10-CM | POA: Diagnosis not present

## 2019-10-31 DIAGNOSIS — N186 End stage renal disease: Secondary | ICD-10-CM | POA: Diagnosis not present

## 2019-10-31 DIAGNOSIS — N2589 Other disorders resulting from impaired renal tubular function: Secondary | ICD-10-CM | POA: Diagnosis not present

## 2019-10-31 DIAGNOSIS — Z4932 Encounter for adequacy testing for peritoneal dialysis: Secondary | ICD-10-CM | POA: Diagnosis not present

## 2019-10-31 DIAGNOSIS — D509 Iron deficiency anemia, unspecified: Secondary | ICD-10-CM | POA: Diagnosis not present

## 2019-11-01 DIAGNOSIS — Z4932 Encounter for adequacy testing for peritoneal dialysis: Secondary | ICD-10-CM | POA: Diagnosis not present

## 2019-11-01 DIAGNOSIS — N2581 Secondary hyperparathyroidism of renal origin: Secondary | ICD-10-CM | POA: Diagnosis not present

## 2019-11-01 DIAGNOSIS — Z992 Dependence on renal dialysis: Secondary | ICD-10-CM | POA: Diagnosis not present

## 2019-11-01 DIAGNOSIS — N186 End stage renal disease: Secondary | ICD-10-CM | POA: Diagnosis not present

## 2019-11-01 DIAGNOSIS — E440 Moderate protein-calorie malnutrition: Secondary | ICD-10-CM | POA: Diagnosis not present

## 2019-11-01 DIAGNOSIS — D631 Anemia in chronic kidney disease: Secondary | ICD-10-CM | POA: Diagnosis not present

## 2019-11-01 DIAGNOSIS — D509 Iron deficiency anemia, unspecified: Secondary | ICD-10-CM | POA: Diagnosis not present

## 2019-11-01 DIAGNOSIS — Z79899 Other long term (current) drug therapy: Secondary | ICD-10-CM | POA: Diagnosis not present

## 2019-11-01 DIAGNOSIS — N2589 Other disorders resulting from impaired renal tubular function: Secondary | ICD-10-CM | POA: Diagnosis not present

## 2019-11-02 DIAGNOSIS — D631 Anemia in chronic kidney disease: Secondary | ICD-10-CM | POA: Diagnosis not present

## 2019-11-02 DIAGNOSIS — Z992 Dependence on renal dialysis: Secondary | ICD-10-CM | POA: Diagnosis not present

## 2019-11-02 DIAGNOSIS — Z79899 Other long term (current) drug therapy: Secondary | ICD-10-CM | POA: Diagnosis not present

## 2019-11-02 DIAGNOSIS — N186 End stage renal disease: Secondary | ICD-10-CM | POA: Diagnosis not present

## 2019-11-02 DIAGNOSIS — D509 Iron deficiency anemia, unspecified: Secondary | ICD-10-CM | POA: Diagnosis not present

## 2019-11-02 DIAGNOSIS — E440 Moderate protein-calorie malnutrition: Secondary | ICD-10-CM | POA: Diagnosis not present

## 2019-11-02 DIAGNOSIS — Z4932 Encounter for adequacy testing for peritoneal dialysis: Secondary | ICD-10-CM | POA: Diagnosis not present

## 2019-11-02 DIAGNOSIS — N2581 Secondary hyperparathyroidism of renal origin: Secondary | ICD-10-CM | POA: Diagnosis not present

## 2019-11-02 DIAGNOSIS — N2589 Other disorders resulting from impaired renal tubular function: Secondary | ICD-10-CM | POA: Diagnosis not present

## 2019-11-03 DIAGNOSIS — E440 Moderate protein-calorie malnutrition: Secondary | ICD-10-CM | POA: Diagnosis not present

## 2019-11-03 DIAGNOSIS — Z992 Dependence on renal dialysis: Secondary | ICD-10-CM | POA: Diagnosis not present

## 2019-11-03 DIAGNOSIS — N186 End stage renal disease: Secondary | ICD-10-CM | POA: Diagnosis not present

## 2019-11-03 DIAGNOSIS — Z79899 Other long term (current) drug therapy: Secondary | ICD-10-CM | POA: Diagnosis not present

## 2019-11-03 DIAGNOSIS — N2581 Secondary hyperparathyroidism of renal origin: Secondary | ICD-10-CM | POA: Diagnosis not present

## 2019-11-03 DIAGNOSIS — D631 Anemia in chronic kidney disease: Secondary | ICD-10-CM | POA: Diagnosis not present

## 2019-11-03 DIAGNOSIS — D509 Iron deficiency anemia, unspecified: Secondary | ICD-10-CM | POA: Diagnosis not present

## 2019-11-03 DIAGNOSIS — N2589 Other disorders resulting from impaired renal tubular function: Secondary | ICD-10-CM | POA: Diagnosis not present

## 2019-11-03 DIAGNOSIS — Z4932 Encounter for adequacy testing for peritoneal dialysis: Secondary | ICD-10-CM | POA: Diagnosis not present

## 2019-11-04 DIAGNOSIS — D509 Iron deficiency anemia, unspecified: Secondary | ICD-10-CM | POA: Diagnosis not present

## 2019-11-04 DIAGNOSIS — E440 Moderate protein-calorie malnutrition: Secondary | ICD-10-CM | POA: Diagnosis not present

## 2019-11-04 DIAGNOSIS — Z4932 Encounter for adequacy testing for peritoneal dialysis: Secondary | ICD-10-CM | POA: Diagnosis not present

## 2019-11-04 DIAGNOSIS — Z992 Dependence on renal dialysis: Secondary | ICD-10-CM | POA: Diagnosis not present

## 2019-11-04 DIAGNOSIS — N2581 Secondary hyperparathyroidism of renal origin: Secondary | ICD-10-CM | POA: Diagnosis not present

## 2019-11-04 DIAGNOSIS — D631 Anemia in chronic kidney disease: Secondary | ICD-10-CM | POA: Diagnosis not present

## 2019-11-04 DIAGNOSIS — N186 End stage renal disease: Secondary | ICD-10-CM | POA: Diagnosis not present

## 2019-11-04 DIAGNOSIS — N2589 Other disorders resulting from impaired renal tubular function: Secondary | ICD-10-CM | POA: Diagnosis not present

## 2019-11-04 DIAGNOSIS — Z79899 Other long term (current) drug therapy: Secondary | ICD-10-CM | POA: Diagnosis not present

## 2019-11-05 DIAGNOSIS — N186 End stage renal disease: Secondary | ICD-10-CM | POA: Diagnosis not present

## 2019-11-05 DIAGNOSIS — N2589 Other disorders resulting from impaired renal tubular function: Secondary | ICD-10-CM | POA: Diagnosis not present

## 2019-11-05 DIAGNOSIS — E440 Moderate protein-calorie malnutrition: Secondary | ICD-10-CM | POA: Diagnosis not present

## 2019-11-05 DIAGNOSIS — D509 Iron deficiency anemia, unspecified: Secondary | ICD-10-CM | POA: Diagnosis not present

## 2019-11-05 DIAGNOSIS — N2581 Secondary hyperparathyroidism of renal origin: Secondary | ICD-10-CM | POA: Diagnosis not present

## 2019-11-05 DIAGNOSIS — D631 Anemia in chronic kidney disease: Secondary | ICD-10-CM | POA: Diagnosis not present

## 2019-11-05 DIAGNOSIS — Z992 Dependence on renal dialysis: Secondary | ICD-10-CM | POA: Diagnosis not present

## 2019-11-05 DIAGNOSIS — Z79899 Other long term (current) drug therapy: Secondary | ICD-10-CM | POA: Diagnosis not present

## 2019-11-05 DIAGNOSIS — Z4932 Encounter for adequacy testing for peritoneal dialysis: Secondary | ICD-10-CM | POA: Diagnosis not present

## 2019-11-05 DIAGNOSIS — I129 Hypertensive chronic kidney disease with stage 1 through stage 4 chronic kidney disease, or unspecified chronic kidney disease: Secondary | ICD-10-CM | POA: Diagnosis not present

## 2019-11-06 DIAGNOSIS — Z992 Dependence on renal dialysis: Secondary | ICD-10-CM | POA: Diagnosis not present

## 2019-11-06 DIAGNOSIS — D631 Anemia in chronic kidney disease: Secondary | ICD-10-CM | POA: Diagnosis not present

## 2019-11-06 DIAGNOSIS — E440 Moderate protein-calorie malnutrition: Secondary | ICD-10-CM | POA: Diagnosis not present

## 2019-11-06 DIAGNOSIS — E7841 Elevated Lipoprotein(a): Secondary | ICD-10-CM | POA: Diagnosis not present

## 2019-11-06 DIAGNOSIS — D509 Iron deficiency anemia, unspecified: Secondary | ICD-10-CM | POA: Diagnosis not present

## 2019-11-06 DIAGNOSIS — N186 End stage renal disease: Secondary | ICD-10-CM | POA: Diagnosis not present

## 2019-11-06 DIAGNOSIS — Z79899 Other long term (current) drug therapy: Secondary | ICD-10-CM | POA: Diagnosis not present

## 2019-11-06 DIAGNOSIS — N2581 Secondary hyperparathyroidism of renal origin: Secondary | ICD-10-CM | POA: Diagnosis not present

## 2019-11-06 DIAGNOSIS — N2589 Other disorders resulting from impaired renal tubular function: Secondary | ICD-10-CM | POA: Diagnosis not present

## 2019-11-07 DIAGNOSIS — E440 Moderate protein-calorie malnutrition: Secondary | ICD-10-CM | POA: Diagnosis not present

## 2019-11-07 DIAGNOSIS — D509 Iron deficiency anemia, unspecified: Secondary | ICD-10-CM | POA: Diagnosis not present

## 2019-11-07 DIAGNOSIS — D631 Anemia in chronic kidney disease: Secondary | ICD-10-CM | POA: Diagnosis not present

## 2019-11-07 DIAGNOSIS — N2581 Secondary hyperparathyroidism of renal origin: Secondary | ICD-10-CM | POA: Diagnosis not present

## 2019-11-07 DIAGNOSIS — E7841 Elevated Lipoprotein(a): Secondary | ICD-10-CM | POA: Diagnosis not present

## 2019-11-07 DIAGNOSIS — Z992 Dependence on renal dialysis: Secondary | ICD-10-CM | POA: Diagnosis not present

## 2019-11-07 DIAGNOSIS — N186 End stage renal disease: Secondary | ICD-10-CM | POA: Diagnosis not present

## 2019-11-07 DIAGNOSIS — N2589 Other disorders resulting from impaired renal tubular function: Secondary | ICD-10-CM | POA: Diagnosis not present

## 2019-11-07 DIAGNOSIS — Z79899 Other long term (current) drug therapy: Secondary | ICD-10-CM | POA: Diagnosis not present

## 2019-11-08 DIAGNOSIS — N2589 Other disorders resulting from impaired renal tubular function: Secondary | ICD-10-CM | POA: Diagnosis not present

## 2019-11-08 DIAGNOSIS — E440 Moderate protein-calorie malnutrition: Secondary | ICD-10-CM | POA: Diagnosis not present

## 2019-11-08 DIAGNOSIS — D631 Anemia in chronic kidney disease: Secondary | ICD-10-CM | POA: Diagnosis not present

## 2019-11-08 DIAGNOSIS — N2581 Secondary hyperparathyroidism of renal origin: Secondary | ICD-10-CM | POA: Diagnosis not present

## 2019-11-08 DIAGNOSIS — E7841 Elevated Lipoprotein(a): Secondary | ICD-10-CM | POA: Diagnosis not present

## 2019-11-08 DIAGNOSIS — Z79899 Other long term (current) drug therapy: Secondary | ICD-10-CM | POA: Diagnosis not present

## 2019-11-08 DIAGNOSIS — D509 Iron deficiency anemia, unspecified: Secondary | ICD-10-CM | POA: Diagnosis not present

## 2019-11-08 DIAGNOSIS — N186 End stage renal disease: Secondary | ICD-10-CM | POA: Diagnosis not present

## 2019-11-08 DIAGNOSIS — Z992 Dependence on renal dialysis: Secondary | ICD-10-CM | POA: Diagnosis not present

## 2019-11-09 DIAGNOSIS — N2581 Secondary hyperparathyroidism of renal origin: Secondary | ICD-10-CM | POA: Diagnosis not present

## 2019-11-09 DIAGNOSIS — E7841 Elevated Lipoprotein(a): Secondary | ICD-10-CM | POA: Diagnosis not present

## 2019-11-09 DIAGNOSIS — E440 Moderate protein-calorie malnutrition: Secondary | ICD-10-CM | POA: Diagnosis not present

## 2019-11-09 DIAGNOSIS — D631 Anemia in chronic kidney disease: Secondary | ICD-10-CM | POA: Diagnosis not present

## 2019-11-09 DIAGNOSIS — N186 End stage renal disease: Secondary | ICD-10-CM | POA: Diagnosis not present

## 2019-11-09 DIAGNOSIS — D509 Iron deficiency anemia, unspecified: Secondary | ICD-10-CM | POA: Diagnosis not present

## 2019-11-09 DIAGNOSIS — Z79899 Other long term (current) drug therapy: Secondary | ICD-10-CM | POA: Diagnosis not present

## 2019-11-09 DIAGNOSIS — Z992 Dependence on renal dialysis: Secondary | ICD-10-CM | POA: Diagnosis not present

## 2019-11-09 DIAGNOSIS — N2589 Other disorders resulting from impaired renal tubular function: Secondary | ICD-10-CM | POA: Diagnosis not present

## 2019-11-10 DIAGNOSIS — D509 Iron deficiency anemia, unspecified: Secondary | ICD-10-CM | POA: Diagnosis not present

## 2019-11-10 DIAGNOSIS — E440 Moderate protein-calorie malnutrition: Secondary | ICD-10-CM | POA: Diagnosis not present

## 2019-11-10 DIAGNOSIS — E7841 Elevated Lipoprotein(a): Secondary | ICD-10-CM | POA: Diagnosis not present

## 2019-11-10 DIAGNOSIS — D631 Anemia in chronic kidney disease: Secondary | ICD-10-CM | POA: Diagnosis not present

## 2019-11-10 DIAGNOSIS — Z992 Dependence on renal dialysis: Secondary | ICD-10-CM | POA: Diagnosis not present

## 2019-11-10 DIAGNOSIS — N2581 Secondary hyperparathyroidism of renal origin: Secondary | ICD-10-CM | POA: Diagnosis not present

## 2019-11-10 DIAGNOSIS — N2589 Other disorders resulting from impaired renal tubular function: Secondary | ICD-10-CM | POA: Diagnosis not present

## 2019-11-10 DIAGNOSIS — Z79899 Other long term (current) drug therapy: Secondary | ICD-10-CM | POA: Diagnosis not present

## 2019-11-10 DIAGNOSIS — N186 End stage renal disease: Secondary | ICD-10-CM | POA: Diagnosis not present

## 2019-11-11 DIAGNOSIS — D631 Anemia in chronic kidney disease: Secondary | ICD-10-CM | POA: Diagnosis not present

## 2019-11-11 DIAGNOSIS — E440 Moderate protein-calorie malnutrition: Secondary | ICD-10-CM | POA: Diagnosis not present

## 2019-11-11 DIAGNOSIS — Z992 Dependence on renal dialysis: Secondary | ICD-10-CM | POA: Diagnosis not present

## 2019-11-11 DIAGNOSIS — D509 Iron deficiency anemia, unspecified: Secondary | ICD-10-CM | POA: Diagnosis not present

## 2019-11-11 DIAGNOSIS — N186 End stage renal disease: Secondary | ICD-10-CM | POA: Diagnosis not present

## 2019-11-11 DIAGNOSIS — N2581 Secondary hyperparathyroidism of renal origin: Secondary | ICD-10-CM | POA: Diagnosis not present

## 2019-11-11 DIAGNOSIS — E7841 Elevated Lipoprotein(a): Secondary | ICD-10-CM | POA: Diagnosis not present

## 2019-11-11 DIAGNOSIS — Z79899 Other long term (current) drug therapy: Secondary | ICD-10-CM | POA: Diagnosis not present

## 2019-11-11 DIAGNOSIS — N2589 Other disorders resulting from impaired renal tubular function: Secondary | ICD-10-CM | POA: Diagnosis not present

## 2019-11-12 DIAGNOSIS — D509 Iron deficiency anemia, unspecified: Secondary | ICD-10-CM | POA: Diagnosis not present

## 2019-11-12 DIAGNOSIS — E7841 Elevated Lipoprotein(a): Secondary | ICD-10-CM | POA: Diagnosis not present

## 2019-11-12 DIAGNOSIS — N2589 Other disorders resulting from impaired renal tubular function: Secondary | ICD-10-CM | POA: Diagnosis not present

## 2019-11-12 DIAGNOSIS — N2581 Secondary hyperparathyroidism of renal origin: Secondary | ICD-10-CM | POA: Diagnosis not present

## 2019-11-12 DIAGNOSIS — Z992 Dependence on renal dialysis: Secondary | ICD-10-CM | POA: Diagnosis not present

## 2019-11-12 DIAGNOSIS — E440 Moderate protein-calorie malnutrition: Secondary | ICD-10-CM | POA: Diagnosis not present

## 2019-11-12 DIAGNOSIS — Z79899 Other long term (current) drug therapy: Secondary | ICD-10-CM | POA: Diagnosis not present

## 2019-11-12 DIAGNOSIS — D631 Anemia in chronic kidney disease: Secondary | ICD-10-CM | POA: Diagnosis not present

## 2019-11-12 DIAGNOSIS — N186 End stage renal disease: Secondary | ICD-10-CM | POA: Diagnosis not present

## 2019-11-13 DIAGNOSIS — N2589 Other disorders resulting from impaired renal tubular function: Secondary | ICD-10-CM | POA: Diagnosis not present

## 2019-11-13 DIAGNOSIS — N186 End stage renal disease: Secondary | ICD-10-CM | POA: Diagnosis not present

## 2019-11-13 DIAGNOSIS — Z79899 Other long term (current) drug therapy: Secondary | ICD-10-CM | POA: Diagnosis not present

## 2019-11-13 DIAGNOSIS — E7841 Elevated Lipoprotein(a): Secondary | ICD-10-CM | POA: Diagnosis not present

## 2019-11-13 DIAGNOSIS — D631 Anemia in chronic kidney disease: Secondary | ICD-10-CM | POA: Diagnosis not present

## 2019-11-13 DIAGNOSIS — N2581 Secondary hyperparathyroidism of renal origin: Secondary | ICD-10-CM | POA: Diagnosis not present

## 2019-11-13 DIAGNOSIS — Z992 Dependence on renal dialysis: Secondary | ICD-10-CM | POA: Diagnosis not present

## 2019-11-13 DIAGNOSIS — D509 Iron deficiency anemia, unspecified: Secondary | ICD-10-CM | POA: Diagnosis not present

## 2019-11-13 DIAGNOSIS — E440 Moderate protein-calorie malnutrition: Secondary | ICD-10-CM | POA: Diagnosis not present

## 2019-11-14 DIAGNOSIS — N186 End stage renal disease: Secondary | ICD-10-CM | POA: Diagnosis not present

## 2019-11-14 DIAGNOSIS — E7841 Elevated Lipoprotein(a): Secondary | ICD-10-CM | POA: Diagnosis not present

## 2019-11-14 DIAGNOSIS — D631 Anemia in chronic kidney disease: Secondary | ICD-10-CM | POA: Diagnosis not present

## 2019-11-14 DIAGNOSIS — N2581 Secondary hyperparathyroidism of renal origin: Secondary | ICD-10-CM | POA: Diagnosis not present

## 2019-11-14 DIAGNOSIS — E440 Moderate protein-calorie malnutrition: Secondary | ICD-10-CM | POA: Diagnosis not present

## 2019-11-14 DIAGNOSIS — N2589 Other disorders resulting from impaired renal tubular function: Secondary | ICD-10-CM | POA: Diagnosis not present

## 2019-11-14 DIAGNOSIS — Z79899 Other long term (current) drug therapy: Secondary | ICD-10-CM | POA: Diagnosis not present

## 2019-11-14 DIAGNOSIS — Z992 Dependence on renal dialysis: Secondary | ICD-10-CM | POA: Diagnosis not present

## 2019-11-14 DIAGNOSIS — D509 Iron deficiency anemia, unspecified: Secondary | ICD-10-CM | POA: Diagnosis not present

## 2019-11-15 DIAGNOSIS — N2589 Other disorders resulting from impaired renal tubular function: Secondary | ICD-10-CM | POA: Diagnosis not present

## 2019-11-15 DIAGNOSIS — N2581 Secondary hyperparathyroidism of renal origin: Secondary | ICD-10-CM | POA: Diagnosis not present

## 2019-11-15 DIAGNOSIS — Z79899 Other long term (current) drug therapy: Secondary | ICD-10-CM | POA: Diagnosis not present

## 2019-11-15 DIAGNOSIS — E7841 Elevated Lipoprotein(a): Secondary | ICD-10-CM | POA: Diagnosis not present

## 2019-11-15 DIAGNOSIS — E440 Moderate protein-calorie malnutrition: Secondary | ICD-10-CM | POA: Diagnosis not present

## 2019-11-15 DIAGNOSIS — N186 End stage renal disease: Secondary | ICD-10-CM | POA: Diagnosis not present

## 2019-11-15 DIAGNOSIS — D509 Iron deficiency anemia, unspecified: Secondary | ICD-10-CM | POA: Diagnosis not present

## 2019-11-15 DIAGNOSIS — Z992 Dependence on renal dialysis: Secondary | ICD-10-CM | POA: Diagnosis not present

## 2019-11-15 DIAGNOSIS — D631 Anemia in chronic kidney disease: Secondary | ICD-10-CM | POA: Diagnosis not present

## 2019-11-16 DIAGNOSIS — E7841 Elevated Lipoprotein(a): Secondary | ICD-10-CM | POA: Diagnosis not present

## 2019-11-16 DIAGNOSIS — N2589 Other disorders resulting from impaired renal tubular function: Secondary | ICD-10-CM | POA: Diagnosis not present

## 2019-11-16 DIAGNOSIS — D509 Iron deficiency anemia, unspecified: Secondary | ICD-10-CM | POA: Diagnosis not present

## 2019-11-16 DIAGNOSIS — N2581 Secondary hyperparathyroidism of renal origin: Secondary | ICD-10-CM | POA: Diagnosis not present

## 2019-11-16 DIAGNOSIS — Z79899 Other long term (current) drug therapy: Secondary | ICD-10-CM | POA: Diagnosis not present

## 2019-11-16 DIAGNOSIS — Z992 Dependence on renal dialysis: Secondary | ICD-10-CM | POA: Diagnosis not present

## 2019-11-16 DIAGNOSIS — N186 End stage renal disease: Secondary | ICD-10-CM | POA: Diagnosis not present

## 2019-11-16 DIAGNOSIS — E440 Moderate protein-calorie malnutrition: Secondary | ICD-10-CM | POA: Diagnosis not present

## 2019-11-16 DIAGNOSIS — D631 Anemia in chronic kidney disease: Secondary | ICD-10-CM | POA: Diagnosis not present

## 2019-11-17 DIAGNOSIS — D631 Anemia in chronic kidney disease: Secondary | ICD-10-CM | POA: Diagnosis not present

## 2019-11-17 DIAGNOSIS — N186 End stage renal disease: Secondary | ICD-10-CM | POA: Diagnosis not present

## 2019-11-17 DIAGNOSIS — D509 Iron deficiency anemia, unspecified: Secondary | ICD-10-CM | POA: Diagnosis not present

## 2019-11-17 DIAGNOSIS — E7841 Elevated Lipoprotein(a): Secondary | ICD-10-CM | POA: Diagnosis not present

## 2019-11-17 DIAGNOSIS — N2589 Other disorders resulting from impaired renal tubular function: Secondary | ICD-10-CM | POA: Diagnosis not present

## 2019-11-17 DIAGNOSIS — N2581 Secondary hyperparathyroidism of renal origin: Secondary | ICD-10-CM | POA: Diagnosis not present

## 2019-11-17 DIAGNOSIS — Z79899 Other long term (current) drug therapy: Secondary | ICD-10-CM | POA: Diagnosis not present

## 2019-11-17 DIAGNOSIS — Z992 Dependence on renal dialysis: Secondary | ICD-10-CM | POA: Diagnosis not present

## 2019-11-17 DIAGNOSIS — E440 Moderate protein-calorie malnutrition: Secondary | ICD-10-CM | POA: Diagnosis not present

## 2019-11-18 DIAGNOSIS — E440 Moderate protein-calorie malnutrition: Secondary | ICD-10-CM | POA: Diagnosis not present

## 2019-11-18 DIAGNOSIS — E7841 Elevated Lipoprotein(a): Secondary | ICD-10-CM | POA: Diagnosis not present

## 2019-11-18 DIAGNOSIS — D509 Iron deficiency anemia, unspecified: Secondary | ICD-10-CM | POA: Diagnosis not present

## 2019-11-18 DIAGNOSIS — D631 Anemia in chronic kidney disease: Secondary | ICD-10-CM | POA: Diagnosis not present

## 2019-11-18 DIAGNOSIS — N2589 Other disorders resulting from impaired renal tubular function: Secondary | ICD-10-CM | POA: Diagnosis not present

## 2019-11-18 DIAGNOSIS — N186 End stage renal disease: Secondary | ICD-10-CM | POA: Diagnosis not present

## 2019-11-18 DIAGNOSIS — N2581 Secondary hyperparathyroidism of renal origin: Secondary | ICD-10-CM | POA: Diagnosis not present

## 2019-11-18 DIAGNOSIS — Z79899 Other long term (current) drug therapy: Secondary | ICD-10-CM | POA: Diagnosis not present

## 2019-11-18 DIAGNOSIS — Z992 Dependence on renal dialysis: Secondary | ICD-10-CM | POA: Diagnosis not present

## 2019-11-19 DIAGNOSIS — Z79899 Other long term (current) drug therapy: Secondary | ICD-10-CM | POA: Diagnosis not present

## 2019-11-19 DIAGNOSIS — N2581 Secondary hyperparathyroidism of renal origin: Secondary | ICD-10-CM | POA: Diagnosis not present

## 2019-11-19 DIAGNOSIS — N186 End stage renal disease: Secondary | ICD-10-CM | POA: Diagnosis not present

## 2019-11-19 DIAGNOSIS — Z992 Dependence on renal dialysis: Secondary | ICD-10-CM | POA: Diagnosis not present

## 2019-11-19 DIAGNOSIS — E7841 Elevated Lipoprotein(a): Secondary | ICD-10-CM | POA: Diagnosis not present

## 2019-11-19 DIAGNOSIS — N2589 Other disorders resulting from impaired renal tubular function: Secondary | ICD-10-CM | POA: Diagnosis not present

## 2019-11-19 DIAGNOSIS — E440 Moderate protein-calorie malnutrition: Secondary | ICD-10-CM | POA: Diagnosis not present

## 2019-11-19 DIAGNOSIS — D631 Anemia in chronic kidney disease: Secondary | ICD-10-CM | POA: Diagnosis not present

## 2019-11-19 DIAGNOSIS — D509 Iron deficiency anemia, unspecified: Secondary | ICD-10-CM | POA: Diagnosis not present

## 2019-11-20 DIAGNOSIS — D509 Iron deficiency anemia, unspecified: Secondary | ICD-10-CM | POA: Diagnosis not present

## 2019-11-20 DIAGNOSIS — E440 Moderate protein-calorie malnutrition: Secondary | ICD-10-CM | POA: Diagnosis not present

## 2019-11-20 DIAGNOSIS — E7841 Elevated Lipoprotein(a): Secondary | ICD-10-CM | POA: Diagnosis not present

## 2019-11-20 DIAGNOSIS — Z79899 Other long term (current) drug therapy: Secondary | ICD-10-CM | POA: Diagnosis not present

## 2019-11-20 DIAGNOSIS — D631 Anemia in chronic kidney disease: Secondary | ICD-10-CM | POA: Diagnosis not present

## 2019-11-20 DIAGNOSIS — N2581 Secondary hyperparathyroidism of renal origin: Secondary | ICD-10-CM | POA: Diagnosis not present

## 2019-11-20 DIAGNOSIS — Z992 Dependence on renal dialysis: Secondary | ICD-10-CM | POA: Diagnosis not present

## 2019-11-20 DIAGNOSIS — N186 End stage renal disease: Secondary | ICD-10-CM | POA: Diagnosis not present

## 2019-11-20 DIAGNOSIS — N2589 Other disorders resulting from impaired renal tubular function: Secondary | ICD-10-CM | POA: Diagnosis not present

## 2019-11-21 DIAGNOSIS — N2581 Secondary hyperparathyroidism of renal origin: Secondary | ICD-10-CM | POA: Diagnosis not present

## 2019-11-21 DIAGNOSIS — E440 Moderate protein-calorie malnutrition: Secondary | ICD-10-CM | POA: Diagnosis not present

## 2019-11-21 DIAGNOSIS — E7841 Elevated Lipoprotein(a): Secondary | ICD-10-CM | POA: Diagnosis not present

## 2019-11-21 DIAGNOSIS — D631 Anemia in chronic kidney disease: Secondary | ICD-10-CM | POA: Diagnosis not present

## 2019-11-21 DIAGNOSIS — D509 Iron deficiency anemia, unspecified: Secondary | ICD-10-CM | POA: Diagnosis not present

## 2019-11-21 DIAGNOSIS — N186 End stage renal disease: Secondary | ICD-10-CM | POA: Diagnosis not present

## 2019-11-21 DIAGNOSIS — Z992 Dependence on renal dialysis: Secondary | ICD-10-CM | POA: Diagnosis not present

## 2019-11-21 DIAGNOSIS — Z79899 Other long term (current) drug therapy: Secondary | ICD-10-CM | POA: Diagnosis not present

## 2019-11-21 DIAGNOSIS — N2589 Other disorders resulting from impaired renal tubular function: Secondary | ICD-10-CM | POA: Diagnosis not present

## 2019-11-22 DIAGNOSIS — N2581 Secondary hyperparathyroidism of renal origin: Secondary | ICD-10-CM | POA: Diagnosis not present

## 2019-11-22 DIAGNOSIS — Z79899 Other long term (current) drug therapy: Secondary | ICD-10-CM | POA: Diagnosis not present

## 2019-11-22 DIAGNOSIS — N2589 Other disorders resulting from impaired renal tubular function: Secondary | ICD-10-CM | POA: Diagnosis not present

## 2019-11-22 DIAGNOSIS — Z992 Dependence on renal dialysis: Secondary | ICD-10-CM | POA: Diagnosis not present

## 2019-11-22 DIAGNOSIS — N186 End stage renal disease: Secondary | ICD-10-CM | POA: Diagnosis not present

## 2019-11-22 DIAGNOSIS — E440 Moderate protein-calorie malnutrition: Secondary | ICD-10-CM | POA: Diagnosis not present

## 2019-11-22 DIAGNOSIS — E7841 Elevated Lipoprotein(a): Secondary | ICD-10-CM | POA: Diagnosis not present

## 2019-11-22 DIAGNOSIS — D631 Anemia in chronic kidney disease: Secondary | ICD-10-CM | POA: Diagnosis not present

## 2019-11-22 DIAGNOSIS — D509 Iron deficiency anemia, unspecified: Secondary | ICD-10-CM | POA: Diagnosis not present

## 2019-11-23 DIAGNOSIS — D631 Anemia in chronic kidney disease: Secondary | ICD-10-CM | POA: Diagnosis not present

## 2019-11-23 DIAGNOSIS — N2581 Secondary hyperparathyroidism of renal origin: Secondary | ICD-10-CM | POA: Diagnosis not present

## 2019-11-23 DIAGNOSIS — N2589 Other disorders resulting from impaired renal tubular function: Secondary | ICD-10-CM | POA: Diagnosis not present

## 2019-11-23 DIAGNOSIS — Z79899 Other long term (current) drug therapy: Secondary | ICD-10-CM | POA: Diagnosis not present

## 2019-11-23 DIAGNOSIS — E440 Moderate protein-calorie malnutrition: Secondary | ICD-10-CM | POA: Diagnosis not present

## 2019-11-23 DIAGNOSIS — E7841 Elevated Lipoprotein(a): Secondary | ICD-10-CM | POA: Diagnosis not present

## 2019-11-23 DIAGNOSIS — N186 End stage renal disease: Secondary | ICD-10-CM | POA: Diagnosis not present

## 2019-11-23 DIAGNOSIS — D509 Iron deficiency anemia, unspecified: Secondary | ICD-10-CM | POA: Diagnosis not present

## 2019-11-23 DIAGNOSIS — Z992 Dependence on renal dialysis: Secondary | ICD-10-CM | POA: Diagnosis not present

## 2019-11-24 DIAGNOSIS — N2589 Other disorders resulting from impaired renal tubular function: Secondary | ICD-10-CM | POA: Diagnosis not present

## 2019-11-24 DIAGNOSIS — N186 End stage renal disease: Secondary | ICD-10-CM | POA: Diagnosis not present

## 2019-11-24 DIAGNOSIS — N2581 Secondary hyperparathyroidism of renal origin: Secondary | ICD-10-CM | POA: Diagnosis not present

## 2019-11-24 DIAGNOSIS — E440 Moderate protein-calorie malnutrition: Secondary | ICD-10-CM | POA: Diagnosis not present

## 2019-11-24 DIAGNOSIS — D631 Anemia in chronic kidney disease: Secondary | ICD-10-CM | POA: Diagnosis not present

## 2019-11-24 DIAGNOSIS — Z79899 Other long term (current) drug therapy: Secondary | ICD-10-CM | POA: Diagnosis not present

## 2019-11-24 DIAGNOSIS — E7841 Elevated Lipoprotein(a): Secondary | ICD-10-CM | POA: Diagnosis not present

## 2019-11-24 DIAGNOSIS — D509 Iron deficiency anemia, unspecified: Secondary | ICD-10-CM | POA: Diagnosis not present

## 2019-11-24 DIAGNOSIS — Z992 Dependence on renal dialysis: Secondary | ICD-10-CM | POA: Diagnosis not present

## 2019-11-25 DIAGNOSIS — D631 Anemia in chronic kidney disease: Secondary | ICD-10-CM | POA: Diagnosis not present

## 2019-11-25 DIAGNOSIS — D509 Iron deficiency anemia, unspecified: Secondary | ICD-10-CM | POA: Diagnosis not present

## 2019-11-25 DIAGNOSIS — Z79899 Other long term (current) drug therapy: Secondary | ICD-10-CM | POA: Diagnosis not present

## 2019-11-25 DIAGNOSIS — Z992 Dependence on renal dialysis: Secondary | ICD-10-CM | POA: Diagnosis not present

## 2019-11-25 DIAGNOSIS — N186 End stage renal disease: Secondary | ICD-10-CM | POA: Diagnosis not present

## 2019-11-25 DIAGNOSIS — E440 Moderate protein-calorie malnutrition: Secondary | ICD-10-CM | POA: Diagnosis not present

## 2019-11-25 DIAGNOSIS — N2581 Secondary hyperparathyroidism of renal origin: Secondary | ICD-10-CM | POA: Diagnosis not present

## 2019-11-25 DIAGNOSIS — N2589 Other disorders resulting from impaired renal tubular function: Secondary | ICD-10-CM | POA: Diagnosis not present

## 2019-11-25 DIAGNOSIS — E7841 Elevated Lipoprotein(a): Secondary | ICD-10-CM | POA: Diagnosis not present

## 2019-11-26 DIAGNOSIS — N2581 Secondary hyperparathyroidism of renal origin: Secondary | ICD-10-CM | POA: Diagnosis not present

## 2019-11-26 DIAGNOSIS — Z992 Dependence on renal dialysis: Secondary | ICD-10-CM | POA: Diagnosis not present

## 2019-11-26 DIAGNOSIS — D509 Iron deficiency anemia, unspecified: Secondary | ICD-10-CM | POA: Diagnosis not present

## 2019-11-26 DIAGNOSIS — N2589 Other disorders resulting from impaired renal tubular function: Secondary | ICD-10-CM | POA: Diagnosis not present

## 2019-11-26 DIAGNOSIS — E7841 Elevated Lipoprotein(a): Secondary | ICD-10-CM | POA: Diagnosis not present

## 2019-11-26 DIAGNOSIS — E440 Moderate protein-calorie malnutrition: Secondary | ICD-10-CM | POA: Diagnosis not present

## 2019-11-26 DIAGNOSIS — Z79899 Other long term (current) drug therapy: Secondary | ICD-10-CM | POA: Diagnosis not present

## 2019-11-26 DIAGNOSIS — N186 End stage renal disease: Secondary | ICD-10-CM | POA: Diagnosis not present

## 2019-11-26 DIAGNOSIS — D631 Anemia in chronic kidney disease: Secondary | ICD-10-CM | POA: Diagnosis not present

## 2019-11-27 DIAGNOSIS — N186 End stage renal disease: Secondary | ICD-10-CM | POA: Diagnosis not present

## 2019-11-27 DIAGNOSIS — N2589 Other disorders resulting from impaired renal tubular function: Secondary | ICD-10-CM | POA: Diagnosis not present

## 2019-11-27 DIAGNOSIS — Z79899 Other long term (current) drug therapy: Secondary | ICD-10-CM | POA: Diagnosis not present

## 2019-11-27 DIAGNOSIS — E7841 Elevated Lipoprotein(a): Secondary | ICD-10-CM | POA: Diagnosis not present

## 2019-11-27 DIAGNOSIS — E440 Moderate protein-calorie malnutrition: Secondary | ICD-10-CM | POA: Diagnosis not present

## 2019-11-27 DIAGNOSIS — D509 Iron deficiency anemia, unspecified: Secondary | ICD-10-CM | POA: Diagnosis not present

## 2019-11-27 DIAGNOSIS — N2581 Secondary hyperparathyroidism of renal origin: Secondary | ICD-10-CM | POA: Diagnosis not present

## 2019-11-27 DIAGNOSIS — Z992 Dependence on renal dialysis: Secondary | ICD-10-CM | POA: Diagnosis not present

## 2019-11-27 DIAGNOSIS — D631 Anemia in chronic kidney disease: Secondary | ICD-10-CM | POA: Diagnosis not present

## 2019-11-28 DIAGNOSIS — D509 Iron deficiency anemia, unspecified: Secondary | ICD-10-CM | POA: Diagnosis not present

## 2019-11-28 DIAGNOSIS — Z79899 Other long term (current) drug therapy: Secondary | ICD-10-CM | POA: Diagnosis not present

## 2019-11-28 DIAGNOSIS — E7841 Elevated Lipoprotein(a): Secondary | ICD-10-CM | POA: Diagnosis not present

## 2019-11-28 DIAGNOSIS — N2589 Other disorders resulting from impaired renal tubular function: Secondary | ICD-10-CM | POA: Diagnosis not present

## 2019-11-28 DIAGNOSIS — D631 Anemia in chronic kidney disease: Secondary | ICD-10-CM | POA: Diagnosis not present

## 2019-11-28 DIAGNOSIS — N186 End stage renal disease: Secondary | ICD-10-CM | POA: Diagnosis not present

## 2019-11-28 DIAGNOSIS — E440 Moderate protein-calorie malnutrition: Secondary | ICD-10-CM | POA: Diagnosis not present

## 2019-11-28 DIAGNOSIS — Z992 Dependence on renal dialysis: Secondary | ICD-10-CM | POA: Diagnosis not present

## 2019-11-28 DIAGNOSIS — N2581 Secondary hyperparathyroidism of renal origin: Secondary | ICD-10-CM | POA: Diagnosis not present

## 2019-11-29 DIAGNOSIS — D631 Anemia in chronic kidney disease: Secondary | ICD-10-CM | POA: Diagnosis not present

## 2019-11-29 DIAGNOSIS — N2581 Secondary hyperparathyroidism of renal origin: Secondary | ICD-10-CM | POA: Diagnosis not present

## 2019-11-29 DIAGNOSIS — Z79899 Other long term (current) drug therapy: Secondary | ICD-10-CM | POA: Diagnosis not present

## 2019-11-29 DIAGNOSIS — Z992 Dependence on renal dialysis: Secondary | ICD-10-CM | POA: Diagnosis not present

## 2019-11-29 DIAGNOSIS — N186 End stage renal disease: Secondary | ICD-10-CM | POA: Diagnosis not present

## 2019-11-29 DIAGNOSIS — D509 Iron deficiency anemia, unspecified: Secondary | ICD-10-CM | POA: Diagnosis not present

## 2019-11-29 DIAGNOSIS — E440 Moderate protein-calorie malnutrition: Secondary | ICD-10-CM | POA: Diagnosis not present

## 2019-11-29 DIAGNOSIS — E7841 Elevated Lipoprotein(a): Secondary | ICD-10-CM | POA: Diagnosis not present

## 2019-11-29 DIAGNOSIS — N2589 Other disorders resulting from impaired renal tubular function: Secondary | ICD-10-CM | POA: Diagnosis not present

## 2019-11-30 DIAGNOSIS — D509 Iron deficiency anemia, unspecified: Secondary | ICD-10-CM | POA: Diagnosis not present

## 2019-11-30 DIAGNOSIS — E440 Moderate protein-calorie malnutrition: Secondary | ICD-10-CM | POA: Diagnosis not present

## 2019-11-30 DIAGNOSIS — Z79899 Other long term (current) drug therapy: Secondary | ICD-10-CM | POA: Diagnosis not present

## 2019-11-30 DIAGNOSIS — D631 Anemia in chronic kidney disease: Secondary | ICD-10-CM | POA: Diagnosis not present

## 2019-11-30 DIAGNOSIS — E7841 Elevated Lipoprotein(a): Secondary | ICD-10-CM | POA: Diagnosis not present

## 2019-11-30 DIAGNOSIS — Z992 Dependence on renal dialysis: Secondary | ICD-10-CM | POA: Diagnosis not present

## 2019-11-30 DIAGNOSIS — N2581 Secondary hyperparathyroidism of renal origin: Secondary | ICD-10-CM | POA: Diagnosis not present

## 2019-11-30 DIAGNOSIS — N186 End stage renal disease: Secondary | ICD-10-CM | POA: Diagnosis not present

## 2019-11-30 DIAGNOSIS — N2589 Other disorders resulting from impaired renal tubular function: Secondary | ICD-10-CM | POA: Diagnosis not present

## 2019-12-01 DIAGNOSIS — E440 Moderate protein-calorie malnutrition: Secondary | ICD-10-CM | POA: Diagnosis not present

## 2019-12-01 DIAGNOSIS — E7841 Elevated Lipoprotein(a): Secondary | ICD-10-CM | POA: Diagnosis not present

## 2019-12-01 DIAGNOSIS — N2589 Other disorders resulting from impaired renal tubular function: Secondary | ICD-10-CM | POA: Diagnosis not present

## 2019-12-01 DIAGNOSIS — Z79899 Other long term (current) drug therapy: Secondary | ICD-10-CM | POA: Diagnosis not present

## 2019-12-01 DIAGNOSIS — D509 Iron deficiency anemia, unspecified: Secondary | ICD-10-CM | POA: Diagnosis not present

## 2019-12-01 DIAGNOSIS — N186 End stage renal disease: Secondary | ICD-10-CM | POA: Diagnosis not present

## 2019-12-01 DIAGNOSIS — N2581 Secondary hyperparathyroidism of renal origin: Secondary | ICD-10-CM | POA: Diagnosis not present

## 2019-12-01 DIAGNOSIS — D631 Anemia in chronic kidney disease: Secondary | ICD-10-CM | POA: Diagnosis not present

## 2019-12-01 DIAGNOSIS — Z992 Dependence on renal dialysis: Secondary | ICD-10-CM | POA: Diagnosis not present

## 2019-12-02 DIAGNOSIS — N2581 Secondary hyperparathyroidism of renal origin: Secondary | ICD-10-CM | POA: Diagnosis not present

## 2019-12-02 DIAGNOSIS — N186 End stage renal disease: Secondary | ICD-10-CM | POA: Diagnosis not present

## 2019-12-02 DIAGNOSIS — Z79899 Other long term (current) drug therapy: Secondary | ICD-10-CM | POA: Diagnosis not present

## 2019-12-02 DIAGNOSIS — N2589 Other disorders resulting from impaired renal tubular function: Secondary | ICD-10-CM | POA: Diagnosis not present

## 2019-12-02 DIAGNOSIS — D509 Iron deficiency anemia, unspecified: Secondary | ICD-10-CM | POA: Diagnosis not present

## 2019-12-02 DIAGNOSIS — D631 Anemia in chronic kidney disease: Secondary | ICD-10-CM | POA: Diagnosis not present

## 2019-12-02 DIAGNOSIS — Z992 Dependence on renal dialysis: Secondary | ICD-10-CM | POA: Diagnosis not present

## 2019-12-02 DIAGNOSIS — E7841 Elevated Lipoprotein(a): Secondary | ICD-10-CM | POA: Diagnosis not present

## 2019-12-02 DIAGNOSIS — E440 Moderate protein-calorie malnutrition: Secondary | ICD-10-CM | POA: Diagnosis not present

## 2019-12-03 DIAGNOSIS — D509 Iron deficiency anemia, unspecified: Secondary | ICD-10-CM | POA: Diagnosis not present

## 2019-12-03 DIAGNOSIS — D631 Anemia in chronic kidney disease: Secondary | ICD-10-CM | POA: Diagnosis not present

## 2019-12-03 DIAGNOSIS — N2581 Secondary hyperparathyroidism of renal origin: Secondary | ICD-10-CM | POA: Diagnosis not present

## 2019-12-03 DIAGNOSIS — Z79899 Other long term (current) drug therapy: Secondary | ICD-10-CM | POA: Diagnosis not present

## 2019-12-03 DIAGNOSIS — N2589 Other disorders resulting from impaired renal tubular function: Secondary | ICD-10-CM | POA: Diagnosis not present

## 2019-12-03 DIAGNOSIS — E7841 Elevated Lipoprotein(a): Secondary | ICD-10-CM | POA: Diagnosis not present

## 2019-12-03 DIAGNOSIS — E440 Moderate protein-calorie malnutrition: Secondary | ICD-10-CM | POA: Diagnosis not present

## 2019-12-03 DIAGNOSIS — N186 End stage renal disease: Secondary | ICD-10-CM | POA: Diagnosis not present

## 2019-12-03 DIAGNOSIS — Z992 Dependence on renal dialysis: Secondary | ICD-10-CM | POA: Diagnosis not present

## 2019-12-04 DIAGNOSIS — E7841 Elevated Lipoprotein(a): Secondary | ICD-10-CM | POA: Diagnosis not present

## 2019-12-04 DIAGNOSIS — E440 Moderate protein-calorie malnutrition: Secondary | ICD-10-CM | POA: Diagnosis not present

## 2019-12-04 DIAGNOSIS — N186 End stage renal disease: Secondary | ICD-10-CM | POA: Diagnosis not present

## 2019-12-04 DIAGNOSIS — Z79899 Other long term (current) drug therapy: Secondary | ICD-10-CM | POA: Diagnosis not present

## 2019-12-04 DIAGNOSIS — N2581 Secondary hyperparathyroidism of renal origin: Secondary | ICD-10-CM | POA: Diagnosis not present

## 2019-12-04 DIAGNOSIS — D631 Anemia in chronic kidney disease: Secondary | ICD-10-CM | POA: Diagnosis not present

## 2019-12-04 DIAGNOSIS — N2589 Other disorders resulting from impaired renal tubular function: Secondary | ICD-10-CM | POA: Diagnosis not present

## 2019-12-04 DIAGNOSIS — Z992 Dependence on renal dialysis: Secondary | ICD-10-CM | POA: Diagnosis not present

## 2019-12-04 DIAGNOSIS — D509 Iron deficiency anemia, unspecified: Secondary | ICD-10-CM | POA: Diagnosis not present

## 2019-12-05 DIAGNOSIS — Z79899 Other long term (current) drug therapy: Secondary | ICD-10-CM | POA: Diagnosis not present

## 2019-12-05 DIAGNOSIS — N2589 Other disorders resulting from impaired renal tubular function: Secondary | ICD-10-CM | POA: Diagnosis not present

## 2019-12-05 DIAGNOSIS — Z992 Dependence on renal dialysis: Secondary | ICD-10-CM | POA: Diagnosis not present

## 2019-12-05 DIAGNOSIS — N2581 Secondary hyperparathyroidism of renal origin: Secondary | ICD-10-CM | POA: Diagnosis not present

## 2019-12-05 DIAGNOSIS — E7841 Elevated Lipoprotein(a): Secondary | ICD-10-CM | POA: Diagnosis not present

## 2019-12-05 DIAGNOSIS — D631 Anemia in chronic kidney disease: Secondary | ICD-10-CM | POA: Diagnosis not present

## 2019-12-05 DIAGNOSIS — N186 End stage renal disease: Secondary | ICD-10-CM | POA: Diagnosis not present

## 2019-12-05 DIAGNOSIS — D509 Iron deficiency anemia, unspecified: Secondary | ICD-10-CM | POA: Diagnosis not present

## 2019-12-05 DIAGNOSIS — E440 Moderate protein-calorie malnutrition: Secondary | ICD-10-CM | POA: Diagnosis not present

## 2019-12-06 DIAGNOSIS — E7841 Elevated Lipoprotein(a): Secondary | ICD-10-CM | POA: Diagnosis not present

## 2019-12-06 DIAGNOSIS — N2589 Other disorders resulting from impaired renal tubular function: Secondary | ICD-10-CM | POA: Diagnosis not present

## 2019-12-06 DIAGNOSIS — Z992 Dependence on renal dialysis: Secondary | ICD-10-CM | POA: Diagnosis not present

## 2019-12-06 DIAGNOSIS — Z79899 Other long term (current) drug therapy: Secondary | ICD-10-CM | POA: Diagnosis not present

## 2019-12-06 DIAGNOSIS — D631 Anemia in chronic kidney disease: Secondary | ICD-10-CM | POA: Diagnosis not present

## 2019-12-06 DIAGNOSIS — E440 Moderate protein-calorie malnutrition: Secondary | ICD-10-CM | POA: Diagnosis not present

## 2019-12-06 DIAGNOSIS — N2581 Secondary hyperparathyroidism of renal origin: Secondary | ICD-10-CM | POA: Diagnosis not present

## 2019-12-06 DIAGNOSIS — I129 Hypertensive chronic kidney disease with stage 1 through stage 4 chronic kidney disease, or unspecified chronic kidney disease: Secondary | ICD-10-CM | POA: Diagnosis not present

## 2019-12-06 DIAGNOSIS — N186 End stage renal disease: Secondary | ICD-10-CM | POA: Diagnosis not present

## 2019-12-06 DIAGNOSIS — D509 Iron deficiency anemia, unspecified: Secondary | ICD-10-CM | POA: Diagnosis not present

## 2019-12-07 DIAGNOSIS — D631 Anemia in chronic kidney disease: Secondary | ICD-10-CM | POA: Diagnosis not present

## 2019-12-07 DIAGNOSIS — N186 End stage renal disease: Secondary | ICD-10-CM | POA: Diagnosis not present

## 2019-12-07 DIAGNOSIS — E440 Moderate protein-calorie malnutrition: Secondary | ICD-10-CM | POA: Diagnosis not present

## 2019-12-07 DIAGNOSIS — Z992 Dependence on renal dialysis: Secondary | ICD-10-CM | POA: Diagnosis not present

## 2019-12-07 DIAGNOSIS — N2581 Secondary hyperparathyroidism of renal origin: Secondary | ICD-10-CM | POA: Diagnosis not present

## 2019-12-07 DIAGNOSIS — D509 Iron deficiency anemia, unspecified: Secondary | ICD-10-CM | POA: Diagnosis not present

## 2019-12-07 DIAGNOSIS — Z79899 Other long term (current) drug therapy: Secondary | ICD-10-CM | POA: Diagnosis not present

## 2019-12-07 DIAGNOSIS — E7841 Elevated Lipoprotein(a): Secondary | ICD-10-CM | POA: Diagnosis not present

## 2019-12-07 DIAGNOSIS — N2589 Other disorders resulting from impaired renal tubular function: Secondary | ICD-10-CM | POA: Diagnosis not present

## 2019-12-08 DIAGNOSIS — D631 Anemia in chronic kidney disease: Secondary | ICD-10-CM | POA: Diagnosis not present

## 2019-12-08 DIAGNOSIS — D509 Iron deficiency anemia, unspecified: Secondary | ICD-10-CM | POA: Diagnosis not present

## 2019-12-08 DIAGNOSIS — N186 End stage renal disease: Secondary | ICD-10-CM | POA: Diagnosis not present

## 2019-12-08 DIAGNOSIS — Z79899 Other long term (current) drug therapy: Secondary | ICD-10-CM | POA: Diagnosis not present

## 2019-12-08 DIAGNOSIS — N2589 Other disorders resulting from impaired renal tubular function: Secondary | ICD-10-CM | POA: Diagnosis not present

## 2019-12-08 DIAGNOSIS — Z992 Dependence on renal dialysis: Secondary | ICD-10-CM | POA: Diagnosis not present

## 2019-12-08 DIAGNOSIS — E440 Moderate protein-calorie malnutrition: Secondary | ICD-10-CM | POA: Diagnosis not present

## 2019-12-08 DIAGNOSIS — E7841 Elevated Lipoprotein(a): Secondary | ICD-10-CM | POA: Diagnosis not present

## 2019-12-08 DIAGNOSIS — N2581 Secondary hyperparathyroidism of renal origin: Secondary | ICD-10-CM | POA: Diagnosis not present

## 2019-12-09 DIAGNOSIS — Z992 Dependence on renal dialysis: Secondary | ICD-10-CM | POA: Diagnosis not present

## 2019-12-09 DIAGNOSIS — D631 Anemia in chronic kidney disease: Secondary | ICD-10-CM | POA: Diagnosis not present

## 2019-12-09 DIAGNOSIS — D509 Iron deficiency anemia, unspecified: Secondary | ICD-10-CM | POA: Diagnosis not present

## 2019-12-09 DIAGNOSIS — N2581 Secondary hyperparathyroidism of renal origin: Secondary | ICD-10-CM | POA: Diagnosis not present

## 2019-12-09 DIAGNOSIS — E440 Moderate protein-calorie malnutrition: Secondary | ICD-10-CM | POA: Diagnosis not present

## 2019-12-09 DIAGNOSIS — N2589 Other disorders resulting from impaired renal tubular function: Secondary | ICD-10-CM | POA: Diagnosis not present

## 2019-12-09 DIAGNOSIS — N186 End stage renal disease: Secondary | ICD-10-CM | POA: Diagnosis not present

## 2019-12-09 DIAGNOSIS — Z79899 Other long term (current) drug therapy: Secondary | ICD-10-CM | POA: Diagnosis not present

## 2019-12-09 DIAGNOSIS — E7841 Elevated Lipoprotein(a): Secondary | ICD-10-CM | POA: Diagnosis not present

## 2019-12-10 DIAGNOSIS — N186 End stage renal disease: Secondary | ICD-10-CM | POA: Diagnosis not present

## 2019-12-10 DIAGNOSIS — Z79899 Other long term (current) drug therapy: Secondary | ICD-10-CM | POA: Diagnosis not present

## 2019-12-10 DIAGNOSIS — E440 Moderate protein-calorie malnutrition: Secondary | ICD-10-CM | POA: Diagnosis not present

## 2019-12-10 DIAGNOSIS — D631 Anemia in chronic kidney disease: Secondary | ICD-10-CM | POA: Diagnosis not present

## 2019-12-10 DIAGNOSIS — N2581 Secondary hyperparathyroidism of renal origin: Secondary | ICD-10-CM | POA: Diagnosis not present

## 2019-12-10 DIAGNOSIS — N2589 Other disorders resulting from impaired renal tubular function: Secondary | ICD-10-CM | POA: Diagnosis not present

## 2019-12-10 DIAGNOSIS — Z992 Dependence on renal dialysis: Secondary | ICD-10-CM | POA: Diagnosis not present

## 2019-12-10 DIAGNOSIS — E7841 Elevated Lipoprotein(a): Secondary | ICD-10-CM | POA: Diagnosis not present

## 2019-12-10 DIAGNOSIS — D509 Iron deficiency anemia, unspecified: Secondary | ICD-10-CM | POA: Diagnosis not present

## 2019-12-11 DIAGNOSIS — N186 End stage renal disease: Secondary | ICD-10-CM | POA: Diagnosis not present

## 2019-12-11 DIAGNOSIS — Z79899 Other long term (current) drug therapy: Secondary | ICD-10-CM | POA: Diagnosis not present

## 2019-12-11 DIAGNOSIS — N2589 Other disorders resulting from impaired renal tubular function: Secondary | ICD-10-CM | POA: Diagnosis not present

## 2019-12-11 DIAGNOSIS — E440 Moderate protein-calorie malnutrition: Secondary | ICD-10-CM | POA: Diagnosis not present

## 2019-12-11 DIAGNOSIS — D631 Anemia in chronic kidney disease: Secondary | ICD-10-CM | POA: Diagnosis not present

## 2019-12-11 DIAGNOSIS — D509 Iron deficiency anemia, unspecified: Secondary | ICD-10-CM | POA: Diagnosis not present

## 2019-12-11 DIAGNOSIS — N2581 Secondary hyperparathyroidism of renal origin: Secondary | ICD-10-CM | POA: Diagnosis not present

## 2019-12-11 DIAGNOSIS — Z992 Dependence on renal dialysis: Secondary | ICD-10-CM | POA: Diagnosis not present

## 2019-12-11 DIAGNOSIS — E7841 Elevated Lipoprotein(a): Secondary | ICD-10-CM | POA: Diagnosis not present

## 2019-12-12 DIAGNOSIS — Z79899 Other long term (current) drug therapy: Secondary | ICD-10-CM | POA: Diagnosis not present

## 2019-12-12 DIAGNOSIS — E440 Moderate protein-calorie malnutrition: Secondary | ICD-10-CM | POA: Diagnosis not present

## 2019-12-12 DIAGNOSIS — N2589 Other disorders resulting from impaired renal tubular function: Secondary | ICD-10-CM | POA: Diagnosis not present

## 2019-12-12 DIAGNOSIS — N186 End stage renal disease: Secondary | ICD-10-CM | POA: Diagnosis not present

## 2019-12-12 DIAGNOSIS — D631 Anemia in chronic kidney disease: Secondary | ICD-10-CM | POA: Diagnosis not present

## 2019-12-12 DIAGNOSIS — E7841 Elevated Lipoprotein(a): Secondary | ICD-10-CM | POA: Diagnosis not present

## 2019-12-12 DIAGNOSIS — N2581 Secondary hyperparathyroidism of renal origin: Secondary | ICD-10-CM | POA: Diagnosis not present

## 2019-12-12 DIAGNOSIS — D509 Iron deficiency anemia, unspecified: Secondary | ICD-10-CM | POA: Diagnosis not present

## 2019-12-12 DIAGNOSIS — Z992 Dependence on renal dialysis: Secondary | ICD-10-CM | POA: Diagnosis not present

## 2019-12-13 DIAGNOSIS — D631 Anemia in chronic kidney disease: Secondary | ICD-10-CM | POA: Diagnosis not present

## 2019-12-13 DIAGNOSIS — Z79899 Other long term (current) drug therapy: Secondary | ICD-10-CM | POA: Diagnosis not present

## 2019-12-13 DIAGNOSIS — N2589 Other disorders resulting from impaired renal tubular function: Secondary | ICD-10-CM | POA: Diagnosis not present

## 2019-12-13 DIAGNOSIS — N2581 Secondary hyperparathyroidism of renal origin: Secondary | ICD-10-CM | POA: Diagnosis not present

## 2019-12-13 DIAGNOSIS — Z992 Dependence on renal dialysis: Secondary | ICD-10-CM | POA: Diagnosis not present

## 2019-12-13 DIAGNOSIS — E7841 Elevated Lipoprotein(a): Secondary | ICD-10-CM | POA: Diagnosis not present

## 2019-12-13 DIAGNOSIS — D509 Iron deficiency anemia, unspecified: Secondary | ICD-10-CM | POA: Diagnosis not present

## 2019-12-13 DIAGNOSIS — E440 Moderate protein-calorie malnutrition: Secondary | ICD-10-CM | POA: Diagnosis not present

## 2019-12-13 DIAGNOSIS — N186 End stage renal disease: Secondary | ICD-10-CM | POA: Diagnosis not present

## 2019-12-14 DIAGNOSIS — Z79899 Other long term (current) drug therapy: Secondary | ICD-10-CM | POA: Diagnosis not present

## 2019-12-14 DIAGNOSIS — N2589 Other disorders resulting from impaired renal tubular function: Secondary | ICD-10-CM | POA: Diagnosis not present

## 2019-12-14 DIAGNOSIS — N186 End stage renal disease: Secondary | ICD-10-CM | POA: Diagnosis not present

## 2019-12-14 DIAGNOSIS — Z992 Dependence on renal dialysis: Secondary | ICD-10-CM | POA: Diagnosis not present

## 2019-12-14 DIAGNOSIS — E440 Moderate protein-calorie malnutrition: Secondary | ICD-10-CM | POA: Diagnosis not present

## 2019-12-14 DIAGNOSIS — N2581 Secondary hyperparathyroidism of renal origin: Secondary | ICD-10-CM | POA: Diagnosis not present

## 2019-12-14 DIAGNOSIS — D631 Anemia in chronic kidney disease: Secondary | ICD-10-CM | POA: Diagnosis not present

## 2019-12-14 DIAGNOSIS — D509 Iron deficiency anemia, unspecified: Secondary | ICD-10-CM | POA: Diagnosis not present

## 2019-12-14 DIAGNOSIS — E7841 Elevated Lipoprotein(a): Secondary | ICD-10-CM | POA: Diagnosis not present

## 2019-12-15 DIAGNOSIS — E7841 Elevated Lipoprotein(a): Secondary | ICD-10-CM | POA: Diagnosis not present

## 2019-12-15 DIAGNOSIS — Z79899 Other long term (current) drug therapy: Secondary | ICD-10-CM | POA: Diagnosis not present

## 2019-12-15 DIAGNOSIS — N2589 Other disorders resulting from impaired renal tubular function: Secondary | ICD-10-CM | POA: Diagnosis not present

## 2019-12-15 DIAGNOSIS — N186 End stage renal disease: Secondary | ICD-10-CM | POA: Diagnosis not present

## 2019-12-15 DIAGNOSIS — Z992 Dependence on renal dialysis: Secondary | ICD-10-CM | POA: Diagnosis not present

## 2019-12-15 DIAGNOSIS — D509 Iron deficiency anemia, unspecified: Secondary | ICD-10-CM | POA: Diagnosis not present

## 2019-12-15 DIAGNOSIS — D631 Anemia in chronic kidney disease: Secondary | ICD-10-CM | POA: Diagnosis not present

## 2019-12-15 DIAGNOSIS — N2581 Secondary hyperparathyroidism of renal origin: Secondary | ICD-10-CM | POA: Diagnosis not present

## 2019-12-15 DIAGNOSIS — E440 Moderate protein-calorie malnutrition: Secondary | ICD-10-CM | POA: Diagnosis not present

## 2019-12-16 DIAGNOSIS — Z79899 Other long term (current) drug therapy: Secondary | ICD-10-CM | POA: Diagnosis not present

## 2019-12-16 DIAGNOSIS — N186 End stage renal disease: Secondary | ICD-10-CM | POA: Diagnosis not present

## 2019-12-16 DIAGNOSIS — D509 Iron deficiency anemia, unspecified: Secondary | ICD-10-CM | POA: Diagnosis not present

## 2019-12-16 DIAGNOSIS — Z992 Dependence on renal dialysis: Secondary | ICD-10-CM | POA: Diagnosis not present

## 2019-12-16 DIAGNOSIS — N2581 Secondary hyperparathyroidism of renal origin: Secondary | ICD-10-CM | POA: Diagnosis not present

## 2019-12-16 DIAGNOSIS — N2589 Other disorders resulting from impaired renal tubular function: Secondary | ICD-10-CM | POA: Diagnosis not present

## 2019-12-16 DIAGNOSIS — D631 Anemia in chronic kidney disease: Secondary | ICD-10-CM | POA: Diagnosis not present

## 2019-12-16 DIAGNOSIS — E440 Moderate protein-calorie malnutrition: Secondary | ICD-10-CM | POA: Diagnosis not present

## 2019-12-16 DIAGNOSIS — E7841 Elevated Lipoprotein(a): Secondary | ICD-10-CM | POA: Diagnosis not present

## 2019-12-17 DIAGNOSIS — N186 End stage renal disease: Secondary | ICD-10-CM | POA: Diagnosis not present

## 2019-12-17 DIAGNOSIS — E7841 Elevated Lipoprotein(a): Secondary | ICD-10-CM | POA: Diagnosis not present

## 2019-12-17 DIAGNOSIS — Z79899 Other long term (current) drug therapy: Secondary | ICD-10-CM | POA: Diagnosis not present

## 2019-12-17 DIAGNOSIS — Z992 Dependence on renal dialysis: Secondary | ICD-10-CM | POA: Diagnosis not present

## 2019-12-17 DIAGNOSIS — N2581 Secondary hyperparathyroidism of renal origin: Secondary | ICD-10-CM | POA: Diagnosis not present

## 2019-12-17 DIAGNOSIS — E440 Moderate protein-calorie malnutrition: Secondary | ICD-10-CM | POA: Diagnosis not present

## 2019-12-17 DIAGNOSIS — D509 Iron deficiency anemia, unspecified: Secondary | ICD-10-CM | POA: Diagnosis not present

## 2019-12-17 DIAGNOSIS — N2589 Other disorders resulting from impaired renal tubular function: Secondary | ICD-10-CM | POA: Diagnosis not present

## 2019-12-17 DIAGNOSIS — D631 Anemia in chronic kidney disease: Secondary | ICD-10-CM | POA: Diagnosis not present

## 2019-12-18 DIAGNOSIS — N2589 Other disorders resulting from impaired renal tubular function: Secondary | ICD-10-CM | POA: Diagnosis not present

## 2019-12-18 DIAGNOSIS — N186 End stage renal disease: Secondary | ICD-10-CM | POA: Diagnosis not present

## 2019-12-18 DIAGNOSIS — Z79899 Other long term (current) drug therapy: Secondary | ICD-10-CM | POA: Diagnosis not present

## 2019-12-18 DIAGNOSIS — Z992 Dependence on renal dialysis: Secondary | ICD-10-CM | POA: Diagnosis not present

## 2019-12-18 DIAGNOSIS — N2581 Secondary hyperparathyroidism of renal origin: Secondary | ICD-10-CM | POA: Diagnosis not present

## 2019-12-18 DIAGNOSIS — E440 Moderate protein-calorie malnutrition: Secondary | ICD-10-CM | POA: Diagnosis not present

## 2019-12-18 DIAGNOSIS — D631 Anemia in chronic kidney disease: Secondary | ICD-10-CM | POA: Diagnosis not present

## 2019-12-18 DIAGNOSIS — E7841 Elevated Lipoprotein(a): Secondary | ICD-10-CM | POA: Diagnosis not present

## 2019-12-18 DIAGNOSIS — D509 Iron deficiency anemia, unspecified: Secondary | ICD-10-CM | POA: Diagnosis not present

## 2019-12-19 DIAGNOSIS — Z79899 Other long term (current) drug therapy: Secondary | ICD-10-CM | POA: Diagnosis not present

## 2019-12-19 DIAGNOSIS — N186 End stage renal disease: Secondary | ICD-10-CM | POA: Diagnosis not present

## 2019-12-19 DIAGNOSIS — D509 Iron deficiency anemia, unspecified: Secondary | ICD-10-CM | POA: Diagnosis not present

## 2019-12-19 DIAGNOSIS — D631 Anemia in chronic kidney disease: Secondary | ICD-10-CM | POA: Diagnosis not present

## 2019-12-19 DIAGNOSIS — E440 Moderate protein-calorie malnutrition: Secondary | ICD-10-CM | POA: Diagnosis not present

## 2019-12-19 DIAGNOSIS — N2589 Other disorders resulting from impaired renal tubular function: Secondary | ICD-10-CM | POA: Diagnosis not present

## 2019-12-19 DIAGNOSIS — E7841 Elevated Lipoprotein(a): Secondary | ICD-10-CM | POA: Diagnosis not present

## 2019-12-19 DIAGNOSIS — N2581 Secondary hyperparathyroidism of renal origin: Secondary | ICD-10-CM | POA: Diagnosis not present

## 2019-12-19 DIAGNOSIS — Z992 Dependence on renal dialysis: Secondary | ICD-10-CM | POA: Diagnosis not present

## 2019-12-20 DIAGNOSIS — N2581 Secondary hyperparathyroidism of renal origin: Secondary | ICD-10-CM | POA: Diagnosis not present

## 2019-12-20 DIAGNOSIS — N2589 Other disorders resulting from impaired renal tubular function: Secondary | ICD-10-CM | POA: Diagnosis not present

## 2019-12-20 DIAGNOSIS — Z992 Dependence on renal dialysis: Secondary | ICD-10-CM | POA: Diagnosis not present

## 2019-12-20 DIAGNOSIS — Z79899 Other long term (current) drug therapy: Secondary | ICD-10-CM | POA: Diagnosis not present

## 2019-12-20 DIAGNOSIS — E440 Moderate protein-calorie malnutrition: Secondary | ICD-10-CM | POA: Diagnosis not present

## 2019-12-20 DIAGNOSIS — E7841 Elevated Lipoprotein(a): Secondary | ICD-10-CM | POA: Diagnosis not present

## 2019-12-20 DIAGNOSIS — D509 Iron deficiency anemia, unspecified: Secondary | ICD-10-CM | POA: Diagnosis not present

## 2019-12-20 DIAGNOSIS — N186 End stage renal disease: Secondary | ICD-10-CM | POA: Diagnosis not present

## 2019-12-20 DIAGNOSIS — D631 Anemia in chronic kidney disease: Secondary | ICD-10-CM | POA: Diagnosis not present

## 2019-12-21 DIAGNOSIS — D509 Iron deficiency anemia, unspecified: Secondary | ICD-10-CM | POA: Diagnosis not present

## 2019-12-21 DIAGNOSIS — N2589 Other disorders resulting from impaired renal tubular function: Secondary | ICD-10-CM | POA: Diagnosis not present

## 2019-12-21 DIAGNOSIS — Z992 Dependence on renal dialysis: Secondary | ICD-10-CM | POA: Diagnosis not present

## 2019-12-21 DIAGNOSIS — E7841 Elevated Lipoprotein(a): Secondary | ICD-10-CM | POA: Diagnosis not present

## 2019-12-21 DIAGNOSIS — E440 Moderate protein-calorie malnutrition: Secondary | ICD-10-CM | POA: Diagnosis not present

## 2019-12-21 DIAGNOSIS — D631 Anemia in chronic kidney disease: Secondary | ICD-10-CM | POA: Diagnosis not present

## 2019-12-21 DIAGNOSIS — N186 End stage renal disease: Secondary | ICD-10-CM | POA: Diagnosis not present

## 2019-12-21 DIAGNOSIS — Z79899 Other long term (current) drug therapy: Secondary | ICD-10-CM | POA: Diagnosis not present

## 2019-12-21 DIAGNOSIS — N2581 Secondary hyperparathyroidism of renal origin: Secondary | ICD-10-CM | POA: Diagnosis not present

## 2019-12-22 DIAGNOSIS — E440 Moderate protein-calorie malnutrition: Secondary | ICD-10-CM | POA: Diagnosis not present

## 2019-12-22 DIAGNOSIS — E7841 Elevated Lipoprotein(a): Secondary | ICD-10-CM | POA: Diagnosis not present

## 2019-12-22 DIAGNOSIS — N2589 Other disorders resulting from impaired renal tubular function: Secondary | ICD-10-CM | POA: Diagnosis not present

## 2019-12-22 DIAGNOSIS — N186 End stage renal disease: Secondary | ICD-10-CM | POA: Diagnosis not present

## 2019-12-22 DIAGNOSIS — D631 Anemia in chronic kidney disease: Secondary | ICD-10-CM | POA: Diagnosis not present

## 2019-12-22 DIAGNOSIS — Z992 Dependence on renal dialysis: Secondary | ICD-10-CM | POA: Diagnosis not present

## 2019-12-22 DIAGNOSIS — Z79899 Other long term (current) drug therapy: Secondary | ICD-10-CM | POA: Diagnosis not present

## 2019-12-22 DIAGNOSIS — D509 Iron deficiency anemia, unspecified: Secondary | ICD-10-CM | POA: Diagnosis not present

## 2019-12-22 DIAGNOSIS — N2581 Secondary hyperparathyroidism of renal origin: Secondary | ICD-10-CM | POA: Diagnosis not present

## 2019-12-23 DIAGNOSIS — D509 Iron deficiency anemia, unspecified: Secondary | ICD-10-CM | POA: Diagnosis not present

## 2019-12-23 DIAGNOSIS — Z79899 Other long term (current) drug therapy: Secondary | ICD-10-CM | POA: Diagnosis not present

## 2019-12-23 DIAGNOSIS — D631 Anemia in chronic kidney disease: Secondary | ICD-10-CM | POA: Diagnosis not present

## 2019-12-23 DIAGNOSIS — Z992 Dependence on renal dialysis: Secondary | ICD-10-CM | POA: Diagnosis not present

## 2019-12-23 DIAGNOSIS — N2589 Other disorders resulting from impaired renal tubular function: Secondary | ICD-10-CM | POA: Diagnosis not present

## 2019-12-23 DIAGNOSIS — E7841 Elevated Lipoprotein(a): Secondary | ICD-10-CM | POA: Diagnosis not present

## 2019-12-23 DIAGNOSIS — N186 End stage renal disease: Secondary | ICD-10-CM | POA: Diagnosis not present

## 2019-12-23 DIAGNOSIS — E440 Moderate protein-calorie malnutrition: Secondary | ICD-10-CM | POA: Diagnosis not present

## 2019-12-23 DIAGNOSIS — N2581 Secondary hyperparathyroidism of renal origin: Secondary | ICD-10-CM | POA: Diagnosis not present

## 2019-12-24 DIAGNOSIS — N2581 Secondary hyperparathyroidism of renal origin: Secondary | ICD-10-CM | POA: Diagnosis not present

## 2019-12-24 DIAGNOSIS — D631 Anemia in chronic kidney disease: Secondary | ICD-10-CM | POA: Diagnosis not present

## 2019-12-24 DIAGNOSIS — N2589 Other disorders resulting from impaired renal tubular function: Secondary | ICD-10-CM | POA: Diagnosis not present

## 2019-12-24 DIAGNOSIS — E7841 Elevated Lipoprotein(a): Secondary | ICD-10-CM | POA: Diagnosis not present

## 2019-12-24 DIAGNOSIS — Z992 Dependence on renal dialysis: Secondary | ICD-10-CM | POA: Diagnosis not present

## 2019-12-24 DIAGNOSIS — N186 End stage renal disease: Secondary | ICD-10-CM | POA: Diagnosis not present

## 2019-12-24 DIAGNOSIS — D509 Iron deficiency anemia, unspecified: Secondary | ICD-10-CM | POA: Diagnosis not present

## 2019-12-24 DIAGNOSIS — Z79899 Other long term (current) drug therapy: Secondary | ICD-10-CM | POA: Diagnosis not present

## 2019-12-24 DIAGNOSIS — E440 Moderate protein-calorie malnutrition: Secondary | ICD-10-CM | POA: Diagnosis not present

## 2019-12-25 DIAGNOSIS — D509 Iron deficiency anemia, unspecified: Secondary | ICD-10-CM | POA: Diagnosis not present

## 2019-12-25 DIAGNOSIS — E7841 Elevated Lipoprotein(a): Secondary | ICD-10-CM | POA: Diagnosis not present

## 2019-12-25 DIAGNOSIS — D631 Anemia in chronic kidney disease: Secondary | ICD-10-CM | POA: Diagnosis not present

## 2019-12-25 DIAGNOSIS — N186 End stage renal disease: Secondary | ICD-10-CM | POA: Diagnosis not present

## 2019-12-25 DIAGNOSIS — N2589 Other disorders resulting from impaired renal tubular function: Secondary | ICD-10-CM | POA: Diagnosis not present

## 2019-12-25 DIAGNOSIS — E440 Moderate protein-calorie malnutrition: Secondary | ICD-10-CM | POA: Diagnosis not present

## 2019-12-25 DIAGNOSIS — Z992 Dependence on renal dialysis: Secondary | ICD-10-CM | POA: Diagnosis not present

## 2019-12-25 DIAGNOSIS — Z79899 Other long term (current) drug therapy: Secondary | ICD-10-CM | POA: Diagnosis not present

## 2019-12-25 DIAGNOSIS — N2581 Secondary hyperparathyroidism of renal origin: Secondary | ICD-10-CM | POA: Diagnosis not present

## 2019-12-26 DIAGNOSIS — E440 Moderate protein-calorie malnutrition: Secondary | ICD-10-CM | POA: Diagnosis not present

## 2019-12-26 DIAGNOSIS — N186 End stage renal disease: Secondary | ICD-10-CM | POA: Diagnosis not present

## 2019-12-26 DIAGNOSIS — D631 Anemia in chronic kidney disease: Secondary | ICD-10-CM | POA: Diagnosis not present

## 2019-12-26 DIAGNOSIS — N2589 Other disorders resulting from impaired renal tubular function: Secondary | ICD-10-CM | POA: Diagnosis not present

## 2019-12-26 DIAGNOSIS — N2581 Secondary hyperparathyroidism of renal origin: Secondary | ICD-10-CM | POA: Diagnosis not present

## 2019-12-26 DIAGNOSIS — D509 Iron deficiency anemia, unspecified: Secondary | ICD-10-CM | POA: Diagnosis not present

## 2019-12-26 DIAGNOSIS — Z79899 Other long term (current) drug therapy: Secondary | ICD-10-CM | POA: Diagnosis not present

## 2019-12-26 DIAGNOSIS — Z992 Dependence on renal dialysis: Secondary | ICD-10-CM | POA: Diagnosis not present

## 2019-12-26 DIAGNOSIS — E7841 Elevated Lipoprotein(a): Secondary | ICD-10-CM | POA: Diagnosis not present

## 2019-12-27 DIAGNOSIS — N2581 Secondary hyperparathyroidism of renal origin: Secondary | ICD-10-CM | POA: Diagnosis not present

## 2019-12-27 DIAGNOSIS — N186 End stage renal disease: Secondary | ICD-10-CM | POA: Diagnosis not present

## 2019-12-27 DIAGNOSIS — E7841 Elevated Lipoprotein(a): Secondary | ICD-10-CM | POA: Diagnosis not present

## 2019-12-27 DIAGNOSIS — E440 Moderate protein-calorie malnutrition: Secondary | ICD-10-CM | POA: Diagnosis not present

## 2019-12-27 DIAGNOSIS — N2589 Other disorders resulting from impaired renal tubular function: Secondary | ICD-10-CM | POA: Diagnosis not present

## 2019-12-27 DIAGNOSIS — D509 Iron deficiency anemia, unspecified: Secondary | ICD-10-CM | POA: Diagnosis not present

## 2019-12-27 DIAGNOSIS — D631 Anemia in chronic kidney disease: Secondary | ICD-10-CM | POA: Diagnosis not present

## 2019-12-27 DIAGNOSIS — Z992 Dependence on renal dialysis: Secondary | ICD-10-CM | POA: Diagnosis not present

## 2019-12-27 DIAGNOSIS — Z79899 Other long term (current) drug therapy: Secondary | ICD-10-CM | POA: Diagnosis not present

## 2019-12-28 DIAGNOSIS — E7841 Elevated Lipoprotein(a): Secondary | ICD-10-CM | POA: Diagnosis not present

## 2019-12-28 DIAGNOSIS — N186 End stage renal disease: Secondary | ICD-10-CM | POA: Diagnosis not present

## 2019-12-28 DIAGNOSIS — Z79899 Other long term (current) drug therapy: Secondary | ICD-10-CM | POA: Diagnosis not present

## 2019-12-28 DIAGNOSIS — N2581 Secondary hyperparathyroidism of renal origin: Secondary | ICD-10-CM | POA: Diagnosis not present

## 2019-12-28 DIAGNOSIS — Z992 Dependence on renal dialysis: Secondary | ICD-10-CM | POA: Diagnosis not present

## 2019-12-28 DIAGNOSIS — E440 Moderate protein-calorie malnutrition: Secondary | ICD-10-CM | POA: Diagnosis not present

## 2019-12-28 DIAGNOSIS — D509 Iron deficiency anemia, unspecified: Secondary | ICD-10-CM | POA: Diagnosis not present

## 2019-12-28 DIAGNOSIS — D631 Anemia in chronic kidney disease: Secondary | ICD-10-CM | POA: Diagnosis not present

## 2019-12-28 DIAGNOSIS — N2589 Other disorders resulting from impaired renal tubular function: Secondary | ICD-10-CM | POA: Diagnosis not present

## 2019-12-29 DIAGNOSIS — N289 Disorder of kidney and ureter, unspecified: Secondary | ICD-10-CM | POA: Diagnosis not present

## 2019-12-29 DIAGNOSIS — Z7682 Awaiting organ transplant status: Secondary | ICD-10-CM | POA: Diagnosis not present

## 2019-12-29 DIAGNOSIS — Z01818 Encounter for other preprocedural examination: Secondary | ICD-10-CM | POA: Diagnosis not present

## 2019-12-29 DIAGNOSIS — Z7289 Other problems related to lifestyle: Secondary | ICD-10-CM | POA: Diagnosis not present

## 2019-12-29 DIAGNOSIS — D509 Iron deficiency anemia, unspecified: Secondary | ICD-10-CM | POA: Diagnosis not present

## 2019-12-29 DIAGNOSIS — Z79899 Other long term (current) drug therapy: Secondary | ICD-10-CM | POA: Diagnosis not present

## 2019-12-29 DIAGNOSIS — D631 Anemia in chronic kidney disease: Secondary | ICD-10-CM | POA: Diagnosis not present

## 2019-12-29 DIAGNOSIS — F17200 Nicotine dependence, unspecified, uncomplicated: Secondary | ICD-10-CM | POA: Diagnosis not present

## 2019-12-29 DIAGNOSIS — I708 Atherosclerosis of other arteries: Secondary | ICD-10-CM | POA: Diagnosis not present

## 2019-12-29 DIAGNOSIS — I120 Hypertensive chronic kidney disease with stage 5 chronic kidney disease or end stage renal disease: Secondary | ICD-10-CM | POA: Diagnosis not present

## 2019-12-29 DIAGNOSIS — F17210 Nicotine dependence, cigarettes, uncomplicated: Secondary | ICD-10-CM | POA: Diagnosis not present

## 2019-12-29 DIAGNOSIS — E7841 Elevated Lipoprotein(a): Secondary | ICD-10-CM | POA: Diagnosis not present

## 2019-12-29 DIAGNOSIS — E440 Moderate protein-calorie malnutrition: Secondary | ICD-10-CM | POA: Diagnosis not present

## 2019-12-29 DIAGNOSIS — I700 Atherosclerosis of aorta: Secondary | ICD-10-CM | POA: Diagnosis not present

## 2019-12-29 DIAGNOSIS — N186 End stage renal disease: Secondary | ICD-10-CM | POA: Diagnosis not present

## 2019-12-29 DIAGNOSIS — Z992 Dependence on renal dialysis: Secondary | ICD-10-CM | POA: Diagnosis not present

## 2019-12-29 DIAGNOSIS — N2581 Secondary hyperparathyroidism of renal origin: Secondary | ICD-10-CM | POA: Diagnosis not present

## 2019-12-29 DIAGNOSIS — N3289 Other specified disorders of bladder: Secondary | ICD-10-CM | POA: Diagnosis not present

## 2019-12-29 DIAGNOSIS — N2589 Other disorders resulting from impaired renal tubular function: Secondary | ICD-10-CM | POA: Diagnosis not present

## 2019-12-29 DIAGNOSIS — Z1159 Encounter for screening for other viral diseases: Secondary | ICD-10-CM | POA: Diagnosis not present

## 2019-12-29 DIAGNOSIS — N261 Atrophy of kidney (terminal): Secondary | ICD-10-CM | POA: Diagnosis not present

## 2019-12-30 DIAGNOSIS — D509 Iron deficiency anemia, unspecified: Secondary | ICD-10-CM | POA: Diagnosis not present

## 2019-12-30 DIAGNOSIS — Z79899 Other long term (current) drug therapy: Secondary | ICD-10-CM | POA: Diagnosis not present

## 2019-12-30 DIAGNOSIS — D631 Anemia in chronic kidney disease: Secondary | ICD-10-CM | POA: Diagnosis not present

## 2019-12-30 DIAGNOSIS — N2589 Other disorders resulting from impaired renal tubular function: Secondary | ICD-10-CM | POA: Diagnosis not present

## 2019-12-30 DIAGNOSIS — E440 Moderate protein-calorie malnutrition: Secondary | ICD-10-CM | POA: Diagnosis not present

## 2019-12-30 DIAGNOSIS — E7841 Elevated Lipoprotein(a): Secondary | ICD-10-CM | POA: Diagnosis not present

## 2019-12-30 DIAGNOSIS — N2581 Secondary hyperparathyroidism of renal origin: Secondary | ICD-10-CM | POA: Diagnosis not present

## 2019-12-30 DIAGNOSIS — Z992 Dependence on renal dialysis: Secondary | ICD-10-CM | POA: Diagnosis not present

## 2019-12-30 DIAGNOSIS — N186 End stage renal disease: Secondary | ICD-10-CM | POA: Diagnosis not present

## 2019-12-31 DIAGNOSIS — D631 Anemia in chronic kidney disease: Secondary | ICD-10-CM | POA: Diagnosis not present

## 2019-12-31 DIAGNOSIS — Z79899 Other long term (current) drug therapy: Secondary | ICD-10-CM | POA: Diagnosis not present

## 2019-12-31 DIAGNOSIS — Z992 Dependence on renal dialysis: Secondary | ICD-10-CM | POA: Diagnosis not present

## 2019-12-31 DIAGNOSIS — D509 Iron deficiency anemia, unspecified: Secondary | ICD-10-CM | POA: Diagnosis not present

## 2019-12-31 DIAGNOSIS — E440 Moderate protein-calorie malnutrition: Secondary | ICD-10-CM | POA: Diagnosis not present

## 2019-12-31 DIAGNOSIS — N2581 Secondary hyperparathyroidism of renal origin: Secondary | ICD-10-CM | POA: Diagnosis not present

## 2019-12-31 DIAGNOSIS — E7841 Elevated Lipoprotein(a): Secondary | ICD-10-CM | POA: Diagnosis not present

## 2019-12-31 DIAGNOSIS — N2589 Other disorders resulting from impaired renal tubular function: Secondary | ICD-10-CM | POA: Diagnosis not present

## 2019-12-31 DIAGNOSIS — N186 End stage renal disease: Secondary | ICD-10-CM | POA: Diagnosis not present

## 2020-01-01 DIAGNOSIS — Z79899 Other long term (current) drug therapy: Secondary | ICD-10-CM | POA: Diagnosis not present

## 2020-01-01 DIAGNOSIS — N2589 Other disorders resulting from impaired renal tubular function: Secondary | ICD-10-CM | POA: Diagnosis not present

## 2020-01-01 DIAGNOSIS — D509 Iron deficiency anemia, unspecified: Secondary | ICD-10-CM | POA: Diagnosis not present

## 2020-01-01 DIAGNOSIS — N186 End stage renal disease: Secondary | ICD-10-CM | POA: Diagnosis not present

## 2020-01-01 DIAGNOSIS — E440 Moderate protein-calorie malnutrition: Secondary | ICD-10-CM | POA: Diagnosis not present

## 2020-01-01 DIAGNOSIS — Z992 Dependence on renal dialysis: Secondary | ICD-10-CM | POA: Diagnosis not present

## 2020-01-01 DIAGNOSIS — E7841 Elevated Lipoprotein(a): Secondary | ICD-10-CM | POA: Diagnosis not present

## 2020-01-01 DIAGNOSIS — N2581 Secondary hyperparathyroidism of renal origin: Secondary | ICD-10-CM | POA: Diagnosis not present

## 2020-01-01 DIAGNOSIS — D631 Anemia in chronic kidney disease: Secondary | ICD-10-CM | POA: Diagnosis not present

## 2020-01-02 DIAGNOSIS — E7841 Elevated Lipoprotein(a): Secondary | ICD-10-CM | POA: Diagnosis not present

## 2020-01-02 DIAGNOSIS — Z79899 Other long term (current) drug therapy: Secondary | ICD-10-CM | POA: Diagnosis not present

## 2020-01-02 DIAGNOSIS — E440 Moderate protein-calorie malnutrition: Secondary | ICD-10-CM | POA: Diagnosis not present

## 2020-01-02 DIAGNOSIS — Z992 Dependence on renal dialysis: Secondary | ICD-10-CM | POA: Diagnosis not present

## 2020-01-02 DIAGNOSIS — D631 Anemia in chronic kidney disease: Secondary | ICD-10-CM | POA: Diagnosis not present

## 2020-01-02 DIAGNOSIS — N2589 Other disorders resulting from impaired renal tubular function: Secondary | ICD-10-CM | POA: Diagnosis not present

## 2020-01-02 DIAGNOSIS — N186 End stage renal disease: Secondary | ICD-10-CM | POA: Diagnosis not present

## 2020-01-02 DIAGNOSIS — D509 Iron deficiency anemia, unspecified: Secondary | ICD-10-CM | POA: Diagnosis not present

## 2020-01-02 DIAGNOSIS — N2581 Secondary hyperparathyroidism of renal origin: Secondary | ICD-10-CM | POA: Diagnosis not present

## 2020-01-03 ENCOUNTER — Encounter (HOSPITAL_COMMUNITY): Payer: Self-pay

## 2020-01-03 ENCOUNTER — Ambulatory Visit (HOSPITAL_COMMUNITY)
Admission: EM | Admit: 2020-01-03 | Discharge: 2020-01-03 | Disposition: A | Discharge status: Home / Self Care | Payer: Medicare Other | Attending: Urgent Care | Admitting: Urgent Care

## 2020-01-03 ENCOUNTER — Other Ambulatory Visit: Payer: Self-pay

## 2020-01-03 DIAGNOSIS — J069 Acute upper respiratory infection, unspecified: Secondary | ICD-10-CM | POA: Diagnosis not present

## 2020-01-03 DIAGNOSIS — E7841 Elevated Lipoprotein(a): Secondary | ICD-10-CM | POA: Diagnosis not present

## 2020-01-03 DIAGNOSIS — E440 Moderate protein-calorie malnutrition: Secondary | ICD-10-CM | POA: Diagnosis not present

## 2020-01-03 DIAGNOSIS — J029 Acute pharyngitis, unspecified: Secondary | ICD-10-CM

## 2020-01-03 DIAGNOSIS — R0981 Nasal congestion: Secondary | ICD-10-CM

## 2020-01-03 DIAGNOSIS — Z992 Dependence on renal dialysis: Secondary | ICD-10-CM | POA: Diagnosis not present

## 2020-01-03 DIAGNOSIS — D509 Iron deficiency anemia, unspecified: Secondary | ICD-10-CM | POA: Diagnosis not present

## 2020-01-03 DIAGNOSIS — D631 Anemia in chronic kidney disease: Secondary | ICD-10-CM | POA: Diagnosis not present

## 2020-01-03 DIAGNOSIS — N186 End stage renal disease: Secondary | ICD-10-CM | POA: Diagnosis not present

## 2020-01-03 DIAGNOSIS — N2589 Other disorders resulting from impaired renal tubular function: Secondary | ICD-10-CM | POA: Diagnosis not present

## 2020-01-03 DIAGNOSIS — N2581 Secondary hyperparathyroidism of renal origin: Secondary | ICD-10-CM | POA: Diagnosis not present

## 2020-01-03 DIAGNOSIS — R059 Cough, unspecified: Secondary | ICD-10-CM

## 2020-01-03 DIAGNOSIS — Z79899 Other long term (current) drug therapy: Secondary | ICD-10-CM | POA: Diagnosis not present

## 2020-01-03 MED ORDER — BENZONATATE 100 MG PO CAPS: 100.0000 mg | ORAL_CAPSULE | Freq: Three times a day (TID) | ORAL | 0 refills | Status: DC | PRN

## 2020-01-03 MED ORDER — PSEUDOEPHEDRINE HCL 30 MG PO TABS: 30.0000 mg | ORAL_TABLET | Freq: Three times a day (TID) | ORAL | 0 refills | Status: DC | PRN

## 2020-01-03 NOTE — ED Triage Notes (Addendum)
Pt presents with complaints of headache, scratchy throat, nasal congestion, and cough x 2 days. Pt is not wishing to have covid test completed. Reports some of his family has been sick as well.

## 2020-01-03 NOTE — ED Provider Notes (Signed)
Cory Dixon   MRN: [id number] DOB: 11-Aug-1985  Subjective:   Cory Dixon is a 34 y.o. male with ESRD presenting for 2 day hx of headache, scratchy throat, sinus congestion and dry cough.  Patient does not want COVID-19 testing.  Reports that some of his family members have also had a similar cold type of illness.  He has peritoneal dialysis daily.  Does this at home.  No current facility-administered medications for this encounter.  Current Outpatient Medications:  .  cinacalcet (SENSIPAR) 30 MG tablet, Take 2 tablets (60 mg total) by mouth daily with supper., Disp: 60 tablet, Rfl: 1 .  escitalopram (LEXAPRO) 5 MG tablet, Take 5 mg by mouth at bedtime., Disp: , Rfl:  .  lisinopril (PRINIVIL,ZESTRIL) 10 MG tablet, Take 10 mg by mouth daily., Disp: , Rfl:  .  pramoxine (SARNA SENSITIVE) 1 % LOTN, Apply 1 application topically 2 (two) times daily., Disp: 1 Bottle, Rfl: 0 .  sucroferric oxyhydroxide (VELPHORO) 500 MG chewable tablet, Chew 1,000 mg by mouth 3 (three) times daily., Disp: , Rfl:    Allergies  Allergen Reactions  . Nicotine Other (See Comments)    Experiences nightmare  . Percocet [Oxycodone-Acetaminophen] Itching and Other (See Comments)    constipation    Past Medical History:  Diagnosis Date  . Chicken pox   . CKD (chronic kidney disease), stage V ([place])   . Headache(784.0)   . Hyperkalemia 03/2017  . Hypertension   . Migraine   . Peritoneal dialysis status [facility])      Past Surgical History:  Procedure Laterality Date  . DIALYSIS FISTULA CREATION      Family History  Problem Relation Age of Onset  . Arthritis Mother   . Arthritis Maternal Grandmother   . Cancer Maternal Grandmother        breast  . Hypertension Maternal Grandmother   . Depression Maternal Grandmother   . Hypertension Maternal Grandfather   . Hypertension Paternal Grandmother   . Depression Paternal Grandmother   . Hypertension Paternal Grandfather     Social  History   Tobacco Use  . Smoking status: Current Every Day Smoker    Packs/day: 0.20    Years: 8.00    Pack years: 1.60    Types: Cigarettes  . Smokeless tobacco: Never Used  Vaping Use  . Vaping Use: Never used  Substance Use Topics  . Alcohol use: No  . Drug use: No    ROS   Objective:   Vitals: BP (!) 144/75   Pulse 72   Temp 98 F (36.7 C)   Resp 19   SpO2 100%   Physical Exam Constitutional:      General: He is not in acute distress.    Appearance: Normal appearance. He is well-developed. He is not ill-appearing, toxic-appearing or diaphoretic.  HENT:     Head: Normocephalic and atraumatic.     Right Ear: External ear normal.     Left Ear: External ear normal.     Nose: Nose normal.     Mouth/Throat:     Mouth: Mucous membranes are moist.     Pharynx: Oropharynx is clear.  Eyes:     General: No scleral icterus.    Extraocular Movements: Extraocular movements intact.     Pupils: Pupils are equal, round, and reactive to light.  Cardiovascular:     Rate and Rhythm: Normal rate and regular rhythm.     Heart sounds: Normal heart sounds. No murmur  heard.  No friction rub. No gallop.   Pulmonary:     Effort: Pulmonary effort is normal. No respiratory distress.     Breath sounds: Normal breath sounds. No stridor. No wheezing, rhonchi or rales.  Neurological:     Mental Status: He is alert and oriented to person, place, and time.  Psychiatric:        Mood and Affect: Mood normal.        Behavior: Behavior normal.        Thought Content: Thought content normal.      Assessment and Plan :   PDMP not reviewed this encounter.  1. Viral upper respiratory tract infection   2. Stuffy nose   3. Cough   4. Sore throat     Recommended supportive care for viral URI.  Patient refused COVID-19 test. Counseled patient on potential for adverse effects with medications prescribed/recommended today, ER and return-to-clinic precautions discussed, patient verbalized  understanding.    Jaynee Eagles, [place] 01/04/20 1635

## 2020-01-04 DIAGNOSIS — Z992 Dependence on renal dialysis: Secondary | ICD-10-CM | POA: Diagnosis not present

## 2020-01-04 DIAGNOSIS — E440 Moderate protein-calorie malnutrition: Secondary | ICD-10-CM | POA: Diagnosis not present

## 2020-01-04 DIAGNOSIS — N2589 Other disorders resulting from impaired renal tubular function: Secondary | ICD-10-CM | POA: Diagnosis not present

## 2020-01-04 DIAGNOSIS — E7841 Elevated Lipoprotein(a): Secondary | ICD-10-CM | POA: Diagnosis not present

## 2020-01-04 DIAGNOSIS — N2581 Secondary hyperparathyroidism of renal origin: Secondary | ICD-10-CM | POA: Diagnosis not present

## 2020-01-04 DIAGNOSIS — Z79899 Other long term (current) drug therapy: Secondary | ICD-10-CM | POA: Diagnosis not present

## 2020-01-04 DIAGNOSIS — D631 Anemia in chronic kidney disease: Secondary | ICD-10-CM | POA: Diagnosis not present

## 2020-01-04 DIAGNOSIS — N186 End stage renal disease: Secondary | ICD-10-CM | POA: Diagnosis not present

## 2020-01-04 DIAGNOSIS — D509 Iron deficiency anemia, unspecified: Secondary | ICD-10-CM | POA: Diagnosis not present

## 2020-01-05 DIAGNOSIS — D631 Anemia in chronic kidney disease: Secondary | ICD-10-CM | POA: Diagnosis not present

## 2020-01-05 DIAGNOSIS — E7841 Elevated Lipoprotein(a): Secondary | ICD-10-CM | POA: Diagnosis not present

## 2020-01-05 DIAGNOSIS — E440 Moderate protein-calorie malnutrition: Secondary | ICD-10-CM | POA: Diagnosis not present

## 2020-01-05 DIAGNOSIS — I129 Hypertensive chronic kidney disease with stage 1 through stage 4 chronic kidney disease, or unspecified chronic kidney disease: Secondary | ICD-10-CM | POA: Diagnosis not present

## 2020-01-05 DIAGNOSIS — N2589 Other disorders resulting from impaired renal tubular function: Secondary | ICD-10-CM | POA: Diagnosis not present

## 2020-01-05 DIAGNOSIS — Z992 Dependence on renal dialysis: Secondary | ICD-10-CM | POA: Diagnosis not present

## 2020-01-05 DIAGNOSIS — N186 End stage renal disease: Secondary | ICD-10-CM | POA: Diagnosis not present

## 2020-01-05 DIAGNOSIS — D509 Iron deficiency anemia, unspecified: Secondary | ICD-10-CM | POA: Diagnosis not present

## 2020-01-05 DIAGNOSIS — N2581 Secondary hyperparathyroidism of renal origin: Secondary | ICD-10-CM | POA: Diagnosis not present

## 2020-01-05 DIAGNOSIS — Z79899 Other long term (current) drug therapy: Secondary | ICD-10-CM | POA: Diagnosis not present

## 2020-01-06 DIAGNOSIS — Z23 Encounter for immunization: Secondary | ICD-10-CM | POA: Diagnosis not present

## 2020-01-06 DIAGNOSIS — Z79899 Other long term (current) drug therapy: Secondary | ICD-10-CM | POA: Diagnosis not present

## 2020-01-06 DIAGNOSIS — Z4932 Encounter for adequacy testing for peritoneal dialysis: Secondary | ICD-10-CM | POA: Diagnosis not present

## 2020-01-06 DIAGNOSIS — Z992 Dependence on renal dialysis: Secondary | ICD-10-CM | POA: Diagnosis not present

## 2020-01-06 DIAGNOSIS — D631 Anemia in chronic kidney disease: Secondary | ICD-10-CM | POA: Diagnosis not present

## 2020-01-06 DIAGNOSIS — D509 Iron deficiency anemia, unspecified: Secondary | ICD-10-CM | POA: Diagnosis not present

## 2020-01-06 DIAGNOSIS — N2589 Other disorders resulting from impaired renal tubular function: Secondary | ICD-10-CM | POA: Diagnosis not present

## 2020-01-06 DIAGNOSIS — N2581 Secondary hyperparathyroidism of renal origin: Secondary | ICD-10-CM | POA: Diagnosis not present

## 2020-01-06 DIAGNOSIS — E440 Moderate protein-calorie malnutrition: Secondary | ICD-10-CM | POA: Diagnosis not present

## 2020-01-06 DIAGNOSIS — N186 End stage renal disease: Secondary | ICD-10-CM | POA: Diagnosis not present

## 2020-01-07 DIAGNOSIS — N2581 Secondary hyperparathyroidism of renal origin: Secondary | ICD-10-CM | POA: Diagnosis not present

## 2020-01-07 DIAGNOSIS — Z23 Encounter for immunization: Secondary | ICD-10-CM | POA: Diagnosis not present

## 2020-01-07 DIAGNOSIS — D509 Iron deficiency anemia, unspecified: Secondary | ICD-10-CM | POA: Diagnosis not present

## 2020-01-07 DIAGNOSIS — Z992 Dependence on renal dialysis: Secondary | ICD-10-CM | POA: Diagnosis not present

## 2020-01-07 DIAGNOSIS — Z4932 Encounter for adequacy testing for peritoneal dialysis: Secondary | ICD-10-CM | POA: Diagnosis not present

## 2020-01-07 DIAGNOSIS — N2589 Other disorders resulting from impaired renal tubular function: Secondary | ICD-10-CM | POA: Diagnosis not present

## 2020-01-07 DIAGNOSIS — D631 Anemia in chronic kidney disease: Secondary | ICD-10-CM | POA: Diagnosis not present

## 2020-01-07 DIAGNOSIS — Z79899 Other long term (current) drug therapy: Secondary | ICD-10-CM | POA: Diagnosis not present

## 2020-01-07 DIAGNOSIS — N186 End stage renal disease: Secondary | ICD-10-CM | POA: Diagnosis not present

## 2020-01-07 DIAGNOSIS — E440 Moderate protein-calorie malnutrition: Secondary | ICD-10-CM | POA: Diagnosis not present

## 2020-01-08 DIAGNOSIS — N2581 Secondary hyperparathyroidism of renal origin: Secondary | ICD-10-CM | POA: Diagnosis not present

## 2020-01-08 DIAGNOSIS — N2589 Other disorders resulting from impaired renal tubular function: Secondary | ICD-10-CM | POA: Diagnosis not present

## 2020-01-08 DIAGNOSIS — N186 End stage renal disease: Secondary | ICD-10-CM | POA: Diagnosis not present

## 2020-01-08 DIAGNOSIS — Z23 Encounter for immunization: Secondary | ICD-10-CM | POA: Diagnosis not present

## 2020-01-08 DIAGNOSIS — D631 Anemia in chronic kidney disease: Secondary | ICD-10-CM | POA: Diagnosis not present

## 2020-01-08 DIAGNOSIS — Z4932 Encounter for adequacy testing for peritoneal dialysis: Secondary | ICD-10-CM | POA: Diagnosis not present

## 2020-01-08 DIAGNOSIS — D509 Iron deficiency anemia, unspecified: Secondary | ICD-10-CM | POA: Diagnosis not present

## 2020-01-08 DIAGNOSIS — Z79899 Other long term (current) drug therapy: Secondary | ICD-10-CM | POA: Diagnosis not present

## 2020-01-08 DIAGNOSIS — Z992 Dependence on renal dialysis: Secondary | ICD-10-CM | POA: Diagnosis not present

## 2020-01-08 DIAGNOSIS — E440 Moderate protein-calorie malnutrition: Secondary | ICD-10-CM | POA: Diagnosis not present

## 2020-01-09 DIAGNOSIS — Z4932 Encounter for adequacy testing for peritoneal dialysis: Secondary | ICD-10-CM | POA: Diagnosis not present

## 2020-01-09 DIAGNOSIS — Z79899 Other long term (current) drug therapy: Secondary | ICD-10-CM | POA: Diagnosis not present

## 2020-01-09 DIAGNOSIS — Z992 Dependence on renal dialysis: Secondary | ICD-10-CM | POA: Diagnosis not present

## 2020-01-09 DIAGNOSIS — E440 Moderate protein-calorie malnutrition: Secondary | ICD-10-CM | POA: Diagnosis not present

## 2020-01-09 DIAGNOSIS — Z23 Encounter for immunization: Secondary | ICD-10-CM | POA: Diagnosis not present

## 2020-01-09 DIAGNOSIS — N2581 Secondary hyperparathyroidism of renal origin: Secondary | ICD-10-CM | POA: Diagnosis not present

## 2020-01-09 DIAGNOSIS — D631 Anemia in chronic kidney disease: Secondary | ICD-10-CM | POA: Diagnosis not present

## 2020-01-09 DIAGNOSIS — N2589 Other disorders resulting from impaired renal tubular function: Secondary | ICD-10-CM | POA: Diagnosis not present

## 2020-01-09 DIAGNOSIS — N186 End stage renal disease: Secondary | ICD-10-CM | POA: Diagnosis not present

## 2020-01-09 DIAGNOSIS — D509 Iron deficiency anemia, unspecified: Secondary | ICD-10-CM | POA: Diagnosis not present

## 2020-01-10 ENCOUNTER — Ambulatory Visit (INDEPENDENT_AMBULATORY_CARE_PROVIDER_SITE_OTHER): Payer: Medicare Other | Admitting: Family Medicine

## 2020-01-10 ENCOUNTER — Other Ambulatory Visit: Payer: Self-pay

## 2020-01-10 ENCOUNTER — Encounter: Payer: Self-pay | Admitting: Family Medicine

## 2020-01-10 VITALS — BP 110/70 | HR 79 | Temp 98.4°F | Ht 72.0 in | Wt 185.0 lb

## 2020-01-10 DIAGNOSIS — Z992 Dependence on renal dialysis: Secondary | ICD-10-CM | POA: Diagnosis not present

## 2020-01-10 DIAGNOSIS — Z0000 Encounter for general adult medical examination without abnormal findings: Secondary | ICD-10-CM | POA: Diagnosis not present

## 2020-01-10 DIAGNOSIS — N2581 Secondary hyperparathyroidism of renal origin: Secondary | ICD-10-CM | POA: Diagnosis not present

## 2020-01-10 DIAGNOSIS — N186 End stage renal disease: Secondary | ICD-10-CM | POA: Diagnosis not present

## 2020-01-10 DIAGNOSIS — Z79899 Other long term (current) drug therapy: Secondary | ICD-10-CM | POA: Diagnosis not present

## 2020-01-10 DIAGNOSIS — Z4932 Encounter for adequacy testing for peritoneal dialysis: Secondary | ICD-10-CM | POA: Diagnosis not present

## 2020-01-10 DIAGNOSIS — E440 Moderate protein-calorie malnutrition: Secondary | ICD-10-CM | POA: Diagnosis not present

## 2020-01-10 DIAGNOSIS — D509 Iron deficiency anemia, unspecified: Secondary | ICD-10-CM | POA: Diagnosis not present

## 2020-01-10 DIAGNOSIS — Z23 Encounter for immunization: Secondary | ICD-10-CM | POA: Diagnosis not present

## 2020-01-10 DIAGNOSIS — N2589 Other disorders resulting from impaired renal tubular function: Secondary | ICD-10-CM | POA: Diagnosis not present

## 2020-01-10 DIAGNOSIS — D631 Anemia in chronic kidney disease: Secondary | ICD-10-CM | POA: Diagnosis not present

## 2020-01-10 NOTE — Progress Notes (Signed)
Established Patient Office Visit  Subjective:  Patient ID: Cory Dixon, male    DOB: [birth year]  Age: 34 y.o. MRN: [id number]  CC: No chief complaint on file.   HPI Cory Dixon presents for physical exam.  He has chronic kidney disease and is on peritoneal dialysis since 2017.  He has been evaluated by [facility] renal transplant service.  He unfortunately has history of smoking and is still trying to taper off nicotine.  His recent use of nicotine gum.  He has scaled back greatly.  He has hypertension discharge only treated per nephrology with lisinopril and furosemide.  Poorly controlled hypertension apparently was the basis for his chronic kidney disease and end-stage renal disease  He thinks he had tetanus 2016.  Had flu vaccine yesterday.  Has had Covid vaccine.  He states he had lab work done just yesterday.  He has had one-time previous Pneumovax  Family history-Father's health is unknown.  Mother has history of osteoarthritis.  Brother recently diagnosed with type 1 diabetes.  Hypertension in several grandparents.  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  Smoking history as above.  Currently in process of trying to discontinue.  No alcohol use.  Single.   Past Medical History:  Diagnosis Date  . Chicken pox   . CKD (chronic kidney disease), stage V ([place])   . Headache(784.0)   . Hyperkalemia 03/2017  . Hypertension   . Migraine   . Peritoneal dialysis status [facility])     Past Surgical History:  Procedure Laterality Date  . DIALYSIS FISTULA CREATION      Family History  Problem Relation Age of Onset  . Arthritis Mother   . Arthritis Maternal Grandmother   . Cancer Maternal Grandmother        breast  . Hypertension Maternal Grandmother   . Depression Maternal Grandmother   . Hypertension Maternal Grandfather   . Hypertension Paternal Grandmother   . Depression Paternal Grandmother   . Hypertension Paternal Grandfather   .  Diabetes Brother        type 1 diabetes    Social History   Socioeconomic History  . Marital status: Single    Spouse name: Not on file  . Number of children: Not on file  . Years of education: Not on file  . Highest education level: Not on file  Occupational History  . Not on file  Tobacco Use  . Smoking status: Current Every Day Smoker    Packs/day: 0.20    Years: 8.00    Pack years: 1.60    Types: Cigarettes  . Smokeless tobacco: Never Used  Vaping Use  . Vaping Use: Never used  Substance and Sexual Activity  . Alcohol use: No  . Drug use: No  . Sexual activity: Not on file  Other Topics Concern  . Not on file  Social History Narrative  . Not on file   Social Determinants of Health   Financial Resource Strain:   . Difficulty of Paying Living Expenses: Not on file  Food Insecurity:   . Worried About Charity fundraiser in the Last Year: Not on file  . Ran Out of Food in the Last Year: Not on file  Transportation Needs:   . Lack of Transportation (Medical): Not on file  . Lack of Transportation (Non-Medical): Not on file  Physical Activity:   . Days of Exercise per Week: Not on file  . Minutes of Exercise per Session: Not  on file  Stress:   . Feeling of Stress : Not on file  Social Connections:   . Frequency of Communication with Friends and Family: Not on file  . Frequency of Social Gatherings with Friends and Family: Not on file  . Attends Religious Services: Not on file  . Active Member of Clubs or Organizations: Not on file  . Attends Archivist Meetings: Not on file  . Marital Status: Not on file  Intimate Partner Violence:   . Fear of Current or Ex-Partner: Not on file  . Emotionally Abused: Not on file  . Physically Abused: Not on file  . Sexually Abused: Not on file    Outpatient Medications Prior to Visit  Medication Sig Dispense Refill  . AURYXIA 1 GM 210 MG(Fe) tablet Take 630 mg by mouth 3 (three) times daily as needed.    . B  Complex-C-Folic Acid (DIALYVITE 175) 0.8 MG TABS Take 1 tablet by mouth daily.    . calcitRIOL (ROCALTROL) 0.25 MCG capsule Take by mouth.    . Cholecalciferol 25 MCG (1000 UT) tablet Take by mouth.    . cinacalcet (SENSIPAR) 60 MG tablet Take by mouth.    . furosemide (LASIX) 80 MG tablet Take 80 mg by mouth daily.    Marland Kitchen gentamicin cream (GARAMYCIN) 0.1 % Apply topically.    Marland Kitchen lisinopril (PRINIVIL,ZESTRIL) 10 MG tablet Take 10 mg by mouth daily.    . benzonatate (TESSALON) 100 MG capsule Take 1-2 capsules (100-200 mg total) by mouth 3 (three) times daily as needed for cough. 60 capsule 0  . cinacalcet (SENSIPAR) 30 MG tablet Take 2 tablets (60 mg total) by mouth daily with supper. 60 tablet 1  . pramoxine (SARNA SENSITIVE) 1 % LOTN Apply 1 application topically 2 (two) times daily. (Patient not taking: Reported on 01/10/2020) 1 Bottle 0  . escitalopram (LEXAPRO) 5 MG tablet Take 5 mg by mouth at bedtime. (Patient not taking: Reported on 01/10/2020)    . pseudoephedrine (SUDAFED) 30 MG tablet Take 1 tablet (30 mg total) by mouth every 8 (eight) hours as needed for congestion. (Patient not taking: Reported on 01/10/2020) 30 tablet 0  . sucroferric oxyhydroxide (VELPHORO) 500 MG chewable tablet Chew 1,000 mg by mouth 3 (three) times daily. (Patient not taking: Reported on 01/10/2020)     No facility-administered medications prior to visit.    Allergies  Allergen Reactions  . Nicotine Other (See Comments)    Experiences nightmare  . Percocet [Oxycodone-Acetaminophen] Itching and Other (See Comments)    constipation    ROS Review of Systems  Constitutional: Negative for activity change, appetite change, fatigue and fever.  HENT: Negative for congestion, ear pain and trouble swallowing.   Eyes: Negative for pain and visual disturbance.  Respiratory: Negative for cough, shortness of breath and wheezing.   Cardiovascular: Negative for chest pain and palpitations.  Gastrointestinal: Negative for  abdominal distention, abdominal pain, blood in stool, constipation, diarrhea, nausea, rectal pain and vomiting.  Genitourinary: Negative for dysuria, hematuria and testicular pain.  Musculoskeletal: Negative for arthralgias and joint swelling.  Skin: Negative for rash.  Neurological: Negative for dizziness, syncope and headaches.  Hematological: Negative for adenopathy.  Psychiatric/Behavioral: Negative for confusion and dysphoric mood.      Objective:    Physical Exam Vitals reviewed.  Constitutional:      Appearance: Normal appearance.  HENT:     Right Ear: Tympanic membrane normal.     Left Ear: Tympanic membrane normal.  Mouth/Throat:     Mouth: Mucous membranes are moist.     Pharynx: Oropharynx is clear. No oropharyngeal exudate or posterior oropharyngeal erythema.  Cardiovascular:     Rate and Rhythm: Normal rate and regular rhythm.  Pulmonary:     Effort: Pulmonary effort is normal.     Breath sounds: Normal breath sounds.  Abdominal:     Palpations: Abdomen is soft.     Tenderness: There is no abdominal tenderness.  Musculoskeletal:     Cervical back: Neck supple.     Right lower leg: No edema.     Left lower leg: No edema.  Lymphadenopathy:     Cervical: No cervical adenopathy.  Skin:    Findings: No rash.  Neurological:     General: No focal deficit present.     Mental Status: He is alert.     Cranial Nerves: No cranial nerve deficit.     BP 110/70 (BP Location: Right Arm, Patient Position: Sitting, Cuff Size: Normal)   Pulse 79   Temp 98.4 F (36.9 C) (Oral)   Ht 6' (1.829 m)   Wt 185 lb (83.9 kg)   SpO2 97%   BMI 25.09 kg/m  Wt Readings from Last 3 Encounters:  01/10/20 185 lb (83.9 kg)  10/23/17 214 lb 15.2 oz (97.5 kg)  04/02/17 219 lb 8 oz (99.6 kg)     Health Maintenance Due  Topic Date Due  . COVID-19 Vaccine (1) Never done  . TETANUS/TDAP  04/30/2017    There are no preventive care reminders to display for this patient.  Lab  Results  Component Value Date   TSH 1.64 12/30/2010   Lab Results  Component Value Date   WBC 8.9 03/19/2017   HGB 12.2 (L) 10/24/2017   HCT 36.0 (L) 10/24/2017   MCV 107.5 (H) 03/19/2017   PLT 243 03/19/2017   Lab Results  Component Value Date   NA 138 10/24/2017   K 3.7 10/24/2017   CO2 25 03/20/2017   GLUCOSE 85 10/24/2017   BUN 26 (H) 10/24/2017   CREATININE 7.60 (H) 10/24/2017   BILITOT 0.7 03/19/2017   ALKPHOS 71 03/19/2017   AST 22 03/19/2017   ALT 30 03/19/2017   PROT 7.2 03/19/2017   ALBUMIN 4.0 03/19/2017   CALCIUM 9.3 03/20/2017   ANIONGAP 12 03/20/2017   GFR 45.45 (L) 12/30/2010   Lab Results  Component Value Date   CHOL 257 (H) 12/30/2010   Lab Results  Component Value Date   HDL 43.30 12/30/2010   No results found for: [facility] Lab Results  Component Value Date   TRIG 168.0 (H) 12/30/2010   Lab Results  Component Value Date   CHOLHDL 6 12/30/2010   No results found for: HGBA1C    Assessment & Plan:   Problem List Items Addressed This Visit    None    Visit Diagnoses    Physical exam    -  Primary    Cotey has history of end-stage renal disease on peritoneal dialysis.  Currently looking at renal transplant program at [facility].  Still has some nicotine use but close to quitting.  Motivation is improved at this time. Immunizations up-to-date. No labs obtained as he gets these regularly through nephrology.  No orders of the defined types were placed in this encounter.   Follow-up: No follow-ups on file.    Carolann Littler, MD

## 2020-01-10 NOTE — Patient Instructions (Signed)
Preventive Care 19-35 Years Old, Male Preventive care refers to lifestyle choices and visits with your health care provider that can promote health and wellness. This includes:  A yearly physical exam. This is also called an annual well check.  Regular dental and eye exams.  Immunizations.  Screening for certain conditions.  Healthy lifestyle choices, such as eating a healthy diet, getting regular exercise, not using drugs or products that contain nicotine and tobacco, and limiting alcohol use. What can I expect for my preventive care visit? Physical exam Your health care provider will check:  Height and weight. These may be used to calculate body mass index (BMI), which is a measurement that tells if you are at a healthy weight.  Heart rate and blood pressure.  Your skin for abnormal spots. Counseling Your health care provider may ask you questions about:  Alcohol, tobacco, and drug use.  Emotional well-being.  Home and relationship well-being.  Sexual activity.  Eating habits.  Work and work Statistician. What immunizations do I need?  Influenza (flu) vaccine  This is recommended every year. Tetanus, diphtheria, and pertussis (Tdap) vaccine  You may need a Td booster every 10 years. Varicella (chickenpox) vaccine  You may need this vaccine if you have not already been vaccinated. Human papillomavirus (HPV) vaccine  If recommended by your health care provider, you may need three doses over 6 months. Measles, mumps, and rubella (MMR) vaccine  You may need at least one dose of MMR. You may also need a second dose. Meningococcal conjugate (MenACWY) vaccine  One dose is recommended if you are 45-76 years old and a Market researcher living in a residence hall, or if you have one of several medical conditions. You may also need additional booster doses. Pneumococcal conjugate (PCV13) vaccine  You may need this if you have certain conditions and were not  previously vaccinated. Pneumococcal polysaccharide (PPSV23) vaccine  You may need one or two doses if you smoke cigarettes or if you have certain conditions. Hepatitis A vaccine  You may need this if you have certain conditions or if you travel or work in places where you may be exposed to hepatitis A. Hepatitis B vaccine  You may need this if you have certain conditions or if you travel or work in places where you may be exposed to hepatitis B. Haemophilus influenzae type b (Hib) vaccine  You may need this if you have certain risk factors. You may receive vaccines as individual doses or as more than one vaccine together in one shot (combination vaccines). Talk with your health care provider about the risks and benefits of combination vaccines. What tests do I need? Blood tests  Lipid and cholesterol levels. These may be checked every 5 years starting at age 17.  Hepatitis C test.  Hepatitis B test. Screening   Diabetes screening. This is done by checking your blood sugar (glucose) after you have not eaten for a while (fasting).  Sexually transmitted disease (STD) testing. Talk with your health care provider about your test results, treatment options, and if necessary, the need for more tests. Follow these instructions at home: Eating and drinking   Eat a diet that includes fresh fruits and vegetables, whole grains, lean protein, and low-fat dairy products.  Take vitamin and mineral supplements as recommended by your health care provider.  Do not drink alcohol if your health care provider tells you not to drink.  If you drink alcohol: ? Limit how much you have to 0-2  drinks a day. ? Be aware of how much alcohol is in your drink. In the [place] one drink equals one 12 oz bottle of beer (355 mL), one 5 oz glass of wine (148 mL), or one 1 oz glass of hard liquor (44 mL). Lifestyle  Take daily care of your teeth and gums.  Stay active. Exercise for at least 30 minutes on 5 or  more days each week.  Do not use any products that contain nicotine or tobacco, such as cigarettes, e-cigarettes, and chewing tobacco. If you need help quitting, ask your health care provider.  If you are sexually active, practice safe sex. Use a condom or other form of protection to prevent STIs (sexually transmitted infections). What's next?  Go to your health care provider once a year for a well check visit.  Ask your health care provider how often you should have your eyes and teeth checked.  Stay up to date on all vaccines. This information is not intended to replace advice given to you by your health care provider. Make sure you discuss any questions you have with your health care provider. Document Revised: 03/18/2018 Document Reviewed: 03/18/2018 Elsevier Patient Education  2020 [employer].

## 2020-01-11 DIAGNOSIS — Z23 Encounter for immunization: Secondary | ICD-10-CM | POA: Diagnosis not present

## 2020-01-11 DIAGNOSIS — Z4932 Encounter for adequacy testing for peritoneal dialysis: Secondary | ICD-10-CM | POA: Diagnosis not present

## 2020-01-11 DIAGNOSIS — Z79899 Other long term (current) drug therapy: Secondary | ICD-10-CM | POA: Diagnosis not present

## 2020-01-11 DIAGNOSIS — D509 Iron deficiency anemia, unspecified: Secondary | ICD-10-CM | POA: Diagnosis not present

## 2020-01-11 DIAGNOSIS — N186 End stage renal disease: Secondary | ICD-10-CM | POA: Diagnosis not present

## 2020-01-11 DIAGNOSIS — N2581 Secondary hyperparathyroidism of renal origin: Secondary | ICD-10-CM | POA: Diagnosis not present

## 2020-01-11 DIAGNOSIS — N2589 Other disorders resulting from impaired renal tubular function: Secondary | ICD-10-CM | POA: Diagnosis not present

## 2020-01-11 DIAGNOSIS — E440 Moderate protein-calorie malnutrition: Secondary | ICD-10-CM | POA: Diagnosis not present

## 2020-01-11 DIAGNOSIS — Z992 Dependence on renal dialysis: Secondary | ICD-10-CM | POA: Diagnosis not present

## 2020-01-11 DIAGNOSIS — D631 Anemia in chronic kidney disease: Secondary | ICD-10-CM | POA: Diagnosis not present

## 2020-01-12 DIAGNOSIS — N2581 Secondary hyperparathyroidism of renal origin: Secondary | ICD-10-CM | POA: Diagnosis not present

## 2020-01-12 DIAGNOSIS — D631 Anemia in chronic kidney disease: Secondary | ICD-10-CM | POA: Diagnosis not present

## 2020-01-12 DIAGNOSIS — Z992 Dependence on renal dialysis: Secondary | ICD-10-CM | POA: Diagnosis not present

## 2020-01-12 DIAGNOSIS — Z4932 Encounter for adequacy testing for peritoneal dialysis: Secondary | ICD-10-CM | POA: Diagnosis not present

## 2020-01-12 DIAGNOSIS — Z23 Encounter for immunization: Secondary | ICD-10-CM | POA: Diagnosis not present

## 2020-01-12 DIAGNOSIS — E440 Moderate protein-calorie malnutrition: Secondary | ICD-10-CM | POA: Diagnosis not present

## 2020-01-12 DIAGNOSIS — N186 End stage renal disease: Secondary | ICD-10-CM | POA: Diagnosis not present

## 2020-01-12 DIAGNOSIS — D509 Iron deficiency anemia, unspecified: Secondary | ICD-10-CM | POA: Diagnosis not present

## 2020-01-12 DIAGNOSIS — Z79899 Other long term (current) drug therapy: Secondary | ICD-10-CM | POA: Diagnosis not present

## 2020-01-12 DIAGNOSIS — N2589 Other disorders resulting from impaired renal tubular function: Secondary | ICD-10-CM | POA: Diagnosis not present

## 2020-01-13 DIAGNOSIS — Z4932 Encounter for adequacy testing for peritoneal dialysis: Secondary | ICD-10-CM | POA: Diagnosis not present

## 2020-01-13 DIAGNOSIS — Z79899 Other long term (current) drug therapy: Secondary | ICD-10-CM | POA: Diagnosis not present

## 2020-01-13 DIAGNOSIS — Z992 Dependence on renal dialysis: Secondary | ICD-10-CM | POA: Diagnosis not present

## 2020-01-13 DIAGNOSIS — N2589 Other disorders resulting from impaired renal tubular function: Secondary | ICD-10-CM | POA: Diagnosis not present

## 2020-01-13 DIAGNOSIS — N2581 Secondary hyperparathyroidism of renal origin: Secondary | ICD-10-CM | POA: Diagnosis not present

## 2020-01-13 DIAGNOSIS — Z23 Encounter for immunization: Secondary | ICD-10-CM | POA: Diagnosis not present

## 2020-01-13 DIAGNOSIS — D509 Iron deficiency anemia, unspecified: Secondary | ICD-10-CM | POA: Diagnosis not present

## 2020-01-13 DIAGNOSIS — D631 Anemia in chronic kidney disease: Secondary | ICD-10-CM | POA: Diagnosis not present

## 2020-01-13 DIAGNOSIS — E440 Moderate protein-calorie malnutrition: Secondary | ICD-10-CM | POA: Diagnosis not present

## 2020-01-13 DIAGNOSIS — N186 End stage renal disease: Secondary | ICD-10-CM | POA: Diagnosis not present

## 2020-01-14 DIAGNOSIS — Z79899 Other long term (current) drug therapy: Secondary | ICD-10-CM | POA: Diagnosis not present

## 2020-01-14 DIAGNOSIS — Z23 Encounter for immunization: Secondary | ICD-10-CM | POA: Diagnosis not present

## 2020-01-14 DIAGNOSIS — D631 Anemia in chronic kidney disease: Secondary | ICD-10-CM | POA: Diagnosis not present

## 2020-01-14 DIAGNOSIS — N2581 Secondary hyperparathyroidism of renal origin: Secondary | ICD-10-CM | POA: Diagnosis not present

## 2020-01-14 DIAGNOSIS — D509 Iron deficiency anemia, unspecified: Secondary | ICD-10-CM | POA: Diagnosis not present

## 2020-01-14 DIAGNOSIS — N186 End stage renal disease: Secondary | ICD-10-CM | POA: Diagnosis not present

## 2020-01-14 DIAGNOSIS — E440 Moderate protein-calorie malnutrition: Secondary | ICD-10-CM | POA: Diagnosis not present

## 2020-01-14 DIAGNOSIS — Z992 Dependence on renal dialysis: Secondary | ICD-10-CM | POA: Diagnosis not present

## 2020-01-14 DIAGNOSIS — Z4932 Encounter for adequacy testing for peritoneal dialysis: Secondary | ICD-10-CM | POA: Diagnosis not present

## 2020-01-14 DIAGNOSIS — N2589 Other disorders resulting from impaired renal tubular function: Secondary | ICD-10-CM | POA: Diagnosis not present

## 2020-01-15 DIAGNOSIS — N2589 Other disorders resulting from impaired renal tubular function: Secondary | ICD-10-CM | POA: Diagnosis not present

## 2020-01-15 DIAGNOSIS — Z992 Dependence on renal dialysis: Secondary | ICD-10-CM | POA: Diagnosis not present

## 2020-01-15 DIAGNOSIS — N2581 Secondary hyperparathyroidism of renal origin: Secondary | ICD-10-CM | POA: Diagnosis not present

## 2020-01-15 DIAGNOSIS — D509 Iron deficiency anemia, unspecified: Secondary | ICD-10-CM | POA: Diagnosis not present

## 2020-01-15 DIAGNOSIS — D631 Anemia in chronic kidney disease: Secondary | ICD-10-CM | POA: Diagnosis not present

## 2020-01-15 DIAGNOSIS — E440 Moderate protein-calorie malnutrition: Secondary | ICD-10-CM | POA: Diagnosis not present

## 2020-01-15 DIAGNOSIS — Z4932 Encounter for adequacy testing for peritoneal dialysis: Secondary | ICD-10-CM | POA: Diagnosis not present

## 2020-01-15 DIAGNOSIS — Z79899 Other long term (current) drug therapy: Secondary | ICD-10-CM | POA: Diagnosis not present

## 2020-01-15 DIAGNOSIS — Z23 Encounter for immunization: Secondary | ICD-10-CM | POA: Diagnosis not present

## 2020-01-15 DIAGNOSIS — N186 End stage renal disease: Secondary | ICD-10-CM | POA: Diagnosis not present

## 2020-01-16 DIAGNOSIS — Z23 Encounter for immunization: Secondary | ICD-10-CM | POA: Diagnosis not present

## 2020-01-16 DIAGNOSIS — Z79899 Other long term (current) drug therapy: Secondary | ICD-10-CM | POA: Diagnosis not present

## 2020-01-16 DIAGNOSIS — Z992 Dependence on renal dialysis: Secondary | ICD-10-CM | POA: Diagnosis not present

## 2020-01-16 DIAGNOSIS — D631 Anemia in chronic kidney disease: Secondary | ICD-10-CM | POA: Diagnosis not present

## 2020-01-16 DIAGNOSIS — N2589 Other disorders resulting from impaired renal tubular function: Secondary | ICD-10-CM | POA: Diagnosis not present

## 2020-01-16 DIAGNOSIS — N2581 Secondary hyperparathyroidism of renal origin: Secondary | ICD-10-CM | POA: Diagnosis not present

## 2020-01-16 DIAGNOSIS — D509 Iron deficiency anemia, unspecified: Secondary | ICD-10-CM | POA: Diagnosis not present

## 2020-01-16 DIAGNOSIS — E440 Moderate protein-calorie malnutrition: Secondary | ICD-10-CM | POA: Diagnosis not present

## 2020-01-16 DIAGNOSIS — N186 End stage renal disease: Secondary | ICD-10-CM | POA: Diagnosis not present

## 2020-01-16 DIAGNOSIS — Z4932 Encounter for adequacy testing for peritoneal dialysis: Secondary | ICD-10-CM | POA: Diagnosis not present

## 2020-01-17 DIAGNOSIS — Z79899 Other long term (current) drug therapy: Secondary | ICD-10-CM | POA: Diagnosis not present

## 2020-01-17 DIAGNOSIS — D509 Iron deficiency anemia, unspecified: Secondary | ICD-10-CM | POA: Diagnosis not present

## 2020-01-17 DIAGNOSIS — Z4932 Encounter for adequacy testing for peritoneal dialysis: Secondary | ICD-10-CM | POA: Diagnosis not present

## 2020-01-17 DIAGNOSIS — N186 End stage renal disease: Secondary | ICD-10-CM | POA: Diagnosis not present

## 2020-01-17 DIAGNOSIS — N2581 Secondary hyperparathyroidism of renal origin: Secondary | ICD-10-CM | POA: Diagnosis not present

## 2020-01-17 DIAGNOSIS — N2589 Other disorders resulting from impaired renal tubular function: Secondary | ICD-10-CM | POA: Diagnosis not present

## 2020-01-17 DIAGNOSIS — E440 Moderate protein-calorie malnutrition: Secondary | ICD-10-CM | POA: Diagnosis not present

## 2020-01-17 DIAGNOSIS — D631 Anemia in chronic kidney disease: Secondary | ICD-10-CM | POA: Diagnosis not present

## 2020-01-17 DIAGNOSIS — Z23 Encounter for immunization: Secondary | ICD-10-CM | POA: Diagnosis not present

## 2020-01-17 DIAGNOSIS — Z992 Dependence on renal dialysis: Secondary | ICD-10-CM | POA: Diagnosis not present

## 2020-01-18 DIAGNOSIS — Z4932 Encounter for adequacy testing for peritoneal dialysis: Secondary | ICD-10-CM | POA: Diagnosis not present

## 2020-01-18 DIAGNOSIS — Z79899 Other long term (current) drug therapy: Secondary | ICD-10-CM | POA: Diagnosis not present

## 2020-01-18 DIAGNOSIS — Z23 Encounter for immunization: Secondary | ICD-10-CM | POA: Diagnosis not present

## 2020-01-18 DIAGNOSIS — Z992 Dependence on renal dialysis: Secondary | ICD-10-CM | POA: Diagnosis not present

## 2020-01-18 DIAGNOSIS — D631 Anemia in chronic kidney disease: Secondary | ICD-10-CM | POA: Diagnosis not present

## 2020-01-18 DIAGNOSIS — N2581 Secondary hyperparathyroidism of renal origin: Secondary | ICD-10-CM | POA: Diagnosis not present

## 2020-01-18 DIAGNOSIS — N2589 Other disorders resulting from impaired renal tubular function: Secondary | ICD-10-CM | POA: Diagnosis not present

## 2020-01-18 DIAGNOSIS — N186 End stage renal disease: Secondary | ICD-10-CM | POA: Diagnosis not present

## 2020-01-18 DIAGNOSIS — D509 Iron deficiency anemia, unspecified: Secondary | ICD-10-CM | POA: Diagnosis not present

## 2020-01-18 DIAGNOSIS — E440 Moderate protein-calorie malnutrition: Secondary | ICD-10-CM | POA: Diagnosis not present

## 2020-01-19 ENCOUNTER — Telehealth: Payer: Self-pay | Admitting: Family Medicine

## 2020-01-19 DIAGNOSIS — Z4932 Encounter for adequacy testing for peritoneal dialysis: Secondary | ICD-10-CM | POA: Diagnosis not present

## 2020-01-19 DIAGNOSIS — N186 End stage renal disease: Secondary | ICD-10-CM | POA: Diagnosis not present

## 2020-01-19 DIAGNOSIS — Z79899 Other long term (current) drug therapy: Secondary | ICD-10-CM | POA: Diagnosis not present

## 2020-01-19 DIAGNOSIS — Z23 Encounter for immunization: Secondary | ICD-10-CM | POA: Diagnosis not present

## 2020-01-19 DIAGNOSIS — N2581 Secondary hyperparathyroidism of renal origin: Secondary | ICD-10-CM | POA: Diagnosis not present

## 2020-01-19 DIAGNOSIS — N2589 Other disorders resulting from impaired renal tubular function: Secondary | ICD-10-CM | POA: Diagnosis not present

## 2020-01-19 DIAGNOSIS — E440 Moderate protein-calorie malnutrition: Secondary | ICD-10-CM | POA: Diagnosis not present

## 2020-01-19 DIAGNOSIS — D631 Anemia in chronic kidney disease: Secondary | ICD-10-CM | POA: Diagnosis not present

## 2020-01-19 DIAGNOSIS — Z992 Dependence on renal dialysis: Secondary | ICD-10-CM | POA: Diagnosis not present

## 2020-01-19 DIAGNOSIS — D509 Iron deficiency anemia, unspecified: Secondary | ICD-10-CM | POA: Diagnosis not present

## 2020-01-19 NOTE — Telephone Encounter (Signed)
I have never seen a requirement for a note to get a DOT PE at urgent care.   Most DOT exams are done in those facilities.   Whoever does the DOT is helping determine ability to drive.    We saw him for recent CPE and we can state that.   We have not really followed him regularly.  He may need to get a note from his nephrologist in support of his driving.

## 2020-01-19 NOTE — Telephone Encounter (Signed)
Pt is calling in stating that he needs a note stating that it is okay for the pt to have a DOT physical done at a urgent care (it needs to stated that he is okay to drive and is being followed by a physician in this facility).

## 2020-01-20 DIAGNOSIS — D631 Anemia in chronic kidney disease: Secondary | ICD-10-CM | POA: Diagnosis not present

## 2020-01-20 DIAGNOSIS — N2581 Secondary hyperparathyroidism of renal origin: Secondary | ICD-10-CM | POA: Diagnosis not present

## 2020-01-20 DIAGNOSIS — Z4932 Encounter for adequacy testing for peritoneal dialysis: Secondary | ICD-10-CM | POA: Diagnosis not present

## 2020-01-20 DIAGNOSIS — N186 End stage renal disease: Secondary | ICD-10-CM | POA: Diagnosis not present

## 2020-01-20 DIAGNOSIS — E440 Moderate protein-calorie malnutrition: Secondary | ICD-10-CM | POA: Diagnosis not present

## 2020-01-20 DIAGNOSIS — Z23 Encounter for immunization: Secondary | ICD-10-CM | POA: Diagnosis not present

## 2020-01-20 DIAGNOSIS — D509 Iron deficiency anemia, unspecified: Secondary | ICD-10-CM | POA: Diagnosis not present

## 2020-01-20 DIAGNOSIS — N2589 Other disorders resulting from impaired renal tubular function: Secondary | ICD-10-CM | POA: Diagnosis not present

## 2020-01-20 DIAGNOSIS — Z992 Dependence on renal dialysis: Secondary | ICD-10-CM | POA: Diagnosis not present

## 2020-01-20 DIAGNOSIS — Z79899 Other long term (current) drug therapy: Secondary | ICD-10-CM | POA: Diagnosis not present

## 2020-01-20 NOTE — Telephone Encounter (Signed)
Patient informed of the message below.

## 2020-01-21 DIAGNOSIS — N186 End stage renal disease: Secondary | ICD-10-CM | POA: Diagnosis not present

## 2020-01-21 DIAGNOSIS — Z992 Dependence on renal dialysis: Secondary | ICD-10-CM | POA: Diagnosis not present

## 2020-01-21 DIAGNOSIS — N2581 Secondary hyperparathyroidism of renal origin: Secondary | ICD-10-CM | POA: Diagnosis not present

## 2020-01-21 DIAGNOSIS — E440 Moderate protein-calorie malnutrition: Secondary | ICD-10-CM | POA: Diagnosis not present

## 2020-01-21 DIAGNOSIS — D631 Anemia in chronic kidney disease: Secondary | ICD-10-CM | POA: Diagnosis not present

## 2020-01-21 DIAGNOSIS — Z4932 Encounter for adequacy testing for peritoneal dialysis: Secondary | ICD-10-CM | POA: Diagnosis not present

## 2020-01-21 DIAGNOSIS — N2589 Other disorders resulting from impaired renal tubular function: Secondary | ICD-10-CM | POA: Diagnosis not present

## 2020-01-21 DIAGNOSIS — D509 Iron deficiency anemia, unspecified: Secondary | ICD-10-CM | POA: Diagnosis not present

## 2020-01-21 DIAGNOSIS — Z79899 Other long term (current) drug therapy: Secondary | ICD-10-CM | POA: Diagnosis not present

## 2020-01-21 DIAGNOSIS — Z23 Encounter for immunization: Secondary | ICD-10-CM | POA: Diagnosis not present

## 2020-01-22 DIAGNOSIS — D631 Anemia in chronic kidney disease: Secondary | ICD-10-CM | POA: Diagnosis not present

## 2020-01-22 DIAGNOSIS — N2589 Other disorders resulting from impaired renal tubular function: Secondary | ICD-10-CM | POA: Diagnosis not present

## 2020-01-22 DIAGNOSIS — N186 End stage renal disease: Secondary | ICD-10-CM | POA: Diagnosis not present

## 2020-01-22 DIAGNOSIS — N2581 Secondary hyperparathyroidism of renal origin: Secondary | ICD-10-CM | POA: Diagnosis not present

## 2020-01-22 DIAGNOSIS — D509 Iron deficiency anemia, unspecified: Secondary | ICD-10-CM | POA: Diagnosis not present

## 2020-01-22 DIAGNOSIS — Z992 Dependence on renal dialysis: Secondary | ICD-10-CM | POA: Diagnosis not present

## 2020-01-22 DIAGNOSIS — Z79899 Other long term (current) drug therapy: Secondary | ICD-10-CM | POA: Diagnosis not present

## 2020-01-22 DIAGNOSIS — Z4932 Encounter for adequacy testing for peritoneal dialysis: Secondary | ICD-10-CM | POA: Diagnosis not present

## 2020-01-22 DIAGNOSIS — Z23 Encounter for immunization: Secondary | ICD-10-CM | POA: Diagnosis not present

## 2020-01-22 DIAGNOSIS — E440 Moderate protein-calorie malnutrition: Secondary | ICD-10-CM | POA: Diagnosis not present

## 2020-01-23 DIAGNOSIS — Z4932 Encounter for adequacy testing for peritoneal dialysis: Secondary | ICD-10-CM | POA: Diagnosis not present

## 2020-01-23 DIAGNOSIS — N2581 Secondary hyperparathyroidism of renal origin: Secondary | ICD-10-CM | POA: Diagnosis not present

## 2020-01-23 DIAGNOSIS — Z79899 Other long term (current) drug therapy: Secondary | ICD-10-CM | POA: Diagnosis not present

## 2020-01-23 DIAGNOSIS — N2589 Other disorders resulting from impaired renal tubular function: Secondary | ICD-10-CM | POA: Diagnosis not present

## 2020-01-23 DIAGNOSIS — D631 Anemia in chronic kidney disease: Secondary | ICD-10-CM | POA: Diagnosis not present

## 2020-01-23 DIAGNOSIS — E440 Moderate protein-calorie malnutrition: Secondary | ICD-10-CM | POA: Diagnosis not present

## 2020-01-23 DIAGNOSIS — Z23 Encounter for immunization: Secondary | ICD-10-CM | POA: Diagnosis not present

## 2020-01-23 DIAGNOSIS — D509 Iron deficiency anemia, unspecified: Secondary | ICD-10-CM | POA: Diagnosis not present

## 2020-01-23 DIAGNOSIS — Z992 Dependence on renal dialysis: Secondary | ICD-10-CM | POA: Diagnosis not present

## 2020-01-23 DIAGNOSIS — N186 End stage renal disease: Secondary | ICD-10-CM | POA: Diagnosis not present

## 2020-01-24 DIAGNOSIS — Z4932 Encounter for adequacy testing for peritoneal dialysis: Secondary | ICD-10-CM | POA: Diagnosis not present

## 2020-01-24 DIAGNOSIS — N2589 Other disorders resulting from impaired renal tubular function: Secondary | ICD-10-CM | POA: Diagnosis not present

## 2020-01-24 DIAGNOSIS — D631 Anemia in chronic kidney disease: Secondary | ICD-10-CM | POA: Diagnosis not present

## 2020-01-24 DIAGNOSIS — N2581 Secondary hyperparathyroidism of renal origin: Secondary | ICD-10-CM | POA: Diagnosis not present

## 2020-01-24 DIAGNOSIS — D509 Iron deficiency anemia, unspecified: Secondary | ICD-10-CM | POA: Diagnosis not present

## 2020-01-24 DIAGNOSIS — E440 Moderate protein-calorie malnutrition: Secondary | ICD-10-CM | POA: Diagnosis not present

## 2020-01-24 DIAGNOSIS — N186 End stage renal disease: Secondary | ICD-10-CM | POA: Diagnosis not present

## 2020-01-24 DIAGNOSIS — Z79899 Other long term (current) drug therapy: Secondary | ICD-10-CM | POA: Diagnosis not present

## 2020-01-24 DIAGNOSIS — Z992 Dependence on renal dialysis: Secondary | ICD-10-CM | POA: Diagnosis not present

## 2020-01-24 DIAGNOSIS — Z23 Encounter for immunization: Secondary | ICD-10-CM | POA: Diagnosis not present

## 2020-01-25 DIAGNOSIS — N2581 Secondary hyperparathyroidism of renal origin: Secondary | ICD-10-CM | POA: Diagnosis not present

## 2020-01-25 DIAGNOSIS — D509 Iron deficiency anemia, unspecified: Secondary | ICD-10-CM | POA: Diagnosis not present

## 2020-01-25 DIAGNOSIS — Z23 Encounter for immunization: Secondary | ICD-10-CM | POA: Diagnosis not present

## 2020-01-25 DIAGNOSIS — Z992 Dependence on renal dialysis: Secondary | ICD-10-CM | POA: Diagnosis not present

## 2020-01-25 DIAGNOSIS — E440 Moderate protein-calorie malnutrition: Secondary | ICD-10-CM | POA: Diagnosis not present

## 2020-01-25 DIAGNOSIS — Z79899 Other long term (current) drug therapy: Secondary | ICD-10-CM | POA: Diagnosis not present

## 2020-01-25 DIAGNOSIS — D631 Anemia in chronic kidney disease: Secondary | ICD-10-CM | POA: Diagnosis not present

## 2020-01-25 DIAGNOSIS — Z4932 Encounter for adequacy testing for peritoneal dialysis: Secondary | ICD-10-CM | POA: Diagnosis not present

## 2020-01-25 DIAGNOSIS — N2589 Other disorders resulting from impaired renal tubular function: Secondary | ICD-10-CM | POA: Diagnosis not present

## 2020-01-25 DIAGNOSIS — N186 End stage renal disease: Secondary | ICD-10-CM | POA: Diagnosis not present

## 2020-01-26 DIAGNOSIS — Z4932 Encounter for adequacy testing for peritoneal dialysis: Secondary | ICD-10-CM | POA: Diagnosis not present

## 2020-01-26 DIAGNOSIS — Z23 Encounter for immunization: Secondary | ICD-10-CM | POA: Diagnosis not present

## 2020-01-26 DIAGNOSIS — N2581 Secondary hyperparathyroidism of renal origin: Secondary | ICD-10-CM | POA: Diagnosis not present

## 2020-01-26 DIAGNOSIS — Z79899 Other long term (current) drug therapy: Secondary | ICD-10-CM | POA: Diagnosis not present

## 2020-01-26 DIAGNOSIS — N2589 Other disorders resulting from impaired renal tubular function: Secondary | ICD-10-CM | POA: Diagnosis not present

## 2020-01-26 DIAGNOSIS — D631 Anemia in chronic kidney disease: Secondary | ICD-10-CM | POA: Diagnosis not present

## 2020-01-26 DIAGNOSIS — N186 End stage renal disease: Secondary | ICD-10-CM | POA: Diagnosis not present

## 2020-01-26 DIAGNOSIS — D509 Iron deficiency anemia, unspecified: Secondary | ICD-10-CM | POA: Diagnosis not present

## 2020-01-26 DIAGNOSIS — E440 Moderate protein-calorie malnutrition: Secondary | ICD-10-CM | POA: Diagnosis not present

## 2020-01-26 DIAGNOSIS — Z992 Dependence on renal dialysis: Secondary | ICD-10-CM | POA: Diagnosis not present

## 2020-01-27 DIAGNOSIS — N2589 Other disorders resulting from impaired renal tubular function: Secondary | ICD-10-CM | POA: Diagnosis not present

## 2020-01-27 DIAGNOSIS — D509 Iron deficiency anemia, unspecified: Secondary | ICD-10-CM | POA: Diagnosis not present

## 2020-01-27 DIAGNOSIS — E440 Moderate protein-calorie malnutrition: Secondary | ICD-10-CM | POA: Diagnosis not present

## 2020-01-27 DIAGNOSIS — Z4932 Encounter for adequacy testing for peritoneal dialysis: Secondary | ICD-10-CM | POA: Diagnosis not present

## 2020-01-27 DIAGNOSIS — D631 Anemia in chronic kidney disease: Secondary | ICD-10-CM | POA: Diagnosis not present

## 2020-01-27 DIAGNOSIS — N186 End stage renal disease: Secondary | ICD-10-CM | POA: Diagnosis not present

## 2020-01-27 DIAGNOSIS — Z992 Dependence on renal dialysis: Secondary | ICD-10-CM | POA: Diagnosis not present

## 2020-01-27 DIAGNOSIS — Z79899 Other long term (current) drug therapy: Secondary | ICD-10-CM | POA: Diagnosis not present

## 2020-01-27 DIAGNOSIS — N2581 Secondary hyperparathyroidism of renal origin: Secondary | ICD-10-CM | POA: Diagnosis not present

## 2020-01-27 DIAGNOSIS — Z23 Encounter for immunization: Secondary | ICD-10-CM | POA: Diagnosis not present

## 2020-01-28 DIAGNOSIS — Z4932 Encounter for adequacy testing for peritoneal dialysis: Secondary | ICD-10-CM | POA: Diagnosis not present

## 2020-01-28 DIAGNOSIS — D509 Iron deficiency anemia, unspecified: Secondary | ICD-10-CM | POA: Diagnosis not present

## 2020-01-28 DIAGNOSIS — N186 End stage renal disease: Secondary | ICD-10-CM | POA: Diagnosis not present

## 2020-01-28 DIAGNOSIS — E440 Moderate protein-calorie malnutrition: Secondary | ICD-10-CM | POA: Diagnosis not present

## 2020-01-28 DIAGNOSIS — Z992 Dependence on renal dialysis: Secondary | ICD-10-CM | POA: Diagnosis not present

## 2020-01-28 DIAGNOSIS — N2581 Secondary hyperparathyroidism of renal origin: Secondary | ICD-10-CM | POA: Diagnosis not present

## 2020-01-28 DIAGNOSIS — Z23 Encounter for immunization: Secondary | ICD-10-CM | POA: Diagnosis not present

## 2020-01-28 DIAGNOSIS — D631 Anemia in chronic kidney disease: Secondary | ICD-10-CM | POA: Diagnosis not present

## 2020-01-28 DIAGNOSIS — Z79899 Other long term (current) drug therapy: Secondary | ICD-10-CM | POA: Diagnosis not present

## 2020-01-28 DIAGNOSIS — N2589 Other disorders resulting from impaired renal tubular function: Secondary | ICD-10-CM | POA: Diagnosis not present

## 2020-01-29 DIAGNOSIS — N2581 Secondary hyperparathyroidism of renal origin: Secondary | ICD-10-CM | POA: Diagnosis not present

## 2020-01-29 DIAGNOSIS — E440 Moderate protein-calorie malnutrition: Secondary | ICD-10-CM | POA: Diagnosis not present

## 2020-01-29 DIAGNOSIS — N2589 Other disorders resulting from impaired renal tubular function: Secondary | ICD-10-CM | POA: Diagnosis not present

## 2020-01-29 DIAGNOSIS — D631 Anemia in chronic kidney disease: Secondary | ICD-10-CM | POA: Diagnosis not present

## 2020-01-29 DIAGNOSIS — Z23 Encounter for immunization: Secondary | ICD-10-CM | POA: Diagnosis not present

## 2020-01-29 DIAGNOSIS — Z79899 Other long term (current) drug therapy: Secondary | ICD-10-CM | POA: Diagnosis not present

## 2020-01-29 DIAGNOSIS — N186 End stage renal disease: Secondary | ICD-10-CM | POA: Diagnosis not present

## 2020-01-29 DIAGNOSIS — D509 Iron deficiency anemia, unspecified: Secondary | ICD-10-CM | POA: Diagnosis not present

## 2020-01-29 DIAGNOSIS — Z992 Dependence on renal dialysis: Secondary | ICD-10-CM | POA: Diagnosis not present

## 2020-01-29 DIAGNOSIS — Z4932 Encounter for adequacy testing for peritoneal dialysis: Secondary | ICD-10-CM | POA: Diagnosis not present

## 2020-01-30 DIAGNOSIS — Z79899 Other long term (current) drug therapy: Secondary | ICD-10-CM | POA: Diagnosis not present

## 2020-01-30 DIAGNOSIS — E440 Moderate protein-calorie malnutrition: Secondary | ICD-10-CM | POA: Diagnosis not present

## 2020-01-30 DIAGNOSIS — N2581 Secondary hyperparathyroidism of renal origin: Secondary | ICD-10-CM | POA: Diagnosis not present

## 2020-01-30 DIAGNOSIS — Z992 Dependence on renal dialysis: Secondary | ICD-10-CM | POA: Diagnosis not present

## 2020-01-30 DIAGNOSIS — D509 Iron deficiency anemia, unspecified: Secondary | ICD-10-CM | POA: Diagnosis not present

## 2020-01-30 DIAGNOSIS — D631 Anemia in chronic kidney disease: Secondary | ICD-10-CM | POA: Diagnosis not present

## 2020-01-30 DIAGNOSIS — N2589 Other disorders resulting from impaired renal tubular function: Secondary | ICD-10-CM | POA: Diagnosis not present

## 2020-01-30 DIAGNOSIS — N186 End stage renal disease: Secondary | ICD-10-CM | POA: Diagnosis not present

## 2020-01-30 DIAGNOSIS — Z23 Encounter for immunization: Secondary | ICD-10-CM | POA: Diagnosis not present

## 2020-01-30 DIAGNOSIS — Z4932 Encounter for adequacy testing for peritoneal dialysis: Secondary | ICD-10-CM | POA: Diagnosis not present

## 2020-01-31 DIAGNOSIS — D509 Iron deficiency anemia, unspecified: Secondary | ICD-10-CM | POA: Diagnosis not present

## 2020-01-31 DIAGNOSIS — D631 Anemia in chronic kidney disease: Secondary | ICD-10-CM | POA: Diagnosis not present

## 2020-01-31 DIAGNOSIS — E440 Moderate protein-calorie malnutrition: Secondary | ICD-10-CM | POA: Diagnosis not present

## 2020-01-31 DIAGNOSIS — Z79899 Other long term (current) drug therapy: Secondary | ICD-10-CM | POA: Diagnosis not present

## 2020-01-31 DIAGNOSIS — Z992 Dependence on renal dialysis: Secondary | ICD-10-CM | POA: Diagnosis not present

## 2020-01-31 DIAGNOSIS — Z23 Encounter for immunization: Secondary | ICD-10-CM | POA: Diagnosis not present

## 2020-01-31 DIAGNOSIS — N186 End stage renal disease: Secondary | ICD-10-CM | POA: Diagnosis not present

## 2020-01-31 DIAGNOSIS — N2581 Secondary hyperparathyroidism of renal origin: Secondary | ICD-10-CM | POA: Diagnosis not present

## 2020-01-31 DIAGNOSIS — Z4932 Encounter for adequacy testing for peritoneal dialysis: Secondary | ICD-10-CM | POA: Diagnosis not present

## 2020-01-31 DIAGNOSIS — N2589 Other disorders resulting from impaired renal tubular function: Secondary | ICD-10-CM | POA: Diagnosis not present

## 2020-02-01 ENCOUNTER — Emergency Department (HOSPITAL_COMMUNITY)
Admission: EM | Admit: 2020-02-01 | Discharge: 2020-02-02 | Disposition: A | Discharge status: Home / Self Care | Payer: Medicare Other | Attending: Emergency Medicine | Admitting: Emergency Medicine

## 2020-02-01 ENCOUNTER — Encounter (HOSPITAL_COMMUNITY): Payer: Self-pay

## 2020-02-01 DIAGNOSIS — Z23 Encounter for immunization: Secondary | ICD-10-CM | POA: Diagnosis not present

## 2020-02-01 DIAGNOSIS — K6289 Other specified diseases of anus and rectum: Secondary | ICD-10-CM | POA: Insufficient documentation

## 2020-02-01 DIAGNOSIS — N186 End stage renal disease: Secondary | ICD-10-CM | POA: Insufficient documentation

## 2020-02-01 DIAGNOSIS — E440 Moderate protein-calorie malnutrition: Secondary | ICD-10-CM | POA: Diagnosis not present

## 2020-02-01 DIAGNOSIS — D631 Anemia in chronic kidney disease: Secondary | ICD-10-CM | POA: Diagnosis not present

## 2020-02-01 DIAGNOSIS — Z7901 Long term (current) use of anticoagulants: Secondary | ICD-10-CM | POA: Diagnosis not present

## 2020-02-01 DIAGNOSIS — F17210 Nicotine dependence, cigarettes, uncomplicated: Secondary | ICD-10-CM | POA: Insufficient documentation

## 2020-02-01 DIAGNOSIS — D509 Iron deficiency anemia, unspecified: Secondary | ICD-10-CM | POA: Diagnosis not present

## 2020-02-01 DIAGNOSIS — I120 Hypertensive chronic kidney disease with stage 5 chronic kidney disease or end stage renal disease: Secondary | ICD-10-CM | POA: Diagnosis not present

## 2020-02-01 DIAGNOSIS — K648 Other hemorrhoids: Secondary | ICD-10-CM | POA: Insufficient documentation

## 2020-02-01 DIAGNOSIS — N2589 Other disorders resulting from impaired renal tubular function: Secondary | ICD-10-CM | POA: Diagnosis not present

## 2020-02-01 DIAGNOSIS — Z79899 Other long term (current) drug therapy: Secondary | ICD-10-CM | POA: Diagnosis not present

## 2020-02-01 DIAGNOSIS — Z4932 Encounter for adequacy testing for peritoneal dialysis: Secondary | ICD-10-CM | POA: Diagnosis not present

## 2020-02-01 DIAGNOSIS — Z992 Dependence on renal dialysis: Secondary | ICD-10-CM | POA: Diagnosis not present

## 2020-02-01 DIAGNOSIS — N2581 Secondary hyperparathyroidism of renal origin: Secondary | ICD-10-CM | POA: Diagnosis not present

## 2020-02-01 NOTE — ED Triage Notes (Signed)
Pt reports that he has had a hemorrhoid for the past week, internal, very painful, and bleeding, reports heavy lifting at work

## 2020-02-02 ENCOUNTER — Telehealth: Payer: Self-pay | Admitting: *Deleted

## 2020-02-02 DIAGNOSIS — E440 Moderate protein-calorie malnutrition: Secondary | ICD-10-CM | POA: Diagnosis not present

## 2020-02-02 DIAGNOSIS — D631 Anemia in chronic kidney disease: Secondary | ICD-10-CM | POA: Diagnosis not present

## 2020-02-02 DIAGNOSIS — Z23 Encounter for immunization: Secondary | ICD-10-CM | POA: Diagnosis not present

## 2020-02-02 DIAGNOSIS — D509 Iron deficiency anemia, unspecified: Secondary | ICD-10-CM | POA: Diagnosis not present

## 2020-02-02 DIAGNOSIS — Z992 Dependence on renal dialysis: Secondary | ICD-10-CM | POA: Diagnosis not present

## 2020-02-02 DIAGNOSIS — Z4932 Encounter for adequacy testing for peritoneal dialysis: Secondary | ICD-10-CM | POA: Diagnosis not present

## 2020-02-02 DIAGNOSIS — N2581 Secondary hyperparathyroidism of renal origin: Secondary | ICD-10-CM | POA: Diagnosis not present

## 2020-02-02 DIAGNOSIS — N186 End stage renal disease: Secondary | ICD-10-CM | POA: Diagnosis not present

## 2020-02-02 DIAGNOSIS — N2589 Other disorders resulting from impaired renal tubular function: Secondary | ICD-10-CM | POA: Diagnosis not present

## 2020-02-02 DIAGNOSIS — Z79899 Other long term (current) drug therapy: Secondary | ICD-10-CM | POA: Diagnosis not present

## 2020-02-02 MED ORDER — POLYETHYLENE GLYCOL 3350 17 GM/SCOOP PO POWD: 17.0000 g | Freq: Two times a day (BID) | ORAL | 0 refills | Status: AC

## 2020-02-02 MED ORDER — LIDOCAINE 0.5 % EX GEL: CUTANEOUS | 0 refills | Status: AC

## 2020-02-02 MED ORDER — DOCUSATE SODIUM 100 MG PO CAPS: 100.0000 mg | ORAL_CAPSULE | Freq: Two times a day (BID) | ORAL | 0 refills | Status: AC

## 2020-02-02 NOTE — ED Provider Notes (Signed)
[facility] EMERGENCY DEPARTMENT Provider Note   CSN: [id number] Arrival date & time: 02/01/20  2008     History Chief Complaint  Patient presents with  . Hemorrhoids    Cory Dixon is a 34 y.o. male.  Patient presents to the emergency department with a chief complaint of rectal pain.  He believes that he has a hemorrhoid.  He states that he has been using hemorrhoidal cream with lidocaine with some relief.  He states that he has been having pain when he has bowel movement.  He denies any fever or chills.  States he has history of the same.  He reports having some rectal bleeding.  He reports that he lifts heavy boxes at work.  The history is provided by the patient. No language interpreter was used.       Past Medical History:  Diagnosis Date  . Chicken pox   . CKD (chronic kidney disease), stage V ([place])   . Headache(784.0)   . Hyperkalemia 03/2017  . Hypertension   . Migraine   . Peritoneal dialysis status [facility])     Patient Active Problem List   Diagnosis Date Noted  . Smoking 04/02/2017  . History of atrial fibrillation 04/02/2017  . ESRD (end stage renal disease) ([place]) 03/20/2017  . Acute hyperkalemia 03/19/2017  . CKD (chronic kidney disease) stage V requiring chronic dialysis ([place]) 03/19/2017  . HTN (hypertension) 03/19/2017  . Abnormal EKG 03/19/2017  . Hypertension 12/30/2010  . Migraine headache 12/30/2010    Past Surgical History:  Procedure Laterality Date  . DIALYSIS FISTULA CREATION         Family History  Problem Relation Age of Onset  . Arthritis Mother   . Arthritis Maternal Grandmother   . Cancer Maternal Grandmother        breast  . Hypertension Maternal Grandmother   . Depression Maternal Grandmother   . Hypertension Maternal Grandfather   . Hypertension Paternal Grandmother   . Depression Paternal Grandmother   . Hypertension Paternal Grandfather   . Diabetes Brother        type 1 diabetes    Social  History   Tobacco Use  . Smoking status: Current Every Day Smoker    Packs/day: 0.20    Years: 8.00    Pack years: 1.60    Types: Cigarettes  . Smokeless tobacco: Never Used  Vaping Use  . Vaping Use: Never used  Substance Use Topics  . Alcohol use: No  . Drug use: No    Home Medications Prior to Admission medications   Medication Sig Start Date End Date Taking? Authorizing Provider  AURYXIA 1 GM 210 MG(Fe) tablet Take 630 mg by mouth 3 (three) times daily as needed. 01/02/20   [provider]  B Complex-C-Folic Acid (DIALYVITE 245) 0.8 MG TABS Take 1 tablet by mouth daily. 12/09/19   [provider]  calcitRIOL (ROCALTROL) 0.25 MCG capsule Take by mouth. 11/15/18   [provider]  Cholecalciferol 25 MCG (1000 UT) tablet Take by mouth.    [provider]  cinacalcet (SENSIPAR) 60 MG tablet Take by mouth. 11/15/18   [provider]  docusate sodium (COLACE) 100 MG capsule Take 1 capsule (100 mg total) by mouth every 12 (twelve) hours. 02/02/20   Montine Circle, PA-C  furosemide (LASIX) 80 MG tablet Take 80 mg by mouth daily. 09/08/19   [provider]  gentamicin cream (GARAMYCIN) 0.1 % Apply topically. 04/14/18   [provider]  Lidocaine 0.5 % GEL Apply around the rectum, but NOT in the rectum. 02/02/20   Montine Circle, PA-C  lisinopril (PRINIVIL,ZESTRIL) 10 MG tablet Take 10 mg by mouth daily.    [provider]  polyethylene glycol powder (GLYCOLAX/MIRALAX) 17 GM/SCOOP powder Take 17 g by mouth 2 (two) times daily. 02/02/20   Montine Circle, PA-C  pramoxine (SARNA SENSITIVE) 1 % LOTN Apply 1 application topically 2 (two) times daily. Patient not taking: Reported on 01/10/2020 10/24/17   Ezequiel Essex, MD    Allergies    Nicotine and Percocet [oxycodone-acetaminophen]  Review of Systems   Review of Systems  All other systems reviewed and are negative.   Physical Exam Updated Vital Signs BP 135/80  (BP Location: Right Arm)   Pulse 80   Temp 98.7 F (37.1 C) (Oral)   Resp 18   SpO2 100%   Physical Exam Vitals and nursing note reviewed.  Constitutional:      General: He is not in acute distress.    Appearance: He is well-developed. He is not ill-appearing.  HENT:     Head: Normocephalic and atraumatic.  Eyes:     Conjunctiva/sclera: Conjunctivae normal.  Cardiovascular:     Rate and Rhythm: Normal rate.  Pulmonary:     Effort: Pulmonary effort is normal. No respiratory distress.  Abdominal:     General: There is no distension.  Genitourinary:    Comments: Small palpable internal hemorrhoid, external skin tag present presumably from prior hemorrhoid, no evidence of abscess, no redness, no erythema, no hemorrhage, no visible fissure Musculoskeletal:     Cervical back: Neck supple.     Comments: Moves all extremities  Skin:    General: Skin is warm and dry.  Neurological:     Mental Status: He is alert and oriented to person, place, and time.  Psychiatric:        Mood and Affect: Mood normal.        Behavior: Behavior normal.   Chaperone present  ED Results / Procedures / Treatments   Labs (all labs ordered are listed, but only abnormal results are displayed) Labs Reviewed - No data to display  EKG None  Radiology No results found.  Procedures Procedures (including critical care time)  Medications Ordered in ED Medications - No data to display  ED Course  I have reviewed the triage vital signs and the nursing notes.  Pertinent labs & imaging results that were available during my care of the patient were reviewed by me and considered in my medical decision making (see chart for details).    MDM Rules/Calculators/A&P                          Patient with small internal, nearly external hemorrhoid.  Painful with palpation.  Vital signs are stable.  No evidence of rectal abscess.  Will treat with stool softeners.  Encourage patient to continue sitz bath and  resting.  Discussed the fact that the hemorrhoid will likely be present for a couple of weeks.  Return precautions discussed.  Patient given GI follow-up. Final Clinical Impression(s) / ED Diagnoses Final diagnoses:  Rectal pain    Rx / DC Orders ED Discharge Orders         Ordered    docusate sodium (COLACE) 100 MG capsule  Every 12 hours        02/02/20 0015    polyethylene glycol powder (GLYCOLAX/MIRALAX) 17 GM/SCOOP powder  2 times  daily        02/02/20 0015    Lidocaine 0.5 % GEL        02/02/20 0015           Montine Circle, PA-C 02/02/20 Victorino Dike, April, MD 02/02/20 [id number]

## 2020-02-02 NOTE — Telephone Encounter (Signed)
Pharmacy called related to Rx: Lidocaine 0.5% gel not being available .Marland KitchenMarland KitchenEDCM clarified with EDP to change Rx to: ointment.

## 2020-02-03 DIAGNOSIS — Z79899 Other long term (current) drug therapy: Secondary | ICD-10-CM | POA: Diagnosis not present

## 2020-02-03 DIAGNOSIS — N186 End stage renal disease: Secondary | ICD-10-CM | POA: Diagnosis not present

## 2020-02-03 DIAGNOSIS — D509 Iron deficiency anemia, unspecified: Secondary | ICD-10-CM | POA: Diagnosis not present

## 2020-02-03 DIAGNOSIS — Z4932 Encounter for adequacy testing for peritoneal dialysis: Secondary | ICD-10-CM | POA: Diagnosis not present

## 2020-02-03 DIAGNOSIS — N2589 Other disorders resulting from impaired renal tubular function: Secondary | ICD-10-CM | POA: Diagnosis not present

## 2020-02-03 DIAGNOSIS — D631 Anemia in chronic kidney disease: Secondary | ICD-10-CM | POA: Diagnosis not present

## 2020-02-03 DIAGNOSIS — Z23 Encounter for immunization: Secondary | ICD-10-CM | POA: Diagnosis not present

## 2020-02-03 DIAGNOSIS — E440 Moderate protein-calorie malnutrition: Secondary | ICD-10-CM | POA: Diagnosis not present

## 2020-02-03 DIAGNOSIS — N2581 Secondary hyperparathyroidism of renal origin: Secondary | ICD-10-CM | POA: Diagnosis not present

## 2020-02-03 DIAGNOSIS — Z992 Dependence on renal dialysis: Secondary | ICD-10-CM | POA: Diagnosis not present

## 2020-02-04 DIAGNOSIS — N2581 Secondary hyperparathyroidism of renal origin: Secondary | ICD-10-CM | POA: Diagnosis not present

## 2020-02-04 DIAGNOSIS — E440 Moderate protein-calorie malnutrition: Secondary | ICD-10-CM | POA: Diagnosis not present

## 2020-02-04 DIAGNOSIS — Z23 Encounter for immunization: Secondary | ICD-10-CM | POA: Diagnosis not present

## 2020-02-04 DIAGNOSIS — N2589 Other disorders resulting from impaired renal tubular function: Secondary | ICD-10-CM | POA: Diagnosis not present

## 2020-02-04 DIAGNOSIS — D631 Anemia in chronic kidney disease: Secondary | ICD-10-CM | POA: Diagnosis not present

## 2020-02-04 DIAGNOSIS — D509 Iron deficiency anemia, unspecified: Secondary | ICD-10-CM | POA: Diagnosis not present

## 2020-02-04 DIAGNOSIS — N186 End stage renal disease: Secondary | ICD-10-CM | POA: Diagnosis not present

## 2020-02-04 DIAGNOSIS — Z992 Dependence on renal dialysis: Secondary | ICD-10-CM | POA: Diagnosis not present

## 2020-02-04 DIAGNOSIS — Z4932 Encounter for adequacy testing for peritoneal dialysis: Secondary | ICD-10-CM | POA: Diagnosis not present

## 2020-02-04 DIAGNOSIS — Z79899 Other long term (current) drug therapy: Secondary | ICD-10-CM | POA: Diagnosis not present

## 2020-02-05 DIAGNOSIS — N2589 Other disorders resulting from impaired renal tubular function: Secondary | ICD-10-CM | POA: Diagnosis not present

## 2020-02-05 DIAGNOSIS — N2581 Secondary hyperparathyroidism of renal origin: Secondary | ICD-10-CM | POA: Diagnosis not present

## 2020-02-05 DIAGNOSIS — Z79899 Other long term (current) drug therapy: Secondary | ICD-10-CM | POA: Diagnosis not present

## 2020-02-05 DIAGNOSIS — E440 Moderate protein-calorie malnutrition: Secondary | ICD-10-CM | POA: Diagnosis not present

## 2020-02-05 DIAGNOSIS — Z4932 Encounter for adequacy testing for peritoneal dialysis: Secondary | ICD-10-CM | POA: Diagnosis not present

## 2020-02-05 DIAGNOSIS — N186 End stage renal disease: Secondary | ICD-10-CM | POA: Diagnosis not present

## 2020-02-05 DIAGNOSIS — D631 Anemia in chronic kidney disease: Secondary | ICD-10-CM | POA: Diagnosis not present

## 2020-02-05 DIAGNOSIS — Z992 Dependence on renal dialysis: Secondary | ICD-10-CM | POA: Diagnosis not present

## 2020-02-05 DIAGNOSIS — I129 Hypertensive chronic kidney disease with stage 1 through stage 4 chronic kidney disease, or unspecified chronic kidney disease: Secondary | ICD-10-CM | POA: Diagnosis not present

## 2020-02-05 DIAGNOSIS — D509 Iron deficiency anemia, unspecified: Secondary | ICD-10-CM | POA: Diagnosis not present

## 2020-02-05 DIAGNOSIS — Z23 Encounter for immunization: Secondary | ICD-10-CM | POA: Diagnosis not present

## 2020-02-06 DIAGNOSIS — D631 Anemia in chronic kidney disease: Secondary | ICD-10-CM | POA: Diagnosis not present

## 2020-02-06 DIAGNOSIS — Z992 Dependence on renal dialysis: Secondary | ICD-10-CM | POA: Diagnosis not present

## 2020-02-06 DIAGNOSIS — N2581 Secondary hyperparathyroidism of renal origin: Secondary | ICD-10-CM | POA: Diagnosis not present

## 2020-02-06 DIAGNOSIS — N186 End stage renal disease: Secondary | ICD-10-CM | POA: Diagnosis not present

## 2020-02-07 DIAGNOSIS — N186 End stage renal disease: Secondary | ICD-10-CM | POA: Diagnosis not present

## 2020-02-07 DIAGNOSIS — N2581 Secondary hyperparathyroidism of renal origin: Secondary | ICD-10-CM | POA: Diagnosis not present

## 2020-02-07 DIAGNOSIS — D631 Anemia in chronic kidney disease: Secondary | ICD-10-CM | POA: Diagnosis not present

## 2020-02-07 DIAGNOSIS — Z992 Dependence on renal dialysis: Secondary | ICD-10-CM | POA: Diagnosis not present

## 2020-02-08 ENCOUNTER — Ambulatory Visit (HOSPITAL_COMMUNITY)
Admission: EM | Admit: 2020-02-08 | Discharge: 2020-02-08 | Disposition: A | Discharge status: Home / Self Care | Payer: Medicare Other | Attending: Emergency Medicine | Admitting: Emergency Medicine

## 2020-02-08 ENCOUNTER — Encounter (HOSPITAL_COMMUNITY): Payer: Self-pay

## 2020-02-08 ENCOUNTER — Other Ambulatory Visit: Payer: Self-pay

## 2020-02-08 DIAGNOSIS — D631 Anemia in chronic kidney disease: Secondary | ICD-10-CM | POA: Diagnosis not present

## 2020-02-08 DIAGNOSIS — K6289 Other specified diseases of anus and rectum: Secondary | ICD-10-CM | POA: Diagnosis present

## 2020-02-08 DIAGNOSIS — N186 End stage renal disease: Secondary | ICD-10-CM | POA: Diagnosis not present

## 2020-02-08 DIAGNOSIS — K5900 Constipation, unspecified: Secondary | ICD-10-CM | POA: Diagnosis not present

## 2020-02-08 DIAGNOSIS — M545 Low back pain, unspecified: Secondary | ICD-10-CM | POA: Diagnosis not present

## 2020-02-08 DIAGNOSIS — N2581 Secondary hyperparathyroidism of renal origin: Secondary | ICD-10-CM | POA: Diagnosis not present

## 2020-02-08 DIAGNOSIS — Z992 Dependence on renal dialysis: Secondary | ICD-10-CM | POA: Diagnosis not present

## 2020-02-08 MED ORDER — HYDROCORTISONE ACETATE 25 MG RE SUPP: 25.0000 mg | Freq: Two times a day (BID) | RECTAL | 0 refills | Status: AC | PRN

## 2020-02-08 NOTE — ED Notes (Signed)
At bedside for jeanette, pa exam of patient

## 2020-02-08 NOTE — ED Triage Notes (Signed)
Pt presents with possibly and internal hemmorhoid that he has been dealing with for over 2 weeks: pt states he has not had any relief with OTC and prescribed medication and ointments. Pt states he has an appt with gastro but it is not until December.

## 2020-02-08 NOTE — Discharge Instructions (Addendum)
Continue miralax as discussed,stool softener as well, adding Anusol HC suppositories. Follow up with GI as scheduled. Avoid straining,heavy lifting as it makes hemorrhoids worse. Also makes back pain worse. Follow up with PCP.

## 2020-02-08 NOTE — ED Provider Notes (Signed)
[place]    CSN: [id number] Arrival date & time: 02/08/20  1554      History   Chief Complaint Chief Complaint  Patient presents with  . Hemorrhoids    HPI Cory Dixon is a 34 y.o. male.   34 yr old male pt presents to Er with cc of hemorrhoid issues x 2 weeks,seen recently at ER and treated with lidocaine for pain, tucks, prep h ointment, has also taken Miralax without relief. Pt is peritoneal dialysis pt as well. Works at [employer] lots of straining and lifting. Pt reports constipation and straining with BM as well. Has follow up with GI in December but reports that is too long. Also endorses back pain with straining,no loss of bowel and bladder, no saddle numbness.  The history is provided by the patient. No language interpreter was used.    Past Medical History:  Diagnosis Date  . Chicken pox   . CKD (chronic kidney disease), stage V ([place])   . Headache(784.0)   . Hyperkalemia 03/2017  . Hypertension   . Migraine   . Peritoneal dialysis status [facility])     Patient Active Problem List   Diagnosis Date Noted  . Pain, rectal 02/08/2020  . Constipation 02/08/2020  . Smoking 04/02/2017  . History of atrial fibrillation 04/02/2017  . ESRD (end stage renal disease) ([place]) 03/20/2017  . Acute hyperkalemia 03/19/2017  . CKD (chronic kidney disease) stage V requiring chronic dialysis ([place]) 03/19/2017  . HTN (hypertension) 03/19/2017  . Abnormal EKG 03/19/2017  . Hypertension 12/30/2010  . Migraine headache 12/30/2010    Past Surgical History:  Procedure Laterality Date  . DIALYSIS FISTULA CREATION         Home Medications    Prior to Admission medications   Medication Sig Start Date End Date Taking? Authorizing Provider  AURYXIA 1 GM 210 MG(Fe) tablet Take 630 mg by mouth 3 (three) times daily as needed. 01/02/20   [provider]  B Complex-C-Folic Acid (DIALYVITE 932) 0.8 MG TABS Take 1 tablet by mouth daily. 12/09/19   [provider]   calcitRIOL (ROCALTROL) 0.25 MCG capsule Take by mouth. 11/15/18   [provider]  Cholecalciferol 25 MCG (1000 UT) tablet Take by mouth.    [provider]  cinacalcet (SENSIPAR) 60 MG tablet Take by mouth. 11/15/18   [provider]  docusate sodium (COLACE) 100 MG capsule Take 1 capsule (100 mg total) by mouth every 12 (twelve) hours. 02/02/20   Montine Circle, PA-C  furosemide (LASIX) 80 MG tablet Take 80 mg by mouth daily. 09/08/19   [provider]  gentamicin cream (GARAMYCIN) 0.1 % Apply topically. 04/14/18   [provider]  hydrocortisone (ANUCORT-HC) 25 MG suppository Place 1 suppository (25 mg total) rectally 2 (two) times daily as needed for up to 12 doses for hemorrhoids or anal itching. 67/1/24   Layne Lebon, Jeanett Schlein, NP  Lidocaine 0.5 % GEL Apply around the rectum, but NOT in the rectum. 02/02/20   Montine Circle, PA-C  lisinopril (PRINIVIL,ZESTRIL) 10 MG tablet Take 10 mg by mouth daily.    [provider]  polyethylene glycol powder (GLYCOLAX/MIRALAX) 17 GM/SCOOP powder Take 17 g by mouth 2 (two) times daily. 02/02/20   Montine Circle, PA-C  pramoxine (SARNA SENSITIVE) 1 % LOTN Apply 1 application topically 2 (two) times daily. Patient not taking: Reported on 01/10/2020 10/24/17   Ezequiel Essex, MD    Family History Family History  Problem Relation Age of Onset  .  Arthritis Mother   . Arthritis Maternal Grandmother   . Cancer Maternal Grandmother        breast  . Hypertension Maternal Grandmother   . Depression Maternal Grandmother   . Hypertension Maternal Grandfather   . Hypertension Paternal Grandmother   . Depression Paternal Grandmother   . Hypertension Paternal Grandfather   . Diabetes Brother        type 1 diabetes    Social History Social History   Tobacco Use  . Smoking status: Current Every Day Smoker    Packs/day: 0.20    Years: 8.00    Pack years: 1.60    Types: Cigarettes  . Smokeless  tobacco: Never Used  Vaping Use  . Vaping Use: Never used  Substance Use Topics  . Alcohol use: No  . Drug use: No     Allergies   Nicotine and Percocet [oxycodone-acetaminophen]   Review of Systems Review of Systems  Gastrointestinal: Positive for constipation and rectal pain.  All other systems reviewed and are negative.    Physical Exam Triage Vital Signs ED Triage Vitals  Enc Vitals Group     BP 02/08/20 1746 (!) 146/87     Pulse Rate 02/08/20 1746 71     Resp 02/08/20 1746 18     Temp 02/08/20 1746 98.6 F (37 C)     Temp Source 02/08/20 1746 Oral     SpO2 02/08/20 1746 99 %     Weight --      Height --      Head Circumference --      Peak Flow --      Pain Score 02/08/20 1745 9     Pain Loc --      Pain Edu? --      Excl. in [place]? --    No data found.  Updated Vital Signs BP (!) 146/87 (BP Location: Left Arm)   Pulse 71   Temp 98.6 F (37 C) (Oral)   Resp 18   SpO2 99%   Physical Exam Vitals and nursing note reviewed. Exam conducted with a chaperone present.  Abdominal:     General: Abdomen is flat.     Palpations: Abdomen is soft.  Genitourinary:    Comments: No external or internal hemorrhoids appreciated with exam today. Rectal tone intact as well. Neurological:     General: No focal deficit present.     Mental Status: He is alert and oriented to person, place, and time.     GCS: GCS eye subscore is 4. GCS verbal subscore is 5. GCS motor subscore is 6.  Psychiatric:        Mood and Affect: Mood normal.        Behavior: Behavior normal. Behavior is cooperative.      UC Treatments / Results  Labs (all labs ordered are listed, but only abnormal results are displayed) Labs Reviewed - No data to display  EKG   Radiology No results found.  Procedures Procedures (including critical care time)  Medications Ordered in UC Medications - No data to display  Initial Impression / Assessment and Plan / UC Course  I have reviewed the triage  vital signs and the nursing notes.  Pertinent labs & imaging results that were available during my care of the patient were reviewed by me and considered in my medical decision making (see chart for details).      Final Clinical Impressions(s) / UC Diagnoses   Final diagnoses:  Pain, rectal  Constipation, unspecified constipation type  Acute low back pain without sciatica, unspecified back pain laterality     Discharge Instructions     Continue miralax as discussed,stool softener as well, adding Anusol HC suppositories. Follow up with GI as scheduled. Avoid straining,heavy lifting as it makes hemorrhoids worse. Also makes back pain worse. Follow up with PCP.    ED Prescriptions    Medication Sig Dispense Auth. Provider   hydrocortisone (ANUCORT-HC) 25 MG suppository Place 1 suppository (25 mg total) rectally 2 (two) times daily as needed for up to 12 doses for hemorrhoids or anal itching. 12 suppository Orene Abbasi, Jeanett Schlein, NP     PDMP not reviewed this encounter.   Tori Milks, NP 38/88/28 1847

## 2020-02-09 DIAGNOSIS — N2581 Secondary hyperparathyroidism of renal origin: Secondary | ICD-10-CM | POA: Diagnosis not present

## 2020-02-09 DIAGNOSIS — N186 End stage renal disease: Secondary | ICD-10-CM | POA: Diagnosis not present

## 2020-02-09 DIAGNOSIS — Z992 Dependence on renal dialysis: Secondary | ICD-10-CM | POA: Diagnosis not present

## 2020-02-09 DIAGNOSIS — D631 Anemia in chronic kidney disease: Secondary | ICD-10-CM | POA: Diagnosis not present

## 2020-02-10 DIAGNOSIS — N2581 Secondary hyperparathyroidism of renal origin: Secondary | ICD-10-CM | POA: Diagnosis not present

## 2020-02-10 DIAGNOSIS — D631 Anemia in chronic kidney disease: Secondary | ICD-10-CM | POA: Diagnosis not present

## 2020-02-10 DIAGNOSIS — Z992 Dependence on renal dialysis: Secondary | ICD-10-CM | POA: Diagnosis not present

## 2020-02-10 DIAGNOSIS — N186 End stage renal disease: Secondary | ICD-10-CM | POA: Diagnosis not present

## 2020-02-11 DIAGNOSIS — Z992 Dependence on renal dialysis: Secondary | ICD-10-CM | POA: Diagnosis not present

## 2020-02-11 DIAGNOSIS — N2581 Secondary hyperparathyroidism of renal origin: Secondary | ICD-10-CM | POA: Diagnosis not present

## 2020-02-11 DIAGNOSIS — N186 End stage renal disease: Secondary | ICD-10-CM | POA: Diagnosis not present

## 2020-02-11 DIAGNOSIS — D631 Anemia in chronic kidney disease: Secondary | ICD-10-CM | POA: Diagnosis not present

## 2020-02-12 DIAGNOSIS — D631 Anemia in chronic kidney disease: Secondary | ICD-10-CM | POA: Diagnosis not present

## 2020-02-12 DIAGNOSIS — N2581 Secondary hyperparathyroidism of renal origin: Secondary | ICD-10-CM | POA: Diagnosis not present

## 2020-02-12 DIAGNOSIS — Z992 Dependence on renal dialysis: Secondary | ICD-10-CM | POA: Diagnosis not present

## 2020-02-12 DIAGNOSIS — N186 End stage renal disease: Secondary | ICD-10-CM | POA: Diagnosis not present

## 2020-02-13 DIAGNOSIS — Z992 Dependence on renal dialysis: Secondary | ICD-10-CM | POA: Diagnosis not present

## 2020-02-13 DIAGNOSIS — D631 Anemia in chronic kidney disease: Secondary | ICD-10-CM | POA: Diagnosis not present

## 2020-02-13 DIAGNOSIS — N2581 Secondary hyperparathyroidism of renal origin: Secondary | ICD-10-CM | POA: Diagnosis not present

## 2020-02-13 DIAGNOSIS — N186 End stage renal disease: Secondary | ICD-10-CM | POA: Diagnosis not present

## 2020-02-14 DIAGNOSIS — N2581 Secondary hyperparathyroidism of renal origin: Secondary | ICD-10-CM | POA: Diagnosis not present

## 2020-02-14 DIAGNOSIS — D631 Anemia in chronic kidney disease: Secondary | ICD-10-CM | POA: Diagnosis not present

## 2020-02-14 DIAGNOSIS — N186 End stage renal disease: Secondary | ICD-10-CM | POA: Diagnosis not present

## 2020-02-14 DIAGNOSIS — Z992 Dependence on renal dialysis: Secondary | ICD-10-CM | POA: Diagnosis not present

## 2020-02-15 DIAGNOSIS — N186 End stage renal disease: Secondary | ICD-10-CM | POA: Diagnosis not present

## 2020-02-15 DIAGNOSIS — Z992 Dependence on renal dialysis: Secondary | ICD-10-CM | POA: Diagnosis not present

## 2020-02-15 DIAGNOSIS — D631 Anemia in chronic kidney disease: Secondary | ICD-10-CM | POA: Diagnosis not present

## 2020-02-15 DIAGNOSIS — N2581 Secondary hyperparathyroidism of renal origin: Secondary | ICD-10-CM | POA: Diagnosis not present

## 2020-02-16 DIAGNOSIS — N186 End stage renal disease: Secondary | ICD-10-CM | POA: Diagnosis not present

## 2020-02-16 DIAGNOSIS — N2581 Secondary hyperparathyroidism of renal origin: Secondary | ICD-10-CM | POA: Diagnosis not present

## 2020-02-16 DIAGNOSIS — D631 Anemia in chronic kidney disease: Secondary | ICD-10-CM | POA: Diagnosis not present

## 2020-02-16 DIAGNOSIS — Z992 Dependence on renal dialysis: Secondary | ICD-10-CM | POA: Diagnosis not present

## 2020-02-17 DIAGNOSIS — N2581 Secondary hyperparathyroidism of renal origin: Secondary | ICD-10-CM | POA: Diagnosis not present

## 2020-02-17 DIAGNOSIS — N186 End stage renal disease: Secondary | ICD-10-CM | POA: Diagnosis not present

## 2020-02-17 DIAGNOSIS — Z992 Dependence on renal dialysis: Secondary | ICD-10-CM | POA: Diagnosis not present

## 2020-02-17 DIAGNOSIS — D631 Anemia in chronic kidney disease: Secondary | ICD-10-CM | POA: Diagnosis not present

## 2020-02-18 DIAGNOSIS — Z992 Dependence on renal dialysis: Secondary | ICD-10-CM | POA: Diagnosis not present

## 2020-02-18 DIAGNOSIS — D631 Anemia in chronic kidney disease: Secondary | ICD-10-CM | POA: Diagnosis not present

## 2020-02-18 DIAGNOSIS — N186 End stage renal disease: Secondary | ICD-10-CM | POA: Diagnosis not present

## 2020-02-18 DIAGNOSIS — N2581 Secondary hyperparathyroidism of renal origin: Secondary | ICD-10-CM | POA: Diagnosis not present

## 2020-02-19 DIAGNOSIS — D631 Anemia in chronic kidney disease: Secondary | ICD-10-CM | POA: Diagnosis not present

## 2020-02-19 DIAGNOSIS — N2581 Secondary hyperparathyroidism of renal origin: Secondary | ICD-10-CM | POA: Diagnosis not present

## 2020-02-19 DIAGNOSIS — Z992 Dependence on renal dialysis: Secondary | ICD-10-CM | POA: Diagnosis not present

## 2020-02-19 DIAGNOSIS — N186 End stage renal disease: Secondary | ICD-10-CM | POA: Diagnosis not present

## 2020-02-20 DIAGNOSIS — D631 Anemia in chronic kidney disease: Secondary | ICD-10-CM | POA: Diagnosis not present

## 2020-02-20 DIAGNOSIS — Z992 Dependence on renal dialysis: Secondary | ICD-10-CM | POA: Diagnosis not present

## 2020-02-20 DIAGNOSIS — N2581 Secondary hyperparathyroidism of renal origin: Secondary | ICD-10-CM | POA: Diagnosis not present

## 2020-02-20 DIAGNOSIS — N186 End stage renal disease: Secondary | ICD-10-CM | POA: Diagnosis not present

## 2020-02-21 ENCOUNTER — Ambulatory Visit (INDEPENDENT_AMBULATORY_CARE_PROVIDER_SITE_OTHER): Payer: Medicare Other | Admitting: Gastroenterology

## 2020-02-21 ENCOUNTER — Encounter: Payer: Self-pay | Admitting: Gastroenterology

## 2020-02-21 VITALS — BP 134/82 | HR 79 | Ht 71.0 in | Wt 183.0 lb

## 2020-02-21 DIAGNOSIS — Z992 Dependence on renal dialysis: Secondary | ICD-10-CM | POA: Diagnosis not present

## 2020-02-21 DIAGNOSIS — K6289 Other specified diseases of anus and rectum: Secondary | ICD-10-CM | POA: Diagnosis not present

## 2020-02-21 DIAGNOSIS — N2581 Secondary hyperparathyroidism of renal origin: Secondary | ICD-10-CM | POA: Diagnosis not present

## 2020-02-21 DIAGNOSIS — D631 Anemia in chronic kidney disease: Secondary | ICD-10-CM | POA: Diagnosis not present

## 2020-02-21 DIAGNOSIS — N186 End stage renal disease: Secondary | ICD-10-CM | POA: Diagnosis not present

## 2020-02-21 DIAGNOSIS — K625 Hemorrhage of anus and rectum: Secondary | ICD-10-CM

## 2020-02-21 MED ORDER — PLENVU 140 G PO SOLR: 1.0000 | ORAL | 0 refills | Status: AC

## 2020-02-21 NOTE — Progress Notes (Signed)
Referring Provider: [name], MD Primary Care Physician:  [name], MD  Reason for Consultation:  Rectal pain and bleeding   IMPRESSION:  Rectal pain and bleeding  Rectal pain and bleeding diagnosed as primary rectal syphilis by the health department. Symptoms are improvement with treatment. However, concurrent sources of rectal bleeding such as hemorrhoids, polyp, or mass must be excluded. Colonoscopy recommended. I asked him to contact me earlier if his symptoms recur prior to that time.    PLAN: Colonoscopy  Please see the "Patient Instructions" section for addition details about the plan.  HPI: Cory Dixon is a 34 y.o. male referred by the ED for rectal pain and bleeding. He has chronic kidney disease due to hypertension and has been on peritoneal dialysis since 2017.  He has been evaluated by [facility] renal transplant service and he is currently on[place] for a transplant because he continues to smoke. 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    ER/Urgent Care visits 10/27 and 11/3 for rectal pain with defecation in the setting of constipation and chronic straining.  Occasional rectal bleeding. Would pass mucous from the rectum without stool.  Associated urinary hesitancy.  No significant improvement with the recommended therapies including lidocaine, Tucks, Prep H ointment, Anusol HC suppositories, or Miralax.  He was seen at the Health Department last week because the pain was so severe he could not wait for this appointment. He was diagnosed with syphilis and his rectal pain has improved since starting IM penicillin lat week.    No prior history of GI symptoms except for an external hemorrhoid at age 45 that seemed to resolve.  No known family history of colon cancer or polyps. No family history of uterine/endometrial cancer, pancreatic cancer or gastric/stomach cancer. Paternal aunt with liver cancer who died at age 70.     Past Medical History:  Diagnosis Date   Chicken pox    CKD (chronic kidney disease), stage V ([facility])    Headache(784.0)    Hyperkalemia 03/2017   Hypertension    Migraine    Peritoneal dialysis status ([place])     Past Surgical History:  Procedure Laterality Date   DIALYSIS FISTULA CREATION      Current Outpatient Medications  Medication Sig Dispense Refill   AURYXIA 1 GM 210 MG(Fe) tablet Take 630 mg by mouth 3 (three) times daily as needed.     B Complex-C-Folic Acid (DIALYVITE 277) 0.8 MG TABS Take 1 tablet by mouth daily.     calcitRIOL (ROCALTROL) 0.25 MCG capsule Take by mouth.     Cholecalciferol 25 MCG (1000 UT) tablet Take by mouth.     cinacalcet (SENSIPAR) 60 MG tablet Take by mouth.     docusate sodium (COLACE) 100 MG capsule Take 1 capsule (100 mg total) by mouth every 12 (twelve) hours. 30 capsule 0   furosemide (LASIX) 80 MG tablet Take 80 mg by mouth daily.     gentamicin cream (GARAMYCIN) 0.1 % Apply topically.     hydrocortisone (ANUCORT-HC) 25 MG suppository Place 1 suppository (25 mg total) rectally 2 (two) times daily as needed for up to 12 doses for hemorrhoids or anal itching. 12 suppository 0   Lidocaine 0.5 % GEL Apply around the rectum, but NOT in the rectum. 170 g 0   lisinopril (PRINIVIL,ZESTRIL) 10 MG tablet Take 10 mg by mouth daily.     polyethylene glycol powder (GLYCOLAX/MIRALAX) 17 GM/SCOOP powder Take 17  g by mouth 2 (two) times daily. 255 g 0   pramoxine (SARNA SENSITIVE) 1 % LOTN Apply 1 application topically 2 (two) times daily. (Patient not taking: Reported on 01/10/2020) 1 Bottle 0   No current facility-administered medications for this visit.    Allergies as of 02/21/2020 - Review Complete 02/08/2020  Allergen Reaction Noted   Nicotine Other (See Comments) 03/11/2017   Percocet [oxycodone-acetaminophen] Itching and Other (See Comments) 03/11/2017    Family History  Problem Relation Age of Onset   Arthritis  Mother    Arthritis Maternal Grandmother    Cancer Maternal Grandmother        breast   Hypertension Maternal Grandmother    Depression Maternal Grandmother    Hypertension Maternal Grandfather    Hypertension Paternal Grandmother    Depression Paternal Grandmother    Hypertension Paternal Grandfather    Diabetes Brother        type 1 diabetes    Social History   Socioeconomic History   Marital status: Single    Spouse name: Not on file   Number of children: Not on file   Years of education: Not on file   Highest education level: Not on file  Occupational History   Not on file  Tobacco Use   Smoking status: Current Every Day Smoker    Packs/day: 0.20    Years: 8.00    Pack years: 1.60    Types: Cigarettes   Smokeless tobacco: Never Used  Scientific laboratory technician Use: Never used  Substance and Sexual Activity   Alcohol use: No   Drug use: No   Sexual activity: Not on file  Other Topics Concern   Not on file  Social History Narrative   Not on file   Social Determinants of Health   Financial Resource Strain:    Difficulty of Paying Living Expenses: Not on file  Food Insecurity:    Worried About Charity fundraiser in the Last Year: Not on file   [employer] of Food in the Last Year: Not on file  Transportation Needs:    Lack of Transportation (Medical): Not on file   Lack of Transportation (Non-Medical): Not on file  Physical Activity:    Days of Exercise per Week: Not on file   Minutes of Exercise per Session: Not on file  Stress:    Feeling of Stress : Not on file  Social Connections:    Frequency of Communication with Friends and Family: Not on file   Frequency of Social Gatherings with Friends and Family: Not on file   Attends Religious Services: Not on file   Active Member of Clubs or Organizations: Not on file   Attends Archivist Meetings: Not on file   Marital Status: Not on file  Intimate Partner Violence:     Fear of Current or Ex-Partner: Not on file   Emotionally Abused: Not on file   Physically Abused: Not on file   Sexually Abused: Not on file    Review of Systems: 12 system ROS is negative except as noted above.   Physical Exam: General:   Alert,  well-nourished, pleasant and cooperative in NAD Head:  Normocephalic and atraumatic. Eyes:  Sclera clear, no icterus.   Conjunctiva pink. Ears:  Normal auditory acuity. Nose:  No deformity, discharge,  or lesions. Mouth:  No deformity or lesions.   Neck:  Supple; no masses or thyromegaly. Lungs:  Clear throughout to auscultation.   No wheezes.  Heart:  Regular rate and rhythm; no murmurs. Abdomen:  Soft, thin, peritoneal dialysis catheter with overlying bandage present in the left abdomen, nontender, nondistended, normal bowel sounds, no rebound or guarding. No hepatosplenomegaly.   Rectal:   Deferred to time of upcoming colonoscopy  Msk:  Symmetrical. No boney deformities LAD: No inguinal or umbilical LAD Extremities:  No clubbing or edema. Neurologic:  Alert and  oriented x4;  grossly nonfocal Skin:  Intact without significant lesions or rashes. Psych:  Alert and cooperative. Normal mood and affect.    Cory Busler L. Tarri Glenn, MD, MPH 02/21/2020, 3:47 PM

## 2020-02-21 NOTE — Patient Instructions (Addendum)
I have recommended a colonoscopy to be sure that there isn't any other cause for your rectal pain and bleeding.   COLONOSCOPY: You have been scheduled for a colonoscopy. Please follow written instructions given to you at your visit today.   PREP: Please pick up your prep supplies at the pharmacy within the next 1-3 days.  INHALERS: If you use inhalers (even only as needed), please bring them with you on the day of your procedure.  If you are age 35 or younger, your body mass index should be between 19-25. Your Body mass index is 25.52 kg/m. If this is out of the aformentioned range listed, please consider follow up with your Primary Care Provider.   Thank you for trusting me with your gastrointestinal care!    Thornton Park, MD, MPH

## 2020-02-22 ENCOUNTER — Encounter: Payer: Self-pay | Admitting: Gastroenterology

## 2020-02-22 DIAGNOSIS — Z992 Dependence on renal dialysis: Secondary | ICD-10-CM | POA: Diagnosis not present

## 2020-02-22 DIAGNOSIS — N2581 Secondary hyperparathyroidism of renal origin: Secondary | ICD-10-CM | POA: Diagnosis not present

## 2020-02-22 DIAGNOSIS — D631 Anemia in chronic kidney disease: Secondary | ICD-10-CM | POA: Diagnosis not present

## 2020-02-22 DIAGNOSIS — N186 End stage renal disease: Secondary | ICD-10-CM | POA: Diagnosis not present

## 2020-02-23 DIAGNOSIS — D631 Anemia in chronic kidney disease: Secondary | ICD-10-CM | POA: Diagnosis not present

## 2020-02-23 DIAGNOSIS — N2581 Secondary hyperparathyroidism of renal origin: Secondary | ICD-10-CM | POA: Diagnosis not present

## 2020-02-23 DIAGNOSIS — Z992 Dependence on renal dialysis: Secondary | ICD-10-CM | POA: Diagnosis not present

## 2020-02-23 DIAGNOSIS — N186 End stage renal disease: Secondary | ICD-10-CM | POA: Diagnosis not present

## 2020-02-24 DIAGNOSIS — N186 End stage renal disease: Secondary | ICD-10-CM | POA: Diagnosis not present

## 2020-02-24 DIAGNOSIS — N2581 Secondary hyperparathyroidism of renal origin: Secondary | ICD-10-CM | POA: Diagnosis not present

## 2020-02-24 DIAGNOSIS — D631 Anemia in chronic kidney disease: Secondary | ICD-10-CM | POA: Diagnosis not present

## 2020-02-24 DIAGNOSIS — Z992 Dependence on renal dialysis: Secondary | ICD-10-CM | POA: Diagnosis not present

## 2020-02-25 DIAGNOSIS — N2581 Secondary hyperparathyroidism of renal origin: Secondary | ICD-10-CM | POA: Diagnosis not present

## 2020-02-25 DIAGNOSIS — D631 Anemia in chronic kidney disease: Secondary | ICD-10-CM | POA: Diagnosis not present

## 2020-02-25 DIAGNOSIS — Z992 Dependence on renal dialysis: Secondary | ICD-10-CM | POA: Diagnosis not present

## 2020-02-25 DIAGNOSIS — N186 End stage renal disease: Secondary | ICD-10-CM | POA: Diagnosis not present

## 2020-02-26 DIAGNOSIS — D631 Anemia in chronic kidney disease: Secondary | ICD-10-CM | POA: Diagnosis not present

## 2020-02-26 DIAGNOSIS — Z992 Dependence on renal dialysis: Secondary | ICD-10-CM | POA: Diagnosis not present

## 2020-02-26 DIAGNOSIS — N186 End stage renal disease: Secondary | ICD-10-CM | POA: Diagnosis not present

## 2020-02-26 DIAGNOSIS — N2581 Secondary hyperparathyroidism of renal origin: Secondary | ICD-10-CM | POA: Diagnosis not present

## 2020-02-27 DIAGNOSIS — Z992 Dependence on renal dialysis: Secondary | ICD-10-CM | POA: Diagnosis not present

## 2020-02-27 DIAGNOSIS — N2581 Secondary hyperparathyroidism of renal origin: Secondary | ICD-10-CM | POA: Diagnosis not present

## 2020-02-27 DIAGNOSIS — D631 Anemia in chronic kidney disease: Secondary | ICD-10-CM | POA: Diagnosis not present

## 2020-02-27 DIAGNOSIS — N186 End stage renal disease: Secondary | ICD-10-CM | POA: Diagnosis not present

## 2020-02-28 DIAGNOSIS — N186 End stage renal disease: Secondary | ICD-10-CM | POA: Diagnosis not present

## 2020-02-28 DIAGNOSIS — N2581 Secondary hyperparathyroidism of renal origin: Secondary | ICD-10-CM | POA: Diagnosis not present

## 2020-02-28 DIAGNOSIS — D631 Anemia in chronic kidney disease: Secondary | ICD-10-CM | POA: Diagnosis not present

## 2020-02-28 DIAGNOSIS — Z992 Dependence on renal dialysis: Secondary | ICD-10-CM | POA: Diagnosis not present

## 2020-02-29 DIAGNOSIS — Z992 Dependence on renal dialysis: Secondary | ICD-10-CM | POA: Diagnosis not present

## 2020-02-29 DIAGNOSIS — N2581 Secondary hyperparathyroidism of renal origin: Secondary | ICD-10-CM | POA: Diagnosis not present

## 2020-02-29 DIAGNOSIS — D631 Anemia in chronic kidney disease: Secondary | ICD-10-CM | POA: Diagnosis not present

## 2020-02-29 DIAGNOSIS — N186 End stage renal disease: Secondary | ICD-10-CM | POA: Diagnosis not present

## 2020-03-01 DIAGNOSIS — Z992 Dependence on renal dialysis: Secondary | ICD-10-CM | POA: Diagnosis not present

## 2020-03-01 DIAGNOSIS — N186 End stage renal disease: Secondary | ICD-10-CM | POA: Diagnosis not present

## 2020-03-01 DIAGNOSIS — N2581 Secondary hyperparathyroidism of renal origin: Secondary | ICD-10-CM | POA: Diagnosis not present

## 2020-03-01 DIAGNOSIS — D631 Anemia in chronic kidney disease: Secondary | ICD-10-CM | POA: Diagnosis not present

## 2020-03-02 DIAGNOSIS — Z992 Dependence on renal dialysis: Secondary | ICD-10-CM | POA: Diagnosis not present

## 2020-03-02 DIAGNOSIS — N186 End stage renal disease: Secondary | ICD-10-CM | POA: Diagnosis not present

## 2020-03-02 DIAGNOSIS — N2581 Secondary hyperparathyroidism of renal origin: Secondary | ICD-10-CM | POA: Diagnosis not present

## 2020-03-02 DIAGNOSIS — D631 Anemia in chronic kidney disease: Secondary | ICD-10-CM | POA: Diagnosis not present

## 2020-03-03 DIAGNOSIS — Z992 Dependence on renal dialysis: Secondary | ICD-10-CM | POA: Diagnosis not present

## 2020-03-03 DIAGNOSIS — D631 Anemia in chronic kidney disease: Secondary | ICD-10-CM | POA: Diagnosis not present

## 2020-03-03 DIAGNOSIS — N186 End stage renal disease: Secondary | ICD-10-CM | POA: Diagnosis not present

## 2020-03-03 DIAGNOSIS — N2581 Secondary hyperparathyroidism of renal origin: Secondary | ICD-10-CM | POA: Diagnosis not present

## 2020-03-04 DIAGNOSIS — D631 Anemia in chronic kidney disease: Secondary | ICD-10-CM | POA: Diagnosis not present

## 2020-03-04 DIAGNOSIS — N2581 Secondary hyperparathyroidism of renal origin: Secondary | ICD-10-CM | POA: Diagnosis not present

## 2020-03-04 DIAGNOSIS — N186 End stage renal disease: Secondary | ICD-10-CM | POA: Diagnosis not present

## 2020-03-04 DIAGNOSIS — Z992 Dependence on renal dialysis: Secondary | ICD-10-CM | POA: Diagnosis not present

## 2020-03-05 DIAGNOSIS — N186 End stage renal disease: Secondary | ICD-10-CM | POA: Diagnosis not present

## 2020-03-05 DIAGNOSIS — D631 Anemia in chronic kidney disease: Secondary | ICD-10-CM | POA: Diagnosis not present

## 2020-03-05 DIAGNOSIS — Z992 Dependence on renal dialysis: Secondary | ICD-10-CM | POA: Diagnosis not present

## 2020-03-05 DIAGNOSIS — N2581 Secondary hyperparathyroidism of renal origin: Secondary | ICD-10-CM | POA: Diagnosis not present

## 2020-03-06 DIAGNOSIS — N186 End stage renal disease: Secondary | ICD-10-CM | POA: Diagnosis not present

## 2020-03-06 DIAGNOSIS — D631 Anemia in chronic kidney disease: Secondary | ICD-10-CM | POA: Diagnosis not present

## 2020-03-06 DIAGNOSIS — N2581 Secondary hyperparathyroidism of renal origin: Secondary | ICD-10-CM | POA: Diagnosis not present

## 2020-03-06 DIAGNOSIS — Z992 Dependence on renal dialysis: Secondary | ICD-10-CM | POA: Diagnosis not present

## 2020-03-06 DIAGNOSIS — I129 Hypertensive chronic kidney disease with stage 1 through stage 4 chronic kidney disease, or unspecified chronic kidney disease: Secondary | ICD-10-CM | POA: Diagnosis not present

## 2020-03-07 DIAGNOSIS — E440 Moderate protein-calorie malnutrition: Secondary | ICD-10-CM | POA: Diagnosis not present

## 2020-03-07 DIAGNOSIS — N186 End stage renal disease: Secondary | ICD-10-CM | POA: Diagnosis not present

## 2020-03-07 DIAGNOSIS — D509 Iron deficiency anemia, unspecified: Secondary | ICD-10-CM | POA: Diagnosis not present

## 2020-03-07 DIAGNOSIS — N2589 Other disorders resulting from impaired renal tubular function: Secondary | ICD-10-CM | POA: Diagnosis not present

## 2020-03-07 DIAGNOSIS — D631 Anemia in chronic kidney disease: Secondary | ICD-10-CM | POA: Diagnosis not present

## 2020-03-07 DIAGNOSIS — Z992 Dependence on renal dialysis: Secondary | ICD-10-CM | POA: Diagnosis not present

## 2020-03-07 DIAGNOSIS — E7841 Elevated Lipoprotein(a): Secondary | ICD-10-CM | POA: Diagnosis not present

## 2020-03-07 DIAGNOSIS — N2581 Secondary hyperparathyroidism of renal origin: Secondary | ICD-10-CM | POA: Diagnosis not present

## 2020-03-07 DIAGNOSIS — Z79899 Other long term (current) drug therapy: Secondary | ICD-10-CM | POA: Diagnosis not present

## 2020-03-08 DIAGNOSIS — D509 Iron deficiency anemia, unspecified: Secondary | ICD-10-CM | POA: Diagnosis not present

## 2020-03-08 DIAGNOSIS — Z79899 Other long term (current) drug therapy: Secondary | ICD-10-CM | POA: Diagnosis not present

## 2020-03-08 DIAGNOSIS — N2589 Other disorders resulting from impaired renal tubular function: Secondary | ICD-10-CM | POA: Diagnosis not present

## 2020-03-08 DIAGNOSIS — E7841 Elevated Lipoprotein(a): Secondary | ICD-10-CM | POA: Diagnosis not present

## 2020-03-08 DIAGNOSIS — N186 End stage renal disease: Secondary | ICD-10-CM | POA: Diagnosis not present

## 2020-03-08 DIAGNOSIS — N2581 Secondary hyperparathyroidism of renal origin: Secondary | ICD-10-CM | POA: Diagnosis not present

## 2020-03-08 DIAGNOSIS — Z992 Dependence on renal dialysis: Secondary | ICD-10-CM | POA: Diagnosis not present

## 2020-03-08 DIAGNOSIS — E440 Moderate protein-calorie malnutrition: Secondary | ICD-10-CM | POA: Diagnosis not present

## 2020-03-08 DIAGNOSIS — D631 Anemia in chronic kidney disease: Secondary | ICD-10-CM | POA: Diagnosis not present

## 2020-03-09 DIAGNOSIS — N2581 Secondary hyperparathyroidism of renal origin: Secondary | ICD-10-CM | POA: Diagnosis not present

## 2020-03-09 DIAGNOSIS — Z79899 Other long term (current) drug therapy: Secondary | ICD-10-CM | POA: Diagnosis not present

## 2020-03-09 DIAGNOSIS — E7841 Elevated Lipoprotein(a): Secondary | ICD-10-CM | POA: Diagnosis not present

## 2020-03-09 DIAGNOSIS — D631 Anemia in chronic kidney disease: Secondary | ICD-10-CM | POA: Diagnosis not present

## 2020-03-09 DIAGNOSIS — E440 Moderate protein-calorie malnutrition: Secondary | ICD-10-CM | POA: Diagnosis not present

## 2020-03-09 DIAGNOSIS — N2589 Other disorders resulting from impaired renal tubular function: Secondary | ICD-10-CM | POA: Diagnosis not present

## 2020-03-09 DIAGNOSIS — Z992 Dependence on renal dialysis: Secondary | ICD-10-CM | POA: Diagnosis not present

## 2020-03-09 DIAGNOSIS — D509 Iron deficiency anemia, unspecified: Secondary | ICD-10-CM | POA: Diagnosis not present

## 2020-03-09 DIAGNOSIS — N186 End stage renal disease: Secondary | ICD-10-CM | POA: Diagnosis not present

## 2020-03-10 DIAGNOSIS — Z992 Dependence on renal dialysis: Secondary | ICD-10-CM | POA: Diagnosis not present

## 2020-03-10 DIAGNOSIS — E7841 Elevated Lipoprotein(a): Secondary | ICD-10-CM | POA: Diagnosis not present

## 2020-03-10 DIAGNOSIS — D509 Iron deficiency anemia, unspecified: Secondary | ICD-10-CM | POA: Diagnosis not present

## 2020-03-10 DIAGNOSIS — N186 End stage renal disease: Secondary | ICD-10-CM | POA: Diagnosis not present

## 2020-03-10 DIAGNOSIS — Z79899 Other long term (current) drug therapy: Secondary | ICD-10-CM | POA: Diagnosis not present

## 2020-03-10 DIAGNOSIS — E440 Moderate protein-calorie malnutrition: Secondary | ICD-10-CM | POA: Diagnosis not present

## 2020-03-10 DIAGNOSIS — N2589 Other disorders resulting from impaired renal tubular function: Secondary | ICD-10-CM | POA: Diagnosis not present

## 2020-03-10 DIAGNOSIS — N2581 Secondary hyperparathyroidism of renal origin: Secondary | ICD-10-CM | POA: Diagnosis not present

## 2020-03-10 DIAGNOSIS — D631 Anemia in chronic kidney disease: Secondary | ICD-10-CM | POA: Diagnosis not present

## 2020-03-11 DIAGNOSIS — E440 Moderate protein-calorie malnutrition: Secondary | ICD-10-CM | POA: Diagnosis not present

## 2020-03-11 DIAGNOSIS — Z992 Dependence on renal dialysis: Secondary | ICD-10-CM | POA: Diagnosis not present

## 2020-03-11 DIAGNOSIS — D631 Anemia in chronic kidney disease: Secondary | ICD-10-CM | POA: Diagnosis not present

## 2020-03-11 DIAGNOSIS — E7841 Elevated Lipoprotein(a): Secondary | ICD-10-CM | POA: Diagnosis not present

## 2020-03-11 DIAGNOSIS — N2581 Secondary hyperparathyroidism of renal origin: Secondary | ICD-10-CM | POA: Diagnosis not present

## 2020-03-11 DIAGNOSIS — N2589 Other disorders resulting from impaired renal tubular function: Secondary | ICD-10-CM | POA: Diagnosis not present

## 2020-03-11 DIAGNOSIS — Z79899 Other long term (current) drug therapy: Secondary | ICD-10-CM | POA: Diagnosis not present

## 2020-03-11 DIAGNOSIS — D509 Iron deficiency anemia, unspecified: Secondary | ICD-10-CM | POA: Diagnosis not present

## 2020-03-11 DIAGNOSIS — N186 End stage renal disease: Secondary | ICD-10-CM | POA: Diagnosis not present

## 2020-03-12 DIAGNOSIS — D509 Iron deficiency anemia, unspecified: Secondary | ICD-10-CM | POA: Diagnosis not present

## 2020-03-12 DIAGNOSIS — E7841 Elevated Lipoprotein(a): Secondary | ICD-10-CM | POA: Diagnosis not present

## 2020-03-12 DIAGNOSIS — D631 Anemia in chronic kidney disease: Secondary | ICD-10-CM | POA: Diagnosis not present

## 2020-03-12 DIAGNOSIS — N2581 Secondary hyperparathyroidism of renal origin: Secondary | ICD-10-CM | POA: Diagnosis not present

## 2020-03-12 DIAGNOSIS — N186 End stage renal disease: Secondary | ICD-10-CM | POA: Diagnosis not present

## 2020-03-12 DIAGNOSIS — Z79899 Other long term (current) drug therapy: Secondary | ICD-10-CM | POA: Diagnosis not present

## 2020-03-12 DIAGNOSIS — N2589 Other disorders resulting from impaired renal tubular function: Secondary | ICD-10-CM | POA: Diagnosis not present

## 2020-03-12 DIAGNOSIS — Z992 Dependence on renal dialysis: Secondary | ICD-10-CM | POA: Diagnosis not present

## 2020-03-12 DIAGNOSIS — E440 Moderate protein-calorie malnutrition: Secondary | ICD-10-CM | POA: Diagnosis not present

## 2020-03-13 ENCOUNTER — Telehealth: Payer: Self-pay | Admitting: Family Medicine

## 2020-03-13 DIAGNOSIS — E440 Moderate protein-calorie malnutrition: Secondary | ICD-10-CM | POA: Diagnosis not present

## 2020-03-13 DIAGNOSIS — N186 End stage renal disease: Secondary | ICD-10-CM | POA: Diagnosis not present

## 2020-03-13 DIAGNOSIS — N2581 Secondary hyperparathyroidism of renal origin: Secondary | ICD-10-CM | POA: Diagnosis not present

## 2020-03-13 DIAGNOSIS — E7841 Elevated Lipoprotein(a): Secondary | ICD-10-CM | POA: Diagnosis not present

## 2020-03-13 DIAGNOSIS — D509 Iron deficiency anemia, unspecified: Secondary | ICD-10-CM | POA: Diagnosis not present

## 2020-03-13 DIAGNOSIS — Z79899 Other long term (current) drug therapy: Secondary | ICD-10-CM | POA: Diagnosis not present

## 2020-03-13 DIAGNOSIS — Z992 Dependence on renal dialysis: Secondary | ICD-10-CM | POA: Diagnosis not present

## 2020-03-13 DIAGNOSIS — D631 Anemia in chronic kidney disease: Secondary | ICD-10-CM | POA: Diagnosis not present

## 2020-03-13 DIAGNOSIS — N2589 Other disorders resulting from impaired renal tubular function: Secondary | ICD-10-CM | POA: Diagnosis not present

## 2020-03-13 NOTE — Telephone Encounter (Signed)
Left message for patient to call back and schedule Medicare Annual Wellness Visit (AWV) either virtually or in office.   Last AWV  No information  please schedule at anytime with [facility] Nurse Health Advisor 1 or 2   This should be a 45 minute visit. 

## 2020-03-14 DIAGNOSIS — D509 Iron deficiency anemia, unspecified: Secondary | ICD-10-CM | POA: Diagnosis not present

## 2020-03-14 DIAGNOSIS — D631 Anemia in chronic kidney disease: Secondary | ICD-10-CM | POA: Diagnosis not present

## 2020-03-14 DIAGNOSIS — N2581 Secondary hyperparathyroidism of renal origin: Secondary | ICD-10-CM | POA: Diagnosis not present

## 2020-03-14 DIAGNOSIS — N2589 Other disorders resulting from impaired renal tubular function: Secondary | ICD-10-CM | POA: Diagnosis not present

## 2020-03-14 DIAGNOSIS — E440 Moderate protein-calorie malnutrition: Secondary | ICD-10-CM | POA: Diagnosis not present

## 2020-03-14 DIAGNOSIS — Z79899 Other long term (current) drug therapy: Secondary | ICD-10-CM | POA: Diagnosis not present

## 2020-03-14 DIAGNOSIS — N186 End stage renal disease: Secondary | ICD-10-CM | POA: Diagnosis not present

## 2020-03-14 DIAGNOSIS — E7841 Elevated Lipoprotein(a): Secondary | ICD-10-CM | POA: Diagnosis not present

## 2020-03-14 DIAGNOSIS — Z992 Dependence on renal dialysis: Secondary | ICD-10-CM | POA: Diagnosis not present

## 2020-03-15 DIAGNOSIS — D631 Anemia in chronic kidney disease: Secondary | ICD-10-CM | POA: Diagnosis not present

## 2020-03-15 DIAGNOSIS — Z992 Dependence on renal dialysis: Secondary | ICD-10-CM | POA: Diagnosis not present

## 2020-03-15 DIAGNOSIS — Z79899 Other long term (current) drug therapy: Secondary | ICD-10-CM | POA: Diagnosis not present

## 2020-03-15 DIAGNOSIS — N2581 Secondary hyperparathyroidism of renal origin: Secondary | ICD-10-CM | POA: Diagnosis not present

## 2020-03-15 DIAGNOSIS — E7841 Elevated Lipoprotein(a): Secondary | ICD-10-CM | POA: Diagnosis not present

## 2020-03-15 DIAGNOSIS — N186 End stage renal disease: Secondary | ICD-10-CM | POA: Diagnosis not present

## 2020-03-15 DIAGNOSIS — N2589 Other disorders resulting from impaired renal tubular function: Secondary | ICD-10-CM | POA: Diagnosis not present

## 2020-03-15 DIAGNOSIS — D509 Iron deficiency anemia, unspecified: Secondary | ICD-10-CM | POA: Diagnosis not present

## 2020-03-15 DIAGNOSIS — E440 Moderate protein-calorie malnutrition: Secondary | ICD-10-CM | POA: Diagnosis not present

## 2020-03-16 DIAGNOSIS — E440 Moderate protein-calorie malnutrition: Secondary | ICD-10-CM | POA: Diagnosis not present

## 2020-03-16 DIAGNOSIS — Z992 Dependence on renal dialysis: Secondary | ICD-10-CM | POA: Diagnosis not present

## 2020-03-16 DIAGNOSIS — E7841 Elevated Lipoprotein(a): Secondary | ICD-10-CM | POA: Diagnosis not present

## 2020-03-16 DIAGNOSIS — N2589 Other disorders resulting from impaired renal tubular function: Secondary | ICD-10-CM | POA: Diagnosis not present

## 2020-03-16 DIAGNOSIS — Z79899 Other long term (current) drug therapy: Secondary | ICD-10-CM | POA: Diagnosis not present

## 2020-03-16 DIAGNOSIS — D509 Iron deficiency anemia, unspecified: Secondary | ICD-10-CM | POA: Diagnosis not present

## 2020-03-16 DIAGNOSIS — N186 End stage renal disease: Secondary | ICD-10-CM | POA: Diagnosis not present

## 2020-03-16 DIAGNOSIS — N2581 Secondary hyperparathyroidism of renal origin: Secondary | ICD-10-CM | POA: Diagnosis not present

## 2020-03-16 DIAGNOSIS — D631 Anemia in chronic kidney disease: Secondary | ICD-10-CM | POA: Diagnosis not present

## 2020-03-17 DIAGNOSIS — Z79899 Other long term (current) drug therapy: Secondary | ICD-10-CM | POA: Diagnosis not present

## 2020-03-17 DIAGNOSIS — N2589 Other disorders resulting from impaired renal tubular function: Secondary | ICD-10-CM | POA: Diagnosis not present

## 2020-03-17 DIAGNOSIS — E440 Moderate protein-calorie malnutrition: Secondary | ICD-10-CM | POA: Diagnosis not present

## 2020-03-17 DIAGNOSIS — N186 End stage renal disease: Secondary | ICD-10-CM | POA: Diagnosis not present

## 2020-03-17 DIAGNOSIS — N2581 Secondary hyperparathyroidism of renal origin: Secondary | ICD-10-CM | POA: Diagnosis not present

## 2020-03-17 DIAGNOSIS — D509 Iron deficiency anemia, unspecified: Secondary | ICD-10-CM | POA: Diagnosis not present

## 2020-03-17 DIAGNOSIS — Z992 Dependence on renal dialysis: Secondary | ICD-10-CM | POA: Diagnosis not present

## 2020-03-17 DIAGNOSIS — D631 Anemia in chronic kidney disease: Secondary | ICD-10-CM | POA: Diagnosis not present

## 2020-03-17 DIAGNOSIS — E7841 Elevated Lipoprotein(a): Secondary | ICD-10-CM | POA: Diagnosis not present

## 2020-03-18 DIAGNOSIS — E440 Moderate protein-calorie malnutrition: Secondary | ICD-10-CM | POA: Diagnosis not present

## 2020-03-18 DIAGNOSIS — D631 Anemia in chronic kidney disease: Secondary | ICD-10-CM | POA: Diagnosis not present

## 2020-03-18 DIAGNOSIS — Z992 Dependence on renal dialysis: Secondary | ICD-10-CM | POA: Diagnosis not present

## 2020-03-18 DIAGNOSIS — N2581 Secondary hyperparathyroidism of renal origin: Secondary | ICD-10-CM | POA: Diagnosis not present

## 2020-03-18 DIAGNOSIS — D509 Iron deficiency anemia, unspecified: Secondary | ICD-10-CM | POA: Diagnosis not present

## 2020-03-18 DIAGNOSIS — Z79899 Other long term (current) drug therapy: Secondary | ICD-10-CM | POA: Diagnosis not present

## 2020-03-18 DIAGNOSIS — E7841 Elevated Lipoprotein(a): Secondary | ICD-10-CM | POA: Diagnosis not present

## 2020-03-18 DIAGNOSIS — N186 End stage renal disease: Secondary | ICD-10-CM | POA: Diagnosis not present

## 2020-03-18 DIAGNOSIS — N2589 Other disorders resulting from impaired renal tubular function: Secondary | ICD-10-CM | POA: Diagnosis not present

## 2020-03-19 DIAGNOSIS — E7841 Elevated Lipoprotein(a): Secondary | ICD-10-CM | POA: Diagnosis not present

## 2020-03-19 DIAGNOSIS — D631 Anemia in chronic kidney disease: Secondary | ICD-10-CM | POA: Diagnosis not present

## 2020-03-19 DIAGNOSIS — Z79899 Other long term (current) drug therapy: Secondary | ICD-10-CM | POA: Diagnosis not present

## 2020-03-19 DIAGNOSIS — N186 End stage renal disease: Secondary | ICD-10-CM | POA: Diagnosis not present

## 2020-03-19 DIAGNOSIS — E440 Moderate protein-calorie malnutrition: Secondary | ICD-10-CM | POA: Diagnosis not present

## 2020-03-19 DIAGNOSIS — Z992 Dependence on renal dialysis: Secondary | ICD-10-CM | POA: Diagnosis not present

## 2020-03-19 DIAGNOSIS — D509 Iron deficiency anemia, unspecified: Secondary | ICD-10-CM | POA: Diagnosis not present

## 2020-03-19 DIAGNOSIS — N2581 Secondary hyperparathyroidism of renal origin: Secondary | ICD-10-CM | POA: Diagnosis not present

## 2020-03-19 DIAGNOSIS — N2589 Other disorders resulting from impaired renal tubular function: Secondary | ICD-10-CM | POA: Diagnosis not present

## 2020-03-20 DIAGNOSIS — N2589 Other disorders resulting from impaired renal tubular function: Secondary | ICD-10-CM | POA: Diagnosis not present

## 2020-03-20 DIAGNOSIS — E440 Moderate protein-calorie malnutrition: Secondary | ICD-10-CM | POA: Diagnosis not present

## 2020-03-20 DIAGNOSIS — N2581 Secondary hyperparathyroidism of renal origin: Secondary | ICD-10-CM | POA: Diagnosis not present

## 2020-03-20 DIAGNOSIS — D509 Iron deficiency anemia, unspecified: Secondary | ICD-10-CM | POA: Diagnosis not present

## 2020-03-20 DIAGNOSIS — Z79899 Other long term (current) drug therapy: Secondary | ICD-10-CM | POA: Diagnosis not present

## 2020-03-20 DIAGNOSIS — D631 Anemia in chronic kidney disease: Secondary | ICD-10-CM | POA: Diagnosis not present

## 2020-03-20 DIAGNOSIS — Z992 Dependence on renal dialysis: Secondary | ICD-10-CM | POA: Diagnosis not present

## 2020-03-20 DIAGNOSIS — N186 End stage renal disease: Secondary | ICD-10-CM | POA: Diagnosis not present

## 2020-03-20 DIAGNOSIS — E7841 Elevated Lipoprotein(a): Secondary | ICD-10-CM | POA: Diagnosis not present

## 2020-03-21 ENCOUNTER — Encounter: Payer: Self-pay | Admitting: Gastroenterology

## 2020-03-21 ENCOUNTER — Ambulatory Visit (AMBULATORY_SURGERY_CENTER): Payer: Medicare Other | Admitting: Gastroenterology

## 2020-03-21 ENCOUNTER — Other Ambulatory Visit: Payer: Self-pay

## 2020-03-21 VITALS — BP 124/79 | HR 64 | Temp 98.2°F | Resp 16 | Ht 71.0 in | Wt 183.0 lb

## 2020-03-21 DIAGNOSIS — K529 Noninfective gastroenteritis and colitis, unspecified: Secondary | ICD-10-CM | POA: Diagnosis not present

## 2020-03-21 DIAGNOSIS — K6289 Other specified diseases of anus and rectum: Secondary | ICD-10-CM | POA: Diagnosis not present

## 2020-03-21 DIAGNOSIS — D631 Anemia in chronic kidney disease: Secondary | ICD-10-CM | POA: Diagnosis not present

## 2020-03-21 DIAGNOSIS — N186 End stage renal disease: Secondary | ICD-10-CM | POA: Diagnosis not present

## 2020-03-21 DIAGNOSIS — N2581 Secondary hyperparathyroidism of renal origin: Secondary | ICD-10-CM | POA: Diagnosis not present

## 2020-03-21 DIAGNOSIS — K625 Hemorrhage of anus and rectum: Secondary | ICD-10-CM | POA: Diagnosis not present

## 2020-03-21 DIAGNOSIS — E7841 Elevated Lipoprotein(a): Secondary | ICD-10-CM | POA: Diagnosis not present

## 2020-03-21 DIAGNOSIS — Z79899 Other long term (current) drug therapy: Secondary | ICD-10-CM | POA: Diagnosis not present

## 2020-03-21 DIAGNOSIS — K5289 Other specified noninfective gastroenteritis and colitis: Secondary | ICD-10-CM | POA: Diagnosis not present

## 2020-03-21 DIAGNOSIS — N2589 Other disorders resulting from impaired renal tubular function: Secondary | ICD-10-CM | POA: Diagnosis not present

## 2020-03-21 DIAGNOSIS — E440 Moderate protein-calorie malnutrition: Secondary | ICD-10-CM | POA: Diagnosis not present

## 2020-03-21 DIAGNOSIS — Z992 Dependence on renal dialysis: Secondary | ICD-10-CM | POA: Diagnosis not present

## 2020-03-21 DIAGNOSIS — D509 Iron deficiency anemia, unspecified: Secondary | ICD-10-CM | POA: Diagnosis not present

## 2020-03-21 MED ORDER — SODIUM CHLORIDE 0.9 % IV SOLN: 500.0000 mL | Freq: Once | INTRAVENOUS | Status: DC

## 2020-03-21 NOTE — Progress Notes (Signed)
pt tolerated well. VSS. awake and to recovery. Report given to RN.  

## 2020-03-21 NOTE — Op Note (Signed)
[place] Patient Name: Cory Dixon Procedure Date: 03/21/2020 3:12 PM MRN: [id number] Endoscopist: Thornton Park MD, MD Age: 34 Referring MD:  Date of Birth: Dec 14, 1985 Gender: Male Account #: [id number] Procedure:                Colonoscopy Indications:              Rectal bleeding, Rectal pain Medicines:                Monitored Anesthesia Care Procedure:                Pre-Anesthesia Assessment:                           - Prior to the procedure, a History and Physical                            was performed, and patient medications and                            allergies were reviewed. The patient's tolerance of                            previous anesthesia was also reviewed. The risks                            and benefits of the procedure and the sedation                            options and risks were discussed with the patient.                            All questions were answered, and informed consent                            was obtained. Prior Anticoagulants: The patient has                            taken no previous anticoagulant or antiplatelet                            agents. ASA Grade Assessment: III - A patient with                            severe systemic disease. After reviewing the risks                            and benefits, the patient was deemed in                            satisfactory condition to undergo the procedure.                           After obtaining informed consent, the colonoscope  was passed under direct vision. Throughout the                            procedure, the patient's blood pressure, pulse, and                            oxygen saturations were monitored continuously. The                            Olympus CF-HQ190L ([id number]) Colonoscope was                            introduced through the anus and advanced to the 5                            cm into the ileum. The  colonoscopy was performed                            without difficulty. The patient tolerated the                            procedure well. The quality of the bowel                            preparation was good. The terminal ileum, ileocecal                            valve, appendiceal orifice, and rectum were                            photographed. Scope In: 3:26:35 PM Scope Out: 3:39:14 PM Scope Withdrawal Time: 0 hours 10 minutes 42 seconds  Total Procedure Duration: 0 hours 12 minutes 39 seconds  Findings:                 The perianal and digital rectal examinations were                            normal. Small internal hemorrhoids present.                           Four superficial ulcers were found in the rectum.                            No bleeding was present. No stigmata of recent                            bleeding were seen. Biopsies were taken with a cold                            forceps for histology. Estimated blood loss was                            minimal.  Mild melanosis coli present throughout the colon.                            The exam was otherwise without abnormality on                            direct and retroflexion views. Complications:            No immediate complications. Estimated blood loss:                            Minimal. Estimated Blood Loss:     Estimated blood loss was minimal. Estimated blood                            loss was minimal. Impression:               - Erythematous mucosa in the rectum. Biopsied.                           - Mild melanosis coli.                           - The examination was otherwise normal on direct                            and retroflexion views. Recommendation:           - Patient has a contact number available for                            emergencies. The signs and symptoms of potential                            delayed complications were discussed with the                             patient. Return to normal activities tomorrow.                            Written discharge instructions were provided to the                            patient.                           - Resume previous diet.                           - Continue present medications.                           - Await pathology results.                           - Repeat colonoscopy at age 51 for screening  purposes, earlier with new symptoms. Thornton Park MD, MD 03/21/2020 3:45:27 PM This report has been signed electronically.

## 2020-03-21 NOTE — Patient Instructions (Signed)
YOU HAD AN ENDOSCOPIC PROCEDURE TODAY AT THE [facility]:   Refer to the procedure report that was given to you for any specific questions about what was found during the examination.  If the procedure report does not answer your questions, please call your gastroenterologist to clarify.  If you requested that your care partner not be given the details of your procedure findings, then the procedure report has been included in a sealed envelope for you to review at your convenience later.  YOU SHOULD EXPECT: Some feelings of bloating in the abdomen. Passage of more gas than usual.  Walking can help get rid of the air that was put into your GI tract during the procedure and reduce the bloating. If you had a lower endoscopy (such as a colonoscopy or flexible sigmoidoscopy) you may notice spotting of blood in your stool or on the toilet paper. If you underwent a bowel prep for your procedure, you may not have a normal bowel movement for a few days.  Please Note:  You might notice some irritation and congestion in your nose or some drainage.  This is from the oxygen used during your procedure.  There is no need for concern and it should clear up in a day or so.  SYMPTOMS TO REPORT IMMEDIATELY:   Following lower endoscopy (colonoscopy or flexible sigmoidoscopy):  Excessive amounts of blood in the stool  Significant tenderness or worsening of abdominal pains  Swelling of the abdomen that is new, acute  Fever of 100F or higher  For urgent or emergent issues, a gastroenterologist can be reached at any hour by calling [phone]. Do not use MyChart messaging for urgent concerns.    DIET:  We do recommend a small meal at first, but then you may proceed to your regular diet.  Drink plenty of fluids but you should avoid alcoholic beverages for 24 hours.  ACTIVITY:  You should plan to take it easy for the rest of today and you should NOT DRIVE or use heavy machinery until tomorrow (because  of the sedation medicines used during the test).    FOLLOW UP: Our staff will call the number listed on your records 48-72 hours following your procedure to check on you and address any questions or concerns that you may have regarding the information given to you following your procedure. If we do not reach you, we will leave a message.  We will attempt to reach you two times.  During this call, we will ask if you have developed any symptoms of COVID 19. If you develop any symptoms (ie: fever, flu-like symptoms, shortness of breath, cough etc.) before then, please call [phone].  If you test positive for Covid 19 in the 2 weeks post procedure, please call and report this information to us.    If any biopsies were taken you will be contacted by phone or by letter within the next 1-3 weeks.  Please call us at [phone] if you have not heard about the biopsies in 3 weeks.    SIGNATURES/CONFIDENTIALITY: You and/or your care partner have signed paperwork which will be entered into your electronic medical record.  These signatures attest to the fact that that the information above on your After Visit Summary has been reviewed and is understood.  Full responsibility of the confidentiality of this discharge information lies with you and/or your care-partner. 

## 2020-03-21 NOTE — Progress Notes (Signed)
Called to room to assist during endoscopic procedure.  Patient ID and intended procedure confirmed with present staff. Received instructions for my participation in the procedure from the performing physician.  

## 2020-03-21 NOTE — Progress Notes (Signed)
Pt's states no medical or surgical changes since previsit or office visit. 

## 2020-03-22 DIAGNOSIS — D509 Iron deficiency anemia, unspecified: Secondary | ICD-10-CM | POA: Diagnosis not present

## 2020-03-22 DIAGNOSIS — E7841 Elevated Lipoprotein(a): Secondary | ICD-10-CM | POA: Diagnosis not present

## 2020-03-22 DIAGNOSIS — Z992 Dependence on renal dialysis: Secondary | ICD-10-CM | POA: Diagnosis not present

## 2020-03-22 DIAGNOSIS — N2589 Other disorders resulting from impaired renal tubular function: Secondary | ICD-10-CM | POA: Diagnosis not present

## 2020-03-22 DIAGNOSIS — N2581 Secondary hyperparathyroidism of renal origin: Secondary | ICD-10-CM | POA: Diagnosis not present

## 2020-03-22 DIAGNOSIS — N186 End stage renal disease: Secondary | ICD-10-CM | POA: Diagnosis not present

## 2020-03-22 DIAGNOSIS — D631 Anemia in chronic kidney disease: Secondary | ICD-10-CM | POA: Diagnosis not present

## 2020-03-22 DIAGNOSIS — E440 Moderate protein-calorie malnutrition: Secondary | ICD-10-CM | POA: Diagnosis not present

## 2020-03-22 DIAGNOSIS — Z79899 Other long term (current) drug therapy: Secondary | ICD-10-CM | POA: Diagnosis not present

## 2020-03-23 ENCOUNTER — Telehealth: Payer: Self-pay

## 2020-03-23 ENCOUNTER — Ambulatory Visit: Payer: Medicare Other | Admitting: Gastroenterology

## 2020-03-23 DIAGNOSIS — D631 Anemia in chronic kidney disease: Secondary | ICD-10-CM | POA: Diagnosis not present

## 2020-03-23 DIAGNOSIS — Z79899 Other long term (current) drug therapy: Secondary | ICD-10-CM | POA: Diagnosis not present

## 2020-03-23 DIAGNOSIS — Z992 Dependence on renal dialysis: Secondary | ICD-10-CM | POA: Diagnosis not present

## 2020-03-23 DIAGNOSIS — N2581 Secondary hyperparathyroidism of renal origin: Secondary | ICD-10-CM | POA: Diagnosis not present

## 2020-03-23 DIAGNOSIS — E7841 Elevated Lipoprotein(a): Secondary | ICD-10-CM | POA: Diagnosis not present

## 2020-03-23 DIAGNOSIS — D509 Iron deficiency anemia, unspecified: Secondary | ICD-10-CM | POA: Diagnosis not present

## 2020-03-23 DIAGNOSIS — N186 End stage renal disease: Secondary | ICD-10-CM | POA: Diagnosis not present

## 2020-03-23 DIAGNOSIS — N2589 Other disorders resulting from impaired renal tubular function: Secondary | ICD-10-CM | POA: Diagnosis not present

## 2020-03-23 DIAGNOSIS — E440 Moderate protein-calorie malnutrition: Secondary | ICD-10-CM | POA: Diagnosis not present

## 2020-03-23 NOTE — Telephone Encounter (Signed)
  Follow up Call-  Call back number 03/21/2020  Post procedure Call Back phone  # [phone]  Permission to leave phone message Yes  Some recent data might be hidden     Patient questions:  Do you have a fever, pain , or abdominal swelling? No. Pain Score  0 *  Have you tolerated food without any problems? Yes.    Have you been able to return to your normal activities? Yes.    Do you have any questions about your discharge instructions: Diet   No. Medications  No. Follow up visit  No.  Do you have questions or concerns about your Care? No.  Actions: * If pain score is 4 or above: No action needed, pain <4. 1. Have you developed a fever since your procedure? no  2.   Have you had an respiratory symptoms (SOB or cough) since your procedure? no  3.   Have you tested positive for COVID 19 since your procedure no  4.   Have you had any family members/close contacts diagnosed with the COVID 19 since your procedure?  no   If yes to any of these questions please route to Joylene John, RN and Joella Prince, RN

## 2020-03-24 DIAGNOSIS — E7841 Elevated Lipoprotein(a): Secondary | ICD-10-CM | POA: Diagnosis not present

## 2020-03-24 DIAGNOSIS — N2581 Secondary hyperparathyroidism of renal origin: Secondary | ICD-10-CM | POA: Diagnosis not present

## 2020-03-24 DIAGNOSIS — Z79899 Other long term (current) drug therapy: Secondary | ICD-10-CM | POA: Diagnosis not present

## 2020-03-24 DIAGNOSIS — E440 Moderate protein-calorie malnutrition: Secondary | ICD-10-CM | POA: Diagnosis not present

## 2020-03-24 DIAGNOSIS — Z992 Dependence on renal dialysis: Secondary | ICD-10-CM | POA: Diagnosis not present

## 2020-03-24 DIAGNOSIS — N186 End stage renal disease: Secondary | ICD-10-CM | POA: Diagnosis not present

## 2020-03-24 DIAGNOSIS — N2589 Other disorders resulting from impaired renal tubular function: Secondary | ICD-10-CM | POA: Diagnosis not present

## 2020-03-24 DIAGNOSIS — D509 Iron deficiency anemia, unspecified: Secondary | ICD-10-CM | POA: Diagnosis not present

## 2020-03-24 DIAGNOSIS — D631 Anemia in chronic kidney disease: Secondary | ICD-10-CM | POA: Diagnosis not present

## 2020-03-25 DIAGNOSIS — N2581 Secondary hyperparathyroidism of renal origin: Secondary | ICD-10-CM | POA: Diagnosis not present

## 2020-03-25 DIAGNOSIS — Z992 Dependence on renal dialysis: Secondary | ICD-10-CM | POA: Diagnosis not present

## 2020-03-25 DIAGNOSIS — N2589 Other disorders resulting from impaired renal tubular function: Secondary | ICD-10-CM | POA: Diagnosis not present

## 2020-03-25 DIAGNOSIS — D631 Anemia in chronic kidney disease: Secondary | ICD-10-CM | POA: Diagnosis not present

## 2020-03-25 DIAGNOSIS — E7841 Elevated Lipoprotein(a): Secondary | ICD-10-CM | POA: Diagnosis not present

## 2020-03-25 DIAGNOSIS — E440 Moderate protein-calorie malnutrition: Secondary | ICD-10-CM | POA: Diagnosis not present

## 2020-03-25 DIAGNOSIS — Z79899 Other long term (current) drug therapy: Secondary | ICD-10-CM | POA: Diagnosis not present

## 2020-03-25 DIAGNOSIS — N186 End stage renal disease: Secondary | ICD-10-CM | POA: Diagnosis not present

## 2020-03-25 DIAGNOSIS — D509 Iron deficiency anemia, unspecified: Secondary | ICD-10-CM | POA: Diagnosis not present

## 2020-03-26 DIAGNOSIS — N2589 Other disorders resulting from impaired renal tubular function: Secondary | ICD-10-CM | POA: Diagnosis not present

## 2020-03-26 DIAGNOSIS — D631 Anemia in chronic kidney disease: Secondary | ICD-10-CM | POA: Diagnosis not present

## 2020-03-26 DIAGNOSIS — Z992 Dependence on renal dialysis: Secondary | ICD-10-CM | POA: Diagnosis not present

## 2020-03-26 DIAGNOSIS — E7841 Elevated Lipoprotein(a): Secondary | ICD-10-CM | POA: Diagnosis not present

## 2020-03-26 DIAGNOSIS — N186 End stage renal disease: Secondary | ICD-10-CM | POA: Diagnosis not present

## 2020-03-26 DIAGNOSIS — D509 Iron deficiency anemia, unspecified: Secondary | ICD-10-CM | POA: Diagnosis not present

## 2020-03-26 DIAGNOSIS — E440 Moderate protein-calorie malnutrition: Secondary | ICD-10-CM | POA: Diagnosis not present

## 2020-03-26 DIAGNOSIS — N2581 Secondary hyperparathyroidism of renal origin: Secondary | ICD-10-CM | POA: Diagnosis not present

## 2020-03-26 DIAGNOSIS — Z79899 Other long term (current) drug therapy: Secondary | ICD-10-CM | POA: Diagnosis not present

## 2020-03-27 DIAGNOSIS — N2589 Other disorders resulting from impaired renal tubular function: Secondary | ICD-10-CM | POA: Diagnosis not present

## 2020-03-27 DIAGNOSIS — Z79899 Other long term (current) drug therapy: Secondary | ICD-10-CM | POA: Diagnosis not present

## 2020-03-27 DIAGNOSIS — Z992 Dependence on renal dialysis: Secondary | ICD-10-CM | POA: Diagnosis not present

## 2020-03-27 DIAGNOSIS — D509 Iron deficiency anemia, unspecified: Secondary | ICD-10-CM | POA: Diagnosis not present

## 2020-03-27 DIAGNOSIS — E7841 Elevated Lipoprotein(a): Secondary | ICD-10-CM | POA: Diagnosis not present

## 2020-03-27 DIAGNOSIS — E440 Moderate protein-calorie malnutrition: Secondary | ICD-10-CM | POA: Diagnosis not present

## 2020-03-27 DIAGNOSIS — N2581 Secondary hyperparathyroidism of renal origin: Secondary | ICD-10-CM | POA: Diagnosis not present

## 2020-03-27 DIAGNOSIS — N186 End stage renal disease: Secondary | ICD-10-CM | POA: Diagnosis not present

## 2020-03-27 DIAGNOSIS — D631 Anemia in chronic kidney disease: Secondary | ICD-10-CM | POA: Diagnosis not present

## 2020-03-28 DIAGNOSIS — N2581 Secondary hyperparathyroidism of renal origin: Secondary | ICD-10-CM | POA: Diagnosis not present

## 2020-03-28 DIAGNOSIS — N2589 Other disorders resulting from impaired renal tubular function: Secondary | ICD-10-CM | POA: Diagnosis not present

## 2020-03-28 DIAGNOSIS — Z992 Dependence on renal dialysis: Secondary | ICD-10-CM | POA: Diagnosis not present

## 2020-03-28 DIAGNOSIS — D509 Iron deficiency anemia, unspecified: Secondary | ICD-10-CM | POA: Diagnosis not present

## 2020-03-28 DIAGNOSIS — E7841 Elevated Lipoprotein(a): Secondary | ICD-10-CM | POA: Diagnosis not present

## 2020-03-28 DIAGNOSIS — E440 Moderate protein-calorie malnutrition: Secondary | ICD-10-CM | POA: Diagnosis not present

## 2020-03-28 DIAGNOSIS — Z79899 Other long term (current) drug therapy: Secondary | ICD-10-CM | POA: Diagnosis not present

## 2020-03-28 DIAGNOSIS — D631 Anemia in chronic kidney disease: Secondary | ICD-10-CM | POA: Diagnosis not present

## 2020-03-28 DIAGNOSIS — N186 End stage renal disease: Secondary | ICD-10-CM | POA: Diagnosis not present

## 2020-03-29 DIAGNOSIS — D509 Iron deficiency anemia, unspecified: Secondary | ICD-10-CM | POA: Diagnosis not present

## 2020-03-29 DIAGNOSIS — N186 End stage renal disease: Secondary | ICD-10-CM | POA: Diagnosis not present

## 2020-03-29 DIAGNOSIS — N2589 Other disorders resulting from impaired renal tubular function: Secondary | ICD-10-CM | POA: Diagnosis not present

## 2020-03-29 DIAGNOSIS — Z992 Dependence on renal dialysis: Secondary | ICD-10-CM | POA: Diagnosis not present

## 2020-03-29 DIAGNOSIS — E440 Moderate protein-calorie malnutrition: Secondary | ICD-10-CM | POA: Diagnosis not present

## 2020-03-29 DIAGNOSIS — Z79899 Other long term (current) drug therapy: Secondary | ICD-10-CM | POA: Diagnosis not present

## 2020-03-29 DIAGNOSIS — D631 Anemia in chronic kidney disease: Secondary | ICD-10-CM | POA: Diagnosis not present

## 2020-03-29 DIAGNOSIS — E7841 Elevated Lipoprotein(a): Secondary | ICD-10-CM | POA: Diagnosis not present

## 2020-03-29 DIAGNOSIS — N2581 Secondary hyperparathyroidism of renal origin: Secondary | ICD-10-CM | POA: Diagnosis not present

## 2020-03-30 DIAGNOSIS — N2589 Other disorders resulting from impaired renal tubular function: Secondary | ICD-10-CM | POA: Diagnosis not present

## 2020-03-30 DIAGNOSIS — Z79899 Other long term (current) drug therapy: Secondary | ICD-10-CM | POA: Diagnosis not present

## 2020-03-30 DIAGNOSIS — Z992 Dependence on renal dialysis: Secondary | ICD-10-CM | POA: Diagnosis not present

## 2020-03-30 DIAGNOSIS — D509 Iron deficiency anemia, unspecified: Secondary | ICD-10-CM | POA: Diagnosis not present

## 2020-03-30 DIAGNOSIS — N186 End stage renal disease: Secondary | ICD-10-CM | POA: Diagnosis not present

## 2020-03-30 DIAGNOSIS — N2581 Secondary hyperparathyroidism of renal origin: Secondary | ICD-10-CM | POA: Diagnosis not present

## 2020-03-30 DIAGNOSIS — D631 Anemia in chronic kidney disease: Secondary | ICD-10-CM | POA: Diagnosis not present

## 2020-03-30 DIAGNOSIS — E7841 Elevated Lipoprotein(a): Secondary | ICD-10-CM | POA: Diagnosis not present

## 2020-03-30 DIAGNOSIS — E440 Moderate protein-calorie malnutrition: Secondary | ICD-10-CM | POA: Diagnosis not present

## 2020-03-31 DIAGNOSIS — N186 End stage renal disease: Secondary | ICD-10-CM | POA: Diagnosis not present

## 2020-03-31 DIAGNOSIS — N2581 Secondary hyperparathyroidism of renal origin: Secondary | ICD-10-CM | POA: Diagnosis not present

## 2020-03-31 DIAGNOSIS — Z992 Dependence on renal dialysis: Secondary | ICD-10-CM | POA: Diagnosis not present

## 2020-03-31 DIAGNOSIS — E7841 Elevated Lipoprotein(a): Secondary | ICD-10-CM | POA: Diagnosis not present

## 2020-03-31 DIAGNOSIS — D509 Iron deficiency anemia, unspecified: Secondary | ICD-10-CM | POA: Diagnosis not present

## 2020-03-31 DIAGNOSIS — N2589 Other disorders resulting from impaired renal tubular function: Secondary | ICD-10-CM | POA: Diagnosis not present

## 2020-03-31 DIAGNOSIS — Z79899 Other long term (current) drug therapy: Secondary | ICD-10-CM | POA: Diagnosis not present

## 2020-03-31 DIAGNOSIS — D631 Anemia in chronic kidney disease: Secondary | ICD-10-CM | POA: Diagnosis not present

## 2020-03-31 DIAGNOSIS — E440 Moderate protein-calorie malnutrition: Secondary | ICD-10-CM | POA: Diagnosis not present

## 2020-04-01 DIAGNOSIS — N186 End stage renal disease: Secondary | ICD-10-CM | POA: Diagnosis not present

## 2020-04-01 DIAGNOSIS — Z79899 Other long term (current) drug therapy: Secondary | ICD-10-CM | POA: Diagnosis not present

## 2020-04-01 DIAGNOSIS — N2581 Secondary hyperparathyroidism of renal origin: Secondary | ICD-10-CM | POA: Diagnosis not present

## 2020-04-01 DIAGNOSIS — D509 Iron deficiency anemia, unspecified: Secondary | ICD-10-CM | POA: Diagnosis not present

## 2020-04-01 DIAGNOSIS — Z992 Dependence on renal dialysis: Secondary | ICD-10-CM | POA: Diagnosis not present

## 2020-04-01 DIAGNOSIS — E7841 Elevated Lipoprotein(a): Secondary | ICD-10-CM | POA: Diagnosis not present

## 2020-04-01 DIAGNOSIS — E440 Moderate protein-calorie malnutrition: Secondary | ICD-10-CM | POA: Diagnosis not present

## 2020-04-01 DIAGNOSIS — N2589 Other disorders resulting from impaired renal tubular function: Secondary | ICD-10-CM | POA: Diagnosis not present

## 2020-04-01 DIAGNOSIS — D631 Anemia in chronic kidney disease: Secondary | ICD-10-CM | POA: Diagnosis not present

## 2020-04-02 ENCOUNTER — Other Ambulatory Visit: Payer: Self-pay

## 2020-04-02 DIAGNOSIS — Z79899 Other long term (current) drug therapy: Secondary | ICD-10-CM | POA: Diagnosis not present

## 2020-04-02 DIAGNOSIS — N186 End stage renal disease: Secondary | ICD-10-CM | POA: Diagnosis not present

## 2020-04-02 DIAGNOSIS — Z992 Dependence on renal dialysis: Secondary | ICD-10-CM | POA: Diagnosis not present

## 2020-04-02 DIAGNOSIS — E440 Moderate protein-calorie malnutrition: Secondary | ICD-10-CM | POA: Diagnosis not present

## 2020-04-02 DIAGNOSIS — D631 Anemia in chronic kidney disease: Secondary | ICD-10-CM | POA: Diagnosis not present

## 2020-04-02 DIAGNOSIS — K529 Noninfective gastroenteritis and colitis, unspecified: Secondary | ICD-10-CM

## 2020-04-02 DIAGNOSIS — N2581 Secondary hyperparathyroidism of renal origin: Secondary | ICD-10-CM | POA: Diagnosis not present

## 2020-04-02 DIAGNOSIS — N2589 Other disorders resulting from impaired renal tubular function: Secondary | ICD-10-CM | POA: Diagnosis not present

## 2020-04-02 DIAGNOSIS — D509 Iron deficiency anemia, unspecified: Secondary | ICD-10-CM | POA: Diagnosis not present

## 2020-04-02 DIAGNOSIS — E7841 Elevated Lipoprotein(a): Secondary | ICD-10-CM | POA: Diagnosis not present

## 2020-04-03 DIAGNOSIS — Z79899 Other long term (current) drug therapy: Secondary | ICD-10-CM | POA: Diagnosis not present

## 2020-04-03 DIAGNOSIS — N2581 Secondary hyperparathyroidism of renal origin: Secondary | ICD-10-CM | POA: Diagnosis not present

## 2020-04-03 DIAGNOSIS — D509 Iron deficiency anemia, unspecified: Secondary | ICD-10-CM | POA: Diagnosis not present

## 2020-04-03 DIAGNOSIS — D631 Anemia in chronic kidney disease: Secondary | ICD-10-CM | POA: Diagnosis not present

## 2020-04-03 DIAGNOSIS — E7841 Elevated Lipoprotein(a): Secondary | ICD-10-CM | POA: Diagnosis not present

## 2020-04-03 DIAGNOSIS — Z992 Dependence on renal dialysis: Secondary | ICD-10-CM | POA: Diagnosis not present

## 2020-04-03 DIAGNOSIS — E440 Moderate protein-calorie malnutrition: Secondary | ICD-10-CM | POA: Diagnosis not present

## 2020-04-03 DIAGNOSIS — N186 End stage renal disease: Secondary | ICD-10-CM | POA: Diagnosis not present

## 2020-04-03 DIAGNOSIS — N2589 Other disorders resulting from impaired renal tubular function: Secondary | ICD-10-CM | POA: Diagnosis not present

## 2020-04-04 DIAGNOSIS — E7841 Elevated Lipoprotein(a): Secondary | ICD-10-CM | POA: Diagnosis not present

## 2020-04-04 DIAGNOSIS — D631 Anemia in chronic kidney disease: Secondary | ICD-10-CM | POA: Diagnosis not present

## 2020-04-04 DIAGNOSIS — N186 End stage renal disease: Secondary | ICD-10-CM | POA: Diagnosis not present

## 2020-04-04 DIAGNOSIS — D509 Iron deficiency anemia, unspecified: Secondary | ICD-10-CM | POA: Diagnosis not present

## 2020-04-04 DIAGNOSIS — N2581 Secondary hyperparathyroidism of renal origin: Secondary | ICD-10-CM | POA: Diagnosis not present

## 2020-04-04 DIAGNOSIS — E440 Moderate protein-calorie malnutrition: Secondary | ICD-10-CM | POA: Diagnosis not present

## 2020-04-04 DIAGNOSIS — Z992 Dependence on renal dialysis: Secondary | ICD-10-CM | POA: Diagnosis not present

## 2020-04-04 DIAGNOSIS — Z79899 Other long term (current) drug therapy: Secondary | ICD-10-CM | POA: Diagnosis not present

## 2020-04-04 DIAGNOSIS — N2589 Other disorders resulting from impaired renal tubular function: Secondary | ICD-10-CM | POA: Diagnosis not present

## 2020-04-05 DIAGNOSIS — N186 End stage renal disease: Secondary | ICD-10-CM | POA: Diagnosis not present

## 2020-04-05 DIAGNOSIS — D509 Iron deficiency anemia, unspecified: Secondary | ICD-10-CM | POA: Diagnosis not present

## 2020-04-05 DIAGNOSIS — N2581 Secondary hyperparathyroidism of renal origin: Secondary | ICD-10-CM | POA: Diagnosis not present

## 2020-04-05 DIAGNOSIS — Z992 Dependence on renal dialysis: Secondary | ICD-10-CM | POA: Diagnosis not present

## 2020-04-05 DIAGNOSIS — D631 Anemia in chronic kidney disease: Secondary | ICD-10-CM | POA: Diagnosis not present

## 2020-04-05 DIAGNOSIS — Z79899 Other long term (current) drug therapy: Secondary | ICD-10-CM | POA: Diagnosis not present

## 2020-04-05 DIAGNOSIS — N2589 Other disorders resulting from impaired renal tubular function: Secondary | ICD-10-CM | POA: Diagnosis not present

## 2020-04-05 DIAGNOSIS — E7841 Elevated Lipoprotein(a): Secondary | ICD-10-CM | POA: Diagnosis not present

## 2020-04-05 DIAGNOSIS — E440 Moderate protein-calorie malnutrition: Secondary | ICD-10-CM | POA: Diagnosis not present

## 2020-04-06 DIAGNOSIS — E7841 Elevated Lipoprotein(a): Secondary | ICD-10-CM | POA: Diagnosis not present

## 2020-04-06 DIAGNOSIS — D509 Iron deficiency anemia, unspecified: Secondary | ICD-10-CM | POA: Diagnosis not present

## 2020-04-06 DIAGNOSIS — N186 End stage renal disease: Secondary | ICD-10-CM | POA: Diagnosis not present

## 2020-04-06 DIAGNOSIS — Z79899 Other long term (current) drug therapy: Secondary | ICD-10-CM | POA: Diagnosis not present

## 2020-04-06 DIAGNOSIS — N2581 Secondary hyperparathyroidism of renal origin: Secondary | ICD-10-CM | POA: Diagnosis not present

## 2020-04-06 DIAGNOSIS — N2589 Other disorders resulting from impaired renal tubular function: Secondary | ICD-10-CM | POA: Diagnosis not present

## 2020-04-06 DIAGNOSIS — E440 Moderate protein-calorie malnutrition: Secondary | ICD-10-CM | POA: Diagnosis not present

## 2020-04-06 DIAGNOSIS — I129 Hypertensive chronic kidney disease with stage 1 through stage 4 chronic kidney disease, or unspecified chronic kidney disease: Secondary | ICD-10-CM | POA: Diagnosis not present

## 2020-04-06 DIAGNOSIS — Z992 Dependence on renal dialysis: Secondary | ICD-10-CM | POA: Diagnosis not present

## 2020-04-06 DIAGNOSIS — D631 Anemia in chronic kidney disease: Secondary | ICD-10-CM | POA: Diagnosis not present

## 2020-04-07 DIAGNOSIS — Z992 Dependence on renal dialysis: Secondary | ICD-10-CM | POA: Diagnosis not present

## 2020-04-07 DIAGNOSIS — N2581 Secondary hyperparathyroidism of renal origin: Secondary | ICD-10-CM | POA: Diagnosis not present

## 2020-04-07 DIAGNOSIS — E440 Moderate protein-calorie malnutrition: Secondary | ICD-10-CM | POA: Diagnosis not present

## 2020-04-07 DIAGNOSIS — N2589 Other disorders resulting from impaired renal tubular function: Secondary | ICD-10-CM | POA: Diagnosis not present

## 2020-04-07 DIAGNOSIS — D509 Iron deficiency anemia, unspecified: Secondary | ICD-10-CM | POA: Diagnosis not present

## 2020-04-07 DIAGNOSIS — Z4932 Encounter for adequacy testing for peritoneal dialysis: Secondary | ICD-10-CM | POA: Diagnosis not present

## 2020-04-07 DIAGNOSIS — N186 End stage renal disease: Secondary | ICD-10-CM | POA: Diagnosis not present

## 2020-04-07 DIAGNOSIS — D631 Anemia in chronic kidney disease: Secondary | ICD-10-CM | POA: Diagnosis not present

## 2020-04-07 DIAGNOSIS — Z79899 Other long term (current) drug therapy: Secondary | ICD-10-CM | POA: Diagnosis not present

## 2020-04-08 DIAGNOSIS — Z992 Dependence on renal dialysis: Secondary | ICD-10-CM | POA: Diagnosis not present

## 2020-04-08 DIAGNOSIS — D509 Iron deficiency anemia, unspecified: Secondary | ICD-10-CM | POA: Diagnosis not present

## 2020-04-08 DIAGNOSIS — N2589 Other disorders resulting from impaired renal tubular function: Secondary | ICD-10-CM | POA: Diagnosis not present

## 2020-04-08 DIAGNOSIS — E440 Moderate protein-calorie malnutrition: Secondary | ICD-10-CM | POA: Diagnosis not present

## 2020-04-08 DIAGNOSIS — N2581 Secondary hyperparathyroidism of renal origin: Secondary | ICD-10-CM | POA: Diagnosis not present

## 2020-04-08 DIAGNOSIS — Z4932 Encounter for adequacy testing for peritoneal dialysis: Secondary | ICD-10-CM | POA: Diagnosis not present

## 2020-04-08 DIAGNOSIS — D631 Anemia in chronic kidney disease: Secondary | ICD-10-CM | POA: Diagnosis not present

## 2020-04-08 DIAGNOSIS — Z79899 Other long term (current) drug therapy: Secondary | ICD-10-CM | POA: Diagnosis not present

## 2020-04-08 DIAGNOSIS — N186 End stage renal disease: Secondary | ICD-10-CM | POA: Diagnosis not present

## 2020-04-09 DIAGNOSIS — N2581 Secondary hyperparathyroidism of renal origin: Secondary | ICD-10-CM | POA: Diagnosis not present

## 2020-04-09 DIAGNOSIS — Z79899 Other long term (current) drug therapy: Secondary | ICD-10-CM | POA: Diagnosis not present

## 2020-04-09 DIAGNOSIS — N186 End stage renal disease: Secondary | ICD-10-CM | POA: Diagnosis not present

## 2020-04-09 DIAGNOSIS — D509 Iron deficiency anemia, unspecified: Secondary | ICD-10-CM | POA: Diagnosis not present

## 2020-04-09 DIAGNOSIS — E440 Moderate protein-calorie malnutrition: Secondary | ICD-10-CM | POA: Diagnosis not present

## 2020-04-09 DIAGNOSIS — Z992 Dependence on renal dialysis: Secondary | ICD-10-CM | POA: Diagnosis not present

## 2020-04-09 DIAGNOSIS — Z4932 Encounter for adequacy testing for peritoneal dialysis: Secondary | ICD-10-CM | POA: Diagnosis not present

## 2020-04-09 DIAGNOSIS — D631 Anemia in chronic kidney disease: Secondary | ICD-10-CM | POA: Diagnosis not present

## 2020-04-09 DIAGNOSIS — N2589 Other disorders resulting from impaired renal tubular function: Secondary | ICD-10-CM | POA: Diagnosis not present

## 2020-04-10 DIAGNOSIS — E440 Moderate protein-calorie malnutrition: Secondary | ICD-10-CM | POA: Diagnosis not present

## 2020-04-10 DIAGNOSIS — D509 Iron deficiency anemia, unspecified: Secondary | ICD-10-CM | POA: Diagnosis not present

## 2020-04-10 DIAGNOSIS — N186 End stage renal disease: Secondary | ICD-10-CM | POA: Diagnosis not present

## 2020-04-10 DIAGNOSIS — Z992 Dependence on renal dialysis: Secondary | ICD-10-CM | POA: Diagnosis not present

## 2020-04-10 DIAGNOSIS — N2581 Secondary hyperparathyroidism of renal origin: Secondary | ICD-10-CM | POA: Diagnosis not present

## 2020-04-10 DIAGNOSIS — Z4932 Encounter for adequacy testing for peritoneal dialysis: Secondary | ICD-10-CM | POA: Diagnosis not present

## 2020-04-10 DIAGNOSIS — D631 Anemia in chronic kidney disease: Secondary | ICD-10-CM | POA: Diagnosis not present

## 2020-04-10 DIAGNOSIS — Z79899 Other long term (current) drug therapy: Secondary | ICD-10-CM | POA: Diagnosis not present

## 2020-04-10 DIAGNOSIS — N2589 Other disorders resulting from impaired renal tubular function: Secondary | ICD-10-CM | POA: Diagnosis not present

## 2020-04-11 DIAGNOSIS — E440 Moderate protein-calorie malnutrition: Secondary | ICD-10-CM | POA: Diagnosis not present

## 2020-04-11 DIAGNOSIS — N186 End stage renal disease: Secondary | ICD-10-CM | POA: Diagnosis not present

## 2020-04-11 DIAGNOSIS — Z992 Dependence on renal dialysis: Secondary | ICD-10-CM | POA: Diagnosis not present

## 2020-04-11 DIAGNOSIS — N2589 Other disorders resulting from impaired renal tubular function: Secondary | ICD-10-CM | POA: Diagnosis not present

## 2020-04-11 DIAGNOSIS — Z4932 Encounter for adequacy testing for peritoneal dialysis: Secondary | ICD-10-CM | POA: Diagnosis not present

## 2020-04-11 DIAGNOSIS — Z79899 Other long term (current) drug therapy: Secondary | ICD-10-CM | POA: Diagnosis not present

## 2020-04-11 DIAGNOSIS — D631 Anemia in chronic kidney disease: Secondary | ICD-10-CM | POA: Diagnosis not present

## 2020-04-11 DIAGNOSIS — N2581 Secondary hyperparathyroidism of renal origin: Secondary | ICD-10-CM | POA: Diagnosis not present

## 2020-04-11 DIAGNOSIS — D509 Iron deficiency anemia, unspecified: Secondary | ICD-10-CM | POA: Diagnosis not present

## 2020-04-12 DIAGNOSIS — E440 Moderate protein-calorie malnutrition: Secondary | ICD-10-CM | POA: Diagnosis not present

## 2020-04-12 DIAGNOSIS — D631 Anemia in chronic kidney disease: Secondary | ICD-10-CM | POA: Diagnosis not present

## 2020-04-12 DIAGNOSIS — N2589 Other disorders resulting from impaired renal tubular function: Secondary | ICD-10-CM | POA: Diagnosis not present

## 2020-04-12 DIAGNOSIS — Z4932 Encounter for adequacy testing for peritoneal dialysis: Secondary | ICD-10-CM | POA: Diagnosis not present

## 2020-04-12 DIAGNOSIS — N186 End stage renal disease: Secondary | ICD-10-CM | POA: Diagnosis not present

## 2020-04-12 DIAGNOSIS — Z992 Dependence on renal dialysis: Secondary | ICD-10-CM | POA: Diagnosis not present

## 2020-04-12 DIAGNOSIS — D509 Iron deficiency anemia, unspecified: Secondary | ICD-10-CM | POA: Diagnosis not present

## 2020-04-12 DIAGNOSIS — Z79899 Other long term (current) drug therapy: Secondary | ICD-10-CM | POA: Diagnosis not present

## 2020-04-12 DIAGNOSIS — N2581 Secondary hyperparathyroidism of renal origin: Secondary | ICD-10-CM | POA: Diagnosis not present

## 2020-04-13 DIAGNOSIS — E440 Moderate protein-calorie malnutrition: Secondary | ICD-10-CM | POA: Diagnosis not present

## 2020-04-13 DIAGNOSIS — Z992 Dependence on renal dialysis: Secondary | ICD-10-CM | POA: Diagnosis not present

## 2020-04-13 DIAGNOSIS — Z79899 Other long term (current) drug therapy: Secondary | ICD-10-CM | POA: Diagnosis not present

## 2020-04-13 DIAGNOSIS — N2581 Secondary hyperparathyroidism of renal origin: Secondary | ICD-10-CM | POA: Diagnosis not present

## 2020-04-13 DIAGNOSIS — Z4932 Encounter for adequacy testing for peritoneal dialysis: Secondary | ICD-10-CM | POA: Diagnosis not present

## 2020-04-13 DIAGNOSIS — N2589 Other disorders resulting from impaired renal tubular function: Secondary | ICD-10-CM | POA: Diagnosis not present

## 2020-04-13 DIAGNOSIS — D631 Anemia in chronic kidney disease: Secondary | ICD-10-CM | POA: Diagnosis not present

## 2020-04-13 DIAGNOSIS — N186 End stage renal disease: Secondary | ICD-10-CM | POA: Diagnosis not present

## 2020-04-13 DIAGNOSIS — D509 Iron deficiency anemia, unspecified: Secondary | ICD-10-CM | POA: Diagnosis not present

## 2020-04-14 DIAGNOSIS — N2589 Other disorders resulting from impaired renal tubular function: Secondary | ICD-10-CM | POA: Diagnosis not present

## 2020-04-14 DIAGNOSIS — Z992 Dependence on renal dialysis: Secondary | ICD-10-CM | POA: Diagnosis not present

## 2020-04-14 DIAGNOSIS — Z4932 Encounter for adequacy testing for peritoneal dialysis: Secondary | ICD-10-CM | POA: Diagnosis not present

## 2020-04-14 DIAGNOSIS — N186 End stage renal disease: Secondary | ICD-10-CM | POA: Diagnosis not present

## 2020-04-14 DIAGNOSIS — N2581 Secondary hyperparathyroidism of renal origin: Secondary | ICD-10-CM | POA: Diagnosis not present

## 2020-04-14 DIAGNOSIS — Z79899 Other long term (current) drug therapy: Secondary | ICD-10-CM | POA: Diagnosis not present

## 2020-04-14 DIAGNOSIS — D631 Anemia in chronic kidney disease: Secondary | ICD-10-CM | POA: Diagnosis not present

## 2020-04-14 DIAGNOSIS — E440 Moderate protein-calorie malnutrition: Secondary | ICD-10-CM | POA: Diagnosis not present

## 2020-04-14 DIAGNOSIS — D509 Iron deficiency anemia, unspecified: Secondary | ICD-10-CM | POA: Diagnosis not present

## 2020-04-15 DIAGNOSIS — Z4932 Encounter for adequacy testing for peritoneal dialysis: Secondary | ICD-10-CM | POA: Diagnosis not present

## 2020-04-15 DIAGNOSIS — Z992 Dependence on renal dialysis: Secondary | ICD-10-CM | POA: Diagnosis not present

## 2020-04-15 DIAGNOSIS — E440 Moderate protein-calorie malnutrition: Secondary | ICD-10-CM | POA: Diagnosis not present

## 2020-04-15 DIAGNOSIS — N186 End stage renal disease: Secondary | ICD-10-CM | POA: Diagnosis not present

## 2020-04-15 DIAGNOSIS — D509 Iron deficiency anemia, unspecified: Secondary | ICD-10-CM | POA: Diagnosis not present

## 2020-04-15 DIAGNOSIS — N2589 Other disorders resulting from impaired renal tubular function: Secondary | ICD-10-CM | POA: Diagnosis not present

## 2020-04-15 DIAGNOSIS — Z79899 Other long term (current) drug therapy: Secondary | ICD-10-CM | POA: Diagnosis not present

## 2020-04-15 DIAGNOSIS — D631 Anemia in chronic kidney disease: Secondary | ICD-10-CM | POA: Diagnosis not present

## 2020-04-15 DIAGNOSIS — N2581 Secondary hyperparathyroidism of renal origin: Secondary | ICD-10-CM | POA: Diagnosis not present

## 2020-04-16 DIAGNOSIS — E440 Moderate protein-calorie malnutrition: Secondary | ICD-10-CM | POA: Diagnosis not present

## 2020-04-16 DIAGNOSIS — Z4932 Encounter for adequacy testing for peritoneal dialysis: Secondary | ICD-10-CM | POA: Diagnosis not present

## 2020-04-16 DIAGNOSIS — Z79899 Other long term (current) drug therapy: Secondary | ICD-10-CM | POA: Diagnosis not present

## 2020-04-16 DIAGNOSIS — N2589 Other disorders resulting from impaired renal tubular function: Secondary | ICD-10-CM | POA: Diagnosis not present

## 2020-04-16 DIAGNOSIS — Z992 Dependence on renal dialysis: Secondary | ICD-10-CM | POA: Diagnosis not present

## 2020-04-16 DIAGNOSIS — N186 End stage renal disease: Secondary | ICD-10-CM | POA: Diagnosis not present

## 2020-04-16 DIAGNOSIS — D631 Anemia in chronic kidney disease: Secondary | ICD-10-CM | POA: Diagnosis not present

## 2020-04-16 DIAGNOSIS — N2581 Secondary hyperparathyroidism of renal origin: Secondary | ICD-10-CM | POA: Diagnosis not present

## 2020-04-16 DIAGNOSIS — D509 Iron deficiency anemia, unspecified: Secondary | ICD-10-CM | POA: Diagnosis not present

## 2020-04-17 DIAGNOSIS — Z992 Dependence on renal dialysis: Secondary | ICD-10-CM | POA: Diagnosis not present

## 2020-04-17 DIAGNOSIS — E440 Moderate protein-calorie malnutrition: Secondary | ICD-10-CM | POA: Diagnosis not present

## 2020-04-17 DIAGNOSIS — D631 Anemia in chronic kidney disease: Secondary | ICD-10-CM | POA: Diagnosis not present

## 2020-04-17 DIAGNOSIS — Z79899 Other long term (current) drug therapy: Secondary | ICD-10-CM | POA: Diagnosis not present

## 2020-04-17 DIAGNOSIS — N2589 Other disorders resulting from impaired renal tubular function: Secondary | ICD-10-CM | POA: Diagnosis not present

## 2020-04-17 DIAGNOSIS — N2581 Secondary hyperparathyroidism of renal origin: Secondary | ICD-10-CM | POA: Diagnosis not present

## 2020-04-17 DIAGNOSIS — D509 Iron deficiency anemia, unspecified: Secondary | ICD-10-CM | POA: Diagnosis not present

## 2020-04-17 DIAGNOSIS — N186 End stage renal disease: Secondary | ICD-10-CM | POA: Diagnosis not present

## 2020-04-17 DIAGNOSIS — Z4932 Encounter for adequacy testing for peritoneal dialysis: Secondary | ICD-10-CM | POA: Diagnosis not present

## 2020-04-18 DIAGNOSIS — Z4932 Encounter for adequacy testing for peritoneal dialysis: Secondary | ICD-10-CM | POA: Diagnosis not present

## 2020-04-18 DIAGNOSIS — D631 Anemia in chronic kidney disease: Secondary | ICD-10-CM | POA: Diagnosis not present

## 2020-04-18 DIAGNOSIS — Z79899 Other long term (current) drug therapy: Secondary | ICD-10-CM | POA: Diagnosis not present

## 2020-04-18 DIAGNOSIS — E440 Moderate protein-calorie malnutrition: Secondary | ICD-10-CM | POA: Diagnosis not present

## 2020-04-18 DIAGNOSIS — N2581 Secondary hyperparathyroidism of renal origin: Secondary | ICD-10-CM | POA: Diagnosis not present

## 2020-04-18 DIAGNOSIS — N186 End stage renal disease: Secondary | ICD-10-CM | POA: Diagnosis not present

## 2020-04-18 DIAGNOSIS — Z992 Dependence on renal dialysis: Secondary | ICD-10-CM | POA: Diagnosis not present

## 2020-04-18 DIAGNOSIS — D509 Iron deficiency anemia, unspecified: Secondary | ICD-10-CM | POA: Diagnosis not present

## 2020-04-18 DIAGNOSIS — N2589 Other disorders resulting from impaired renal tubular function: Secondary | ICD-10-CM | POA: Diagnosis not present

## 2020-04-19 DIAGNOSIS — Z4932 Encounter for adequacy testing for peritoneal dialysis: Secondary | ICD-10-CM | POA: Diagnosis not present

## 2020-04-19 DIAGNOSIS — Z992 Dependence on renal dialysis: Secondary | ICD-10-CM | POA: Diagnosis not present

## 2020-04-19 DIAGNOSIS — N2581 Secondary hyperparathyroidism of renal origin: Secondary | ICD-10-CM | POA: Diagnosis not present

## 2020-04-19 DIAGNOSIS — E440 Moderate protein-calorie malnutrition: Secondary | ICD-10-CM | POA: Diagnosis not present

## 2020-04-19 DIAGNOSIS — D631 Anemia in chronic kidney disease: Secondary | ICD-10-CM | POA: Diagnosis not present

## 2020-04-19 DIAGNOSIS — D509 Iron deficiency anemia, unspecified: Secondary | ICD-10-CM | POA: Diagnosis not present

## 2020-04-19 DIAGNOSIS — N2589 Other disorders resulting from impaired renal tubular function: Secondary | ICD-10-CM | POA: Diagnosis not present

## 2020-04-19 DIAGNOSIS — N186 End stage renal disease: Secondary | ICD-10-CM | POA: Diagnosis not present

## 2020-04-19 DIAGNOSIS — Z79899 Other long term (current) drug therapy: Secondary | ICD-10-CM | POA: Diagnosis not present

## 2020-04-20 DIAGNOSIS — D509 Iron deficiency anemia, unspecified: Secondary | ICD-10-CM | POA: Diagnosis not present

## 2020-04-20 DIAGNOSIS — D631 Anemia in chronic kidney disease: Secondary | ICD-10-CM | POA: Diagnosis not present

## 2020-04-20 DIAGNOSIS — Z4932 Encounter for adequacy testing for peritoneal dialysis: Secondary | ICD-10-CM | POA: Diagnosis not present

## 2020-04-20 DIAGNOSIS — N2589 Other disorders resulting from impaired renal tubular function: Secondary | ICD-10-CM | POA: Diagnosis not present

## 2020-04-20 DIAGNOSIS — N2581 Secondary hyperparathyroidism of renal origin: Secondary | ICD-10-CM | POA: Diagnosis not present

## 2020-04-20 DIAGNOSIS — Z79899 Other long term (current) drug therapy: Secondary | ICD-10-CM | POA: Diagnosis not present

## 2020-04-20 DIAGNOSIS — Z992 Dependence on renal dialysis: Secondary | ICD-10-CM | POA: Diagnosis not present

## 2020-04-20 DIAGNOSIS — N186 End stage renal disease: Secondary | ICD-10-CM | POA: Diagnosis not present

## 2020-04-20 DIAGNOSIS — E440 Moderate protein-calorie malnutrition: Secondary | ICD-10-CM | POA: Diagnosis not present

## 2020-04-21 DIAGNOSIS — E440 Moderate protein-calorie malnutrition: Secondary | ICD-10-CM | POA: Diagnosis not present

## 2020-04-21 DIAGNOSIS — D509 Iron deficiency anemia, unspecified: Secondary | ICD-10-CM | POA: Diagnosis not present

## 2020-04-21 DIAGNOSIS — N2581 Secondary hyperparathyroidism of renal origin: Secondary | ICD-10-CM | POA: Diagnosis not present

## 2020-04-21 DIAGNOSIS — N2589 Other disorders resulting from impaired renal tubular function: Secondary | ICD-10-CM | POA: Diagnosis not present

## 2020-04-21 DIAGNOSIS — Z79899 Other long term (current) drug therapy: Secondary | ICD-10-CM | POA: Diagnosis not present

## 2020-04-21 DIAGNOSIS — D631 Anemia in chronic kidney disease: Secondary | ICD-10-CM | POA: Diagnosis not present

## 2020-04-21 DIAGNOSIS — Z992 Dependence on renal dialysis: Secondary | ICD-10-CM | POA: Diagnosis not present

## 2020-04-21 DIAGNOSIS — Z4932 Encounter for adequacy testing for peritoneal dialysis: Secondary | ICD-10-CM | POA: Diagnosis not present

## 2020-04-21 DIAGNOSIS — N186 End stage renal disease: Secondary | ICD-10-CM | POA: Diagnosis not present

## 2020-04-22 DIAGNOSIS — N2581 Secondary hyperparathyroidism of renal origin: Secondary | ICD-10-CM | POA: Diagnosis not present

## 2020-04-22 DIAGNOSIS — D509 Iron deficiency anemia, unspecified: Secondary | ICD-10-CM | POA: Diagnosis not present

## 2020-04-22 DIAGNOSIS — D631 Anemia in chronic kidney disease: Secondary | ICD-10-CM | POA: Diagnosis not present

## 2020-04-22 DIAGNOSIS — E440 Moderate protein-calorie malnutrition: Secondary | ICD-10-CM | POA: Diagnosis not present

## 2020-04-22 DIAGNOSIS — Z992 Dependence on renal dialysis: Secondary | ICD-10-CM | POA: Diagnosis not present

## 2020-04-22 DIAGNOSIS — Z79899 Other long term (current) drug therapy: Secondary | ICD-10-CM | POA: Diagnosis not present

## 2020-04-22 DIAGNOSIS — Z4932 Encounter for adequacy testing for peritoneal dialysis: Secondary | ICD-10-CM | POA: Diagnosis not present

## 2020-04-22 DIAGNOSIS — N2589 Other disorders resulting from impaired renal tubular function: Secondary | ICD-10-CM | POA: Diagnosis not present

## 2020-04-22 DIAGNOSIS — N186 End stage renal disease: Secondary | ICD-10-CM | POA: Diagnosis not present

## 2020-04-23 DIAGNOSIS — Z4932 Encounter for adequacy testing for peritoneal dialysis: Secondary | ICD-10-CM | POA: Diagnosis not present

## 2020-04-23 DIAGNOSIS — N2589 Other disorders resulting from impaired renal tubular function: Secondary | ICD-10-CM | POA: Diagnosis not present

## 2020-04-23 DIAGNOSIS — N2581 Secondary hyperparathyroidism of renal origin: Secondary | ICD-10-CM | POA: Diagnosis not present

## 2020-04-23 DIAGNOSIS — D509 Iron deficiency anemia, unspecified: Secondary | ICD-10-CM | POA: Diagnosis not present

## 2020-04-23 DIAGNOSIS — Z992 Dependence on renal dialysis: Secondary | ICD-10-CM | POA: Diagnosis not present

## 2020-04-23 DIAGNOSIS — E440 Moderate protein-calorie malnutrition: Secondary | ICD-10-CM | POA: Diagnosis not present

## 2020-04-23 DIAGNOSIS — Z79899 Other long term (current) drug therapy: Secondary | ICD-10-CM | POA: Diagnosis not present

## 2020-04-23 DIAGNOSIS — N186 End stage renal disease: Secondary | ICD-10-CM | POA: Diagnosis not present

## 2020-04-23 DIAGNOSIS — D631 Anemia in chronic kidney disease: Secondary | ICD-10-CM | POA: Diagnosis not present

## 2020-04-24 DIAGNOSIS — D631 Anemia in chronic kidney disease: Secondary | ICD-10-CM | POA: Diagnosis not present

## 2020-04-24 DIAGNOSIS — Z4932 Encounter for adequacy testing for peritoneal dialysis: Secondary | ICD-10-CM | POA: Diagnosis not present

## 2020-04-24 DIAGNOSIS — N2589 Other disorders resulting from impaired renal tubular function: Secondary | ICD-10-CM | POA: Diagnosis not present

## 2020-04-24 DIAGNOSIS — Z992 Dependence on renal dialysis: Secondary | ICD-10-CM | POA: Diagnosis not present

## 2020-04-24 DIAGNOSIS — N2581 Secondary hyperparathyroidism of renal origin: Secondary | ICD-10-CM | POA: Diagnosis not present

## 2020-04-24 DIAGNOSIS — E440 Moderate protein-calorie malnutrition: Secondary | ICD-10-CM | POA: Diagnosis not present

## 2020-04-24 DIAGNOSIS — N186 End stage renal disease: Secondary | ICD-10-CM | POA: Diagnosis not present

## 2020-04-24 DIAGNOSIS — D509 Iron deficiency anemia, unspecified: Secondary | ICD-10-CM | POA: Diagnosis not present

## 2020-04-24 DIAGNOSIS — Z79899 Other long term (current) drug therapy: Secondary | ICD-10-CM | POA: Diagnosis not present

## 2020-04-25 DIAGNOSIS — E440 Moderate protein-calorie malnutrition: Secondary | ICD-10-CM | POA: Diagnosis not present

## 2020-04-25 DIAGNOSIS — N2589 Other disorders resulting from impaired renal tubular function: Secondary | ICD-10-CM | POA: Diagnosis not present

## 2020-04-25 DIAGNOSIS — D631 Anemia in chronic kidney disease: Secondary | ICD-10-CM | POA: Diagnosis not present

## 2020-04-25 DIAGNOSIS — N2581 Secondary hyperparathyroidism of renal origin: Secondary | ICD-10-CM | POA: Diagnosis not present

## 2020-04-25 DIAGNOSIS — Z4932 Encounter for adequacy testing for peritoneal dialysis: Secondary | ICD-10-CM | POA: Diagnosis not present

## 2020-04-25 DIAGNOSIS — Z992 Dependence on renal dialysis: Secondary | ICD-10-CM | POA: Diagnosis not present

## 2020-04-25 DIAGNOSIS — Z79899 Other long term (current) drug therapy: Secondary | ICD-10-CM | POA: Diagnosis not present

## 2020-04-25 DIAGNOSIS — N186 End stage renal disease: Secondary | ICD-10-CM | POA: Diagnosis not present

## 2020-04-25 DIAGNOSIS — D509 Iron deficiency anemia, unspecified: Secondary | ICD-10-CM | POA: Diagnosis not present

## 2020-04-26 DIAGNOSIS — Z4932 Encounter for adequacy testing for peritoneal dialysis: Secondary | ICD-10-CM | POA: Diagnosis not present

## 2020-04-26 DIAGNOSIS — D509 Iron deficiency anemia, unspecified: Secondary | ICD-10-CM | POA: Diagnosis not present

## 2020-04-26 DIAGNOSIS — N2581 Secondary hyperparathyroidism of renal origin: Secondary | ICD-10-CM | POA: Diagnosis not present

## 2020-04-26 DIAGNOSIS — D631 Anemia in chronic kidney disease: Secondary | ICD-10-CM | POA: Diagnosis not present

## 2020-04-26 DIAGNOSIS — E440 Moderate protein-calorie malnutrition: Secondary | ICD-10-CM | POA: Diagnosis not present

## 2020-04-26 DIAGNOSIS — Z79899 Other long term (current) drug therapy: Secondary | ICD-10-CM | POA: Diagnosis not present

## 2020-04-26 DIAGNOSIS — N186 End stage renal disease: Secondary | ICD-10-CM | POA: Diagnosis not present

## 2020-04-26 DIAGNOSIS — N2589 Other disorders resulting from impaired renal tubular function: Secondary | ICD-10-CM | POA: Diagnosis not present

## 2020-04-26 DIAGNOSIS — Z992 Dependence on renal dialysis: Secondary | ICD-10-CM | POA: Diagnosis not present

## 2020-04-27 DIAGNOSIS — E440 Moderate protein-calorie malnutrition: Secondary | ICD-10-CM | POA: Diagnosis not present

## 2020-04-27 DIAGNOSIS — Z4932 Encounter for adequacy testing for peritoneal dialysis: Secondary | ICD-10-CM | POA: Diagnosis not present

## 2020-04-27 DIAGNOSIS — N2581 Secondary hyperparathyroidism of renal origin: Secondary | ICD-10-CM | POA: Diagnosis not present

## 2020-04-27 DIAGNOSIS — N2589 Other disorders resulting from impaired renal tubular function: Secondary | ICD-10-CM | POA: Diagnosis not present

## 2020-04-27 DIAGNOSIS — D631 Anemia in chronic kidney disease: Secondary | ICD-10-CM | POA: Diagnosis not present

## 2020-04-27 DIAGNOSIS — Z79899 Other long term (current) drug therapy: Secondary | ICD-10-CM | POA: Diagnosis not present

## 2020-04-27 DIAGNOSIS — D509 Iron deficiency anemia, unspecified: Secondary | ICD-10-CM | POA: Diagnosis not present

## 2020-04-27 DIAGNOSIS — Z992 Dependence on renal dialysis: Secondary | ICD-10-CM | POA: Diagnosis not present

## 2020-04-27 DIAGNOSIS — N186 End stage renal disease: Secondary | ICD-10-CM | POA: Diagnosis not present

## 2020-04-28 DIAGNOSIS — N2581 Secondary hyperparathyroidism of renal origin: Secondary | ICD-10-CM | POA: Diagnosis not present

## 2020-04-28 DIAGNOSIS — Z79899 Other long term (current) drug therapy: Secondary | ICD-10-CM | POA: Diagnosis not present

## 2020-04-28 DIAGNOSIS — E440 Moderate protein-calorie malnutrition: Secondary | ICD-10-CM | POA: Diagnosis not present

## 2020-04-28 DIAGNOSIS — Z4932 Encounter for adequacy testing for peritoneal dialysis: Secondary | ICD-10-CM | POA: Diagnosis not present

## 2020-04-28 DIAGNOSIS — Z992 Dependence on renal dialysis: Secondary | ICD-10-CM | POA: Diagnosis not present

## 2020-04-28 DIAGNOSIS — D631 Anemia in chronic kidney disease: Secondary | ICD-10-CM | POA: Diagnosis not present

## 2020-04-28 DIAGNOSIS — D509 Iron deficiency anemia, unspecified: Secondary | ICD-10-CM | POA: Diagnosis not present

## 2020-04-28 DIAGNOSIS — N186 End stage renal disease: Secondary | ICD-10-CM | POA: Diagnosis not present

## 2020-04-28 DIAGNOSIS — N2589 Other disorders resulting from impaired renal tubular function: Secondary | ICD-10-CM | POA: Diagnosis not present

## 2020-04-29 DIAGNOSIS — N2581 Secondary hyperparathyroidism of renal origin: Secondary | ICD-10-CM | POA: Diagnosis not present

## 2020-04-29 DIAGNOSIS — N186 End stage renal disease: Secondary | ICD-10-CM | POA: Diagnosis not present

## 2020-04-29 DIAGNOSIS — Z992 Dependence on renal dialysis: Secondary | ICD-10-CM | POA: Diagnosis not present

## 2020-04-29 DIAGNOSIS — D509 Iron deficiency anemia, unspecified: Secondary | ICD-10-CM | POA: Diagnosis not present

## 2020-04-29 DIAGNOSIS — N2589 Other disorders resulting from impaired renal tubular function: Secondary | ICD-10-CM | POA: Diagnosis not present

## 2020-04-29 DIAGNOSIS — E440 Moderate protein-calorie malnutrition: Secondary | ICD-10-CM | POA: Diagnosis not present

## 2020-04-29 DIAGNOSIS — Z4932 Encounter for adequacy testing for peritoneal dialysis: Secondary | ICD-10-CM | POA: Diagnosis not present

## 2020-04-29 DIAGNOSIS — Z79899 Other long term (current) drug therapy: Secondary | ICD-10-CM | POA: Diagnosis not present

## 2020-04-29 DIAGNOSIS — D631 Anemia in chronic kidney disease: Secondary | ICD-10-CM | POA: Diagnosis not present

## 2020-04-30 DIAGNOSIS — N2589 Other disorders resulting from impaired renal tubular function: Secondary | ICD-10-CM | POA: Diagnosis not present

## 2020-04-30 DIAGNOSIS — Z992 Dependence on renal dialysis: Secondary | ICD-10-CM | POA: Diagnosis not present

## 2020-04-30 DIAGNOSIS — D509 Iron deficiency anemia, unspecified: Secondary | ICD-10-CM | POA: Diagnosis not present

## 2020-04-30 DIAGNOSIS — N2581 Secondary hyperparathyroidism of renal origin: Secondary | ICD-10-CM | POA: Diagnosis not present

## 2020-04-30 DIAGNOSIS — Z4932 Encounter for adequacy testing for peritoneal dialysis: Secondary | ICD-10-CM | POA: Diagnosis not present

## 2020-04-30 DIAGNOSIS — D631 Anemia in chronic kidney disease: Secondary | ICD-10-CM | POA: Diagnosis not present

## 2020-04-30 DIAGNOSIS — Z79899 Other long term (current) drug therapy: Secondary | ICD-10-CM | POA: Diagnosis not present

## 2020-04-30 DIAGNOSIS — N186 End stage renal disease: Secondary | ICD-10-CM | POA: Diagnosis not present

## 2020-04-30 DIAGNOSIS — E440 Moderate protein-calorie malnutrition: Secondary | ICD-10-CM | POA: Diagnosis not present

## 2020-05-01 DIAGNOSIS — Z4932 Encounter for adequacy testing for peritoneal dialysis: Secondary | ICD-10-CM | POA: Diagnosis not present

## 2020-05-01 DIAGNOSIS — N186 End stage renal disease: Secondary | ICD-10-CM | POA: Diagnosis not present

## 2020-05-01 DIAGNOSIS — Z992 Dependence on renal dialysis: Secondary | ICD-10-CM | POA: Diagnosis not present

## 2020-05-01 DIAGNOSIS — D631 Anemia in chronic kidney disease: Secondary | ICD-10-CM | POA: Diagnosis not present

## 2020-05-01 DIAGNOSIS — Z79899 Other long term (current) drug therapy: Secondary | ICD-10-CM | POA: Diagnosis not present

## 2020-05-01 DIAGNOSIS — D509 Iron deficiency anemia, unspecified: Secondary | ICD-10-CM | POA: Diagnosis not present

## 2020-05-01 DIAGNOSIS — E440 Moderate protein-calorie malnutrition: Secondary | ICD-10-CM | POA: Diagnosis not present

## 2020-05-01 DIAGNOSIS — N2581 Secondary hyperparathyroidism of renal origin: Secondary | ICD-10-CM | POA: Diagnosis not present

## 2020-05-01 DIAGNOSIS — N2589 Other disorders resulting from impaired renal tubular function: Secondary | ICD-10-CM | POA: Diagnosis not present

## 2020-05-02 DIAGNOSIS — D509 Iron deficiency anemia, unspecified: Secondary | ICD-10-CM | POA: Diagnosis not present

## 2020-05-02 DIAGNOSIS — Z4932 Encounter for adequacy testing for peritoneal dialysis: Secondary | ICD-10-CM | POA: Diagnosis not present

## 2020-05-02 DIAGNOSIS — D631 Anemia in chronic kidney disease: Secondary | ICD-10-CM | POA: Diagnosis not present

## 2020-05-02 DIAGNOSIS — N186 End stage renal disease: Secondary | ICD-10-CM | POA: Diagnosis not present

## 2020-05-02 DIAGNOSIS — Z992 Dependence on renal dialysis: Secondary | ICD-10-CM | POA: Diagnosis not present

## 2020-05-02 DIAGNOSIS — N2589 Other disorders resulting from impaired renal tubular function: Secondary | ICD-10-CM | POA: Diagnosis not present

## 2020-05-02 DIAGNOSIS — E440 Moderate protein-calorie malnutrition: Secondary | ICD-10-CM | POA: Diagnosis not present

## 2020-05-02 DIAGNOSIS — N2581 Secondary hyperparathyroidism of renal origin: Secondary | ICD-10-CM | POA: Diagnosis not present

## 2020-05-02 DIAGNOSIS — Z79899 Other long term (current) drug therapy: Secondary | ICD-10-CM | POA: Diagnosis not present

## 2020-05-03 DIAGNOSIS — D509 Iron deficiency anemia, unspecified: Secondary | ICD-10-CM | POA: Diagnosis not present

## 2020-05-03 DIAGNOSIS — N186 End stage renal disease: Secondary | ICD-10-CM | POA: Diagnosis not present

## 2020-05-03 DIAGNOSIS — N2589 Other disorders resulting from impaired renal tubular function: Secondary | ICD-10-CM | POA: Diagnosis not present

## 2020-05-03 DIAGNOSIS — N2581 Secondary hyperparathyroidism of renal origin: Secondary | ICD-10-CM | POA: Diagnosis not present

## 2020-05-03 DIAGNOSIS — E440 Moderate protein-calorie malnutrition: Secondary | ICD-10-CM | POA: Diagnosis not present

## 2020-05-03 DIAGNOSIS — Z79899 Other long term (current) drug therapy: Secondary | ICD-10-CM | POA: Diagnosis not present

## 2020-05-03 DIAGNOSIS — Z4932 Encounter for adequacy testing for peritoneal dialysis: Secondary | ICD-10-CM | POA: Diagnosis not present

## 2020-05-03 DIAGNOSIS — D631 Anemia in chronic kidney disease: Secondary | ICD-10-CM | POA: Diagnosis not present

## 2020-05-03 DIAGNOSIS — Z992 Dependence on renal dialysis: Secondary | ICD-10-CM | POA: Diagnosis not present

## 2020-05-04 DIAGNOSIS — E440 Moderate protein-calorie malnutrition: Secondary | ICD-10-CM | POA: Diagnosis not present

## 2020-05-04 DIAGNOSIS — Z4932 Encounter for adequacy testing for peritoneal dialysis: Secondary | ICD-10-CM | POA: Diagnosis not present

## 2020-05-04 DIAGNOSIS — Z79899 Other long term (current) drug therapy: Secondary | ICD-10-CM | POA: Diagnosis not present

## 2020-05-04 DIAGNOSIS — D509 Iron deficiency anemia, unspecified: Secondary | ICD-10-CM | POA: Diagnosis not present

## 2020-05-04 DIAGNOSIS — N2589 Other disorders resulting from impaired renal tubular function: Secondary | ICD-10-CM | POA: Diagnosis not present

## 2020-05-04 DIAGNOSIS — N186 End stage renal disease: Secondary | ICD-10-CM | POA: Diagnosis not present

## 2020-05-04 DIAGNOSIS — Z992 Dependence on renal dialysis: Secondary | ICD-10-CM | POA: Diagnosis not present

## 2020-05-04 DIAGNOSIS — D631 Anemia in chronic kidney disease: Secondary | ICD-10-CM | POA: Diagnosis not present

## 2020-05-04 DIAGNOSIS — N2581 Secondary hyperparathyroidism of renal origin: Secondary | ICD-10-CM | POA: Diagnosis not present

## 2020-05-05 DIAGNOSIS — D509 Iron deficiency anemia, unspecified: Secondary | ICD-10-CM | POA: Diagnosis not present

## 2020-05-05 DIAGNOSIS — Z4932 Encounter for adequacy testing for peritoneal dialysis: Secondary | ICD-10-CM | POA: Diagnosis not present

## 2020-05-05 DIAGNOSIS — Z992 Dependence on renal dialysis: Secondary | ICD-10-CM | POA: Diagnosis not present

## 2020-05-05 DIAGNOSIS — N2581 Secondary hyperparathyroidism of renal origin: Secondary | ICD-10-CM | POA: Diagnosis not present

## 2020-05-05 DIAGNOSIS — N186 End stage renal disease: Secondary | ICD-10-CM | POA: Diagnosis not present

## 2020-05-05 DIAGNOSIS — Z79899 Other long term (current) drug therapy: Secondary | ICD-10-CM | POA: Diagnosis not present

## 2020-05-05 DIAGNOSIS — D631 Anemia in chronic kidney disease: Secondary | ICD-10-CM | POA: Diagnosis not present

## 2020-05-05 DIAGNOSIS — N2589 Other disorders resulting from impaired renal tubular function: Secondary | ICD-10-CM | POA: Diagnosis not present

## 2020-05-05 DIAGNOSIS — E440 Moderate protein-calorie malnutrition: Secondary | ICD-10-CM | POA: Diagnosis not present

## 2020-05-06 DIAGNOSIS — N2589 Other disorders resulting from impaired renal tubular function: Secondary | ICD-10-CM | POA: Diagnosis not present

## 2020-05-06 DIAGNOSIS — Z79899 Other long term (current) drug therapy: Secondary | ICD-10-CM | POA: Diagnosis not present

## 2020-05-06 DIAGNOSIS — Z992 Dependence on renal dialysis: Secondary | ICD-10-CM | POA: Diagnosis not present

## 2020-05-06 DIAGNOSIS — E440 Moderate protein-calorie malnutrition: Secondary | ICD-10-CM | POA: Diagnosis not present

## 2020-05-06 DIAGNOSIS — D631 Anemia in chronic kidney disease: Secondary | ICD-10-CM | POA: Diagnosis not present

## 2020-05-06 DIAGNOSIS — N186 End stage renal disease: Secondary | ICD-10-CM | POA: Diagnosis not present

## 2020-05-06 DIAGNOSIS — D509 Iron deficiency anemia, unspecified: Secondary | ICD-10-CM | POA: Diagnosis not present

## 2020-05-06 DIAGNOSIS — N2581 Secondary hyperparathyroidism of renal origin: Secondary | ICD-10-CM | POA: Diagnosis not present

## 2020-05-06 DIAGNOSIS — Z4932 Encounter for adequacy testing for peritoneal dialysis: Secondary | ICD-10-CM | POA: Diagnosis not present

## 2020-05-07 DIAGNOSIS — I129 Hypertensive chronic kidney disease with stage 1 through stage 4 chronic kidney disease, or unspecified chronic kidney disease: Secondary | ICD-10-CM | POA: Diagnosis not present

## 2020-05-07 DIAGNOSIS — Z79899 Other long term (current) drug therapy: Secondary | ICD-10-CM | POA: Diagnosis not present

## 2020-05-07 DIAGNOSIS — Z992 Dependence on renal dialysis: Secondary | ICD-10-CM | POA: Diagnosis not present

## 2020-05-07 DIAGNOSIS — N186 End stage renal disease: Secondary | ICD-10-CM | POA: Diagnosis not present

## 2020-05-07 DIAGNOSIS — D509 Iron deficiency anemia, unspecified: Secondary | ICD-10-CM | POA: Diagnosis not present

## 2020-05-07 DIAGNOSIS — D631 Anemia in chronic kidney disease: Secondary | ICD-10-CM | POA: Diagnosis not present

## 2020-05-07 DIAGNOSIS — N2581 Secondary hyperparathyroidism of renal origin: Secondary | ICD-10-CM | POA: Diagnosis not present

## 2020-05-07 DIAGNOSIS — E440 Moderate protein-calorie malnutrition: Secondary | ICD-10-CM | POA: Diagnosis not present

## 2020-05-07 DIAGNOSIS — N2589 Other disorders resulting from impaired renal tubular function: Secondary | ICD-10-CM | POA: Diagnosis not present

## 2020-05-07 DIAGNOSIS — Z4932 Encounter for adequacy testing for peritoneal dialysis: Secondary | ICD-10-CM | POA: Diagnosis not present

## 2020-05-08 DIAGNOSIS — Z79899 Other long term (current) drug therapy: Secondary | ICD-10-CM | POA: Diagnosis not present

## 2020-05-08 DIAGNOSIS — N2581 Secondary hyperparathyroidism of renal origin: Secondary | ICD-10-CM | POA: Diagnosis not present

## 2020-05-08 DIAGNOSIS — Z992 Dependence on renal dialysis: Secondary | ICD-10-CM | POA: Diagnosis not present

## 2020-05-08 DIAGNOSIS — D509 Iron deficiency anemia, unspecified: Secondary | ICD-10-CM | POA: Diagnosis not present

## 2020-05-08 DIAGNOSIS — N2589 Other disorders resulting from impaired renal tubular function: Secondary | ICD-10-CM | POA: Diagnosis not present

## 2020-05-08 DIAGNOSIS — D631 Anemia in chronic kidney disease: Secondary | ICD-10-CM | POA: Diagnosis not present

## 2020-05-08 DIAGNOSIS — N186 End stage renal disease: Secondary | ICD-10-CM | POA: Diagnosis not present

## 2020-05-08 DIAGNOSIS — E7841 Elevated Lipoprotein(a): Secondary | ICD-10-CM | POA: Diagnosis not present

## 2020-05-08 DIAGNOSIS — E440 Moderate protein-calorie malnutrition: Secondary | ICD-10-CM | POA: Diagnosis not present

## 2020-05-09 ENCOUNTER — Encounter: Payer: Self-pay | Admitting: Gastroenterology

## 2020-05-09 ENCOUNTER — Ambulatory Visit: Payer: Medicare Other | Admitting: Gastroenterology

## 2020-05-09 DIAGNOSIS — D509 Iron deficiency anemia, unspecified: Secondary | ICD-10-CM | POA: Diagnosis not present

## 2020-05-09 DIAGNOSIS — Z79899 Other long term (current) drug therapy: Secondary | ICD-10-CM | POA: Diagnosis not present

## 2020-05-09 DIAGNOSIS — D631 Anemia in chronic kidney disease: Secondary | ICD-10-CM | POA: Diagnosis not present

## 2020-05-09 DIAGNOSIS — E7841 Elevated Lipoprotein(a): Secondary | ICD-10-CM | POA: Diagnosis not present

## 2020-05-09 DIAGNOSIS — N2589 Other disorders resulting from impaired renal tubular function: Secondary | ICD-10-CM | POA: Diagnosis not present

## 2020-05-09 DIAGNOSIS — N2581 Secondary hyperparathyroidism of renal origin: Secondary | ICD-10-CM | POA: Diagnosis not present

## 2020-05-09 DIAGNOSIS — N186 End stage renal disease: Secondary | ICD-10-CM | POA: Diagnosis not present

## 2020-05-09 DIAGNOSIS — E440 Moderate protein-calorie malnutrition: Secondary | ICD-10-CM | POA: Diagnosis not present

## 2020-05-09 DIAGNOSIS — Z992 Dependence on renal dialysis: Secondary | ICD-10-CM | POA: Diagnosis not present

## 2020-05-10 DIAGNOSIS — N2581 Secondary hyperparathyroidism of renal origin: Secondary | ICD-10-CM | POA: Diagnosis not present

## 2020-05-10 DIAGNOSIS — D509 Iron deficiency anemia, unspecified: Secondary | ICD-10-CM | POA: Diagnosis not present

## 2020-05-10 DIAGNOSIS — N186 End stage renal disease: Secondary | ICD-10-CM | POA: Diagnosis not present

## 2020-05-10 DIAGNOSIS — E440 Moderate protein-calorie malnutrition: Secondary | ICD-10-CM | POA: Diagnosis not present

## 2020-05-10 DIAGNOSIS — Z992 Dependence on renal dialysis: Secondary | ICD-10-CM | POA: Diagnosis not present

## 2020-05-10 DIAGNOSIS — N2589 Other disorders resulting from impaired renal tubular function: Secondary | ICD-10-CM | POA: Diagnosis not present

## 2020-05-10 DIAGNOSIS — Z79899 Other long term (current) drug therapy: Secondary | ICD-10-CM | POA: Diagnosis not present

## 2020-05-10 DIAGNOSIS — E7841 Elevated Lipoprotein(a): Secondary | ICD-10-CM | POA: Diagnosis not present

## 2020-05-10 DIAGNOSIS — D631 Anemia in chronic kidney disease: Secondary | ICD-10-CM | POA: Diagnosis not present

## 2020-05-11 DIAGNOSIS — N2581 Secondary hyperparathyroidism of renal origin: Secondary | ICD-10-CM | POA: Diagnosis not present

## 2020-05-11 DIAGNOSIS — Z992 Dependence on renal dialysis: Secondary | ICD-10-CM | POA: Diagnosis not present

## 2020-05-11 DIAGNOSIS — D631 Anemia in chronic kidney disease: Secondary | ICD-10-CM | POA: Diagnosis not present

## 2020-05-11 DIAGNOSIS — N186 End stage renal disease: Secondary | ICD-10-CM | POA: Diagnosis not present

## 2020-05-11 DIAGNOSIS — E7841 Elevated Lipoprotein(a): Secondary | ICD-10-CM | POA: Diagnosis not present

## 2020-05-11 DIAGNOSIS — D509 Iron deficiency anemia, unspecified: Secondary | ICD-10-CM | POA: Diagnosis not present

## 2020-05-11 DIAGNOSIS — E440 Moderate protein-calorie malnutrition: Secondary | ICD-10-CM | POA: Diagnosis not present

## 2020-05-11 DIAGNOSIS — N2589 Other disorders resulting from impaired renal tubular function: Secondary | ICD-10-CM | POA: Diagnosis not present

## 2020-05-11 DIAGNOSIS — Z79899 Other long term (current) drug therapy: Secondary | ICD-10-CM | POA: Diagnosis not present

## 2020-05-12 DIAGNOSIS — E7841 Elevated Lipoprotein(a): Secondary | ICD-10-CM | POA: Diagnosis not present

## 2020-05-12 DIAGNOSIS — D509 Iron deficiency anemia, unspecified: Secondary | ICD-10-CM | POA: Diagnosis not present

## 2020-05-12 DIAGNOSIS — E440 Moderate protein-calorie malnutrition: Secondary | ICD-10-CM | POA: Diagnosis not present

## 2020-05-12 DIAGNOSIS — N2581 Secondary hyperparathyroidism of renal origin: Secondary | ICD-10-CM | POA: Diagnosis not present

## 2020-05-12 DIAGNOSIS — D631 Anemia in chronic kidney disease: Secondary | ICD-10-CM | POA: Diagnosis not present

## 2020-05-12 DIAGNOSIS — N2589 Other disorders resulting from impaired renal tubular function: Secondary | ICD-10-CM | POA: Diagnosis not present

## 2020-05-12 DIAGNOSIS — Z79899 Other long term (current) drug therapy: Secondary | ICD-10-CM | POA: Diagnosis not present

## 2020-05-12 DIAGNOSIS — N186 End stage renal disease: Secondary | ICD-10-CM | POA: Diagnosis not present

## 2020-05-12 DIAGNOSIS — Z992 Dependence on renal dialysis: Secondary | ICD-10-CM | POA: Diagnosis not present

## 2020-05-13 DIAGNOSIS — D509 Iron deficiency anemia, unspecified: Secondary | ICD-10-CM | POA: Diagnosis not present

## 2020-05-13 DIAGNOSIS — N186 End stage renal disease: Secondary | ICD-10-CM | POA: Diagnosis not present

## 2020-05-13 DIAGNOSIS — Z992 Dependence on renal dialysis: Secondary | ICD-10-CM | POA: Diagnosis not present

## 2020-05-13 DIAGNOSIS — N2581 Secondary hyperparathyroidism of renal origin: Secondary | ICD-10-CM | POA: Diagnosis not present

## 2020-05-13 DIAGNOSIS — Z79899 Other long term (current) drug therapy: Secondary | ICD-10-CM | POA: Diagnosis not present

## 2020-05-13 DIAGNOSIS — E440 Moderate protein-calorie malnutrition: Secondary | ICD-10-CM | POA: Diagnosis not present

## 2020-05-13 DIAGNOSIS — E7841 Elevated Lipoprotein(a): Secondary | ICD-10-CM | POA: Diagnosis not present

## 2020-05-13 DIAGNOSIS — D631 Anemia in chronic kidney disease: Secondary | ICD-10-CM | POA: Diagnosis not present

## 2020-05-13 DIAGNOSIS — N2589 Other disorders resulting from impaired renal tubular function: Secondary | ICD-10-CM | POA: Diagnosis not present

## 2020-05-14 DIAGNOSIS — N2581 Secondary hyperparathyroidism of renal origin: Secondary | ICD-10-CM | POA: Diagnosis not present

## 2020-05-14 DIAGNOSIS — E7841 Elevated Lipoprotein(a): Secondary | ICD-10-CM | POA: Diagnosis not present

## 2020-05-14 DIAGNOSIS — E440 Moderate protein-calorie malnutrition: Secondary | ICD-10-CM | POA: Diagnosis not present

## 2020-05-14 DIAGNOSIS — Z992 Dependence on renal dialysis: Secondary | ICD-10-CM | POA: Diagnosis not present

## 2020-05-14 DIAGNOSIS — Z79899 Other long term (current) drug therapy: Secondary | ICD-10-CM | POA: Diagnosis not present

## 2020-05-14 DIAGNOSIS — N186 End stage renal disease: Secondary | ICD-10-CM | POA: Diagnosis not present

## 2020-05-14 DIAGNOSIS — D631 Anemia in chronic kidney disease: Secondary | ICD-10-CM | POA: Diagnosis not present

## 2020-05-14 DIAGNOSIS — D509 Iron deficiency anemia, unspecified: Secondary | ICD-10-CM | POA: Diagnosis not present

## 2020-05-14 DIAGNOSIS — N2589 Other disorders resulting from impaired renal tubular function: Secondary | ICD-10-CM | POA: Diagnosis not present

## 2020-05-15 DIAGNOSIS — E7841 Elevated Lipoprotein(a): Secondary | ICD-10-CM | POA: Diagnosis not present

## 2020-05-15 DIAGNOSIS — N2581 Secondary hyperparathyroidism of renal origin: Secondary | ICD-10-CM | POA: Diagnosis not present

## 2020-05-15 DIAGNOSIS — N2589 Other disorders resulting from impaired renal tubular function: Secondary | ICD-10-CM | POA: Diagnosis not present

## 2020-05-15 DIAGNOSIS — D509 Iron deficiency anemia, unspecified: Secondary | ICD-10-CM | POA: Diagnosis not present

## 2020-05-15 DIAGNOSIS — Z992 Dependence on renal dialysis: Secondary | ICD-10-CM | POA: Diagnosis not present

## 2020-05-15 DIAGNOSIS — E440 Moderate protein-calorie malnutrition: Secondary | ICD-10-CM | POA: Diagnosis not present

## 2020-05-15 DIAGNOSIS — D631 Anemia in chronic kidney disease: Secondary | ICD-10-CM | POA: Diagnosis not present

## 2020-05-15 DIAGNOSIS — Z79899 Other long term (current) drug therapy: Secondary | ICD-10-CM | POA: Diagnosis not present

## 2020-05-15 DIAGNOSIS — N186 End stage renal disease: Secondary | ICD-10-CM | POA: Diagnosis not present

## 2020-05-16 DIAGNOSIS — N2589 Other disorders resulting from impaired renal tubular function: Secondary | ICD-10-CM | POA: Diagnosis not present

## 2020-05-16 DIAGNOSIS — E440 Moderate protein-calorie malnutrition: Secondary | ICD-10-CM | POA: Diagnosis not present

## 2020-05-16 DIAGNOSIS — E7841 Elevated Lipoprotein(a): Secondary | ICD-10-CM | POA: Diagnosis not present

## 2020-05-16 DIAGNOSIS — D631 Anemia in chronic kidney disease: Secondary | ICD-10-CM | POA: Diagnosis not present

## 2020-05-16 DIAGNOSIS — D509 Iron deficiency anemia, unspecified: Secondary | ICD-10-CM | POA: Diagnosis not present

## 2020-05-16 DIAGNOSIS — Z992 Dependence on renal dialysis: Secondary | ICD-10-CM | POA: Diagnosis not present

## 2020-05-16 DIAGNOSIS — N2581 Secondary hyperparathyroidism of renal origin: Secondary | ICD-10-CM | POA: Diagnosis not present

## 2020-05-16 DIAGNOSIS — N186 End stage renal disease: Secondary | ICD-10-CM | POA: Diagnosis not present

## 2020-05-16 DIAGNOSIS — Z79899 Other long term (current) drug therapy: Secondary | ICD-10-CM | POA: Diagnosis not present

## 2020-05-17 ENCOUNTER — Telehealth: Payer: Self-pay | Admitting: Family Medicine

## 2020-05-17 DIAGNOSIS — D631 Anemia in chronic kidney disease: Secondary | ICD-10-CM | POA: Diagnosis not present

## 2020-05-17 DIAGNOSIS — Z79899 Other long term (current) drug therapy: Secondary | ICD-10-CM | POA: Diagnosis not present

## 2020-05-17 DIAGNOSIS — N2581 Secondary hyperparathyroidism of renal origin: Secondary | ICD-10-CM | POA: Diagnosis not present

## 2020-05-17 DIAGNOSIS — Z992 Dependence on renal dialysis: Secondary | ICD-10-CM | POA: Diagnosis not present

## 2020-05-17 DIAGNOSIS — N186 End stage renal disease: Secondary | ICD-10-CM | POA: Diagnosis not present

## 2020-05-17 DIAGNOSIS — N2589 Other disorders resulting from impaired renal tubular function: Secondary | ICD-10-CM | POA: Diagnosis not present

## 2020-05-17 DIAGNOSIS — D509 Iron deficiency anemia, unspecified: Secondary | ICD-10-CM | POA: Diagnosis not present

## 2020-05-17 DIAGNOSIS — E440 Moderate protein-calorie malnutrition: Secondary | ICD-10-CM | POA: Diagnosis not present

## 2020-05-17 DIAGNOSIS — E7841 Elevated Lipoprotein(a): Secondary | ICD-10-CM | POA: Diagnosis not present

## 2020-05-17 NOTE — Telephone Encounter (Signed)
Left message for patient to call back and schedule Medicare Annual Wellness Visit (AWV) either virtually or in office. No detailed message left   Last AWV no information  please schedule at anytime with [facility] Nurse Health Advisor 1 or 2   This should be a 45 minute visit. 

## 2020-05-18 DIAGNOSIS — Z992 Dependence on renal dialysis: Secondary | ICD-10-CM | POA: Diagnosis not present

## 2020-05-18 DIAGNOSIS — D509 Iron deficiency anemia, unspecified: Secondary | ICD-10-CM | POA: Diagnosis not present

## 2020-05-18 DIAGNOSIS — Z79899 Other long term (current) drug therapy: Secondary | ICD-10-CM | POA: Diagnosis not present

## 2020-05-18 DIAGNOSIS — N186 End stage renal disease: Secondary | ICD-10-CM | POA: Diagnosis not present

## 2020-05-18 DIAGNOSIS — E440 Moderate protein-calorie malnutrition: Secondary | ICD-10-CM | POA: Diagnosis not present

## 2020-05-18 DIAGNOSIS — D631 Anemia in chronic kidney disease: Secondary | ICD-10-CM | POA: Diagnosis not present

## 2020-05-18 DIAGNOSIS — N2589 Other disorders resulting from impaired renal tubular function: Secondary | ICD-10-CM | POA: Diagnosis not present

## 2020-05-18 DIAGNOSIS — N2581 Secondary hyperparathyroidism of renal origin: Secondary | ICD-10-CM | POA: Diagnosis not present

## 2020-05-18 DIAGNOSIS — E7841 Elevated Lipoprotein(a): Secondary | ICD-10-CM | POA: Diagnosis not present

## 2020-05-19 DIAGNOSIS — E7841 Elevated Lipoprotein(a): Secondary | ICD-10-CM | POA: Diagnosis not present

## 2020-05-19 DIAGNOSIS — N186 End stage renal disease: Secondary | ICD-10-CM | POA: Diagnosis not present

## 2020-05-19 DIAGNOSIS — Z992 Dependence on renal dialysis: Secondary | ICD-10-CM | POA: Diagnosis not present

## 2020-05-19 DIAGNOSIS — D509 Iron deficiency anemia, unspecified: Secondary | ICD-10-CM | POA: Diagnosis not present

## 2020-05-19 DIAGNOSIS — Z79899 Other long term (current) drug therapy: Secondary | ICD-10-CM | POA: Diagnosis not present

## 2020-05-19 DIAGNOSIS — E440 Moderate protein-calorie malnutrition: Secondary | ICD-10-CM | POA: Diagnosis not present

## 2020-05-19 DIAGNOSIS — N2581 Secondary hyperparathyroidism of renal origin: Secondary | ICD-10-CM | POA: Diagnosis not present

## 2020-05-19 DIAGNOSIS — D631 Anemia in chronic kidney disease: Secondary | ICD-10-CM | POA: Diagnosis not present

## 2020-05-19 DIAGNOSIS — N2589 Other disorders resulting from impaired renal tubular function: Secondary | ICD-10-CM | POA: Diagnosis not present

## 2020-05-20 DIAGNOSIS — Z79899 Other long term (current) drug therapy: Secondary | ICD-10-CM | POA: Diagnosis not present

## 2020-05-20 DIAGNOSIS — D631 Anemia in chronic kidney disease: Secondary | ICD-10-CM | POA: Diagnosis not present

## 2020-05-20 DIAGNOSIS — N2581 Secondary hyperparathyroidism of renal origin: Secondary | ICD-10-CM | POA: Diagnosis not present

## 2020-05-20 DIAGNOSIS — E440 Moderate protein-calorie malnutrition: Secondary | ICD-10-CM | POA: Diagnosis not present

## 2020-05-20 DIAGNOSIS — Z992 Dependence on renal dialysis: Secondary | ICD-10-CM | POA: Diagnosis not present

## 2020-05-20 DIAGNOSIS — N186 End stage renal disease: Secondary | ICD-10-CM | POA: Diagnosis not present

## 2020-05-20 DIAGNOSIS — D509 Iron deficiency anemia, unspecified: Secondary | ICD-10-CM | POA: Diagnosis not present

## 2020-05-20 DIAGNOSIS — N2589 Other disorders resulting from impaired renal tubular function: Secondary | ICD-10-CM | POA: Diagnosis not present

## 2020-05-20 DIAGNOSIS — E7841 Elevated Lipoprotein(a): Secondary | ICD-10-CM | POA: Diagnosis not present

## 2020-05-21 DIAGNOSIS — N186 End stage renal disease: Secondary | ICD-10-CM | POA: Diagnosis not present

## 2020-05-21 DIAGNOSIS — D509 Iron deficiency anemia, unspecified: Secondary | ICD-10-CM | POA: Diagnosis not present

## 2020-05-21 DIAGNOSIS — N2581 Secondary hyperparathyroidism of renal origin: Secondary | ICD-10-CM | POA: Diagnosis not present

## 2020-05-21 DIAGNOSIS — D631 Anemia in chronic kidney disease: Secondary | ICD-10-CM | POA: Diagnosis not present

## 2020-05-21 DIAGNOSIS — N2589 Other disorders resulting from impaired renal tubular function: Secondary | ICD-10-CM | POA: Diagnosis not present

## 2020-05-21 DIAGNOSIS — Z79899 Other long term (current) drug therapy: Secondary | ICD-10-CM | POA: Diagnosis not present

## 2020-05-21 DIAGNOSIS — E7841 Elevated Lipoprotein(a): Secondary | ICD-10-CM | POA: Diagnosis not present

## 2020-05-21 DIAGNOSIS — Z992 Dependence on renal dialysis: Secondary | ICD-10-CM | POA: Diagnosis not present

## 2020-05-21 DIAGNOSIS — E440 Moderate protein-calorie malnutrition: Secondary | ICD-10-CM | POA: Diagnosis not present

## 2020-05-22 DIAGNOSIS — D509 Iron deficiency anemia, unspecified: Secondary | ICD-10-CM | POA: Diagnosis not present

## 2020-05-22 DIAGNOSIS — N2581 Secondary hyperparathyroidism of renal origin: Secondary | ICD-10-CM | POA: Diagnosis not present

## 2020-05-22 DIAGNOSIS — E440 Moderate protein-calorie malnutrition: Secondary | ICD-10-CM | POA: Diagnosis not present

## 2020-05-22 DIAGNOSIS — Z79899 Other long term (current) drug therapy: Secondary | ICD-10-CM | POA: Diagnosis not present

## 2020-05-22 DIAGNOSIS — N186 End stage renal disease: Secondary | ICD-10-CM | POA: Diagnosis not present

## 2020-05-22 DIAGNOSIS — D631 Anemia in chronic kidney disease: Secondary | ICD-10-CM | POA: Diagnosis not present

## 2020-05-22 DIAGNOSIS — E7841 Elevated Lipoprotein(a): Secondary | ICD-10-CM | POA: Diagnosis not present

## 2020-05-22 DIAGNOSIS — Z992 Dependence on renal dialysis: Secondary | ICD-10-CM | POA: Diagnosis not present

## 2020-05-22 DIAGNOSIS — N2589 Other disorders resulting from impaired renal tubular function: Secondary | ICD-10-CM | POA: Diagnosis not present

## 2020-05-23 DIAGNOSIS — N2581 Secondary hyperparathyroidism of renal origin: Secondary | ICD-10-CM | POA: Diagnosis not present

## 2020-05-23 DIAGNOSIS — Z79899 Other long term (current) drug therapy: Secondary | ICD-10-CM | POA: Diagnosis not present

## 2020-05-23 DIAGNOSIS — E7841 Elevated Lipoprotein(a): Secondary | ICD-10-CM | POA: Diagnosis not present

## 2020-05-23 DIAGNOSIS — N2589 Other disorders resulting from impaired renal tubular function: Secondary | ICD-10-CM | POA: Diagnosis not present

## 2020-05-23 DIAGNOSIS — D509 Iron deficiency anemia, unspecified: Secondary | ICD-10-CM | POA: Diagnosis not present

## 2020-05-23 DIAGNOSIS — Z992 Dependence on renal dialysis: Secondary | ICD-10-CM | POA: Diagnosis not present

## 2020-05-23 DIAGNOSIS — N186 End stage renal disease: Secondary | ICD-10-CM | POA: Diagnosis not present

## 2020-05-23 DIAGNOSIS — E440 Moderate protein-calorie malnutrition: Secondary | ICD-10-CM | POA: Diagnosis not present

## 2020-05-23 DIAGNOSIS — D631 Anemia in chronic kidney disease: Secondary | ICD-10-CM | POA: Diagnosis not present

## 2020-05-24 DIAGNOSIS — Z992 Dependence on renal dialysis: Secondary | ICD-10-CM | POA: Diagnosis not present

## 2020-05-24 DIAGNOSIS — N2589 Other disorders resulting from impaired renal tubular function: Secondary | ICD-10-CM | POA: Diagnosis not present

## 2020-05-24 DIAGNOSIS — E440 Moderate protein-calorie malnutrition: Secondary | ICD-10-CM | POA: Diagnosis not present

## 2020-05-24 DIAGNOSIS — Z79899 Other long term (current) drug therapy: Secondary | ICD-10-CM | POA: Diagnosis not present

## 2020-05-24 DIAGNOSIS — N2581 Secondary hyperparathyroidism of renal origin: Secondary | ICD-10-CM | POA: Diagnosis not present

## 2020-05-24 DIAGNOSIS — D631 Anemia in chronic kidney disease: Secondary | ICD-10-CM | POA: Diagnosis not present

## 2020-05-24 DIAGNOSIS — E7841 Elevated Lipoprotein(a): Secondary | ICD-10-CM | POA: Diagnosis not present

## 2020-05-24 DIAGNOSIS — N186 End stage renal disease: Secondary | ICD-10-CM | POA: Diagnosis not present

## 2020-05-24 DIAGNOSIS — D509 Iron deficiency anemia, unspecified: Secondary | ICD-10-CM | POA: Diagnosis not present

## 2020-05-25 DIAGNOSIS — D631 Anemia in chronic kidney disease: Secondary | ICD-10-CM | POA: Diagnosis not present

## 2020-05-25 DIAGNOSIS — D509 Iron deficiency anemia, unspecified: Secondary | ICD-10-CM | POA: Diagnosis not present

## 2020-05-25 DIAGNOSIS — E7841 Elevated Lipoprotein(a): Secondary | ICD-10-CM | POA: Diagnosis not present

## 2020-05-25 DIAGNOSIS — N2581 Secondary hyperparathyroidism of renal origin: Secondary | ICD-10-CM | POA: Diagnosis not present

## 2020-05-25 DIAGNOSIS — N2589 Other disorders resulting from impaired renal tubular function: Secondary | ICD-10-CM | POA: Diagnosis not present

## 2020-05-25 DIAGNOSIS — Z992 Dependence on renal dialysis: Secondary | ICD-10-CM | POA: Diagnosis not present

## 2020-05-25 DIAGNOSIS — E440 Moderate protein-calorie malnutrition: Secondary | ICD-10-CM | POA: Diagnosis not present

## 2020-05-25 DIAGNOSIS — Z79899 Other long term (current) drug therapy: Secondary | ICD-10-CM | POA: Diagnosis not present

## 2020-05-25 DIAGNOSIS — N186 End stage renal disease: Secondary | ICD-10-CM | POA: Diagnosis not present

## 2020-05-26 DIAGNOSIS — N2589 Other disorders resulting from impaired renal tubular function: Secondary | ICD-10-CM | POA: Diagnosis not present

## 2020-05-26 DIAGNOSIS — Z992 Dependence on renal dialysis: Secondary | ICD-10-CM | POA: Diagnosis not present

## 2020-05-26 DIAGNOSIS — Z79899 Other long term (current) drug therapy: Secondary | ICD-10-CM | POA: Diagnosis not present

## 2020-05-26 DIAGNOSIS — E7841 Elevated Lipoprotein(a): Secondary | ICD-10-CM | POA: Diagnosis not present

## 2020-05-26 DIAGNOSIS — N2581 Secondary hyperparathyroidism of renal origin: Secondary | ICD-10-CM | POA: Diagnosis not present

## 2020-05-26 DIAGNOSIS — D631 Anemia in chronic kidney disease: Secondary | ICD-10-CM | POA: Diagnosis not present

## 2020-05-26 DIAGNOSIS — D509 Iron deficiency anemia, unspecified: Secondary | ICD-10-CM | POA: Diagnosis not present

## 2020-05-26 DIAGNOSIS — N186 End stage renal disease: Secondary | ICD-10-CM | POA: Diagnosis not present

## 2020-05-26 DIAGNOSIS — E440 Moderate protein-calorie malnutrition: Secondary | ICD-10-CM | POA: Diagnosis not present

## 2020-05-27 DIAGNOSIS — E440 Moderate protein-calorie malnutrition: Secondary | ICD-10-CM | POA: Diagnosis not present

## 2020-05-27 DIAGNOSIS — D509 Iron deficiency anemia, unspecified: Secondary | ICD-10-CM | POA: Diagnosis not present

## 2020-05-27 DIAGNOSIS — Z992 Dependence on renal dialysis: Secondary | ICD-10-CM | POA: Diagnosis not present

## 2020-05-27 DIAGNOSIS — D631 Anemia in chronic kidney disease: Secondary | ICD-10-CM | POA: Diagnosis not present

## 2020-05-27 DIAGNOSIS — Z79899 Other long term (current) drug therapy: Secondary | ICD-10-CM | POA: Diagnosis not present

## 2020-05-27 DIAGNOSIS — N2589 Other disorders resulting from impaired renal tubular function: Secondary | ICD-10-CM | POA: Diagnosis not present

## 2020-05-27 DIAGNOSIS — N2581 Secondary hyperparathyroidism of renal origin: Secondary | ICD-10-CM | POA: Diagnosis not present

## 2020-05-27 DIAGNOSIS — N186 End stage renal disease: Secondary | ICD-10-CM | POA: Diagnosis not present

## 2020-05-27 DIAGNOSIS — E7841 Elevated Lipoprotein(a): Secondary | ICD-10-CM | POA: Diagnosis not present

## 2020-05-28 DIAGNOSIS — N2581 Secondary hyperparathyroidism of renal origin: Secondary | ICD-10-CM | POA: Diagnosis not present

## 2020-05-28 DIAGNOSIS — E440 Moderate protein-calorie malnutrition: Secondary | ICD-10-CM | POA: Diagnosis not present

## 2020-05-28 DIAGNOSIS — N2589 Other disorders resulting from impaired renal tubular function: Secondary | ICD-10-CM | POA: Diagnosis not present

## 2020-05-28 DIAGNOSIS — D631 Anemia in chronic kidney disease: Secondary | ICD-10-CM | POA: Diagnosis not present

## 2020-05-28 DIAGNOSIS — N186 End stage renal disease: Secondary | ICD-10-CM | POA: Diagnosis not present

## 2020-05-28 DIAGNOSIS — D509 Iron deficiency anemia, unspecified: Secondary | ICD-10-CM | POA: Diagnosis not present

## 2020-05-28 DIAGNOSIS — Z992 Dependence on renal dialysis: Secondary | ICD-10-CM | POA: Diagnosis not present

## 2020-05-28 DIAGNOSIS — Z79899 Other long term (current) drug therapy: Secondary | ICD-10-CM | POA: Diagnosis not present

## 2020-05-28 DIAGNOSIS — E7841 Elevated Lipoprotein(a): Secondary | ICD-10-CM | POA: Diagnosis not present

## 2020-05-29 DIAGNOSIS — Z992 Dependence on renal dialysis: Secondary | ICD-10-CM | POA: Diagnosis not present

## 2020-05-29 DIAGNOSIS — N186 End stage renal disease: Secondary | ICD-10-CM | POA: Diagnosis not present

## 2020-05-29 DIAGNOSIS — D509 Iron deficiency anemia, unspecified: Secondary | ICD-10-CM | POA: Diagnosis not present

## 2020-05-29 DIAGNOSIS — N2581 Secondary hyperparathyroidism of renal origin: Secondary | ICD-10-CM | POA: Diagnosis not present

## 2020-05-29 DIAGNOSIS — E440 Moderate protein-calorie malnutrition: Secondary | ICD-10-CM | POA: Diagnosis not present

## 2020-05-29 DIAGNOSIS — D631 Anemia in chronic kidney disease: Secondary | ICD-10-CM | POA: Diagnosis not present

## 2020-05-29 DIAGNOSIS — N2589 Other disorders resulting from impaired renal tubular function: Secondary | ICD-10-CM | POA: Diagnosis not present

## 2020-05-29 DIAGNOSIS — Z79899 Other long term (current) drug therapy: Secondary | ICD-10-CM | POA: Diagnosis not present

## 2020-05-29 DIAGNOSIS — E7841 Elevated Lipoprotein(a): Secondary | ICD-10-CM | POA: Diagnosis not present

## 2020-05-30 DIAGNOSIS — Z79899 Other long term (current) drug therapy: Secondary | ICD-10-CM | POA: Diagnosis not present

## 2020-05-30 DIAGNOSIS — E7841 Elevated Lipoprotein(a): Secondary | ICD-10-CM | POA: Diagnosis not present

## 2020-05-30 DIAGNOSIS — N2589 Other disorders resulting from impaired renal tubular function: Secondary | ICD-10-CM | POA: Diagnosis not present

## 2020-05-30 DIAGNOSIS — N186 End stage renal disease: Secondary | ICD-10-CM | POA: Diagnosis not present

## 2020-05-30 DIAGNOSIS — Z992 Dependence on renal dialysis: Secondary | ICD-10-CM | POA: Diagnosis not present

## 2020-05-30 DIAGNOSIS — D509 Iron deficiency anemia, unspecified: Secondary | ICD-10-CM | POA: Diagnosis not present

## 2020-05-30 DIAGNOSIS — E440 Moderate protein-calorie malnutrition: Secondary | ICD-10-CM | POA: Diagnosis not present

## 2020-05-30 DIAGNOSIS — N2581 Secondary hyperparathyroidism of renal origin: Secondary | ICD-10-CM | POA: Diagnosis not present

## 2020-05-30 DIAGNOSIS — D631 Anemia in chronic kidney disease: Secondary | ICD-10-CM | POA: Diagnosis not present

## 2020-05-31 DIAGNOSIS — D509 Iron deficiency anemia, unspecified: Secondary | ICD-10-CM | POA: Diagnosis not present

## 2020-05-31 DIAGNOSIS — N186 End stage renal disease: Secondary | ICD-10-CM | POA: Diagnosis not present

## 2020-05-31 DIAGNOSIS — N2589 Other disorders resulting from impaired renal tubular function: Secondary | ICD-10-CM | POA: Diagnosis not present

## 2020-05-31 DIAGNOSIS — Z79899 Other long term (current) drug therapy: Secondary | ICD-10-CM | POA: Diagnosis not present

## 2020-05-31 DIAGNOSIS — E440 Moderate protein-calorie malnutrition: Secondary | ICD-10-CM | POA: Diagnosis not present

## 2020-05-31 DIAGNOSIS — Z992 Dependence on renal dialysis: Secondary | ICD-10-CM | POA: Diagnosis not present

## 2020-05-31 DIAGNOSIS — N2581 Secondary hyperparathyroidism of renal origin: Secondary | ICD-10-CM | POA: Diagnosis not present

## 2020-05-31 DIAGNOSIS — E7841 Elevated Lipoprotein(a): Secondary | ICD-10-CM | POA: Diagnosis not present

## 2020-05-31 DIAGNOSIS — D631 Anemia in chronic kidney disease: Secondary | ICD-10-CM | POA: Diagnosis not present

## 2020-06-01 DIAGNOSIS — D509 Iron deficiency anemia, unspecified: Secondary | ICD-10-CM | POA: Diagnosis not present

## 2020-06-01 DIAGNOSIS — N2581 Secondary hyperparathyroidism of renal origin: Secondary | ICD-10-CM | POA: Diagnosis not present

## 2020-06-01 DIAGNOSIS — D631 Anemia in chronic kidney disease: Secondary | ICD-10-CM | POA: Diagnosis not present

## 2020-06-01 DIAGNOSIS — E440 Moderate protein-calorie malnutrition: Secondary | ICD-10-CM | POA: Diagnosis not present

## 2020-06-01 DIAGNOSIS — E7841 Elevated Lipoprotein(a): Secondary | ICD-10-CM | POA: Diagnosis not present

## 2020-06-01 DIAGNOSIS — Z79899 Other long term (current) drug therapy: Secondary | ICD-10-CM | POA: Diagnosis not present

## 2020-06-01 DIAGNOSIS — Z992 Dependence on renal dialysis: Secondary | ICD-10-CM | POA: Diagnosis not present

## 2020-06-01 DIAGNOSIS — N2589 Other disorders resulting from impaired renal tubular function: Secondary | ICD-10-CM | POA: Diagnosis not present

## 2020-06-01 DIAGNOSIS — N186 End stage renal disease: Secondary | ICD-10-CM | POA: Diagnosis not present

## 2020-06-02 DIAGNOSIS — N2581 Secondary hyperparathyroidism of renal origin: Secondary | ICD-10-CM | POA: Diagnosis not present

## 2020-06-02 DIAGNOSIS — D631 Anemia in chronic kidney disease: Secondary | ICD-10-CM | POA: Diagnosis not present

## 2020-06-02 DIAGNOSIS — Z992 Dependence on renal dialysis: Secondary | ICD-10-CM | POA: Diagnosis not present

## 2020-06-02 DIAGNOSIS — E7841 Elevated Lipoprotein(a): Secondary | ICD-10-CM | POA: Diagnosis not present

## 2020-06-02 DIAGNOSIS — D509 Iron deficiency anemia, unspecified: Secondary | ICD-10-CM | POA: Diagnosis not present

## 2020-06-02 DIAGNOSIS — Z79899 Other long term (current) drug therapy: Secondary | ICD-10-CM | POA: Diagnosis not present

## 2020-06-02 DIAGNOSIS — E440 Moderate protein-calorie malnutrition: Secondary | ICD-10-CM | POA: Diagnosis not present

## 2020-06-02 DIAGNOSIS — N2589 Other disorders resulting from impaired renal tubular function: Secondary | ICD-10-CM | POA: Diagnosis not present

## 2020-06-02 DIAGNOSIS — N186 End stage renal disease: Secondary | ICD-10-CM | POA: Diagnosis not present

## 2020-06-03 DIAGNOSIS — Z79899 Other long term (current) drug therapy: Secondary | ICD-10-CM | POA: Diagnosis not present

## 2020-06-03 DIAGNOSIS — N186 End stage renal disease: Secondary | ICD-10-CM | POA: Diagnosis not present

## 2020-06-03 DIAGNOSIS — N2589 Other disorders resulting from impaired renal tubular function: Secondary | ICD-10-CM | POA: Diagnosis not present

## 2020-06-03 DIAGNOSIS — Z992 Dependence on renal dialysis: Secondary | ICD-10-CM | POA: Diagnosis not present

## 2020-06-03 DIAGNOSIS — E7841 Elevated Lipoprotein(a): Secondary | ICD-10-CM | POA: Diagnosis not present

## 2020-06-03 DIAGNOSIS — D631 Anemia in chronic kidney disease: Secondary | ICD-10-CM | POA: Diagnosis not present

## 2020-06-03 DIAGNOSIS — N2581 Secondary hyperparathyroidism of renal origin: Secondary | ICD-10-CM | POA: Diagnosis not present

## 2020-06-03 DIAGNOSIS — D509 Iron deficiency anemia, unspecified: Secondary | ICD-10-CM | POA: Diagnosis not present

## 2020-06-03 DIAGNOSIS — E440 Moderate protein-calorie malnutrition: Secondary | ICD-10-CM | POA: Diagnosis not present

## 2020-06-04 DIAGNOSIS — Z79899 Other long term (current) drug therapy: Secondary | ICD-10-CM | POA: Diagnosis not present

## 2020-06-04 DIAGNOSIS — I129 Hypertensive chronic kidney disease with stage 1 through stage 4 chronic kidney disease, or unspecified chronic kidney disease: Secondary | ICD-10-CM | POA: Diagnosis not present

## 2020-06-04 DIAGNOSIS — D631 Anemia in chronic kidney disease: Secondary | ICD-10-CM | POA: Diagnosis not present

## 2020-06-04 DIAGNOSIS — N2581 Secondary hyperparathyroidism of renal origin: Secondary | ICD-10-CM | POA: Diagnosis not present

## 2020-06-04 DIAGNOSIS — E440 Moderate protein-calorie malnutrition: Secondary | ICD-10-CM | POA: Diagnosis not present

## 2020-06-04 DIAGNOSIS — E7841 Elevated Lipoprotein(a): Secondary | ICD-10-CM | POA: Diagnosis not present

## 2020-06-04 DIAGNOSIS — N2589 Other disorders resulting from impaired renal tubular function: Secondary | ICD-10-CM | POA: Diagnosis not present

## 2020-06-04 DIAGNOSIS — N186 End stage renal disease: Secondary | ICD-10-CM | POA: Diagnosis not present

## 2020-06-04 DIAGNOSIS — D509 Iron deficiency anemia, unspecified: Secondary | ICD-10-CM | POA: Diagnosis not present

## 2020-06-04 DIAGNOSIS — Z992 Dependence on renal dialysis: Secondary | ICD-10-CM | POA: Diagnosis not present

## 2020-06-05 DIAGNOSIS — E440 Moderate protein-calorie malnutrition: Secondary | ICD-10-CM | POA: Diagnosis not present

## 2020-06-05 DIAGNOSIS — N2581 Secondary hyperparathyroidism of renal origin: Secondary | ICD-10-CM | POA: Diagnosis not present

## 2020-06-05 DIAGNOSIS — N186 End stage renal disease: Secondary | ICD-10-CM | POA: Diagnosis not present

## 2020-06-05 DIAGNOSIS — N2589 Other disorders resulting from impaired renal tubular function: Secondary | ICD-10-CM | POA: Diagnosis not present

## 2020-06-05 DIAGNOSIS — E7841 Elevated Lipoprotein(a): Secondary | ICD-10-CM | POA: Diagnosis not present

## 2020-06-05 DIAGNOSIS — Z992 Dependence on renal dialysis: Secondary | ICD-10-CM | POA: Diagnosis not present

## 2020-06-05 DIAGNOSIS — Z79899 Other long term (current) drug therapy: Secondary | ICD-10-CM | POA: Diagnosis not present

## 2020-06-05 DIAGNOSIS — D509 Iron deficiency anemia, unspecified: Secondary | ICD-10-CM | POA: Diagnosis not present

## 2020-06-05 DIAGNOSIS — D631 Anemia in chronic kidney disease: Secondary | ICD-10-CM | POA: Diagnosis not present

## 2020-06-06 DIAGNOSIS — D509 Iron deficiency anemia, unspecified: Secondary | ICD-10-CM | POA: Diagnosis not present

## 2020-06-06 DIAGNOSIS — E440 Moderate protein-calorie malnutrition: Secondary | ICD-10-CM | POA: Diagnosis not present

## 2020-06-06 DIAGNOSIS — N2589 Other disorders resulting from impaired renal tubular function: Secondary | ICD-10-CM | POA: Diagnosis not present

## 2020-06-06 DIAGNOSIS — Z79899 Other long term (current) drug therapy: Secondary | ICD-10-CM | POA: Diagnosis not present

## 2020-06-06 DIAGNOSIS — E7841 Elevated Lipoprotein(a): Secondary | ICD-10-CM | POA: Diagnosis not present

## 2020-06-06 DIAGNOSIS — Z992 Dependence on renal dialysis: Secondary | ICD-10-CM | POA: Diagnosis not present

## 2020-06-06 DIAGNOSIS — D631 Anemia in chronic kidney disease: Secondary | ICD-10-CM | POA: Diagnosis not present

## 2020-06-06 DIAGNOSIS — N186 End stage renal disease: Secondary | ICD-10-CM | POA: Diagnosis not present

## 2020-06-06 DIAGNOSIS — N2581 Secondary hyperparathyroidism of renal origin: Secondary | ICD-10-CM | POA: Diagnosis not present

## 2020-06-07 DIAGNOSIS — N2581 Secondary hyperparathyroidism of renal origin: Secondary | ICD-10-CM | POA: Diagnosis not present

## 2020-06-07 DIAGNOSIS — Z79899 Other long term (current) drug therapy: Secondary | ICD-10-CM | POA: Diagnosis not present

## 2020-06-07 DIAGNOSIS — E7841 Elevated Lipoprotein(a): Secondary | ICD-10-CM | POA: Diagnosis not present

## 2020-06-07 DIAGNOSIS — N2589 Other disorders resulting from impaired renal tubular function: Secondary | ICD-10-CM | POA: Diagnosis not present

## 2020-06-07 DIAGNOSIS — E440 Moderate protein-calorie malnutrition: Secondary | ICD-10-CM | POA: Diagnosis not present

## 2020-06-07 DIAGNOSIS — Z992 Dependence on renal dialysis: Secondary | ICD-10-CM | POA: Diagnosis not present

## 2020-06-07 DIAGNOSIS — D509 Iron deficiency anemia, unspecified: Secondary | ICD-10-CM | POA: Diagnosis not present

## 2020-06-07 DIAGNOSIS — N186 End stage renal disease: Secondary | ICD-10-CM | POA: Diagnosis not present

## 2020-06-07 DIAGNOSIS — D631 Anemia in chronic kidney disease: Secondary | ICD-10-CM | POA: Diagnosis not present

## 2020-06-08 DIAGNOSIS — D631 Anemia in chronic kidney disease: Secondary | ICD-10-CM | POA: Diagnosis not present

## 2020-06-08 DIAGNOSIS — N186 End stage renal disease: Secondary | ICD-10-CM | POA: Diagnosis not present

## 2020-06-08 DIAGNOSIS — N2589 Other disorders resulting from impaired renal tubular function: Secondary | ICD-10-CM | POA: Diagnosis not present

## 2020-06-08 DIAGNOSIS — N2581 Secondary hyperparathyroidism of renal origin: Secondary | ICD-10-CM | POA: Diagnosis not present

## 2020-06-08 DIAGNOSIS — Z79899 Other long term (current) drug therapy: Secondary | ICD-10-CM | POA: Diagnosis not present

## 2020-06-08 DIAGNOSIS — E7841 Elevated Lipoprotein(a): Secondary | ICD-10-CM | POA: Diagnosis not present

## 2020-06-08 DIAGNOSIS — E440 Moderate protein-calorie malnutrition: Secondary | ICD-10-CM | POA: Diagnosis not present

## 2020-06-08 DIAGNOSIS — Z992 Dependence on renal dialysis: Secondary | ICD-10-CM | POA: Diagnosis not present

## 2020-06-08 DIAGNOSIS — D509 Iron deficiency anemia, unspecified: Secondary | ICD-10-CM | POA: Diagnosis not present

## 2020-06-09 DIAGNOSIS — N186 End stage renal disease: Secondary | ICD-10-CM | POA: Diagnosis not present

## 2020-06-09 DIAGNOSIS — D509 Iron deficiency anemia, unspecified: Secondary | ICD-10-CM | POA: Diagnosis not present

## 2020-06-09 DIAGNOSIS — E440 Moderate protein-calorie malnutrition: Secondary | ICD-10-CM | POA: Diagnosis not present

## 2020-06-09 DIAGNOSIS — E7841 Elevated Lipoprotein(a): Secondary | ICD-10-CM | POA: Diagnosis not present

## 2020-06-09 DIAGNOSIS — D631 Anemia in chronic kidney disease: Secondary | ICD-10-CM | POA: Diagnosis not present

## 2020-06-09 DIAGNOSIS — N2581 Secondary hyperparathyroidism of renal origin: Secondary | ICD-10-CM | POA: Diagnosis not present

## 2020-06-09 DIAGNOSIS — Z79899 Other long term (current) drug therapy: Secondary | ICD-10-CM | POA: Diagnosis not present

## 2020-06-09 DIAGNOSIS — Z992 Dependence on renal dialysis: Secondary | ICD-10-CM | POA: Diagnosis not present

## 2020-06-09 DIAGNOSIS — N2589 Other disorders resulting from impaired renal tubular function: Secondary | ICD-10-CM | POA: Diagnosis not present

## 2020-06-10 DIAGNOSIS — N2581 Secondary hyperparathyroidism of renal origin: Secondary | ICD-10-CM | POA: Diagnosis not present

## 2020-06-10 DIAGNOSIS — E440 Moderate protein-calorie malnutrition: Secondary | ICD-10-CM | POA: Diagnosis not present

## 2020-06-10 DIAGNOSIS — E7841 Elevated Lipoprotein(a): Secondary | ICD-10-CM | POA: Diagnosis not present

## 2020-06-10 DIAGNOSIS — Z992 Dependence on renal dialysis: Secondary | ICD-10-CM | POA: Diagnosis not present

## 2020-06-10 DIAGNOSIS — N186 End stage renal disease: Secondary | ICD-10-CM | POA: Diagnosis not present

## 2020-06-10 DIAGNOSIS — Z79899 Other long term (current) drug therapy: Secondary | ICD-10-CM | POA: Diagnosis not present

## 2020-06-10 DIAGNOSIS — N2589 Other disorders resulting from impaired renal tubular function: Secondary | ICD-10-CM | POA: Diagnosis not present

## 2020-06-10 DIAGNOSIS — D509 Iron deficiency anemia, unspecified: Secondary | ICD-10-CM | POA: Diagnosis not present

## 2020-06-10 DIAGNOSIS — D631 Anemia in chronic kidney disease: Secondary | ICD-10-CM | POA: Diagnosis not present

## 2020-06-11 DIAGNOSIS — D509 Iron deficiency anemia, unspecified: Secondary | ICD-10-CM | POA: Diagnosis not present

## 2020-06-11 DIAGNOSIS — E440 Moderate protein-calorie malnutrition: Secondary | ICD-10-CM | POA: Diagnosis not present

## 2020-06-11 DIAGNOSIS — E7841 Elevated Lipoprotein(a): Secondary | ICD-10-CM | POA: Diagnosis not present

## 2020-06-11 DIAGNOSIS — D631 Anemia in chronic kidney disease: Secondary | ICD-10-CM | POA: Diagnosis not present

## 2020-06-11 DIAGNOSIS — Z992 Dependence on renal dialysis: Secondary | ICD-10-CM | POA: Diagnosis not present

## 2020-06-11 DIAGNOSIS — N2589 Other disorders resulting from impaired renal tubular function: Secondary | ICD-10-CM | POA: Diagnosis not present

## 2020-06-11 DIAGNOSIS — Z79899 Other long term (current) drug therapy: Secondary | ICD-10-CM | POA: Diagnosis not present

## 2020-06-11 DIAGNOSIS — N2581 Secondary hyperparathyroidism of renal origin: Secondary | ICD-10-CM | POA: Diagnosis not present

## 2020-06-11 DIAGNOSIS — N186 End stage renal disease: Secondary | ICD-10-CM | POA: Diagnosis not present

## 2020-06-12 DIAGNOSIS — N2589 Other disorders resulting from impaired renal tubular function: Secondary | ICD-10-CM | POA: Diagnosis not present

## 2020-06-12 DIAGNOSIS — Z79899 Other long term (current) drug therapy: Secondary | ICD-10-CM | POA: Diagnosis not present

## 2020-06-12 DIAGNOSIS — Z992 Dependence on renal dialysis: Secondary | ICD-10-CM | POA: Diagnosis not present

## 2020-06-12 DIAGNOSIS — E7841 Elevated Lipoprotein(a): Secondary | ICD-10-CM | POA: Diagnosis not present

## 2020-06-12 DIAGNOSIS — E440 Moderate protein-calorie malnutrition: Secondary | ICD-10-CM | POA: Diagnosis not present

## 2020-06-12 DIAGNOSIS — N186 End stage renal disease: Secondary | ICD-10-CM | POA: Diagnosis not present

## 2020-06-12 DIAGNOSIS — D631 Anemia in chronic kidney disease: Secondary | ICD-10-CM | POA: Diagnosis not present

## 2020-06-12 DIAGNOSIS — D509 Iron deficiency anemia, unspecified: Secondary | ICD-10-CM | POA: Diagnosis not present

## 2020-06-12 DIAGNOSIS — N2581 Secondary hyperparathyroidism of renal origin: Secondary | ICD-10-CM | POA: Diagnosis not present

## 2020-06-13 DIAGNOSIS — Z79899 Other long term (current) drug therapy: Secondary | ICD-10-CM | POA: Diagnosis not present

## 2020-06-13 DIAGNOSIS — N2581 Secondary hyperparathyroidism of renal origin: Secondary | ICD-10-CM | POA: Diagnosis not present

## 2020-06-13 DIAGNOSIS — E7841 Elevated Lipoprotein(a): Secondary | ICD-10-CM | POA: Diagnosis not present

## 2020-06-13 DIAGNOSIS — D631 Anemia in chronic kidney disease: Secondary | ICD-10-CM | POA: Diagnosis not present

## 2020-06-13 DIAGNOSIS — N186 End stage renal disease: Secondary | ICD-10-CM | POA: Diagnosis not present

## 2020-06-13 DIAGNOSIS — D509 Iron deficiency anemia, unspecified: Secondary | ICD-10-CM | POA: Diagnosis not present

## 2020-06-13 DIAGNOSIS — N2589 Other disorders resulting from impaired renal tubular function: Secondary | ICD-10-CM | POA: Diagnosis not present

## 2020-06-13 DIAGNOSIS — E440 Moderate protein-calorie malnutrition: Secondary | ICD-10-CM | POA: Diagnosis not present

## 2020-06-13 DIAGNOSIS — Z992 Dependence on renal dialysis: Secondary | ICD-10-CM | POA: Diagnosis not present

## 2020-06-14 DIAGNOSIS — E7841 Elevated Lipoprotein(a): Secondary | ICD-10-CM | POA: Diagnosis not present

## 2020-06-14 DIAGNOSIS — E440 Moderate protein-calorie malnutrition: Secondary | ICD-10-CM | POA: Diagnosis not present

## 2020-06-14 DIAGNOSIS — N2589 Other disorders resulting from impaired renal tubular function: Secondary | ICD-10-CM | POA: Diagnosis not present

## 2020-06-14 DIAGNOSIS — N186 End stage renal disease: Secondary | ICD-10-CM | POA: Diagnosis not present

## 2020-06-14 DIAGNOSIS — Z79899 Other long term (current) drug therapy: Secondary | ICD-10-CM | POA: Diagnosis not present

## 2020-06-14 DIAGNOSIS — Z992 Dependence on renal dialysis: Secondary | ICD-10-CM | POA: Diagnosis not present

## 2020-06-14 DIAGNOSIS — D631 Anemia in chronic kidney disease: Secondary | ICD-10-CM | POA: Diagnosis not present

## 2020-06-14 DIAGNOSIS — D509 Iron deficiency anemia, unspecified: Secondary | ICD-10-CM | POA: Diagnosis not present

## 2020-06-14 DIAGNOSIS — N2581 Secondary hyperparathyroidism of renal origin: Secondary | ICD-10-CM | POA: Diagnosis not present

## 2020-06-15 DIAGNOSIS — Z992 Dependence on renal dialysis: Secondary | ICD-10-CM | POA: Diagnosis not present

## 2020-06-15 DIAGNOSIS — E7841 Elevated Lipoprotein(a): Secondary | ICD-10-CM | POA: Diagnosis not present

## 2020-06-15 DIAGNOSIS — D509 Iron deficiency anemia, unspecified: Secondary | ICD-10-CM | POA: Diagnosis not present

## 2020-06-15 DIAGNOSIS — N2589 Other disorders resulting from impaired renal tubular function: Secondary | ICD-10-CM | POA: Diagnosis not present

## 2020-06-15 DIAGNOSIS — N186 End stage renal disease: Secondary | ICD-10-CM | POA: Diagnosis not present

## 2020-06-15 DIAGNOSIS — Z79899 Other long term (current) drug therapy: Secondary | ICD-10-CM | POA: Diagnosis not present

## 2020-06-15 DIAGNOSIS — E440 Moderate protein-calorie malnutrition: Secondary | ICD-10-CM | POA: Diagnosis not present

## 2020-06-15 DIAGNOSIS — D631 Anemia in chronic kidney disease: Secondary | ICD-10-CM | POA: Diagnosis not present

## 2020-06-15 DIAGNOSIS — N2581 Secondary hyperparathyroidism of renal origin: Secondary | ICD-10-CM | POA: Diagnosis not present

## 2020-06-16 DIAGNOSIS — Z79899 Other long term (current) drug therapy: Secondary | ICD-10-CM | POA: Diagnosis not present

## 2020-06-16 DIAGNOSIS — D631 Anemia in chronic kidney disease: Secondary | ICD-10-CM | POA: Diagnosis not present

## 2020-06-16 DIAGNOSIS — E7841 Elevated Lipoprotein(a): Secondary | ICD-10-CM | POA: Diagnosis not present

## 2020-06-16 DIAGNOSIS — N186 End stage renal disease: Secondary | ICD-10-CM | POA: Diagnosis not present

## 2020-06-16 DIAGNOSIS — N2589 Other disorders resulting from impaired renal tubular function: Secondary | ICD-10-CM | POA: Diagnosis not present

## 2020-06-16 DIAGNOSIS — N2581 Secondary hyperparathyroidism of renal origin: Secondary | ICD-10-CM | POA: Diagnosis not present

## 2020-06-16 DIAGNOSIS — E440 Moderate protein-calorie malnutrition: Secondary | ICD-10-CM | POA: Diagnosis not present

## 2020-06-16 DIAGNOSIS — Z992 Dependence on renal dialysis: Secondary | ICD-10-CM | POA: Diagnosis not present

## 2020-06-16 DIAGNOSIS — D509 Iron deficiency anemia, unspecified: Secondary | ICD-10-CM | POA: Diagnosis not present

## 2020-06-17 DIAGNOSIS — N2581 Secondary hyperparathyroidism of renal origin: Secondary | ICD-10-CM | POA: Diagnosis not present

## 2020-06-17 DIAGNOSIS — E440 Moderate protein-calorie malnutrition: Secondary | ICD-10-CM | POA: Diagnosis not present

## 2020-06-17 DIAGNOSIS — D509 Iron deficiency anemia, unspecified: Secondary | ICD-10-CM | POA: Diagnosis not present

## 2020-06-17 DIAGNOSIS — N186 End stage renal disease: Secondary | ICD-10-CM | POA: Diagnosis not present

## 2020-06-17 DIAGNOSIS — Z79899 Other long term (current) drug therapy: Secondary | ICD-10-CM | POA: Diagnosis not present

## 2020-06-17 DIAGNOSIS — E7841 Elevated Lipoprotein(a): Secondary | ICD-10-CM | POA: Diagnosis not present

## 2020-06-17 DIAGNOSIS — D631 Anemia in chronic kidney disease: Secondary | ICD-10-CM | POA: Diagnosis not present

## 2020-06-17 DIAGNOSIS — N2589 Other disorders resulting from impaired renal tubular function: Secondary | ICD-10-CM | POA: Diagnosis not present

## 2020-06-17 DIAGNOSIS — Z992 Dependence on renal dialysis: Secondary | ICD-10-CM | POA: Diagnosis not present

## 2020-06-18 DIAGNOSIS — E7841 Elevated Lipoprotein(a): Secondary | ICD-10-CM | POA: Diagnosis not present

## 2020-06-18 DIAGNOSIS — E440 Moderate protein-calorie malnutrition: Secondary | ICD-10-CM | POA: Diagnosis not present

## 2020-06-18 DIAGNOSIS — N2589 Other disorders resulting from impaired renal tubular function: Secondary | ICD-10-CM | POA: Diagnosis not present

## 2020-06-18 DIAGNOSIS — N186 End stage renal disease: Secondary | ICD-10-CM | POA: Diagnosis not present

## 2020-06-18 DIAGNOSIS — Z992 Dependence on renal dialysis: Secondary | ICD-10-CM | POA: Diagnosis not present

## 2020-06-18 DIAGNOSIS — D509 Iron deficiency anemia, unspecified: Secondary | ICD-10-CM | POA: Diagnosis not present

## 2020-06-18 DIAGNOSIS — Z79899 Other long term (current) drug therapy: Secondary | ICD-10-CM | POA: Diagnosis not present

## 2020-06-18 DIAGNOSIS — D631 Anemia in chronic kidney disease: Secondary | ICD-10-CM | POA: Diagnosis not present

## 2020-06-18 DIAGNOSIS — N2581 Secondary hyperparathyroidism of renal origin: Secondary | ICD-10-CM | POA: Diagnosis not present

## 2020-06-19 DIAGNOSIS — N2589 Other disorders resulting from impaired renal tubular function: Secondary | ICD-10-CM | POA: Diagnosis not present

## 2020-06-19 DIAGNOSIS — D509 Iron deficiency anemia, unspecified: Secondary | ICD-10-CM | POA: Diagnosis not present

## 2020-06-19 DIAGNOSIS — N186 End stage renal disease: Secondary | ICD-10-CM | POA: Diagnosis not present

## 2020-06-19 DIAGNOSIS — E440 Moderate protein-calorie malnutrition: Secondary | ICD-10-CM | POA: Diagnosis not present

## 2020-06-19 DIAGNOSIS — D631 Anemia in chronic kidney disease: Secondary | ICD-10-CM | POA: Diagnosis not present

## 2020-06-19 DIAGNOSIS — Z79899 Other long term (current) drug therapy: Secondary | ICD-10-CM | POA: Diagnosis not present

## 2020-06-19 DIAGNOSIS — N2581 Secondary hyperparathyroidism of renal origin: Secondary | ICD-10-CM | POA: Diagnosis not present

## 2020-06-19 DIAGNOSIS — Z992 Dependence on renal dialysis: Secondary | ICD-10-CM | POA: Diagnosis not present

## 2020-06-19 DIAGNOSIS — E7841 Elevated Lipoprotein(a): Secondary | ICD-10-CM | POA: Diagnosis not present

## 2020-06-20 DIAGNOSIS — D509 Iron deficiency anemia, unspecified: Secondary | ICD-10-CM | POA: Diagnosis not present

## 2020-06-20 DIAGNOSIS — N2581 Secondary hyperparathyroidism of renal origin: Secondary | ICD-10-CM | POA: Diagnosis not present

## 2020-06-20 DIAGNOSIS — Z992 Dependence on renal dialysis: Secondary | ICD-10-CM | POA: Diagnosis not present

## 2020-06-20 DIAGNOSIS — E440 Moderate protein-calorie malnutrition: Secondary | ICD-10-CM | POA: Diagnosis not present

## 2020-06-20 DIAGNOSIS — Z79899 Other long term (current) drug therapy: Secondary | ICD-10-CM | POA: Diagnosis not present

## 2020-06-20 DIAGNOSIS — E7841 Elevated Lipoprotein(a): Secondary | ICD-10-CM | POA: Diagnosis not present

## 2020-06-20 DIAGNOSIS — D631 Anemia in chronic kidney disease: Secondary | ICD-10-CM | POA: Diagnosis not present

## 2020-06-20 DIAGNOSIS — N186 End stage renal disease: Secondary | ICD-10-CM | POA: Diagnosis not present

## 2020-06-20 DIAGNOSIS — N2589 Other disorders resulting from impaired renal tubular function: Secondary | ICD-10-CM | POA: Diagnosis not present

## 2020-06-21 DIAGNOSIS — Z79899 Other long term (current) drug therapy: Secondary | ICD-10-CM | POA: Diagnosis not present

## 2020-06-21 DIAGNOSIS — Z992 Dependence on renal dialysis: Secondary | ICD-10-CM | POA: Diagnosis not present

## 2020-06-21 DIAGNOSIS — E7841 Elevated Lipoprotein(a): Secondary | ICD-10-CM | POA: Diagnosis not present

## 2020-06-21 DIAGNOSIS — D509 Iron deficiency anemia, unspecified: Secondary | ICD-10-CM | POA: Diagnosis not present

## 2020-06-21 DIAGNOSIS — D631 Anemia in chronic kidney disease: Secondary | ICD-10-CM | POA: Diagnosis not present

## 2020-06-21 DIAGNOSIS — N2589 Other disorders resulting from impaired renal tubular function: Secondary | ICD-10-CM | POA: Diagnosis not present

## 2020-06-21 DIAGNOSIS — N186 End stage renal disease: Secondary | ICD-10-CM | POA: Diagnosis not present

## 2020-06-21 DIAGNOSIS — E440 Moderate protein-calorie malnutrition: Secondary | ICD-10-CM | POA: Diagnosis not present

## 2020-06-21 DIAGNOSIS — N2581 Secondary hyperparathyroidism of renal origin: Secondary | ICD-10-CM | POA: Diagnosis not present

## 2020-06-22 DIAGNOSIS — E7841 Elevated Lipoprotein(a): Secondary | ICD-10-CM | POA: Diagnosis not present

## 2020-06-22 DIAGNOSIS — Z992 Dependence on renal dialysis: Secondary | ICD-10-CM | POA: Diagnosis not present

## 2020-06-22 DIAGNOSIS — D631 Anemia in chronic kidney disease: Secondary | ICD-10-CM | POA: Diagnosis not present

## 2020-06-22 DIAGNOSIS — N186 End stage renal disease: Secondary | ICD-10-CM | POA: Diagnosis not present

## 2020-06-22 DIAGNOSIS — N2581 Secondary hyperparathyroidism of renal origin: Secondary | ICD-10-CM | POA: Diagnosis not present

## 2020-06-22 DIAGNOSIS — E440 Moderate protein-calorie malnutrition: Secondary | ICD-10-CM | POA: Diagnosis not present

## 2020-06-22 DIAGNOSIS — D509 Iron deficiency anemia, unspecified: Secondary | ICD-10-CM | POA: Diagnosis not present

## 2020-06-22 DIAGNOSIS — Z79899 Other long term (current) drug therapy: Secondary | ICD-10-CM | POA: Diagnosis not present

## 2020-06-22 DIAGNOSIS — N2589 Other disorders resulting from impaired renal tubular function: Secondary | ICD-10-CM | POA: Diagnosis not present

## 2020-06-23 DIAGNOSIS — E440 Moderate protein-calorie malnutrition: Secondary | ICD-10-CM | POA: Diagnosis not present

## 2020-06-23 DIAGNOSIS — D631 Anemia in chronic kidney disease: Secondary | ICD-10-CM | POA: Diagnosis not present

## 2020-06-23 DIAGNOSIS — E7841 Elevated Lipoprotein(a): Secondary | ICD-10-CM | POA: Diagnosis not present

## 2020-06-23 DIAGNOSIS — N186 End stage renal disease: Secondary | ICD-10-CM | POA: Diagnosis not present

## 2020-06-23 DIAGNOSIS — N2581 Secondary hyperparathyroidism of renal origin: Secondary | ICD-10-CM | POA: Diagnosis not present

## 2020-06-23 DIAGNOSIS — Z79899 Other long term (current) drug therapy: Secondary | ICD-10-CM | POA: Diagnosis not present

## 2020-06-23 DIAGNOSIS — Z992 Dependence on renal dialysis: Secondary | ICD-10-CM | POA: Diagnosis not present

## 2020-06-23 DIAGNOSIS — N2589 Other disorders resulting from impaired renal tubular function: Secondary | ICD-10-CM | POA: Diagnosis not present

## 2020-06-23 DIAGNOSIS — D509 Iron deficiency anemia, unspecified: Secondary | ICD-10-CM | POA: Diagnosis not present

## 2020-06-24 DIAGNOSIS — E440 Moderate protein-calorie malnutrition: Secondary | ICD-10-CM | POA: Diagnosis not present

## 2020-06-24 DIAGNOSIS — Z992 Dependence on renal dialysis: Secondary | ICD-10-CM | POA: Diagnosis not present

## 2020-06-24 DIAGNOSIS — E7841 Elevated Lipoprotein(a): Secondary | ICD-10-CM | POA: Diagnosis not present

## 2020-06-24 DIAGNOSIS — D631 Anemia in chronic kidney disease: Secondary | ICD-10-CM | POA: Diagnosis not present

## 2020-06-24 DIAGNOSIS — D509 Iron deficiency anemia, unspecified: Secondary | ICD-10-CM | POA: Diagnosis not present

## 2020-06-24 DIAGNOSIS — Z79899 Other long term (current) drug therapy: Secondary | ICD-10-CM | POA: Diagnosis not present

## 2020-06-24 DIAGNOSIS — N2581 Secondary hyperparathyroidism of renal origin: Secondary | ICD-10-CM | POA: Diagnosis not present

## 2020-06-24 DIAGNOSIS — N186 End stage renal disease: Secondary | ICD-10-CM | POA: Diagnosis not present

## 2020-06-24 DIAGNOSIS — N2589 Other disorders resulting from impaired renal tubular function: Secondary | ICD-10-CM | POA: Diagnosis not present

## 2020-06-25 DIAGNOSIS — D509 Iron deficiency anemia, unspecified: Secondary | ICD-10-CM | POA: Diagnosis not present

## 2020-06-25 DIAGNOSIS — Z79899 Other long term (current) drug therapy: Secondary | ICD-10-CM | POA: Diagnosis not present

## 2020-06-25 DIAGNOSIS — E7841 Elevated Lipoprotein(a): Secondary | ICD-10-CM | POA: Diagnosis not present

## 2020-06-25 DIAGNOSIS — E440 Moderate protein-calorie malnutrition: Secondary | ICD-10-CM | POA: Diagnosis not present

## 2020-06-25 DIAGNOSIS — N2581 Secondary hyperparathyroidism of renal origin: Secondary | ICD-10-CM | POA: Diagnosis not present

## 2020-06-25 DIAGNOSIS — N186 End stage renal disease: Secondary | ICD-10-CM | POA: Diagnosis not present

## 2020-06-25 DIAGNOSIS — D631 Anemia in chronic kidney disease: Secondary | ICD-10-CM | POA: Diagnosis not present

## 2020-06-25 DIAGNOSIS — Z992 Dependence on renal dialysis: Secondary | ICD-10-CM | POA: Diagnosis not present

## 2020-06-25 DIAGNOSIS — N2589 Other disorders resulting from impaired renal tubular function: Secondary | ICD-10-CM | POA: Diagnosis not present

## 2020-06-26 DIAGNOSIS — N2589 Other disorders resulting from impaired renal tubular function: Secondary | ICD-10-CM | POA: Diagnosis not present

## 2020-06-26 DIAGNOSIS — E440 Moderate protein-calorie malnutrition: Secondary | ICD-10-CM | POA: Diagnosis not present

## 2020-06-26 DIAGNOSIS — E7841 Elevated Lipoprotein(a): Secondary | ICD-10-CM | POA: Diagnosis not present

## 2020-06-26 DIAGNOSIS — N186 End stage renal disease: Secondary | ICD-10-CM | POA: Diagnosis not present

## 2020-06-26 DIAGNOSIS — N2581 Secondary hyperparathyroidism of renal origin: Secondary | ICD-10-CM | POA: Diagnosis not present

## 2020-06-26 DIAGNOSIS — D509 Iron deficiency anemia, unspecified: Secondary | ICD-10-CM | POA: Diagnosis not present

## 2020-06-26 DIAGNOSIS — D631 Anemia in chronic kidney disease: Secondary | ICD-10-CM | POA: Diagnosis not present

## 2020-06-26 DIAGNOSIS — Z79899 Other long term (current) drug therapy: Secondary | ICD-10-CM | POA: Diagnosis not present

## 2020-06-26 DIAGNOSIS — Z992 Dependence on renal dialysis: Secondary | ICD-10-CM | POA: Diagnosis not present

## 2020-06-27 DIAGNOSIS — N186 End stage renal disease: Secondary | ICD-10-CM | POA: Diagnosis not present

## 2020-06-27 DIAGNOSIS — D509 Iron deficiency anemia, unspecified: Secondary | ICD-10-CM | POA: Diagnosis not present

## 2020-06-27 DIAGNOSIS — Z992 Dependence on renal dialysis: Secondary | ICD-10-CM | POA: Diagnosis not present

## 2020-06-27 DIAGNOSIS — D631 Anemia in chronic kidney disease: Secondary | ICD-10-CM | POA: Diagnosis not present

## 2020-06-27 DIAGNOSIS — N2581 Secondary hyperparathyroidism of renal origin: Secondary | ICD-10-CM | POA: Diagnosis not present

## 2020-06-27 DIAGNOSIS — E440 Moderate protein-calorie malnutrition: Secondary | ICD-10-CM | POA: Diagnosis not present

## 2020-06-27 DIAGNOSIS — E7841 Elevated Lipoprotein(a): Secondary | ICD-10-CM | POA: Diagnosis not present

## 2020-06-27 DIAGNOSIS — N2589 Other disorders resulting from impaired renal tubular function: Secondary | ICD-10-CM | POA: Diagnosis not present

## 2020-06-27 DIAGNOSIS — Z79899 Other long term (current) drug therapy: Secondary | ICD-10-CM | POA: Diagnosis not present

## 2020-06-28 DIAGNOSIS — E7841 Elevated Lipoprotein(a): Secondary | ICD-10-CM | POA: Diagnosis not present

## 2020-06-28 DIAGNOSIS — N2589 Other disorders resulting from impaired renal tubular function: Secondary | ICD-10-CM | POA: Diagnosis not present

## 2020-06-28 DIAGNOSIS — D509 Iron deficiency anemia, unspecified: Secondary | ICD-10-CM | POA: Diagnosis not present

## 2020-06-28 DIAGNOSIS — N186 End stage renal disease: Secondary | ICD-10-CM | POA: Diagnosis not present

## 2020-06-28 DIAGNOSIS — Z79899 Other long term (current) drug therapy: Secondary | ICD-10-CM | POA: Diagnosis not present

## 2020-06-28 DIAGNOSIS — D631 Anemia in chronic kidney disease: Secondary | ICD-10-CM | POA: Diagnosis not present

## 2020-06-28 DIAGNOSIS — E440 Moderate protein-calorie malnutrition: Secondary | ICD-10-CM | POA: Diagnosis not present

## 2020-06-28 DIAGNOSIS — N2581 Secondary hyperparathyroidism of renal origin: Secondary | ICD-10-CM | POA: Diagnosis not present

## 2020-06-28 DIAGNOSIS — Z992 Dependence on renal dialysis: Secondary | ICD-10-CM | POA: Diagnosis not present

## 2020-06-29 DIAGNOSIS — N2589 Other disorders resulting from impaired renal tubular function: Secondary | ICD-10-CM | POA: Diagnosis not present

## 2020-06-29 DIAGNOSIS — E7841 Elevated Lipoprotein(a): Secondary | ICD-10-CM | POA: Diagnosis not present

## 2020-06-29 DIAGNOSIS — N186 End stage renal disease: Secondary | ICD-10-CM | POA: Diagnosis not present

## 2020-06-29 DIAGNOSIS — D509 Iron deficiency anemia, unspecified: Secondary | ICD-10-CM | POA: Diagnosis not present

## 2020-06-29 DIAGNOSIS — E440 Moderate protein-calorie malnutrition: Secondary | ICD-10-CM | POA: Diagnosis not present

## 2020-06-29 DIAGNOSIS — D631 Anemia in chronic kidney disease: Secondary | ICD-10-CM | POA: Diagnosis not present

## 2020-06-29 DIAGNOSIS — Z79899 Other long term (current) drug therapy: Secondary | ICD-10-CM | POA: Diagnosis not present

## 2020-06-29 DIAGNOSIS — Z992 Dependence on renal dialysis: Secondary | ICD-10-CM | POA: Diagnosis not present

## 2020-06-29 DIAGNOSIS — N2581 Secondary hyperparathyroidism of renal origin: Secondary | ICD-10-CM | POA: Diagnosis not present

## 2020-06-30 DIAGNOSIS — Z79899 Other long term (current) drug therapy: Secondary | ICD-10-CM | POA: Diagnosis not present

## 2020-06-30 DIAGNOSIS — Z992 Dependence on renal dialysis: Secondary | ICD-10-CM | POA: Diagnosis not present

## 2020-06-30 DIAGNOSIS — N2581 Secondary hyperparathyroidism of renal origin: Secondary | ICD-10-CM | POA: Diagnosis not present

## 2020-06-30 DIAGNOSIS — N186 End stage renal disease: Secondary | ICD-10-CM | POA: Diagnosis not present

## 2020-06-30 DIAGNOSIS — D509 Iron deficiency anemia, unspecified: Secondary | ICD-10-CM | POA: Diagnosis not present

## 2020-06-30 DIAGNOSIS — N2589 Other disorders resulting from impaired renal tubular function: Secondary | ICD-10-CM | POA: Diagnosis not present

## 2020-06-30 DIAGNOSIS — D631 Anemia in chronic kidney disease: Secondary | ICD-10-CM | POA: Diagnosis not present

## 2020-06-30 DIAGNOSIS — E7841 Elevated Lipoprotein(a): Secondary | ICD-10-CM | POA: Diagnosis not present

## 2020-06-30 DIAGNOSIS — E440 Moderate protein-calorie malnutrition: Secondary | ICD-10-CM | POA: Diagnosis not present

## 2020-07-01 DIAGNOSIS — Z992 Dependence on renal dialysis: Secondary | ICD-10-CM | POA: Diagnosis not present

## 2020-07-01 DIAGNOSIS — Z79899 Other long term (current) drug therapy: Secondary | ICD-10-CM | POA: Diagnosis not present

## 2020-07-01 DIAGNOSIS — D509 Iron deficiency anemia, unspecified: Secondary | ICD-10-CM | POA: Diagnosis not present

## 2020-07-01 DIAGNOSIS — E7841 Elevated Lipoprotein(a): Secondary | ICD-10-CM | POA: Diagnosis not present

## 2020-07-01 DIAGNOSIS — D631 Anemia in chronic kidney disease: Secondary | ICD-10-CM | POA: Diagnosis not present

## 2020-07-01 DIAGNOSIS — N186 End stage renal disease: Secondary | ICD-10-CM | POA: Diagnosis not present

## 2020-07-01 DIAGNOSIS — E440 Moderate protein-calorie malnutrition: Secondary | ICD-10-CM | POA: Diagnosis not present

## 2020-07-01 DIAGNOSIS — N2581 Secondary hyperparathyroidism of renal origin: Secondary | ICD-10-CM | POA: Diagnosis not present

## 2020-07-01 DIAGNOSIS — N2589 Other disorders resulting from impaired renal tubular function: Secondary | ICD-10-CM | POA: Diagnosis not present

## 2020-07-02 DIAGNOSIS — N2589 Other disorders resulting from impaired renal tubular function: Secondary | ICD-10-CM | POA: Diagnosis not present

## 2020-07-02 DIAGNOSIS — E440 Moderate protein-calorie malnutrition: Secondary | ICD-10-CM | POA: Diagnosis not present

## 2020-07-02 DIAGNOSIS — Z992 Dependence on renal dialysis: Secondary | ICD-10-CM | POA: Diagnosis not present

## 2020-07-02 DIAGNOSIS — Z79899 Other long term (current) drug therapy: Secondary | ICD-10-CM | POA: Diagnosis not present

## 2020-07-02 DIAGNOSIS — E7841 Elevated Lipoprotein(a): Secondary | ICD-10-CM | POA: Diagnosis not present

## 2020-07-02 DIAGNOSIS — N186 End stage renal disease: Secondary | ICD-10-CM | POA: Diagnosis not present

## 2020-07-02 DIAGNOSIS — D631 Anemia in chronic kidney disease: Secondary | ICD-10-CM | POA: Diagnosis not present

## 2020-07-02 DIAGNOSIS — D509 Iron deficiency anemia, unspecified: Secondary | ICD-10-CM | POA: Diagnosis not present

## 2020-07-02 DIAGNOSIS — N2581 Secondary hyperparathyroidism of renal origin: Secondary | ICD-10-CM | POA: Diagnosis not present

## 2020-07-03 DIAGNOSIS — Z79899 Other long term (current) drug therapy: Secondary | ICD-10-CM | POA: Diagnosis not present

## 2020-07-03 DIAGNOSIS — E440 Moderate protein-calorie malnutrition: Secondary | ICD-10-CM | POA: Diagnosis not present

## 2020-07-03 DIAGNOSIS — N2581 Secondary hyperparathyroidism of renal origin: Secondary | ICD-10-CM | POA: Diagnosis not present

## 2020-07-03 DIAGNOSIS — E7841 Elevated Lipoprotein(a): Secondary | ICD-10-CM | POA: Diagnosis not present

## 2020-07-03 DIAGNOSIS — D509 Iron deficiency anemia, unspecified: Secondary | ICD-10-CM | POA: Diagnosis not present

## 2020-07-03 DIAGNOSIS — N186 End stage renal disease: Secondary | ICD-10-CM | POA: Diagnosis not present

## 2020-07-03 DIAGNOSIS — D631 Anemia in chronic kidney disease: Secondary | ICD-10-CM | POA: Diagnosis not present

## 2020-07-03 DIAGNOSIS — Z992 Dependence on renal dialysis: Secondary | ICD-10-CM | POA: Diagnosis not present

## 2020-07-03 DIAGNOSIS — N2589 Other disorders resulting from impaired renal tubular function: Secondary | ICD-10-CM | POA: Diagnosis not present

## 2020-07-04 DIAGNOSIS — N186 End stage renal disease: Secondary | ICD-10-CM | POA: Diagnosis not present

## 2020-07-04 DIAGNOSIS — D631 Anemia in chronic kidney disease: Secondary | ICD-10-CM | POA: Diagnosis not present

## 2020-07-04 DIAGNOSIS — D509 Iron deficiency anemia, unspecified: Secondary | ICD-10-CM | POA: Diagnosis not present

## 2020-07-04 DIAGNOSIS — Z79899 Other long term (current) drug therapy: Secondary | ICD-10-CM | POA: Diagnosis not present

## 2020-07-04 DIAGNOSIS — E7841 Elevated Lipoprotein(a): Secondary | ICD-10-CM | POA: Diagnosis not present

## 2020-07-04 DIAGNOSIS — Z992 Dependence on renal dialysis: Secondary | ICD-10-CM | POA: Diagnosis not present

## 2020-07-04 DIAGNOSIS — E440 Moderate protein-calorie malnutrition: Secondary | ICD-10-CM | POA: Diagnosis not present

## 2020-07-04 DIAGNOSIS — N2589 Other disorders resulting from impaired renal tubular function: Secondary | ICD-10-CM | POA: Diagnosis not present

## 2020-07-04 DIAGNOSIS — N2581 Secondary hyperparathyroidism of renal origin: Secondary | ICD-10-CM | POA: Diagnosis not present

## 2020-07-05 DIAGNOSIS — N2589 Other disorders resulting from impaired renal tubular function: Secondary | ICD-10-CM | POA: Diagnosis not present

## 2020-07-05 DIAGNOSIS — Z79899 Other long term (current) drug therapy: Secondary | ICD-10-CM | POA: Diagnosis not present

## 2020-07-05 DIAGNOSIS — D631 Anemia in chronic kidney disease: Secondary | ICD-10-CM | POA: Diagnosis not present

## 2020-07-05 DIAGNOSIS — E7841 Elevated Lipoprotein(a): Secondary | ICD-10-CM | POA: Diagnosis not present

## 2020-07-05 DIAGNOSIS — N186 End stage renal disease: Secondary | ICD-10-CM | POA: Diagnosis not present

## 2020-07-05 DIAGNOSIS — D509 Iron deficiency anemia, unspecified: Secondary | ICD-10-CM | POA: Diagnosis not present

## 2020-07-05 DIAGNOSIS — N2581 Secondary hyperparathyroidism of renal origin: Secondary | ICD-10-CM | POA: Diagnosis not present

## 2020-07-05 DIAGNOSIS — Z992 Dependence on renal dialysis: Secondary | ICD-10-CM | POA: Diagnosis not present

## 2020-07-05 DIAGNOSIS — E440 Moderate protein-calorie malnutrition: Secondary | ICD-10-CM | POA: Diagnosis not present

## 2020-07-06 DIAGNOSIS — N2589 Other disorders resulting from impaired renal tubular function: Secondary | ICD-10-CM | POA: Diagnosis not present

## 2020-07-06 DIAGNOSIS — Z992 Dependence on renal dialysis: Secondary | ICD-10-CM | POA: Diagnosis not present

## 2020-07-06 DIAGNOSIS — D631 Anemia in chronic kidney disease: Secondary | ICD-10-CM | POA: Diagnosis not present

## 2020-07-06 DIAGNOSIS — E440 Moderate protein-calorie malnutrition: Secondary | ICD-10-CM | POA: Diagnosis not present

## 2020-07-06 DIAGNOSIS — N2581 Secondary hyperparathyroidism of renal origin: Secondary | ICD-10-CM | POA: Diagnosis not present

## 2020-07-06 DIAGNOSIS — Z4932 Encounter for adequacy testing for peritoneal dialysis: Secondary | ICD-10-CM | POA: Diagnosis not present

## 2020-07-06 DIAGNOSIS — Z79899 Other long term (current) drug therapy: Secondary | ICD-10-CM | POA: Diagnosis not present

## 2020-07-06 DIAGNOSIS — N186 End stage renal disease: Secondary | ICD-10-CM | POA: Diagnosis not present

## 2020-07-06 DIAGNOSIS — D509 Iron deficiency anemia, unspecified: Secondary | ICD-10-CM | POA: Diagnosis not present

## 2020-07-07 DIAGNOSIS — Z992 Dependence on renal dialysis: Secondary | ICD-10-CM | POA: Diagnosis not present

## 2020-07-07 DIAGNOSIS — D509 Iron deficiency anemia, unspecified: Secondary | ICD-10-CM | POA: Diagnosis not present

## 2020-07-07 DIAGNOSIS — E440 Moderate protein-calorie malnutrition: Secondary | ICD-10-CM | POA: Diagnosis not present

## 2020-07-07 DIAGNOSIS — N2581 Secondary hyperparathyroidism of renal origin: Secondary | ICD-10-CM | POA: Diagnosis not present

## 2020-07-07 DIAGNOSIS — Z79899 Other long term (current) drug therapy: Secondary | ICD-10-CM | POA: Diagnosis not present

## 2020-07-07 DIAGNOSIS — N2589 Other disorders resulting from impaired renal tubular function: Secondary | ICD-10-CM | POA: Diagnosis not present

## 2020-07-07 DIAGNOSIS — D631 Anemia in chronic kidney disease: Secondary | ICD-10-CM | POA: Diagnosis not present

## 2020-07-07 DIAGNOSIS — Z4932 Encounter for adequacy testing for peritoneal dialysis: Secondary | ICD-10-CM | POA: Diagnosis not present

## 2020-07-07 DIAGNOSIS — N186 End stage renal disease: Secondary | ICD-10-CM | POA: Diagnosis not present

## 2020-07-08 DIAGNOSIS — D509 Iron deficiency anemia, unspecified: Secondary | ICD-10-CM | POA: Diagnosis not present

## 2020-07-08 DIAGNOSIS — N186 End stage renal disease: Secondary | ICD-10-CM | POA: Diagnosis not present

## 2020-07-08 DIAGNOSIS — D631 Anemia in chronic kidney disease: Secondary | ICD-10-CM | POA: Diagnosis not present

## 2020-07-08 DIAGNOSIS — Z79899 Other long term (current) drug therapy: Secondary | ICD-10-CM | POA: Diagnosis not present

## 2020-07-08 DIAGNOSIS — Z992 Dependence on renal dialysis: Secondary | ICD-10-CM | POA: Diagnosis not present

## 2020-07-08 DIAGNOSIS — E440 Moderate protein-calorie malnutrition: Secondary | ICD-10-CM | POA: Diagnosis not present

## 2020-07-08 DIAGNOSIS — Z4932 Encounter for adequacy testing for peritoneal dialysis: Secondary | ICD-10-CM | POA: Diagnosis not present

## 2020-07-08 DIAGNOSIS — N2581 Secondary hyperparathyroidism of renal origin: Secondary | ICD-10-CM | POA: Diagnosis not present

## 2020-07-08 DIAGNOSIS — N2589 Other disorders resulting from impaired renal tubular function: Secondary | ICD-10-CM | POA: Diagnosis not present

## 2020-07-09 DIAGNOSIS — Z992 Dependence on renal dialysis: Secondary | ICD-10-CM | POA: Diagnosis not present

## 2020-07-09 DIAGNOSIS — N2581 Secondary hyperparathyroidism of renal origin: Secondary | ICD-10-CM | POA: Diagnosis not present

## 2020-07-09 DIAGNOSIS — N186 End stage renal disease: Secondary | ICD-10-CM | POA: Diagnosis not present

## 2020-07-09 DIAGNOSIS — E440 Moderate protein-calorie malnutrition: Secondary | ICD-10-CM | POA: Diagnosis not present

## 2020-07-09 DIAGNOSIS — Z4932 Encounter for adequacy testing for peritoneal dialysis: Secondary | ICD-10-CM | POA: Diagnosis not present

## 2020-07-09 DIAGNOSIS — D509 Iron deficiency anemia, unspecified: Secondary | ICD-10-CM | POA: Diagnosis not present

## 2020-07-09 DIAGNOSIS — Z79899 Other long term (current) drug therapy: Secondary | ICD-10-CM | POA: Diagnosis not present

## 2020-07-09 DIAGNOSIS — N2589 Other disorders resulting from impaired renal tubular function: Secondary | ICD-10-CM | POA: Diagnosis not present

## 2020-07-09 DIAGNOSIS — D631 Anemia in chronic kidney disease: Secondary | ICD-10-CM | POA: Diagnosis not present

## 2020-07-10 DIAGNOSIS — D631 Anemia in chronic kidney disease: Secondary | ICD-10-CM | POA: Diagnosis not present

## 2020-07-10 DIAGNOSIS — Z992 Dependence on renal dialysis: Secondary | ICD-10-CM | POA: Diagnosis not present

## 2020-07-10 DIAGNOSIS — N186 End stage renal disease: Secondary | ICD-10-CM | POA: Diagnosis not present

## 2020-07-10 DIAGNOSIS — Z4932 Encounter for adequacy testing for peritoneal dialysis: Secondary | ICD-10-CM | POA: Diagnosis not present

## 2020-07-10 DIAGNOSIS — E440 Moderate protein-calorie malnutrition: Secondary | ICD-10-CM | POA: Diagnosis not present

## 2020-07-10 DIAGNOSIS — N2589 Other disorders resulting from impaired renal tubular function: Secondary | ICD-10-CM | POA: Diagnosis not present

## 2020-07-10 DIAGNOSIS — N2581 Secondary hyperparathyroidism of renal origin: Secondary | ICD-10-CM | POA: Diagnosis not present

## 2020-07-10 DIAGNOSIS — D509 Iron deficiency anemia, unspecified: Secondary | ICD-10-CM | POA: Diagnosis not present

## 2020-07-10 DIAGNOSIS — Z79899 Other long term (current) drug therapy: Secondary | ICD-10-CM | POA: Diagnosis not present

## 2020-07-11 DIAGNOSIS — Z79899 Other long term (current) drug therapy: Secondary | ICD-10-CM | POA: Diagnosis not present

## 2020-07-11 DIAGNOSIS — D631 Anemia in chronic kidney disease: Secondary | ICD-10-CM | POA: Diagnosis not present

## 2020-07-11 DIAGNOSIS — N2581 Secondary hyperparathyroidism of renal origin: Secondary | ICD-10-CM | POA: Diagnosis not present

## 2020-07-11 DIAGNOSIS — Z992 Dependence on renal dialysis: Secondary | ICD-10-CM | POA: Diagnosis not present

## 2020-07-11 DIAGNOSIS — E440 Moderate protein-calorie malnutrition: Secondary | ICD-10-CM | POA: Diagnosis not present

## 2020-07-11 DIAGNOSIS — Z4932 Encounter for adequacy testing for peritoneal dialysis: Secondary | ICD-10-CM | POA: Diagnosis not present

## 2020-07-11 DIAGNOSIS — N186 End stage renal disease: Secondary | ICD-10-CM | POA: Diagnosis not present

## 2020-07-11 DIAGNOSIS — D509 Iron deficiency anemia, unspecified: Secondary | ICD-10-CM | POA: Diagnosis not present

## 2020-07-11 DIAGNOSIS — N2589 Other disorders resulting from impaired renal tubular function: Secondary | ICD-10-CM | POA: Diagnosis not present

## 2020-07-12 DIAGNOSIS — N2581 Secondary hyperparathyroidism of renal origin: Secondary | ICD-10-CM | POA: Diagnosis not present

## 2020-07-12 DIAGNOSIS — Z79899 Other long term (current) drug therapy: Secondary | ICD-10-CM | POA: Diagnosis not present

## 2020-07-12 DIAGNOSIS — D509 Iron deficiency anemia, unspecified: Secondary | ICD-10-CM | POA: Diagnosis not present

## 2020-07-12 DIAGNOSIS — Z992 Dependence on renal dialysis: Secondary | ICD-10-CM | POA: Diagnosis not present

## 2020-07-12 DIAGNOSIS — Z4932 Encounter for adequacy testing for peritoneal dialysis: Secondary | ICD-10-CM | POA: Diagnosis not present

## 2020-07-12 DIAGNOSIS — D631 Anemia in chronic kidney disease: Secondary | ICD-10-CM | POA: Diagnosis not present

## 2020-07-12 DIAGNOSIS — N186 End stage renal disease: Secondary | ICD-10-CM | POA: Diagnosis not present

## 2020-07-12 DIAGNOSIS — E440 Moderate protein-calorie malnutrition: Secondary | ICD-10-CM | POA: Diagnosis not present

## 2020-07-12 DIAGNOSIS — N2589 Other disorders resulting from impaired renal tubular function: Secondary | ICD-10-CM | POA: Diagnosis not present

## 2020-07-13 DIAGNOSIS — Z992 Dependence on renal dialysis: Secondary | ICD-10-CM | POA: Diagnosis not present

## 2020-07-13 DIAGNOSIS — N2581 Secondary hyperparathyroidism of renal origin: Secondary | ICD-10-CM | POA: Diagnosis not present

## 2020-07-13 DIAGNOSIS — N2589 Other disorders resulting from impaired renal tubular function: Secondary | ICD-10-CM | POA: Diagnosis not present

## 2020-07-13 DIAGNOSIS — E440 Moderate protein-calorie malnutrition: Secondary | ICD-10-CM | POA: Diagnosis not present

## 2020-07-13 DIAGNOSIS — D509 Iron deficiency anemia, unspecified: Secondary | ICD-10-CM | POA: Diagnosis not present

## 2020-07-13 DIAGNOSIS — Z79899 Other long term (current) drug therapy: Secondary | ICD-10-CM | POA: Diagnosis not present

## 2020-07-13 DIAGNOSIS — N186 End stage renal disease: Secondary | ICD-10-CM | POA: Diagnosis not present

## 2020-07-13 DIAGNOSIS — D631 Anemia in chronic kidney disease: Secondary | ICD-10-CM | POA: Diagnosis not present

## 2020-07-13 DIAGNOSIS — Z4932 Encounter for adequacy testing for peritoneal dialysis: Secondary | ICD-10-CM | POA: Diagnosis not present

## 2020-07-14 DIAGNOSIS — E440 Moderate protein-calorie malnutrition: Secondary | ICD-10-CM | POA: Diagnosis not present

## 2020-07-14 DIAGNOSIS — D631 Anemia in chronic kidney disease: Secondary | ICD-10-CM | POA: Diagnosis not present

## 2020-07-14 DIAGNOSIS — D509 Iron deficiency anemia, unspecified: Secondary | ICD-10-CM | POA: Diagnosis not present

## 2020-07-14 DIAGNOSIS — N2581 Secondary hyperparathyroidism of renal origin: Secondary | ICD-10-CM | POA: Diagnosis not present

## 2020-07-14 DIAGNOSIS — N186 End stage renal disease: Secondary | ICD-10-CM | POA: Diagnosis not present

## 2020-07-14 DIAGNOSIS — Z79899 Other long term (current) drug therapy: Secondary | ICD-10-CM | POA: Diagnosis not present

## 2020-07-14 DIAGNOSIS — Z4932 Encounter for adequacy testing for peritoneal dialysis: Secondary | ICD-10-CM | POA: Diagnosis not present

## 2020-07-14 DIAGNOSIS — Z992 Dependence on renal dialysis: Secondary | ICD-10-CM | POA: Diagnosis not present

## 2020-07-14 DIAGNOSIS — N2589 Other disorders resulting from impaired renal tubular function: Secondary | ICD-10-CM | POA: Diagnosis not present

## 2020-07-15 DIAGNOSIS — N2589 Other disorders resulting from impaired renal tubular function: Secondary | ICD-10-CM | POA: Diagnosis not present

## 2020-07-15 DIAGNOSIS — Z992 Dependence on renal dialysis: Secondary | ICD-10-CM | POA: Diagnosis not present

## 2020-07-15 DIAGNOSIS — D631 Anemia in chronic kidney disease: Secondary | ICD-10-CM | POA: Diagnosis not present

## 2020-07-15 DIAGNOSIS — D509 Iron deficiency anemia, unspecified: Secondary | ICD-10-CM | POA: Diagnosis not present

## 2020-07-15 DIAGNOSIS — Z4932 Encounter for adequacy testing for peritoneal dialysis: Secondary | ICD-10-CM | POA: Diagnosis not present

## 2020-07-15 DIAGNOSIS — Z79899 Other long term (current) drug therapy: Secondary | ICD-10-CM | POA: Diagnosis not present

## 2020-07-15 DIAGNOSIS — E440 Moderate protein-calorie malnutrition: Secondary | ICD-10-CM | POA: Diagnosis not present

## 2020-07-15 DIAGNOSIS — N186 End stage renal disease: Secondary | ICD-10-CM | POA: Diagnosis not present

## 2020-07-15 DIAGNOSIS — N2581 Secondary hyperparathyroidism of renal origin: Secondary | ICD-10-CM | POA: Diagnosis not present

## 2020-07-16 ENCOUNTER — Telehealth: Payer: Self-pay | Admitting: Family Medicine

## 2020-07-16 DIAGNOSIS — Z992 Dependence on renal dialysis: Secondary | ICD-10-CM | POA: Diagnosis not present

## 2020-07-16 DIAGNOSIS — N2589 Other disorders resulting from impaired renal tubular function: Secondary | ICD-10-CM | POA: Diagnosis not present

## 2020-07-16 DIAGNOSIS — N186 End stage renal disease: Secondary | ICD-10-CM | POA: Diagnosis not present

## 2020-07-16 DIAGNOSIS — Z4932 Encounter for adequacy testing for peritoneal dialysis: Secondary | ICD-10-CM | POA: Diagnosis not present

## 2020-07-16 DIAGNOSIS — D509 Iron deficiency anemia, unspecified: Secondary | ICD-10-CM | POA: Diagnosis not present

## 2020-07-16 DIAGNOSIS — E440 Moderate protein-calorie malnutrition: Secondary | ICD-10-CM | POA: Diagnosis not present

## 2020-07-16 DIAGNOSIS — Z79899 Other long term (current) drug therapy: Secondary | ICD-10-CM | POA: Diagnosis not present

## 2020-07-16 DIAGNOSIS — N2581 Secondary hyperparathyroidism of renal origin: Secondary | ICD-10-CM | POA: Diagnosis not present

## 2020-07-16 DIAGNOSIS — D631 Anemia in chronic kidney disease: Secondary | ICD-10-CM | POA: Diagnosis not present

## 2020-07-16 NOTE — Telephone Encounter (Signed)
Left message for patient to call back and schedule Medicare Annual Wellness Visit (AWV) either virtually or in office. No detailed message left    awv-i per [place] 03/07/17  please schedule at anytime with [facility] Nurse Health Advisor 1 or 2   This should be a 45 minute visit.

## 2020-07-17 DIAGNOSIS — D631 Anemia in chronic kidney disease: Secondary | ICD-10-CM | POA: Diagnosis not present

## 2020-07-17 DIAGNOSIS — E440 Moderate protein-calorie malnutrition: Secondary | ICD-10-CM | POA: Diagnosis not present

## 2020-07-17 DIAGNOSIS — Z992 Dependence on renal dialysis: Secondary | ICD-10-CM | POA: Diagnosis not present

## 2020-07-17 DIAGNOSIS — Z4932 Encounter for adequacy testing for peritoneal dialysis: Secondary | ICD-10-CM | POA: Diagnosis not present

## 2020-07-17 DIAGNOSIS — N2589 Other disorders resulting from impaired renal tubular function: Secondary | ICD-10-CM | POA: Diagnosis not present

## 2020-07-17 DIAGNOSIS — N186 End stage renal disease: Secondary | ICD-10-CM | POA: Diagnosis not present

## 2020-07-17 DIAGNOSIS — D509 Iron deficiency anemia, unspecified: Secondary | ICD-10-CM | POA: Diagnosis not present

## 2020-07-17 DIAGNOSIS — Z79899 Other long term (current) drug therapy: Secondary | ICD-10-CM | POA: Diagnosis not present

## 2020-07-17 DIAGNOSIS — N2581 Secondary hyperparathyroidism of renal origin: Secondary | ICD-10-CM | POA: Diagnosis not present

## 2020-07-18 DIAGNOSIS — Z4932 Encounter for adequacy testing for peritoneal dialysis: Secondary | ICD-10-CM | POA: Diagnosis not present

## 2020-07-18 DIAGNOSIS — D631 Anemia in chronic kidney disease: Secondary | ICD-10-CM | POA: Diagnosis not present

## 2020-07-18 DIAGNOSIS — Z992 Dependence on renal dialysis: Secondary | ICD-10-CM | POA: Diagnosis not present

## 2020-07-18 DIAGNOSIS — N2589 Other disorders resulting from impaired renal tubular function: Secondary | ICD-10-CM | POA: Diagnosis not present

## 2020-07-18 DIAGNOSIS — N186 End stage renal disease: Secondary | ICD-10-CM | POA: Diagnosis not present

## 2020-07-18 DIAGNOSIS — E440 Moderate protein-calorie malnutrition: Secondary | ICD-10-CM | POA: Diagnosis not present

## 2020-07-18 DIAGNOSIS — Z79899 Other long term (current) drug therapy: Secondary | ICD-10-CM | POA: Diagnosis not present

## 2020-07-18 DIAGNOSIS — N2581 Secondary hyperparathyroidism of renal origin: Secondary | ICD-10-CM | POA: Diagnosis not present

## 2020-07-18 DIAGNOSIS — D509 Iron deficiency anemia, unspecified: Secondary | ICD-10-CM | POA: Diagnosis not present

## 2020-07-19 DIAGNOSIS — D509 Iron deficiency anemia, unspecified: Secondary | ICD-10-CM | POA: Diagnosis not present

## 2020-07-19 DIAGNOSIS — E440 Moderate protein-calorie malnutrition: Secondary | ICD-10-CM | POA: Diagnosis not present

## 2020-07-19 DIAGNOSIS — Z79899 Other long term (current) drug therapy: Secondary | ICD-10-CM | POA: Diagnosis not present

## 2020-07-19 DIAGNOSIS — Z992 Dependence on renal dialysis: Secondary | ICD-10-CM | POA: Diagnosis not present

## 2020-07-19 DIAGNOSIS — Z4932 Encounter for adequacy testing for peritoneal dialysis: Secondary | ICD-10-CM | POA: Diagnosis not present

## 2020-07-19 DIAGNOSIS — N2581 Secondary hyperparathyroidism of renal origin: Secondary | ICD-10-CM | POA: Diagnosis not present

## 2020-07-19 DIAGNOSIS — N186 End stage renal disease: Secondary | ICD-10-CM | POA: Diagnosis not present

## 2020-07-19 DIAGNOSIS — D631 Anemia in chronic kidney disease: Secondary | ICD-10-CM | POA: Diagnosis not present

## 2020-07-19 DIAGNOSIS — N2589 Other disorders resulting from impaired renal tubular function: Secondary | ICD-10-CM | POA: Diagnosis not present

## 2020-07-20 DIAGNOSIS — D631 Anemia in chronic kidney disease: Secondary | ICD-10-CM | POA: Diagnosis not present

## 2020-07-20 DIAGNOSIS — Z79899 Other long term (current) drug therapy: Secondary | ICD-10-CM | POA: Diagnosis not present

## 2020-07-20 DIAGNOSIS — N186 End stage renal disease: Secondary | ICD-10-CM | POA: Diagnosis not present

## 2020-07-20 DIAGNOSIS — D509 Iron deficiency anemia, unspecified: Secondary | ICD-10-CM | POA: Diagnosis not present

## 2020-07-20 DIAGNOSIS — E440 Moderate protein-calorie malnutrition: Secondary | ICD-10-CM | POA: Diagnosis not present

## 2020-07-20 DIAGNOSIS — Z992 Dependence on renal dialysis: Secondary | ICD-10-CM | POA: Diagnosis not present

## 2020-07-20 DIAGNOSIS — N2589 Other disorders resulting from impaired renal tubular function: Secondary | ICD-10-CM | POA: Diagnosis not present

## 2020-07-20 DIAGNOSIS — Z4932 Encounter for adequacy testing for peritoneal dialysis: Secondary | ICD-10-CM | POA: Diagnosis not present

## 2020-07-20 DIAGNOSIS — N2581 Secondary hyperparathyroidism of renal origin: Secondary | ICD-10-CM | POA: Diagnosis not present

## 2020-07-21 DIAGNOSIS — Z4932 Encounter for adequacy testing for peritoneal dialysis: Secondary | ICD-10-CM | POA: Diagnosis not present

## 2020-07-21 DIAGNOSIS — D509 Iron deficiency anemia, unspecified: Secondary | ICD-10-CM | POA: Diagnosis not present

## 2020-07-21 DIAGNOSIS — Z992 Dependence on renal dialysis: Secondary | ICD-10-CM | POA: Diagnosis not present

## 2020-07-21 DIAGNOSIS — N186 End stage renal disease: Secondary | ICD-10-CM | POA: Diagnosis not present

## 2020-07-21 DIAGNOSIS — N2581 Secondary hyperparathyroidism of renal origin: Secondary | ICD-10-CM | POA: Diagnosis not present

## 2020-07-21 DIAGNOSIS — D631 Anemia in chronic kidney disease: Secondary | ICD-10-CM | POA: Diagnosis not present

## 2020-07-21 DIAGNOSIS — E440 Moderate protein-calorie malnutrition: Secondary | ICD-10-CM | POA: Diagnosis not present

## 2020-07-21 DIAGNOSIS — N2589 Other disorders resulting from impaired renal tubular function: Secondary | ICD-10-CM | POA: Diagnosis not present

## 2020-07-21 DIAGNOSIS — Z79899 Other long term (current) drug therapy: Secondary | ICD-10-CM | POA: Diagnosis not present

## 2020-07-22 DIAGNOSIS — D631 Anemia in chronic kidney disease: Secondary | ICD-10-CM | POA: Diagnosis not present

## 2020-07-22 DIAGNOSIS — N2581 Secondary hyperparathyroidism of renal origin: Secondary | ICD-10-CM | POA: Diagnosis not present

## 2020-07-22 DIAGNOSIS — Z4932 Encounter for adequacy testing for peritoneal dialysis: Secondary | ICD-10-CM | POA: Diagnosis not present

## 2020-07-22 DIAGNOSIS — Z79899 Other long term (current) drug therapy: Secondary | ICD-10-CM | POA: Diagnosis not present

## 2020-07-22 DIAGNOSIS — E440 Moderate protein-calorie malnutrition: Secondary | ICD-10-CM | POA: Diagnosis not present

## 2020-07-22 DIAGNOSIS — N2589 Other disorders resulting from impaired renal tubular function: Secondary | ICD-10-CM | POA: Diagnosis not present

## 2020-07-22 DIAGNOSIS — D509 Iron deficiency anemia, unspecified: Secondary | ICD-10-CM | POA: Diagnosis not present

## 2020-07-22 DIAGNOSIS — N186 End stage renal disease: Secondary | ICD-10-CM | POA: Diagnosis not present

## 2020-07-22 DIAGNOSIS — Z992 Dependence on renal dialysis: Secondary | ICD-10-CM | POA: Diagnosis not present

## 2020-07-23 DIAGNOSIS — D631 Anemia in chronic kidney disease: Secondary | ICD-10-CM | POA: Diagnosis not present

## 2020-07-23 DIAGNOSIS — D509 Iron deficiency anemia, unspecified: Secondary | ICD-10-CM | POA: Diagnosis not present

## 2020-07-23 DIAGNOSIS — Z79899 Other long term (current) drug therapy: Secondary | ICD-10-CM | POA: Diagnosis not present

## 2020-07-23 DIAGNOSIS — E440 Moderate protein-calorie malnutrition: Secondary | ICD-10-CM | POA: Diagnosis not present

## 2020-07-23 DIAGNOSIS — N186 End stage renal disease: Secondary | ICD-10-CM | POA: Diagnosis not present

## 2020-07-23 DIAGNOSIS — N2589 Other disorders resulting from impaired renal tubular function: Secondary | ICD-10-CM | POA: Diagnosis not present

## 2020-07-23 DIAGNOSIS — Z992 Dependence on renal dialysis: Secondary | ICD-10-CM | POA: Diagnosis not present

## 2020-07-23 DIAGNOSIS — Z4932 Encounter for adequacy testing for peritoneal dialysis: Secondary | ICD-10-CM | POA: Diagnosis not present

## 2020-07-23 DIAGNOSIS — N2581 Secondary hyperparathyroidism of renal origin: Secondary | ICD-10-CM | POA: Diagnosis not present

## 2020-07-24 DIAGNOSIS — N2581 Secondary hyperparathyroidism of renal origin: Secondary | ICD-10-CM | POA: Diagnosis not present

## 2020-07-24 DIAGNOSIS — N2589 Other disorders resulting from impaired renal tubular function: Secondary | ICD-10-CM | POA: Diagnosis not present

## 2020-07-24 DIAGNOSIS — N186 End stage renal disease: Secondary | ICD-10-CM | POA: Diagnosis not present

## 2020-07-24 DIAGNOSIS — E440 Moderate protein-calorie malnutrition: Secondary | ICD-10-CM | POA: Diagnosis not present

## 2020-07-24 DIAGNOSIS — D631 Anemia in chronic kidney disease: Secondary | ICD-10-CM | POA: Diagnosis not present

## 2020-07-24 DIAGNOSIS — Z4932 Encounter for adequacy testing for peritoneal dialysis: Secondary | ICD-10-CM | POA: Diagnosis not present

## 2020-07-24 DIAGNOSIS — D509 Iron deficiency anemia, unspecified: Secondary | ICD-10-CM | POA: Diagnosis not present

## 2020-07-24 DIAGNOSIS — Z992 Dependence on renal dialysis: Secondary | ICD-10-CM | POA: Diagnosis not present

## 2020-07-24 DIAGNOSIS — Z79899 Other long term (current) drug therapy: Secondary | ICD-10-CM | POA: Diagnosis not present

## 2020-07-25 DIAGNOSIS — N2589 Other disorders resulting from impaired renal tubular function: Secondary | ICD-10-CM | POA: Diagnosis not present

## 2020-07-25 DIAGNOSIS — N2581 Secondary hyperparathyroidism of renal origin: Secondary | ICD-10-CM | POA: Diagnosis not present

## 2020-07-25 DIAGNOSIS — E440 Moderate protein-calorie malnutrition: Secondary | ICD-10-CM | POA: Diagnosis not present

## 2020-07-25 DIAGNOSIS — D509 Iron deficiency anemia, unspecified: Secondary | ICD-10-CM | POA: Diagnosis not present

## 2020-07-25 DIAGNOSIS — Z992 Dependence on renal dialysis: Secondary | ICD-10-CM | POA: Diagnosis not present

## 2020-07-25 DIAGNOSIS — D631 Anemia in chronic kidney disease: Secondary | ICD-10-CM | POA: Diagnosis not present

## 2020-07-25 DIAGNOSIS — Z4932 Encounter for adequacy testing for peritoneal dialysis: Secondary | ICD-10-CM | POA: Diagnosis not present

## 2020-07-25 DIAGNOSIS — Z79899 Other long term (current) drug therapy: Secondary | ICD-10-CM | POA: Diagnosis not present

## 2020-07-25 DIAGNOSIS — N186 End stage renal disease: Secondary | ICD-10-CM | POA: Diagnosis not present

## 2020-07-26 DIAGNOSIS — D509 Iron deficiency anemia, unspecified: Secondary | ICD-10-CM | POA: Diagnosis not present

## 2020-07-26 DIAGNOSIS — E440 Moderate protein-calorie malnutrition: Secondary | ICD-10-CM | POA: Diagnosis not present

## 2020-07-26 DIAGNOSIS — N186 End stage renal disease: Secondary | ICD-10-CM | POA: Diagnosis not present

## 2020-07-26 DIAGNOSIS — N2589 Other disorders resulting from impaired renal tubular function: Secondary | ICD-10-CM | POA: Diagnosis not present

## 2020-07-26 DIAGNOSIS — Z4932 Encounter for adequacy testing for peritoneal dialysis: Secondary | ICD-10-CM | POA: Diagnosis not present

## 2020-07-26 DIAGNOSIS — N2581 Secondary hyperparathyroidism of renal origin: Secondary | ICD-10-CM | POA: Diagnosis not present

## 2020-07-26 DIAGNOSIS — Z992 Dependence on renal dialysis: Secondary | ICD-10-CM | POA: Diagnosis not present

## 2020-07-26 DIAGNOSIS — Z79899 Other long term (current) drug therapy: Secondary | ICD-10-CM | POA: Diagnosis not present

## 2020-07-26 DIAGNOSIS — D631 Anemia in chronic kidney disease: Secondary | ICD-10-CM | POA: Diagnosis not present

## 2020-07-27 DIAGNOSIS — N2581 Secondary hyperparathyroidism of renal origin: Secondary | ICD-10-CM | POA: Diagnosis not present

## 2020-07-27 DIAGNOSIS — Z4932 Encounter for adequacy testing for peritoneal dialysis: Secondary | ICD-10-CM | POA: Diagnosis not present

## 2020-07-27 DIAGNOSIS — Z992 Dependence on renal dialysis: Secondary | ICD-10-CM | POA: Diagnosis not present

## 2020-07-27 DIAGNOSIS — D631 Anemia in chronic kidney disease: Secondary | ICD-10-CM | POA: Diagnosis not present

## 2020-07-27 DIAGNOSIS — Z79899 Other long term (current) drug therapy: Secondary | ICD-10-CM | POA: Diagnosis not present

## 2020-07-27 DIAGNOSIS — N2589 Other disorders resulting from impaired renal tubular function: Secondary | ICD-10-CM | POA: Diagnosis not present

## 2020-07-27 DIAGNOSIS — E440 Moderate protein-calorie malnutrition: Secondary | ICD-10-CM | POA: Diagnosis not present

## 2020-07-27 DIAGNOSIS — D509 Iron deficiency anemia, unspecified: Secondary | ICD-10-CM | POA: Diagnosis not present

## 2020-07-27 DIAGNOSIS — N186 End stage renal disease: Secondary | ICD-10-CM | POA: Diagnosis not present

## 2020-07-28 DIAGNOSIS — Z992 Dependence on renal dialysis: Secondary | ICD-10-CM | POA: Diagnosis not present

## 2020-07-28 DIAGNOSIS — Z79899 Other long term (current) drug therapy: Secondary | ICD-10-CM | POA: Diagnosis not present

## 2020-07-28 DIAGNOSIS — N2581 Secondary hyperparathyroidism of renal origin: Secondary | ICD-10-CM | POA: Diagnosis not present

## 2020-07-28 DIAGNOSIS — N2589 Other disorders resulting from impaired renal tubular function: Secondary | ICD-10-CM | POA: Diagnosis not present

## 2020-07-28 DIAGNOSIS — D631 Anemia in chronic kidney disease: Secondary | ICD-10-CM | POA: Diagnosis not present

## 2020-07-28 DIAGNOSIS — N186 End stage renal disease: Secondary | ICD-10-CM | POA: Diagnosis not present

## 2020-07-28 DIAGNOSIS — E440 Moderate protein-calorie malnutrition: Secondary | ICD-10-CM | POA: Diagnosis not present

## 2020-07-28 DIAGNOSIS — Z4932 Encounter for adequacy testing for peritoneal dialysis: Secondary | ICD-10-CM | POA: Diagnosis not present

## 2020-07-28 DIAGNOSIS — D509 Iron deficiency anemia, unspecified: Secondary | ICD-10-CM | POA: Diagnosis not present

## 2020-07-29 DIAGNOSIS — D509 Iron deficiency anemia, unspecified: Secondary | ICD-10-CM | POA: Diagnosis not present

## 2020-07-29 DIAGNOSIS — Z79899 Other long term (current) drug therapy: Secondary | ICD-10-CM | POA: Diagnosis not present

## 2020-07-29 DIAGNOSIS — N2589 Other disorders resulting from impaired renal tubular function: Secondary | ICD-10-CM | POA: Diagnosis not present

## 2020-07-29 DIAGNOSIS — Z992 Dependence on renal dialysis: Secondary | ICD-10-CM | POA: Diagnosis not present

## 2020-07-29 DIAGNOSIS — E440 Moderate protein-calorie malnutrition: Secondary | ICD-10-CM | POA: Diagnosis not present

## 2020-07-29 DIAGNOSIS — D631 Anemia in chronic kidney disease: Secondary | ICD-10-CM | POA: Diagnosis not present

## 2020-07-29 DIAGNOSIS — N186 End stage renal disease: Secondary | ICD-10-CM | POA: Diagnosis not present

## 2020-07-29 DIAGNOSIS — Z4932 Encounter for adequacy testing for peritoneal dialysis: Secondary | ICD-10-CM | POA: Diagnosis not present

## 2020-07-29 DIAGNOSIS — N2581 Secondary hyperparathyroidism of renal origin: Secondary | ICD-10-CM | POA: Diagnosis not present

## 2020-07-30 DIAGNOSIS — D631 Anemia in chronic kidney disease: Secondary | ICD-10-CM | POA: Diagnosis not present

## 2020-07-30 DIAGNOSIS — N186 End stage renal disease: Secondary | ICD-10-CM | POA: Diagnosis not present

## 2020-07-30 DIAGNOSIS — D509 Iron deficiency anemia, unspecified: Secondary | ICD-10-CM | POA: Diagnosis not present

## 2020-07-30 DIAGNOSIS — Z4932 Encounter for adequacy testing for peritoneal dialysis: Secondary | ICD-10-CM | POA: Diagnosis not present

## 2020-07-30 DIAGNOSIS — Z992 Dependence on renal dialysis: Secondary | ICD-10-CM | POA: Diagnosis not present

## 2020-07-30 DIAGNOSIS — E440 Moderate protein-calorie malnutrition: Secondary | ICD-10-CM | POA: Diagnosis not present

## 2020-07-30 DIAGNOSIS — Z79899 Other long term (current) drug therapy: Secondary | ICD-10-CM | POA: Diagnosis not present

## 2020-07-30 DIAGNOSIS — N2589 Other disorders resulting from impaired renal tubular function: Secondary | ICD-10-CM | POA: Diagnosis not present

## 2020-07-30 DIAGNOSIS — N2581 Secondary hyperparathyroidism of renal origin: Secondary | ICD-10-CM | POA: Diagnosis not present

## 2020-07-31 DIAGNOSIS — N186 End stage renal disease: Secondary | ICD-10-CM | POA: Diagnosis not present

## 2020-07-31 DIAGNOSIS — N2581 Secondary hyperparathyroidism of renal origin: Secondary | ICD-10-CM | POA: Diagnosis not present

## 2020-07-31 DIAGNOSIS — D509 Iron deficiency anemia, unspecified: Secondary | ICD-10-CM | POA: Diagnosis not present

## 2020-07-31 DIAGNOSIS — Z992 Dependence on renal dialysis: Secondary | ICD-10-CM | POA: Diagnosis not present

## 2020-07-31 DIAGNOSIS — Z4932 Encounter for adequacy testing for peritoneal dialysis: Secondary | ICD-10-CM | POA: Diagnosis not present

## 2020-07-31 DIAGNOSIS — D631 Anemia in chronic kidney disease: Secondary | ICD-10-CM | POA: Diagnosis not present

## 2020-07-31 DIAGNOSIS — E440 Moderate protein-calorie malnutrition: Secondary | ICD-10-CM | POA: Diagnosis not present

## 2020-07-31 DIAGNOSIS — N2589 Other disorders resulting from impaired renal tubular function: Secondary | ICD-10-CM | POA: Diagnosis not present

## 2020-07-31 DIAGNOSIS — Z79899 Other long term (current) drug therapy: Secondary | ICD-10-CM | POA: Diagnosis not present

## 2020-08-01 DIAGNOSIS — Z79899 Other long term (current) drug therapy: Secondary | ICD-10-CM | POA: Diagnosis not present

## 2020-08-01 DIAGNOSIS — N2581 Secondary hyperparathyroidism of renal origin: Secondary | ICD-10-CM | POA: Diagnosis not present

## 2020-08-01 DIAGNOSIS — Z992 Dependence on renal dialysis: Secondary | ICD-10-CM | POA: Diagnosis not present

## 2020-08-01 DIAGNOSIS — D509 Iron deficiency anemia, unspecified: Secondary | ICD-10-CM | POA: Diagnosis not present

## 2020-08-01 DIAGNOSIS — D631 Anemia in chronic kidney disease: Secondary | ICD-10-CM | POA: Diagnosis not present

## 2020-08-01 DIAGNOSIS — N2589 Other disorders resulting from impaired renal tubular function: Secondary | ICD-10-CM | POA: Diagnosis not present

## 2020-08-01 DIAGNOSIS — N186 End stage renal disease: Secondary | ICD-10-CM | POA: Diagnosis not present

## 2020-08-01 DIAGNOSIS — Z4932 Encounter for adequacy testing for peritoneal dialysis: Secondary | ICD-10-CM | POA: Diagnosis not present

## 2020-08-01 DIAGNOSIS — E440 Moderate protein-calorie malnutrition: Secondary | ICD-10-CM | POA: Diagnosis not present

## 2020-08-02 DIAGNOSIS — Z7682 Awaiting organ transplant status: Secondary | ICD-10-CM | POA: Diagnosis not present

## 2020-08-02 DIAGNOSIS — N2581 Secondary hyperparathyroidism of renal origin: Secondary | ICD-10-CM | POA: Diagnosis not present

## 2020-08-02 DIAGNOSIS — N2589 Other disorders resulting from impaired renal tubular function: Secondary | ICD-10-CM | POA: Diagnosis not present

## 2020-08-02 DIAGNOSIS — Z79899 Other long term (current) drug therapy: Secondary | ICD-10-CM | POA: Diagnosis not present

## 2020-08-02 DIAGNOSIS — D509 Iron deficiency anemia, unspecified: Secondary | ICD-10-CM | POA: Diagnosis not present

## 2020-08-02 DIAGNOSIS — Z720 Tobacco use: Secondary | ICD-10-CM | POA: Diagnosis not present

## 2020-08-02 DIAGNOSIS — Z4932 Encounter for adequacy testing for peritoneal dialysis: Secondary | ICD-10-CM | POA: Diagnosis not present

## 2020-08-02 DIAGNOSIS — D631 Anemia in chronic kidney disease: Secondary | ICD-10-CM | POA: Diagnosis not present

## 2020-08-02 DIAGNOSIS — Z992 Dependence on renal dialysis: Secondary | ICD-10-CM | POA: Diagnosis not present

## 2020-08-02 DIAGNOSIS — E440 Moderate protein-calorie malnutrition: Secondary | ICD-10-CM | POA: Diagnosis not present

## 2020-08-02 DIAGNOSIS — N186 End stage renal disease: Secondary | ICD-10-CM | POA: Diagnosis not present

## 2020-08-03 DIAGNOSIS — N2589 Other disorders resulting from impaired renal tubular function: Secondary | ICD-10-CM | POA: Diagnosis not present

## 2020-08-03 DIAGNOSIS — N2581 Secondary hyperparathyroidism of renal origin: Secondary | ICD-10-CM | POA: Diagnosis not present

## 2020-08-03 DIAGNOSIS — Z4932 Encounter for adequacy testing for peritoneal dialysis: Secondary | ICD-10-CM | POA: Diagnosis not present

## 2020-08-03 DIAGNOSIS — N186 End stage renal disease: Secondary | ICD-10-CM | POA: Diagnosis not present

## 2020-08-03 DIAGNOSIS — Z992 Dependence on renal dialysis: Secondary | ICD-10-CM | POA: Diagnosis not present

## 2020-08-03 DIAGNOSIS — E440 Moderate protein-calorie malnutrition: Secondary | ICD-10-CM | POA: Diagnosis not present

## 2020-08-03 DIAGNOSIS — D631 Anemia in chronic kidney disease: Secondary | ICD-10-CM | POA: Diagnosis not present

## 2020-08-03 DIAGNOSIS — D509 Iron deficiency anemia, unspecified: Secondary | ICD-10-CM | POA: Diagnosis not present

## 2020-08-03 DIAGNOSIS — Z79899 Other long term (current) drug therapy: Secondary | ICD-10-CM | POA: Diagnosis not present

## 2020-08-04 DIAGNOSIS — I129 Hypertensive chronic kidney disease with stage 1 through stage 4 chronic kidney disease, or unspecified chronic kidney disease: Secondary | ICD-10-CM | POA: Diagnosis not present

## 2020-08-04 DIAGNOSIS — Z4932 Encounter for adequacy testing for peritoneal dialysis: Secondary | ICD-10-CM | POA: Diagnosis not present

## 2020-08-04 DIAGNOSIS — N186 End stage renal disease: Secondary | ICD-10-CM | POA: Diagnosis not present

## 2020-08-04 DIAGNOSIS — N2589 Other disorders resulting from impaired renal tubular function: Secondary | ICD-10-CM | POA: Diagnosis not present

## 2020-08-04 DIAGNOSIS — Z79899 Other long term (current) drug therapy: Secondary | ICD-10-CM | POA: Diagnosis not present

## 2020-08-04 DIAGNOSIS — D509 Iron deficiency anemia, unspecified: Secondary | ICD-10-CM | POA: Diagnosis not present

## 2020-08-04 DIAGNOSIS — D631 Anemia in chronic kidney disease: Secondary | ICD-10-CM | POA: Diagnosis not present

## 2020-08-04 DIAGNOSIS — N2581 Secondary hyperparathyroidism of renal origin: Secondary | ICD-10-CM | POA: Diagnosis not present

## 2020-08-04 DIAGNOSIS — Z992 Dependence on renal dialysis: Secondary | ICD-10-CM | POA: Diagnosis not present

## 2020-08-04 DIAGNOSIS — E440 Moderate protein-calorie malnutrition: Secondary | ICD-10-CM | POA: Diagnosis not present

## 2020-08-05 DIAGNOSIS — Z992 Dependence on renal dialysis: Secondary | ICD-10-CM | POA: Diagnosis not present

## 2020-08-05 DIAGNOSIS — D631 Anemia in chronic kidney disease: Secondary | ICD-10-CM | POA: Diagnosis not present

## 2020-08-05 DIAGNOSIS — E440 Moderate protein-calorie malnutrition: Secondary | ICD-10-CM | POA: Diagnosis not present

## 2020-08-05 DIAGNOSIS — N2581 Secondary hyperparathyroidism of renal origin: Secondary | ICD-10-CM | POA: Diagnosis not present

## 2020-08-05 DIAGNOSIS — K769 Liver disease, unspecified: Secondary | ICD-10-CM | POA: Diagnosis not present

## 2020-08-05 DIAGNOSIS — D509 Iron deficiency anemia, unspecified: Secondary | ICD-10-CM | POA: Diagnosis not present

## 2020-08-05 DIAGNOSIS — N186 End stage renal disease: Secondary | ICD-10-CM | POA: Diagnosis not present

## 2020-08-05 DIAGNOSIS — N2589 Other disorders resulting from impaired renal tubular function: Secondary | ICD-10-CM | POA: Diagnosis not present

## 2020-08-05 DIAGNOSIS — E878 Other disorders of electrolyte and fluid balance, not elsewhere classified: Secondary | ICD-10-CM | POA: Diagnosis not present

## 2020-08-05 DIAGNOSIS — Z79899 Other long term (current) drug therapy: Secondary | ICD-10-CM | POA: Diagnosis not present

## 2020-08-05 DIAGNOSIS — E7841 Elevated Lipoprotein(a): Secondary | ICD-10-CM | POA: Diagnosis not present

## 2020-08-06 DIAGNOSIS — K769 Liver disease, unspecified: Secondary | ICD-10-CM | POA: Diagnosis not present

## 2020-08-06 DIAGNOSIS — E440 Moderate protein-calorie malnutrition: Secondary | ICD-10-CM | POA: Diagnosis not present

## 2020-08-06 DIAGNOSIS — E878 Other disorders of electrolyte and fluid balance, not elsewhere classified: Secondary | ICD-10-CM | POA: Diagnosis not present

## 2020-08-06 DIAGNOSIS — E7841 Elevated Lipoprotein(a): Secondary | ICD-10-CM | POA: Diagnosis not present

## 2020-08-06 DIAGNOSIS — N2581 Secondary hyperparathyroidism of renal origin: Secondary | ICD-10-CM | POA: Diagnosis not present

## 2020-08-06 DIAGNOSIS — N2589 Other disorders resulting from impaired renal tubular function: Secondary | ICD-10-CM | POA: Diagnosis not present

## 2020-08-06 DIAGNOSIS — Z79899 Other long term (current) drug therapy: Secondary | ICD-10-CM | POA: Diagnosis not present

## 2020-08-06 DIAGNOSIS — Z992 Dependence on renal dialysis: Secondary | ICD-10-CM | POA: Diagnosis not present

## 2020-08-06 DIAGNOSIS — N186 End stage renal disease: Secondary | ICD-10-CM | POA: Diagnosis not present

## 2020-08-06 DIAGNOSIS — D509 Iron deficiency anemia, unspecified: Secondary | ICD-10-CM | POA: Diagnosis not present

## 2020-08-06 DIAGNOSIS — D631 Anemia in chronic kidney disease: Secondary | ICD-10-CM | POA: Diagnosis not present

## 2020-08-07 DIAGNOSIS — Z992 Dependence on renal dialysis: Secondary | ICD-10-CM | POA: Diagnosis not present

## 2020-08-07 DIAGNOSIS — E440 Moderate protein-calorie malnutrition: Secondary | ICD-10-CM | POA: Diagnosis not present

## 2020-08-07 DIAGNOSIS — D631 Anemia in chronic kidney disease: Secondary | ICD-10-CM | POA: Diagnosis not present

## 2020-08-07 DIAGNOSIS — Z79899 Other long term (current) drug therapy: Secondary | ICD-10-CM | POA: Diagnosis not present

## 2020-08-07 DIAGNOSIS — N186 End stage renal disease: Secondary | ICD-10-CM | POA: Diagnosis not present

## 2020-08-07 DIAGNOSIS — N2581 Secondary hyperparathyroidism of renal origin: Secondary | ICD-10-CM | POA: Diagnosis not present

## 2020-08-07 DIAGNOSIS — N2589 Other disorders resulting from impaired renal tubular function: Secondary | ICD-10-CM | POA: Diagnosis not present

## 2020-08-07 DIAGNOSIS — E878 Other disorders of electrolyte and fluid balance, not elsewhere classified: Secondary | ICD-10-CM | POA: Diagnosis not present

## 2020-08-07 DIAGNOSIS — K769 Liver disease, unspecified: Secondary | ICD-10-CM | POA: Diagnosis not present

## 2020-08-07 DIAGNOSIS — D509 Iron deficiency anemia, unspecified: Secondary | ICD-10-CM | POA: Diagnosis not present

## 2020-08-07 DIAGNOSIS — E7841 Elevated Lipoprotein(a): Secondary | ICD-10-CM | POA: Diagnosis not present

## 2020-08-08 DIAGNOSIS — K769 Liver disease, unspecified: Secondary | ICD-10-CM | POA: Diagnosis not present

## 2020-08-08 DIAGNOSIS — Z79899 Other long term (current) drug therapy: Secondary | ICD-10-CM | POA: Diagnosis not present

## 2020-08-08 DIAGNOSIS — N186 End stage renal disease: Secondary | ICD-10-CM | POA: Diagnosis not present

## 2020-08-08 DIAGNOSIS — Z992 Dependence on renal dialysis: Secondary | ICD-10-CM | POA: Diagnosis not present

## 2020-08-08 DIAGNOSIS — N2581 Secondary hyperparathyroidism of renal origin: Secondary | ICD-10-CM | POA: Diagnosis not present

## 2020-08-08 DIAGNOSIS — E440 Moderate protein-calorie malnutrition: Secondary | ICD-10-CM | POA: Diagnosis not present

## 2020-08-08 DIAGNOSIS — N2589 Other disorders resulting from impaired renal tubular function: Secondary | ICD-10-CM | POA: Diagnosis not present

## 2020-08-08 DIAGNOSIS — D509 Iron deficiency anemia, unspecified: Secondary | ICD-10-CM | POA: Diagnosis not present

## 2020-08-08 DIAGNOSIS — D631 Anemia in chronic kidney disease: Secondary | ICD-10-CM | POA: Diagnosis not present

## 2020-08-08 DIAGNOSIS — E7841 Elevated Lipoprotein(a): Secondary | ICD-10-CM | POA: Diagnosis not present

## 2020-08-08 DIAGNOSIS — E878 Other disorders of electrolyte and fluid balance, not elsewhere classified: Secondary | ICD-10-CM | POA: Diagnosis not present

## 2020-08-09 DIAGNOSIS — E878 Other disorders of electrolyte and fluid balance, not elsewhere classified: Secondary | ICD-10-CM | POA: Diagnosis not present

## 2020-08-09 DIAGNOSIS — E7841 Elevated Lipoprotein(a): Secondary | ICD-10-CM | POA: Diagnosis not present

## 2020-08-09 DIAGNOSIS — Z992 Dependence on renal dialysis: Secondary | ICD-10-CM | POA: Diagnosis not present

## 2020-08-09 DIAGNOSIS — D509 Iron deficiency anemia, unspecified: Secondary | ICD-10-CM | POA: Diagnosis not present

## 2020-08-09 DIAGNOSIS — K769 Liver disease, unspecified: Secondary | ICD-10-CM | POA: Diagnosis not present

## 2020-08-09 DIAGNOSIS — E440 Moderate protein-calorie malnutrition: Secondary | ICD-10-CM | POA: Diagnosis not present

## 2020-08-09 DIAGNOSIS — N186 End stage renal disease: Secondary | ICD-10-CM | POA: Diagnosis not present

## 2020-08-09 DIAGNOSIS — D631 Anemia in chronic kidney disease: Secondary | ICD-10-CM | POA: Diagnosis not present

## 2020-08-09 DIAGNOSIS — Z79899 Other long term (current) drug therapy: Secondary | ICD-10-CM | POA: Diagnosis not present

## 2020-08-09 DIAGNOSIS — N2589 Other disorders resulting from impaired renal tubular function: Secondary | ICD-10-CM | POA: Diagnosis not present

## 2020-08-09 DIAGNOSIS — N2581 Secondary hyperparathyroidism of renal origin: Secondary | ICD-10-CM | POA: Diagnosis not present

## 2020-08-10 DIAGNOSIS — E440 Moderate protein-calorie malnutrition: Secondary | ICD-10-CM | POA: Diagnosis not present

## 2020-08-10 DIAGNOSIS — N2589 Other disorders resulting from impaired renal tubular function: Secondary | ICD-10-CM | POA: Diagnosis not present

## 2020-08-10 DIAGNOSIS — K769 Liver disease, unspecified: Secondary | ICD-10-CM | POA: Diagnosis not present

## 2020-08-10 DIAGNOSIS — E878 Other disorders of electrolyte and fluid balance, not elsewhere classified: Secondary | ICD-10-CM | POA: Diagnosis not present

## 2020-08-10 DIAGNOSIS — N186 End stage renal disease: Secondary | ICD-10-CM | POA: Diagnosis not present

## 2020-08-10 DIAGNOSIS — Z992 Dependence on renal dialysis: Secondary | ICD-10-CM | POA: Diagnosis not present

## 2020-08-10 DIAGNOSIS — D509 Iron deficiency anemia, unspecified: Secondary | ICD-10-CM | POA: Diagnosis not present

## 2020-08-10 DIAGNOSIS — E7841 Elevated Lipoprotein(a): Secondary | ICD-10-CM | POA: Diagnosis not present

## 2020-08-10 DIAGNOSIS — D631 Anemia in chronic kidney disease: Secondary | ICD-10-CM | POA: Diagnosis not present

## 2020-08-10 DIAGNOSIS — N2581 Secondary hyperparathyroidism of renal origin: Secondary | ICD-10-CM | POA: Diagnosis not present

## 2020-08-10 DIAGNOSIS — Z79899 Other long term (current) drug therapy: Secondary | ICD-10-CM | POA: Diagnosis not present

## 2020-08-11 DIAGNOSIS — E440 Moderate protein-calorie malnutrition: Secondary | ICD-10-CM | POA: Diagnosis not present

## 2020-08-11 DIAGNOSIS — N2589 Other disorders resulting from impaired renal tubular function: Secondary | ICD-10-CM | POA: Diagnosis not present

## 2020-08-11 DIAGNOSIS — N2581 Secondary hyperparathyroidism of renal origin: Secondary | ICD-10-CM | POA: Diagnosis not present

## 2020-08-11 DIAGNOSIS — E7841 Elevated Lipoprotein(a): Secondary | ICD-10-CM | POA: Diagnosis not present

## 2020-08-11 DIAGNOSIS — K769 Liver disease, unspecified: Secondary | ICD-10-CM | POA: Diagnosis not present

## 2020-08-11 DIAGNOSIS — D631 Anemia in chronic kidney disease: Secondary | ICD-10-CM | POA: Diagnosis not present

## 2020-08-11 DIAGNOSIS — D509 Iron deficiency anemia, unspecified: Secondary | ICD-10-CM | POA: Diagnosis not present

## 2020-08-11 DIAGNOSIS — Z992 Dependence on renal dialysis: Secondary | ICD-10-CM | POA: Diagnosis not present

## 2020-08-11 DIAGNOSIS — E878 Other disorders of electrolyte and fluid balance, not elsewhere classified: Secondary | ICD-10-CM | POA: Diagnosis not present

## 2020-08-11 DIAGNOSIS — Z79899 Other long term (current) drug therapy: Secondary | ICD-10-CM | POA: Diagnosis not present

## 2020-08-11 DIAGNOSIS — N186 End stage renal disease: Secondary | ICD-10-CM | POA: Diagnosis not present

## 2020-08-12 DIAGNOSIS — E7841 Elevated Lipoprotein(a): Secondary | ICD-10-CM | POA: Diagnosis not present

## 2020-08-12 DIAGNOSIS — D509 Iron deficiency anemia, unspecified: Secondary | ICD-10-CM | POA: Diagnosis not present

## 2020-08-12 DIAGNOSIS — E440 Moderate protein-calorie malnutrition: Secondary | ICD-10-CM | POA: Diagnosis not present

## 2020-08-12 DIAGNOSIS — Z992 Dependence on renal dialysis: Secondary | ICD-10-CM | POA: Diagnosis not present

## 2020-08-12 DIAGNOSIS — E878 Other disorders of electrolyte and fluid balance, not elsewhere classified: Secondary | ICD-10-CM | POA: Diagnosis not present

## 2020-08-12 DIAGNOSIS — Z79899 Other long term (current) drug therapy: Secondary | ICD-10-CM | POA: Diagnosis not present

## 2020-08-12 DIAGNOSIS — K769 Liver disease, unspecified: Secondary | ICD-10-CM | POA: Diagnosis not present

## 2020-08-12 DIAGNOSIS — D631 Anemia in chronic kidney disease: Secondary | ICD-10-CM | POA: Diagnosis not present

## 2020-08-12 DIAGNOSIS — N2581 Secondary hyperparathyroidism of renal origin: Secondary | ICD-10-CM | POA: Diagnosis not present

## 2020-08-12 DIAGNOSIS — N186 End stage renal disease: Secondary | ICD-10-CM | POA: Diagnosis not present

## 2020-08-12 DIAGNOSIS — N2589 Other disorders resulting from impaired renal tubular function: Secondary | ICD-10-CM | POA: Diagnosis not present

## 2020-08-13 DIAGNOSIS — N2589 Other disorders resulting from impaired renal tubular function: Secondary | ICD-10-CM | POA: Diagnosis not present

## 2020-08-13 DIAGNOSIS — E7841 Elevated Lipoprotein(a): Secondary | ICD-10-CM | POA: Diagnosis not present

## 2020-08-13 DIAGNOSIS — Z992 Dependence on renal dialysis: Secondary | ICD-10-CM | POA: Diagnosis not present

## 2020-08-13 DIAGNOSIS — N2581 Secondary hyperparathyroidism of renal origin: Secondary | ICD-10-CM | POA: Diagnosis not present

## 2020-08-13 DIAGNOSIS — Z79899 Other long term (current) drug therapy: Secondary | ICD-10-CM | POA: Diagnosis not present

## 2020-08-13 DIAGNOSIS — D631 Anemia in chronic kidney disease: Secondary | ICD-10-CM | POA: Diagnosis not present

## 2020-08-13 DIAGNOSIS — E440 Moderate protein-calorie malnutrition: Secondary | ICD-10-CM | POA: Diagnosis not present

## 2020-08-13 DIAGNOSIS — K769 Liver disease, unspecified: Secondary | ICD-10-CM | POA: Diagnosis not present

## 2020-08-13 DIAGNOSIS — D509 Iron deficiency anemia, unspecified: Secondary | ICD-10-CM | POA: Diagnosis not present

## 2020-08-13 DIAGNOSIS — E878 Other disorders of electrolyte and fluid balance, not elsewhere classified: Secondary | ICD-10-CM | POA: Diagnosis not present

## 2020-08-13 DIAGNOSIS — N186 End stage renal disease: Secondary | ICD-10-CM | POA: Diagnosis not present

## 2020-08-14 DIAGNOSIS — N186 End stage renal disease: Secondary | ICD-10-CM | POA: Diagnosis not present

## 2020-08-14 DIAGNOSIS — N2581 Secondary hyperparathyroidism of renal origin: Secondary | ICD-10-CM | POA: Diagnosis not present

## 2020-08-14 DIAGNOSIS — E440 Moderate protein-calorie malnutrition: Secondary | ICD-10-CM | POA: Diagnosis not present

## 2020-08-14 DIAGNOSIS — D509 Iron deficiency anemia, unspecified: Secondary | ICD-10-CM | POA: Diagnosis not present

## 2020-08-14 DIAGNOSIS — Z79899 Other long term (current) drug therapy: Secondary | ICD-10-CM | POA: Diagnosis not present

## 2020-08-14 DIAGNOSIS — N2589 Other disorders resulting from impaired renal tubular function: Secondary | ICD-10-CM | POA: Diagnosis not present

## 2020-08-14 DIAGNOSIS — D631 Anemia in chronic kidney disease: Secondary | ICD-10-CM | POA: Diagnosis not present

## 2020-08-14 DIAGNOSIS — Z992 Dependence on renal dialysis: Secondary | ICD-10-CM | POA: Diagnosis not present

## 2020-08-14 DIAGNOSIS — E7841 Elevated Lipoprotein(a): Secondary | ICD-10-CM | POA: Diagnosis not present

## 2020-08-14 DIAGNOSIS — K769 Liver disease, unspecified: Secondary | ICD-10-CM | POA: Diagnosis not present

## 2020-08-14 DIAGNOSIS — E878 Other disorders of electrolyte and fluid balance, not elsewhere classified: Secondary | ICD-10-CM | POA: Diagnosis not present

## 2020-08-15 DIAGNOSIS — Z992 Dependence on renal dialysis: Secondary | ICD-10-CM | POA: Diagnosis not present

## 2020-08-15 DIAGNOSIS — N186 End stage renal disease: Secondary | ICD-10-CM | POA: Diagnosis not present

## 2020-08-15 DIAGNOSIS — E7841 Elevated Lipoprotein(a): Secondary | ICD-10-CM | POA: Diagnosis not present

## 2020-08-15 DIAGNOSIS — E440 Moderate protein-calorie malnutrition: Secondary | ICD-10-CM | POA: Diagnosis not present

## 2020-08-15 DIAGNOSIS — N2589 Other disorders resulting from impaired renal tubular function: Secondary | ICD-10-CM | POA: Diagnosis not present

## 2020-08-15 DIAGNOSIS — D509 Iron deficiency anemia, unspecified: Secondary | ICD-10-CM | POA: Diagnosis not present

## 2020-08-15 DIAGNOSIS — K769 Liver disease, unspecified: Secondary | ICD-10-CM | POA: Diagnosis not present

## 2020-08-15 DIAGNOSIS — E878 Other disorders of electrolyte and fluid balance, not elsewhere classified: Secondary | ICD-10-CM | POA: Diagnosis not present

## 2020-08-15 DIAGNOSIS — D631 Anemia in chronic kidney disease: Secondary | ICD-10-CM | POA: Diagnosis not present

## 2020-08-15 DIAGNOSIS — N2581 Secondary hyperparathyroidism of renal origin: Secondary | ICD-10-CM | POA: Diagnosis not present

## 2020-08-15 DIAGNOSIS — Z79899 Other long term (current) drug therapy: Secondary | ICD-10-CM | POA: Diagnosis not present

## 2020-08-16 DIAGNOSIS — D509 Iron deficiency anemia, unspecified: Secondary | ICD-10-CM | POA: Diagnosis not present

## 2020-08-16 DIAGNOSIS — E878 Other disorders of electrolyte and fluid balance, not elsewhere classified: Secondary | ICD-10-CM | POA: Diagnosis not present

## 2020-08-16 DIAGNOSIS — N2589 Other disorders resulting from impaired renal tubular function: Secondary | ICD-10-CM | POA: Diagnosis not present

## 2020-08-16 DIAGNOSIS — Z79899 Other long term (current) drug therapy: Secondary | ICD-10-CM | POA: Diagnosis not present

## 2020-08-16 DIAGNOSIS — D631 Anemia in chronic kidney disease: Secondary | ICD-10-CM | POA: Diagnosis not present

## 2020-08-16 DIAGNOSIS — Z992 Dependence on renal dialysis: Secondary | ICD-10-CM | POA: Diagnosis not present

## 2020-08-16 DIAGNOSIS — N186 End stage renal disease: Secondary | ICD-10-CM | POA: Diagnosis not present

## 2020-08-16 DIAGNOSIS — K769 Liver disease, unspecified: Secondary | ICD-10-CM | POA: Diagnosis not present

## 2020-08-16 DIAGNOSIS — E7841 Elevated Lipoprotein(a): Secondary | ICD-10-CM | POA: Diagnosis not present

## 2020-08-16 DIAGNOSIS — N2581 Secondary hyperparathyroidism of renal origin: Secondary | ICD-10-CM | POA: Diagnosis not present

## 2020-08-16 DIAGNOSIS — E440 Moderate protein-calorie malnutrition: Secondary | ICD-10-CM | POA: Diagnosis not present

## 2020-08-17 DIAGNOSIS — Z79899 Other long term (current) drug therapy: Secondary | ICD-10-CM | POA: Diagnosis not present

## 2020-08-17 DIAGNOSIS — D631 Anemia in chronic kidney disease: Secondary | ICD-10-CM | POA: Diagnosis not present

## 2020-08-17 DIAGNOSIS — E878 Other disorders of electrolyte and fluid balance, not elsewhere classified: Secondary | ICD-10-CM | POA: Diagnosis not present

## 2020-08-17 DIAGNOSIS — D509 Iron deficiency anemia, unspecified: Secondary | ICD-10-CM | POA: Diagnosis not present

## 2020-08-17 DIAGNOSIS — E440 Moderate protein-calorie malnutrition: Secondary | ICD-10-CM | POA: Diagnosis not present

## 2020-08-17 DIAGNOSIS — K769 Liver disease, unspecified: Secondary | ICD-10-CM | POA: Diagnosis not present

## 2020-08-17 DIAGNOSIS — E7841 Elevated Lipoprotein(a): Secondary | ICD-10-CM | POA: Diagnosis not present

## 2020-08-17 DIAGNOSIS — N2589 Other disorders resulting from impaired renal tubular function: Secondary | ICD-10-CM | POA: Diagnosis not present

## 2020-08-17 DIAGNOSIS — N2581 Secondary hyperparathyroidism of renal origin: Secondary | ICD-10-CM | POA: Diagnosis not present

## 2020-08-17 DIAGNOSIS — Z992 Dependence on renal dialysis: Secondary | ICD-10-CM | POA: Diagnosis not present

## 2020-08-17 DIAGNOSIS — N186 End stage renal disease: Secondary | ICD-10-CM | POA: Diagnosis not present

## 2020-08-18 DIAGNOSIS — D631 Anemia in chronic kidney disease: Secondary | ICD-10-CM | POA: Diagnosis not present

## 2020-08-18 DIAGNOSIS — E7841 Elevated Lipoprotein(a): Secondary | ICD-10-CM | POA: Diagnosis not present

## 2020-08-18 DIAGNOSIS — N2581 Secondary hyperparathyroidism of renal origin: Secondary | ICD-10-CM | POA: Diagnosis not present

## 2020-08-18 DIAGNOSIS — E440 Moderate protein-calorie malnutrition: Secondary | ICD-10-CM | POA: Diagnosis not present

## 2020-08-18 DIAGNOSIS — Z992 Dependence on renal dialysis: Secondary | ICD-10-CM | POA: Diagnosis not present

## 2020-08-18 DIAGNOSIS — K769 Liver disease, unspecified: Secondary | ICD-10-CM | POA: Diagnosis not present

## 2020-08-18 DIAGNOSIS — Z79899 Other long term (current) drug therapy: Secondary | ICD-10-CM | POA: Diagnosis not present

## 2020-08-18 DIAGNOSIS — N2589 Other disorders resulting from impaired renal tubular function: Secondary | ICD-10-CM | POA: Diagnosis not present

## 2020-08-18 DIAGNOSIS — N186 End stage renal disease: Secondary | ICD-10-CM | POA: Diagnosis not present

## 2020-08-18 DIAGNOSIS — E878 Other disorders of electrolyte and fluid balance, not elsewhere classified: Secondary | ICD-10-CM | POA: Diagnosis not present

## 2020-08-18 DIAGNOSIS — D509 Iron deficiency anemia, unspecified: Secondary | ICD-10-CM | POA: Diagnosis not present

## 2020-08-19 DIAGNOSIS — Z79899 Other long term (current) drug therapy: Secondary | ICD-10-CM | POA: Diagnosis not present

## 2020-08-19 DIAGNOSIS — E878 Other disorders of electrolyte and fluid balance, not elsewhere classified: Secondary | ICD-10-CM | POA: Diagnosis not present

## 2020-08-19 DIAGNOSIS — N2589 Other disorders resulting from impaired renal tubular function: Secondary | ICD-10-CM | POA: Diagnosis not present

## 2020-08-19 DIAGNOSIS — K769 Liver disease, unspecified: Secondary | ICD-10-CM | POA: Diagnosis not present

## 2020-08-19 DIAGNOSIS — E7841 Elevated Lipoprotein(a): Secondary | ICD-10-CM | POA: Diagnosis not present

## 2020-08-19 DIAGNOSIS — E440 Moderate protein-calorie malnutrition: Secondary | ICD-10-CM | POA: Diagnosis not present

## 2020-08-19 DIAGNOSIS — D509 Iron deficiency anemia, unspecified: Secondary | ICD-10-CM | POA: Diagnosis not present

## 2020-08-19 DIAGNOSIS — N186 End stage renal disease: Secondary | ICD-10-CM | POA: Diagnosis not present

## 2020-08-19 DIAGNOSIS — Z992 Dependence on renal dialysis: Secondary | ICD-10-CM | POA: Diagnosis not present

## 2020-08-19 DIAGNOSIS — D631 Anemia in chronic kidney disease: Secondary | ICD-10-CM | POA: Diagnosis not present

## 2020-08-19 DIAGNOSIS — N2581 Secondary hyperparathyroidism of renal origin: Secondary | ICD-10-CM | POA: Diagnosis not present

## 2020-08-20 DIAGNOSIS — K769 Liver disease, unspecified: Secondary | ICD-10-CM | POA: Diagnosis not present

## 2020-08-20 DIAGNOSIS — D509 Iron deficiency anemia, unspecified: Secondary | ICD-10-CM | POA: Diagnosis not present

## 2020-08-20 DIAGNOSIS — D631 Anemia in chronic kidney disease: Secondary | ICD-10-CM | POA: Diagnosis not present

## 2020-08-20 DIAGNOSIS — E440 Moderate protein-calorie malnutrition: Secondary | ICD-10-CM | POA: Diagnosis not present

## 2020-08-20 DIAGNOSIS — N2589 Other disorders resulting from impaired renal tubular function: Secondary | ICD-10-CM | POA: Diagnosis not present

## 2020-08-20 DIAGNOSIS — E7841 Elevated Lipoprotein(a): Secondary | ICD-10-CM | POA: Diagnosis not present

## 2020-08-20 DIAGNOSIS — N186 End stage renal disease: Secondary | ICD-10-CM | POA: Diagnosis not present

## 2020-08-20 DIAGNOSIS — Z992 Dependence on renal dialysis: Secondary | ICD-10-CM | POA: Diagnosis not present

## 2020-08-20 DIAGNOSIS — Z79899 Other long term (current) drug therapy: Secondary | ICD-10-CM | POA: Diagnosis not present

## 2020-08-20 DIAGNOSIS — E878 Other disorders of electrolyte and fluid balance, not elsewhere classified: Secondary | ICD-10-CM | POA: Diagnosis not present

## 2020-08-20 DIAGNOSIS — N2581 Secondary hyperparathyroidism of renal origin: Secondary | ICD-10-CM | POA: Diagnosis not present

## 2020-08-21 DIAGNOSIS — D509 Iron deficiency anemia, unspecified: Secondary | ICD-10-CM | POA: Diagnosis not present

## 2020-08-21 DIAGNOSIS — D631 Anemia in chronic kidney disease: Secondary | ICD-10-CM | POA: Diagnosis not present

## 2020-08-21 DIAGNOSIS — E440 Moderate protein-calorie malnutrition: Secondary | ICD-10-CM | POA: Diagnosis not present

## 2020-08-21 DIAGNOSIS — E7841 Elevated Lipoprotein(a): Secondary | ICD-10-CM | POA: Diagnosis not present

## 2020-08-21 DIAGNOSIS — Z79899 Other long term (current) drug therapy: Secondary | ICD-10-CM | POA: Diagnosis not present

## 2020-08-21 DIAGNOSIS — K769 Liver disease, unspecified: Secondary | ICD-10-CM | POA: Diagnosis not present

## 2020-08-21 DIAGNOSIS — Z992 Dependence on renal dialysis: Secondary | ICD-10-CM | POA: Diagnosis not present

## 2020-08-21 DIAGNOSIS — N186 End stage renal disease: Secondary | ICD-10-CM | POA: Diagnosis not present

## 2020-08-21 DIAGNOSIS — E878 Other disorders of electrolyte and fluid balance, not elsewhere classified: Secondary | ICD-10-CM | POA: Diagnosis not present

## 2020-08-21 DIAGNOSIS — N2581 Secondary hyperparathyroidism of renal origin: Secondary | ICD-10-CM | POA: Diagnosis not present

## 2020-08-21 DIAGNOSIS — N2589 Other disorders resulting from impaired renal tubular function: Secondary | ICD-10-CM | POA: Diagnosis not present

## 2020-08-22 DIAGNOSIS — N2581 Secondary hyperparathyroidism of renal origin: Secondary | ICD-10-CM | POA: Diagnosis not present

## 2020-08-22 DIAGNOSIS — Z79899 Other long term (current) drug therapy: Secondary | ICD-10-CM | POA: Diagnosis not present

## 2020-08-22 DIAGNOSIS — D631 Anemia in chronic kidney disease: Secondary | ICD-10-CM | POA: Diagnosis not present

## 2020-08-22 DIAGNOSIS — N186 End stage renal disease: Secondary | ICD-10-CM | POA: Diagnosis not present

## 2020-08-22 DIAGNOSIS — D509 Iron deficiency anemia, unspecified: Secondary | ICD-10-CM | POA: Diagnosis not present

## 2020-08-22 DIAGNOSIS — E878 Other disorders of electrolyte and fluid balance, not elsewhere classified: Secondary | ICD-10-CM | POA: Diagnosis not present

## 2020-08-22 DIAGNOSIS — K769 Liver disease, unspecified: Secondary | ICD-10-CM | POA: Diagnosis not present

## 2020-08-22 DIAGNOSIS — E440 Moderate protein-calorie malnutrition: Secondary | ICD-10-CM | POA: Diagnosis not present

## 2020-08-22 DIAGNOSIS — N2589 Other disorders resulting from impaired renal tubular function: Secondary | ICD-10-CM | POA: Diagnosis not present

## 2020-08-22 DIAGNOSIS — E7841 Elevated Lipoprotein(a): Secondary | ICD-10-CM | POA: Diagnosis not present

## 2020-08-22 DIAGNOSIS — Z992 Dependence on renal dialysis: Secondary | ICD-10-CM | POA: Diagnosis not present

## 2020-08-23 DIAGNOSIS — E440 Moderate protein-calorie malnutrition: Secondary | ICD-10-CM | POA: Diagnosis not present

## 2020-08-23 DIAGNOSIS — N186 End stage renal disease: Secondary | ICD-10-CM | POA: Diagnosis not present

## 2020-08-23 DIAGNOSIS — D509 Iron deficiency anemia, unspecified: Secondary | ICD-10-CM | POA: Diagnosis not present

## 2020-08-23 DIAGNOSIS — Z992 Dependence on renal dialysis: Secondary | ICD-10-CM | POA: Diagnosis not present

## 2020-08-23 DIAGNOSIS — D631 Anemia in chronic kidney disease: Secondary | ICD-10-CM | POA: Diagnosis not present

## 2020-08-23 DIAGNOSIS — N2581 Secondary hyperparathyroidism of renal origin: Secondary | ICD-10-CM | POA: Diagnosis not present

## 2020-08-23 DIAGNOSIS — Z79899 Other long term (current) drug therapy: Secondary | ICD-10-CM | POA: Diagnosis not present

## 2020-08-23 DIAGNOSIS — E7841 Elevated Lipoprotein(a): Secondary | ICD-10-CM | POA: Diagnosis not present

## 2020-08-23 DIAGNOSIS — K769 Liver disease, unspecified: Secondary | ICD-10-CM | POA: Diagnosis not present

## 2020-08-23 DIAGNOSIS — E878 Other disorders of electrolyte and fluid balance, not elsewhere classified: Secondary | ICD-10-CM | POA: Diagnosis not present

## 2020-08-23 DIAGNOSIS — N2589 Other disorders resulting from impaired renal tubular function: Secondary | ICD-10-CM | POA: Diagnosis not present

## 2020-08-24 DIAGNOSIS — D509 Iron deficiency anemia, unspecified: Secondary | ICD-10-CM | POA: Diagnosis not present

## 2020-08-24 DIAGNOSIS — E7841 Elevated Lipoprotein(a): Secondary | ICD-10-CM | POA: Diagnosis not present

## 2020-08-24 DIAGNOSIS — Z992 Dependence on renal dialysis: Secondary | ICD-10-CM | POA: Diagnosis not present

## 2020-08-24 DIAGNOSIS — K769 Liver disease, unspecified: Secondary | ICD-10-CM | POA: Diagnosis not present

## 2020-08-24 DIAGNOSIS — N2581 Secondary hyperparathyroidism of renal origin: Secondary | ICD-10-CM | POA: Diagnosis not present

## 2020-08-24 DIAGNOSIS — E878 Other disorders of electrolyte and fluid balance, not elsewhere classified: Secondary | ICD-10-CM | POA: Diagnosis not present

## 2020-08-24 DIAGNOSIS — N186 End stage renal disease: Secondary | ICD-10-CM | POA: Diagnosis not present

## 2020-08-24 DIAGNOSIS — D631 Anemia in chronic kidney disease: Secondary | ICD-10-CM | POA: Diagnosis not present

## 2020-08-24 DIAGNOSIS — E440 Moderate protein-calorie malnutrition: Secondary | ICD-10-CM | POA: Diagnosis not present

## 2020-08-24 DIAGNOSIS — N2589 Other disorders resulting from impaired renal tubular function: Secondary | ICD-10-CM | POA: Diagnosis not present

## 2020-08-24 DIAGNOSIS — Z79899 Other long term (current) drug therapy: Secondary | ICD-10-CM | POA: Diagnosis not present

## 2020-08-25 DIAGNOSIS — D509 Iron deficiency anemia, unspecified: Secondary | ICD-10-CM | POA: Diagnosis not present

## 2020-08-25 DIAGNOSIS — Z992 Dependence on renal dialysis: Secondary | ICD-10-CM | POA: Diagnosis not present

## 2020-08-25 DIAGNOSIS — N2581 Secondary hyperparathyroidism of renal origin: Secondary | ICD-10-CM | POA: Diagnosis not present

## 2020-08-25 DIAGNOSIS — D631 Anemia in chronic kidney disease: Secondary | ICD-10-CM | POA: Diagnosis not present

## 2020-08-25 DIAGNOSIS — E878 Other disorders of electrolyte and fluid balance, not elsewhere classified: Secondary | ICD-10-CM | POA: Diagnosis not present

## 2020-08-25 DIAGNOSIS — N2589 Other disorders resulting from impaired renal tubular function: Secondary | ICD-10-CM | POA: Diagnosis not present

## 2020-08-25 DIAGNOSIS — N186 End stage renal disease: Secondary | ICD-10-CM | POA: Diagnosis not present

## 2020-08-25 DIAGNOSIS — Z79899 Other long term (current) drug therapy: Secondary | ICD-10-CM | POA: Diagnosis not present

## 2020-08-25 DIAGNOSIS — E440 Moderate protein-calorie malnutrition: Secondary | ICD-10-CM | POA: Diagnosis not present

## 2020-08-25 DIAGNOSIS — E7841 Elevated Lipoprotein(a): Secondary | ICD-10-CM | POA: Diagnosis not present

## 2020-08-25 DIAGNOSIS — K769 Liver disease, unspecified: Secondary | ICD-10-CM | POA: Diagnosis not present

## 2020-08-26 DIAGNOSIS — D631 Anemia in chronic kidney disease: Secondary | ICD-10-CM | POA: Diagnosis not present

## 2020-08-26 DIAGNOSIS — E7841 Elevated Lipoprotein(a): Secondary | ICD-10-CM | POA: Diagnosis not present

## 2020-08-26 DIAGNOSIS — E878 Other disorders of electrolyte and fluid balance, not elsewhere classified: Secondary | ICD-10-CM | POA: Diagnosis not present

## 2020-08-26 DIAGNOSIS — E440 Moderate protein-calorie malnutrition: Secondary | ICD-10-CM | POA: Diagnosis not present

## 2020-08-26 DIAGNOSIS — N2581 Secondary hyperparathyroidism of renal origin: Secondary | ICD-10-CM | POA: Diagnosis not present

## 2020-08-26 DIAGNOSIS — D509 Iron deficiency anemia, unspecified: Secondary | ICD-10-CM | POA: Diagnosis not present

## 2020-08-26 DIAGNOSIS — K769 Liver disease, unspecified: Secondary | ICD-10-CM | POA: Diagnosis not present

## 2020-08-26 DIAGNOSIS — Z992 Dependence on renal dialysis: Secondary | ICD-10-CM | POA: Diagnosis not present

## 2020-08-26 DIAGNOSIS — N2589 Other disorders resulting from impaired renal tubular function: Secondary | ICD-10-CM | POA: Diagnosis not present

## 2020-08-26 DIAGNOSIS — Z79899 Other long term (current) drug therapy: Secondary | ICD-10-CM | POA: Diagnosis not present

## 2020-08-26 DIAGNOSIS — N186 End stage renal disease: Secondary | ICD-10-CM | POA: Diagnosis not present

## 2020-08-27 DIAGNOSIS — N186 End stage renal disease: Secondary | ICD-10-CM | POA: Diagnosis not present

## 2020-08-27 DIAGNOSIS — D509 Iron deficiency anemia, unspecified: Secondary | ICD-10-CM | POA: Diagnosis not present

## 2020-08-27 DIAGNOSIS — N2581 Secondary hyperparathyroidism of renal origin: Secondary | ICD-10-CM | POA: Diagnosis not present

## 2020-08-27 DIAGNOSIS — E7841 Elevated Lipoprotein(a): Secondary | ICD-10-CM | POA: Diagnosis not present

## 2020-08-27 DIAGNOSIS — D631 Anemia in chronic kidney disease: Secondary | ICD-10-CM | POA: Diagnosis not present

## 2020-08-27 DIAGNOSIS — E440 Moderate protein-calorie malnutrition: Secondary | ICD-10-CM | POA: Diagnosis not present

## 2020-08-27 DIAGNOSIS — N2589 Other disorders resulting from impaired renal tubular function: Secondary | ICD-10-CM | POA: Diagnosis not present

## 2020-08-27 DIAGNOSIS — E878 Other disorders of electrolyte and fluid balance, not elsewhere classified: Secondary | ICD-10-CM | POA: Diagnosis not present

## 2020-08-27 DIAGNOSIS — K769 Liver disease, unspecified: Secondary | ICD-10-CM | POA: Diagnosis not present

## 2020-08-27 DIAGNOSIS — Z992 Dependence on renal dialysis: Secondary | ICD-10-CM | POA: Diagnosis not present

## 2020-08-27 DIAGNOSIS — Z79899 Other long term (current) drug therapy: Secondary | ICD-10-CM | POA: Diagnosis not present

## 2020-08-28 DIAGNOSIS — E7841 Elevated Lipoprotein(a): Secondary | ICD-10-CM | POA: Diagnosis not present

## 2020-08-28 DIAGNOSIS — E440 Moderate protein-calorie malnutrition: Secondary | ICD-10-CM | POA: Diagnosis not present

## 2020-08-28 DIAGNOSIS — D631 Anemia in chronic kidney disease: Secondary | ICD-10-CM | POA: Diagnosis not present

## 2020-08-28 DIAGNOSIS — Z79899 Other long term (current) drug therapy: Secondary | ICD-10-CM | POA: Diagnosis not present

## 2020-08-28 DIAGNOSIS — D509 Iron deficiency anemia, unspecified: Secondary | ICD-10-CM | POA: Diagnosis not present

## 2020-08-28 DIAGNOSIS — N186 End stage renal disease: Secondary | ICD-10-CM | POA: Diagnosis not present

## 2020-08-28 DIAGNOSIS — N2589 Other disorders resulting from impaired renal tubular function: Secondary | ICD-10-CM | POA: Diagnosis not present

## 2020-08-28 DIAGNOSIS — Z992 Dependence on renal dialysis: Secondary | ICD-10-CM | POA: Diagnosis not present

## 2020-08-28 DIAGNOSIS — N2581 Secondary hyperparathyroidism of renal origin: Secondary | ICD-10-CM | POA: Diagnosis not present

## 2020-08-28 DIAGNOSIS — E878 Other disorders of electrolyte and fluid balance, not elsewhere classified: Secondary | ICD-10-CM | POA: Diagnosis not present

## 2020-08-28 DIAGNOSIS — K769 Liver disease, unspecified: Secondary | ICD-10-CM | POA: Diagnosis not present

## 2020-08-29 DIAGNOSIS — N2589 Other disorders resulting from impaired renal tubular function: Secondary | ICD-10-CM | POA: Diagnosis not present

## 2020-08-29 DIAGNOSIS — N2581 Secondary hyperparathyroidism of renal origin: Secondary | ICD-10-CM | POA: Diagnosis not present

## 2020-08-29 DIAGNOSIS — E878 Other disorders of electrolyte and fluid balance, not elsewhere classified: Secondary | ICD-10-CM | POA: Diagnosis not present

## 2020-08-29 DIAGNOSIS — D509 Iron deficiency anemia, unspecified: Secondary | ICD-10-CM | POA: Diagnosis not present

## 2020-08-29 DIAGNOSIS — N186 End stage renal disease: Secondary | ICD-10-CM | POA: Diagnosis not present

## 2020-08-29 DIAGNOSIS — K769 Liver disease, unspecified: Secondary | ICD-10-CM | POA: Diagnosis not present

## 2020-08-29 DIAGNOSIS — Z79899 Other long term (current) drug therapy: Secondary | ICD-10-CM | POA: Diagnosis not present

## 2020-08-29 DIAGNOSIS — Z992 Dependence on renal dialysis: Secondary | ICD-10-CM | POA: Diagnosis not present

## 2020-08-29 DIAGNOSIS — E440 Moderate protein-calorie malnutrition: Secondary | ICD-10-CM | POA: Diagnosis not present

## 2020-08-29 DIAGNOSIS — D631 Anemia in chronic kidney disease: Secondary | ICD-10-CM | POA: Diagnosis not present

## 2020-08-29 DIAGNOSIS — E7841 Elevated Lipoprotein(a): Secondary | ICD-10-CM | POA: Diagnosis not present

## 2020-08-30 DIAGNOSIS — E7841 Elevated Lipoprotein(a): Secondary | ICD-10-CM | POA: Diagnosis not present

## 2020-08-30 DIAGNOSIS — N2589 Other disorders resulting from impaired renal tubular function: Secondary | ICD-10-CM | POA: Diagnosis not present

## 2020-08-30 DIAGNOSIS — Z79899 Other long term (current) drug therapy: Secondary | ICD-10-CM | POA: Diagnosis not present

## 2020-08-30 DIAGNOSIS — E878 Other disorders of electrolyte and fluid balance, not elsewhere classified: Secondary | ICD-10-CM | POA: Diagnosis not present

## 2020-08-30 DIAGNOSIS — Z992 Dependence on renal dialysis: Secondary | ICD-10-CM | POA: Diagnosis not present

## 2020-08-30 DIAGNOSIS — D509 Iron deficiency anemia, unspecified: Secondary | ICD-10-CM | POA: Diagnosis not present

## 2020-08-30 DIAGNOSIS — N186 End stage renal disease: Secondary | ICD-10-CM | POA: Diagnosis not present

## 2020-08-30 DIAGNOSIS — K769 Liver disease, unspecified: Secondary | ICD-10-CM | POA: Diagnosis not present

## 2020-08-30 DIAGNOSIS — E440 Moderate protein-calorie malnutrition: Secondary | ICD-10-CM | POA: Diagnosis not present

## 2020-08-30 DIAGNOSIS — N2581 Secondary hyperparathyroidism of renal origin: Secondary | ICD-10-CM | POA: Diagnosis not present

## 2020-08-30 DIAGNOSIS — D631 Anemia in chronic kidney disease: Secondary | ICD-10-CM | POA: Diagnosis not present

## 2020-08-31 DIAGNOSIS — E7841 Elevated Lipoprotein(a): Secondary | ICD-10-CM | POA: Diagnosis not present

## 2020-08-31 DIAGNOSIS — N2581 Secondary hyperparathyroidism of renal origin: Secondary | ICD-10-CM | POA: Diagnosis not present

## 2020-08-31 DIAGNOSIS — E878 Other disorders of electrolyte and fluid balance, not elsewhere classified: Secondary | ICD-10-CM | POA: Diagnosis not present

## 2020-08-31 DIAGNOSIS — N186 End stage renal disease: Secondary | ICD-10-CM | POA: Diagnosis not present

## 2020-08-31 DIAGNOSIS — D509 Iron deficiency anemia, unspecified: Secondary | ICD-10-CM | POA: Diagnosis not present

## 2020-08-31 DIAGNOSIS — K769 Liver disease, unspecified: Secondary | ICD-10-CM | POA: Diagnosis not present

## 2020-08-31 DIAGNOSIS — Z79899 Other long term (current) drug therapy: Secondary | ICD-10-CM | POA: Diagnosis not present

## 2020-08-31 DIAGNOSIS — Z992 Dependence on renal dialysis: Secondary | ICD-10-CM | POA: Diagnosis not present

## 2020-08-31 DIAGNOSIS — E440 Moderate protein-calorie malnutrition: Secondary | ICD-10-CM | POA: Diagnosis not present

## 2020-08-31 DIAGNOSIS — N2589 Other disorders resulting from impaired renal tubular function: Secondary | ICD-10-CM | POA: Diagnosis not present

## 2020-08-31 DIAGNOSIS — D631 Anemia in chronic kidney disease: Secondary | ICD-10-CM | POA: Diagnosis not present

## 2020-09-01 DIAGNOSIS — K769 Liver disease, unspecified: Secondary | ICD-10-CM | POA: Diagnosis not present

## 2020-09-01 DIAGNOSIS — E878 Other disorders of electrolyte and fluid balance, not elsewhere classified: Secondary | ICD-10-CM | POA: Diagnosis not present

## 2020-09-01 DIAGNOSIS — Z79899 Other long term (current) drug therapy: Secondary | ICD-10-CM | POA: Diagnosis not present

## 2020-09-01 DIAGNOSIS — E7841 Elevated Lipoprotein(a): Secondary | ICD-10-CM | POA: Diagnosis not present

## 2020-09-01 DIAGNOSIS — N2589 Other disorders resulting from impaired renal tubular function: Secondary | ICD-10-CM | POA: Diagnosis not present

## 2020-09-01 DIAGNOSIS — N186 End stage renal disease: Secondary | ICD-10-CM | POA: Diagnosis not present

## 2020-09-01 DIAGNOSIS — D631 Anemia in chronic kidney disease: Secondary | ICD-10-CM | POA: Diagnosis not present

## 2020-09-01 DIAGNOSIS — E440 Moderate protein-calorie malnutrition: Secondary | ICD-10-CM | POA: Diagnosis not present

## 2020-09-01 DIAGNOSIS — D509 Iron deficiency anemia, unspecified: Secondary | ICD-10-CM | POA: Diagnosis not present

## 2020-09-01 DIAGNOSIS — N2581 Secondary hyperparathyroidism of renal origin: Secondary | ICD-10-CM | POA: Diagnosis not present

## 2020-09-01 DIAGNOSIS — Z992 Dependence on renal dialysis: Secondary | ICD-10-CM | POA: Diagnosis not present

## 2020-09-02 DIAGNOSIS — K769 Liver disease, unspecified: Secondary | ICD-10-CM | POA: Diagnosis not present

## 2020-09-02 DIAGNOSIS — D509 Iron deficiency anemia, unspecified: Secondary | ICD-10-CM | POA: Diagnosis not present

## 2020-09-02 DIAGNOSIS — N2581 Secondary hyperparathyroidism of renal origin: Secondary | ICD-10-CM | POA: Diagnosis not present

## 2020-09-02 DIAGNOSIS — N2589 Other disorders resulting from impaired renal tubular function: Secondary | ICD-10-CM | POA: Diagnosis not present

## 2020-09-02 DIAGNOSIS — N186 End stage renal disease: Secondary | ICD-10-CM | POA: Diagnosis not present

## 2020-09-02 DIAGNOSIS — E878 Other disorders of electrolyte and fluid balance, not elsewhere classified: Secondary | ICD-10-CM | POA: Diagnosis not present

## 2020-09-02 DIAGNOSIS — Z79899 Other long term (current) drug therapy: Secondary | ICD-10-CM | POA: Diagnosis not present

## 2020-09-02 DIAGNOSIS — E440 Moderate protein-calorie malnutrition: Secondary | ICD-10-CM | POA: Diagnosis not present

## 2020-09-02 DIAGNOSIS — Z992 Dependence on renal dialysis: Secondary | ICD-10-CM | POA: Diagnosis not present

## 2020-09-02 DIAGNOSIS — E7841 Elevated Lipoprotein(a): Secondary | ICD-10-CM | POA: Diagnosis not present

## 2020-09-02 DIAGNOSIS — D631 Anemia in chronic kidney disease: Secondary | ICD-10-CM | POA: Diagnosis not present

## 2020-09-03 DIAGNOSIS — Z992 Dependence on renal dialysis: Secondary | ICD-10-CM | POA: Diagnosis not present

## 2020-09-03 DIAGNOSIS — D631 Anemia in chronic kidney disease: Secondary | ICD-10-CM | POA: Diagnosis not present

## 2020-09-03 DIAGNOSIS — K769 Liver disease, unspecified: Secondary | ICD-10-CM | POA: Diagnosis not present

## 2020-09-03 DIAGNOSIS — N2589 Other disorders resulting from impaired renal tubular function: Secondary | ICD-10-CM | POA: Diagnosis not present

## 2020-09-03 DIAGNOSIS — D509 Iron deficiency anemia, unspecified: Secondary | ICD-10-CM | POA: Diagnosis not present

## 2020-09-03 DIAGNOSIS — N2581 Secondary hyperparathyroidism of renal origin: Secondary | ICD-10-CM | POA: Diagnosis not present

## 2020-09-03 DIAGNOSIS — N186 End stage renal disease: Secondary | ICD-10-CM | POA: Diagnosis not present

## 2020-09-03 DIAGNOSIS — Z79899 Other long term (current) drug therapy: Secondary | ICD-10-CM | POA: Diagnosis not present

## 2020-09-03 DIAGNOSIS — E440 Moderate protein-calorie malnutrition: Secondary | ICD-10-CM | POA: Diagnosis not present

## 2020-09-03 DIAGNOSIS — E878 Other disorders of electrolyte and fluid balance, not elsewhere classified: Secondary | ICD-10-CM | POA: Diagnosis not present

## 2020-09-03 DIAGNOSIS — E7841 Elevated Lipoprotein(a): Secondary | ICD-10-CM | POA: Diagnosis not present

## 2020-09-04 DIAGNOSIS — E878 Other disorders of electrolyte and fluid balance, not elsewhere classified: Secondary | ICD-10-CM | POA: Diagnosis not present

## 2020-09-04 DIAGNOSIS — K769 Liver disease, unspecified: Secondary | ICD-10-CM | POA: Diagnosis not present

## 2020-09-04 DIAGNOSIS — Z992 Dependence on renal dialysis: Secondary | ICD-10-CM | POA: Diagnosis not present

## 2020-09-04 DIAGNOSIS — N2589 Other disorders resulting from impaired renal tubular function: Secondary | ICD-10-CM | POA: Diagnosis not present

## 2020-09-04 DIAGNOSIS — E7841 Elevated Lipoprotein(a): Secondary | ICD-10-CM | POA: Diagnosis not present

## 2020-09-04 DIAGNOSIS — I129 Hypertensive chronic kidney disease with stage 1 through stage 4 chronic kidney disease, or unspecified chronic kidney disease: Secondary | ICD-10-CM | POA: Diagnosis not present

## 2020-09-04 DIAGNOSIS — D631 Anemia in chronic kidney disease: Secondary | ICD-10-CM | POA: Diagnosis not present

## 2020-09-04 DIAGNOSIS — E440 Moderate protein-calorie malnutrition: Secondary | ICD-10-CM | POA: Diagnosis not present

## 2020-09-04 DIAGNOSIS — Z79899 Other long term (current) drug therapy: Secondary | ICD-10-CM | POA: Diagnosis not present

## 2020-09-04 DIAGNOSIS — N186 End stage renal disease: Secondary | ICD-10-CM | POA: Diagnosis not present

## 2020-09-04 DIAGNOSIS — D509 Iron deficiency anemia, unspecified: Secondary | ICD-10-CM | POA: Diagnosis not present

## 2020-09-04 DIAGNOSIS — N2581 Secondary hyperparathyroidism of renal origin: Secondary | ICD-10-CM | POA: Diagnosis not present

## 2020-09-05 DIAGNOSIS — D631 Anemia in chronic kidney disease: Secondary | ICD-10-CM | POA: Diagnosis not present

## 2020-09-05 DIAGNOSIS — D509 Iron deficiency anemia, unspecified: Secondary | ICD-10-CM | POA: Diagnosis not present

## 2020-09-05 DIAGNOSIS — N2589 Other disorders resulting from impaired renal tubular function: Secondary | ICD-10-CM | POA: Diagnosis not present

## 2020-09-05 DIAGNOSIS — N186 End stage renal disease: Secondary | ICD-10-CM | POA: Diagnosis not present

## 2020-09-05 DIAGNOSIS — K769 Liver disease, unspecified: Secondary | ICD-10-CM | POA: Diagnosis not present

## 2020-09-05 DIAGNOSIS — Z79899 Other long term (current) drug therapy: Secondary | ICD-10-CM | POA: Diagnosis not present

## 2020-09-05 DIAGNOSIS — Z992 Dependence on renal dialysis: Secondary | ICD-10-CM | POA: Diagnosis not present

## 2020-09-05 DIAGNOSIS — N2581 Secondary hyperparathyroidism of renal origin: Secondary | ICD-10-CM | POA: Diagnosis not present

## 2020-09-05 DIAGNOSIS — E440 Moderate protein-calorie malnutrition: Secondary | ICD-10-CM | POA: Diagnosis not present

## 2020-09-05 DIAGNOSIS — E878 Other disorders of electrolyte and fluid balance, not elsewhere classified: Secondary | ICD-10-CM | POA: Diagnosis not present

## 2020-09-05 DIAGNOSIS — E7841 Elevated Lipoprotein(a): Secondary | ICD-10-CM | POA: Diagnosis not present

## 2020-09-06 DIAGNOSIS — K769 Liver disease, unspecified: Secondary | ICD-10-CM | POA: Diagnosis not present

## 2020-09-06 DIAGNOSIS — N2589 Other disorders resulting from impaired renal tubular function: Secondary | ICD-10-CM | POA: Diagnosis not present

## 2020-09-06 DIAGNOSIS — N186 End stage renal disease: Secondary | ICD-10-CM | POA: Diagnosis not present

## 2020-09-06 DIAGNOSIS — Z992 Dependence on renal dialysis: Secondary | ICD-10-CM | POA: Diagnosis not present

## 2020-09-06 DIAGNOSIS — N2581 Secondary hyperparathyroidism of renal origin: Secondary | ICD-10-CM | POA: Diagnosis not present

## 2020-09-06 DIAGNOSIS — E440 Moderate protein-calorie malnutrition: Secondary | ICD-10-CM | POA: Diagnosis not present

## 2020-09-06 DIAGNOSIS — D509 Iron deficiency anemia, unspecified: Secondary | ICD-10-CM | POA: Diagnosis not present

## 2020-09-06 DIAGNOSIS — E878 Other disorders of electrolyte and fluid balance, not elsewhere classified: Secondary | ICD-10-CM | POA: Diagnosis not present

## 2020-09-06 DIAGNOSIS — D631 Anemia in chronic kidney disease: Secondary | ICD-10-CM | POA: Diagnosis not present

## 2020-09-06 DIAGNOSIS — Z79899 Other long term (current) drug therapy: Secondary | ICD-10-CM | POA: Diagnosis not present

## 2020-09-06 DIAGNOSIS — Z720 Tobacco use: Secondary | ICD-10-CM | POA: Diagnosis not present

## 2020-09-06 DIAGNOSIS — E7841 Elevated Lipoprotein(a): Secondary | ICD-10-CM | POA: Diagnosis not present

## 2020-09-06 DIAGNOSIS — Z7682 Awaiting organ transplant status: Secondary | ICD-10-CM | POA: Diagnosis not present

## 2020-09-07 DIAGNOSIS — D509 Iron deficiency anemia, unspecified: Secondary | ICD-10-CM | POA: Diagnosis not present

## 2020-09-07 DIAGNOSIS — N2589 Other disorders resulting from impaired renal tubular function: Secondary | ICD-10-CM | POA: Diagnosis not present

## 2020-09-07 DIAGNOSIS — N2581 Secondary hyperparathyroidism of renal origin: Secondary | ICD-10-CM | POA: Diagnosis not present

## 2020-09-07 DIAGNOSIS — N186 End stage renal disease: Secondary | ICD-10-CM | POA: Diagnosis not present

## 2020-09-07 DIAGNOSIS — K769 Liver disease, unspecified: Secondary | ICD-10-CM | POA: Diagnosis not present

## 2020-09-07 DIAGNOSIS — Z992 Dependence on renal dialysis: Secondary | ICD-10-CM | POA: Diagnosis not present

## 2020-09-07 DIAGNOSIS — D631 Anemia in chronic kidney disease: Secondary | ICD-10-CM | POA: Diagnosis not present

## 2020-09-07 DIAGNOSIS — Z79899 Other long term (current) drug therapy: Secondary | ICD-10-CM | POA: Diagnosis not present

## 2020-09-07 DIAGNOSIS — E440 Moderate protein-calorie malnutrition: Secondary | ICD-10-CM | POA: Diagnosis not present

## 2020-09-07 DIAGNOSIS — E878 Other disorders of electrolyte and fluid balance, not elsewhere classified: Secondary | ICD-10-CM | POA: Diagnosis not present

## 2020-09-07 DIAGNOSIS — E7841 Elevated Lipoprotein(a): Secondary | ICD-10-CM | POA: Diagnosis not present

## 2020-09-08 DIAGNOSIS — N2581 Secondary hyperparathyroidism of renal origin: Secondary | ICD-10-CM | POA: Diagnosis not present

## 2020-09-08 DIAGNOSIS — E7841 Elevated Lipoprotein(a): Secondary | ICD-10-CM | POA: Diagnosis not present

## 2020-09-08 DIAGNOSIS — N186 End stage renal disease: Secondary | ICD-10-CM | POA: Diagnosis not present

## 2020-09-08 DIAGNOSIS — E878 Other disorders of electrolyte and fluid balance, not elsewhere classified: Secondary | ICD-10-CM | POA: Diagnosis not present

## 2020-09-08 DIAGNOSIS — K769 Liver disease, unspecified: Secondary | ICD-10-CM | POA: Diagnosis not present

## 2020-09-08 DIAGNOSIS — D509 Iron deficiency anemia, unspecified: Secondary | ICD-10-CM | POA: Diagnosis not present

## 2020-09-08 DIAGNOSIS — Z992 Dependence on renal dialysis: Secondary | ICD-10-CM | POA: Diagnosis not present

## 2020-09-08 DIAGNOSIS — D631 Anemia in chronic kidney disease: Secondary | ICD-10-CM | POA: Diagnosis not present

## 2020-09-08 DIAGNOSIS — E440 Moderate protein-calorie malnutrition: Secondary | ICD-10-CM | POA: Diagnosis not present

## 2020-09-08 DIAGNOSIS — Z79899 Other long term (current) drug therapy: Secondary | ICD-10-CM | POA: Diagnosis not present

## 2020-09-08 DIAGNOSIS — N2589 Other disorders resulting from impaired renal tubular function: Secondary | ICD-10-CM | POA: Diagnosis not present

## 2020-09-09 DIAGNOSIS — E7841 Elevated Lipoprotein(a): Secondary | ICD-10-CM | POA: Diagnosis not present

## 2020-09-09 DIAGNOSIS — E440 Moderate protein-calorie malnutrition: Secondary | ICD-10-CM | POA: Diagnosis not present

## 2020-09-09 DIAGNOSIS — Z992 Dependence on renal dialysis: Secondary | ICD-10-CM | POA: Diagnosis not present

## 2020-09-09 DIAGNOSIS — N2581 Secondary hyperparathyroidism of renal origin: Secondary | ICD-10-CM | POA: Diagnosis not present

## 2020-09-09 DIAGNOSIS — E878 Other disorders of electrolyte and fluid balance, not elsewhere classified: Secondary | ICD-10-CM | POA: Diagnosis not present

## 2020-09-09 DIAGNOSIS — D509 Iron deficiency anemia, unspecified: Secondary | ICD-10-CM | POA: Diagnosis not present

## 2020-09-09 DIAGNOSIS — K769 Liver disease, unspecified: Secondary | ICD-10-CM | POA: Diagnosis not present

## 2020-09-09 DIAGNOSIS — Z79899 Other long term (current) drug therapy: Secondary | ICD-10-CM | POA: Diagnosis not present

## 2020-09-09 DIAGNOSIS — N186 End stage renal disease: Secondary | ICD-10-CM | POA: Diagnosis not present

## 2020-09-09 DIAGNOSIS — N2589 Other disorders resulting from impaired renal tubular function: Secondary | ICD-10-CM | POA: Diagnosis not present

## 2020-09-09 DIAGNOSIS — D631 Anemia in chronic kidney disease: Secondary | ICD-10-CM | POA: Diagnosis not present

## 2020-09-10 DIAGNOSIS — Z992 Dependence on renal dialysis: Secondary | ICD-10-CM | POA: Diagnosis not present

## 2020-09-10 DIAGNOSIS — N186 End stage renal disease: Secondary | ICD-10-CM | POA: Diagnosis not present

## 2020-09-10 DIAGNOSIS — D631 Anemia in chronic kidney disease: Secondary | ICD-10-CM | POA: Diagnosis not present

## 2020-09-10 DIAGNOSIS — D509 Iron deficiency anemia, unspecified: Secondary | ICD-10-CM | POA: Diagnosis not present

## 2020-09-10 DIAGNOSIS — E7841 Elevated Lipoprotein(a): Secondary | ICD-10-CM | POA: Diagnosis not present

## 2020-09-10 DIAGNOSIS — Z79899 Other long term (current) drug therapy: Secondary | ICD-10-CM | POA: Diagnosis not present

## 2020-09-10 DIAGNOSIS — N2589 Other disorders resulting from impaired renal tubular function: Secondary | ICD-10-CM | POA: Diagnosis not present

## 2020-09-10 DIAGNOSIS — E440 Moderate protein-calorie malnutrition: Secondary | ICD-10-CM | POA: Diagnosis not present

## 2020-09-10 DIAGNOSIS — N2581 Secondary hyperparathyroidism of renal origin: Secondary | ICD-10-CM | POA: Diagnosis not present

## 2020-09-10 DIAGNOSIS — E878 Other disorders of electrolyte and fluid balance, not elsewhere classified: Secondary | ICD-10-CM | POA: Diagnosis not present

## 2020-09-10 DIAGNOSIS — K769 Liver disease, unspecified: Secondary | ICD-10-CM | POA: Diagnosis not present

## 2020-09-11 DIAGNOSIS — N2581 Secondary hyperparathyroidism of renal origin: Secondary | ICD-10-CM | POA: Diagnosis not present

## 2020-09-11 DIAGNOSIS — E440 Moderate protein-calorie malnutrition: Secondary | ICD-10-CM | POA: Diagnosis not present

## 2020-09-11 DIAGNOSIS — K769 Liver disease, unspecified: Secondary | ICD-10-CM | POA: Diagnosis not present

## 2020-09-11 DIAGNOSIS — N186 End stage renal disease: Secondary | ICD-10-CM | POA: Diagnosis not present

## 2020-09-11 DIAGNOSIS — Z79899 Other long term (current) drug therapy: Secondary | ICD-10-CM | POA: Diagnosis not present

## 2020-09-11 DIAGNOSIS — Z992 Dependence on renal dialysis: Secondary | ICD-10-CM | POA: Diagnosis not present

## 2020-09-11 DIAGNOSIS — E7841 Elevated Lipoprotein(a): Secondary | ICD-10-CM | POA: Diagnosis not present

## 2020-09-11 DIAGNOSIS — E878 Other disorders of electrolyte and fluid balance, not elsewhere classified: Secondary | ICD-10-CM | POA: Diagnosis not present

## 2020-09-11 DIAGNOSIS — D509 Iron deficiency anemia, unspecified: Secondary | ICD-10-CM | POA: Diagnosis not present

## 2020-09-11 DIAGNOSIS — D631 Anemia in chronic kidney disease: Secondary | ICD-10-CM | POA: Diagnosis not present

## 2020-09-11 DIAGNOSIS — N2589 Other disorders resulting from impaired renal tubular function: Secondary | ICD-10-CM | POA: Diagnosis not present

## 2020-09-12 ENCOUNTER — Other Ambulatory Visit: Payer: Self-pay

## 2020-09-12 ENCOUNTER — Encounter (HOSPITAL_COMMUNITY): Payer: Self-pay | Admitting: Emergency Medicine

## 2020-09-12 ENCOUNTER — Ambulatory Visit (HOSPITAL_COMMUNITY)
Admission: EM | Admit: 2020-09-12 | Discharge: 2020-09-12 | Disposition: A | Discharge status: Home / Self Care | Payer: Medicare Other | Attending: Medical Oncology | Admitting: Medical Oncology

## 2020-09-12 DIAGNOSIS — N186 End stage renal disease: Secondary | ICD-10-CM | POA: Insufficient documentation

## 2020-09-12 DIAGNOSIS — E440 Moderate protein-calorie malnutrition: Secondary | ICD-10-CM | POA: Diagnosis not present

## 2020-09-12 DIAGNOSIS — K769 Liver disease, unspecified: Secondary | ICD-10-CM | POA: Diagnosis not present

## 2020-09-12 DIAGNOSIS — D631 Anemia in chronic kidney disease: Secondary | ICD-10-CM | POA: Diagnosis not present

## 2020-09-12 DIAGNOSIS — Z992 Dependence on renal dialysis: Secondary | ICD-10-CM

## 2020-09-12 DIAGNOSIS — R252 Cramp and spasm: Secondary | ICD-10-CM | POA: Diagnosis not present

## 2020-09-12 DIAGNOSIS — E7841 Elevated Lipoprotein(a): Secondary | ICD-10-CM | POA: Diagnosis not present

## 2020-09-12 DIAGNOSIS — N2589 Other disorders resulting from impaired renal tubular function: Secondary | ICD-10-CM | POA: Diagnosis not present

## 2020-09-12 DIAGNOSIS — N2581 Secondary hyperparathyroidism of renal origin: Secondary | ICD-10-CM | POA: Diagnosis not present

## 2020-09-12 DIAGNOSIS — D509 Iron deficiency anemia, unspecified: Secondary | ICD-10-CM | POA: Diagnosis not present

## 2020-09-12 DIAGNOSIS — Z79899 Other long term (current) drug therapy: Secondary | ICD-10-CM | POA: Diagnosis not present

## 2020-09-12 DIAGNOSIS — E878 Other disorders of electrolyte and fluid balance, not elsewhere classified: Secondary | ICD-10-CM | POA: Diagnosis not present

## 2020-09-12 LAB — CBC
HCT: 34.8 % — ABNORMAL LOW (ref 39.0–52.0)
Hemoglobin: 11.2 g/dL — ABNORMAL LOW (ref 13.0–17.0)
MCH: 35.2 pg — ABNORMAL HIGH (ref 26.0–34.0)
MCHC: 32.2 g/dL (ref 30.0–36.0)
MCV: 109.4 fL — ABNORMAL HIGH (ref 80.0–100.0)
Platelets: 271 K/uL (ref 150–400)
RBC: 3.18 MIL/uL — ABNORMAL LOW (ref 4.22–5.81)
RDW: 13.7 % (ref 11.5–15.5)
WBC: 9.8 K/uL (ref 4.0–10.5)
nRBC: 0 % (ref 0.0–0.2)

## 2020-09-12 LAB — BASIC METABOLIC PANEL
Anion gap: 10 (ref 5–15)
BUN: 62 mg/dL — ABNORMAL HIGH (ref 6–20)
CO2: 26 mmol/L (ref 22–32)
Calcium: 9.1 mg/dL (ref 8.9–10.3)
Chloride: 104 mmol/L (ref 98–111)
Creatinine, Ser: 11.67 mg/dL — ABNORMAL HIGH (ref 0.61–1.24)
GFR, Estimated: 5 mL/min — ABNORMAL LOW (ref >60)
Glucose, Bld: 92 mg/dL (ref 70–99)
Potassium: 5 mmol/L (ref 3.5–5.1)
Sodium: 140 mmol/L (ref 135–145)

## 2020-09-12 LAB — POCT URINALYSIS DIPSTICK, ED / UC
Bilirubin Urine: NEGATIVE
Glucose, UA: NEGATIVE mg/dL
Ketones, ur: NEGATIVE mg/dL
Leukocytes,Ua: NEGATIVE
Nitrite: NEGATIVE
Protein, ur: 100 mg/dL — AB
Specific Gravity, Urine: 1.015 (ref 1.005–1.030)
Urobilinogen, UA: 0.2 mg/dL (ref 0.0–1.0)
pH: 7.5 (ref 5.0–8.0)

## 2020-09-12 NOTE — ED Provider Notes (Signed)
[place]    CSN: [id number] Arrival date & time: 09/12/20  6945      History   Chief Complaint Chief Complaint  Patient presents with  . Headache  . Leg Pain  . Hand Pain    HPI Cory Dixon is a 35 y.o. male.   HPI   Cramps: Patient reports that he has had a mild headache along with cramps in both legs and hands that started last night.  Patient performs home dialysis nightly.  He wonders if this may be contributing to his pain as he thinks his machine may have pulled off too much fluid considering that he only had one coffee to drink yesterday as he was busy and did not get a chance to drink any water. He reports that he is not on any fluid restrictions.  He denies any shortness of breath or chest pain.  He is not having any trouble with visual changes, fevers, neck stiffness, or neurological changes. Headache is 6/10 in nature. He has tried tylenol for symptoms which did help headache. Of note he pulls up labs from his specialist from the 4th of last month which show a hemoglobin of 11.2 and a normal potassium.   Past Medical History:  Diagnosis Date  . Anemia   . Chicken pox   . CKD (chronic kidney disease), stage V ([place])   . Headache(784.0)   . Hyperkalemia 03/2017  . Hypertension   . Migraine   . Peritoneal dialysis status [facility])     Patient Active Problem List   Diagnosis Date Noted  . Pain, rectal 02/08/2020  . Constipation 02/08/2020  . Smoking 04/02/2017  . History of atrial fibrillation 04/02/2017  . ESRD (end stage renal disease) ([place]) 03/20/2017  . Acute hyperkalemia 03/19/2017  . CKD (chronic kidney disease) stage V requiring chronic dialysis ([place]) 03/19/2017  . HTN (hypertension) 03/19/2017  . Abnormal EKG 03/19/2017  . Hypertension 12/30/2010  . Migraine headache 12/30/2010    Past Surgical History:  Procedure Laterality Date  . DIALYSIS FISTULA CREATION    . PERITONEAL CATHETER INSERTION         Home Medications    Prior  to Admission medications   Medication Sig Start Date End Date Taking? Authorizing Provider  AURYXIA 1 GM 210 MG(Fe) tablet Take 630 mg by mouth 3 (three) times daily as needed. 01/02/20   [provider]  B Complex-C-Folic Acid (DIALYVITE 038) 0.8 MG TABS Take 1 tablet by mouth daily. 12/09/19   [provider]  calcitRIOL (ROCALTROL) 0.25 MCG capsule Take by mouth. 11/15/18   [provider]  Cholecalciferol 25 MCG (1000 UT) tablet Take by mouth.    [provider]  cinacalcet (SENSIPAR) 60 MG tablet Take by mouth. 11/15/18   [provider]  docusate sodium (COLACE) 100 MG capsule Take 1 capsule (100 mg total) by mouth every 12 (twelve) hours. 02/02/20   Montine Circle, PA-C  furosemide (LASIX) 80 MG tablet Take 80 mg by mouth daily. 09/08/19   [provider]  gentamicin cream (GARAMYCIN) 0.1 % Apply topically. 04/14/18   [provider]  hydrocortisone (ANUCORT-HC) 25 MG suppository Place 1 suppository (25 mg total) rectally 2 (two) times daily as needed for up to 12 doses for hemorrhoids or anal itching. 88/2/80   Defelice, Jeanett Schlein, NP  Lidocaine 0.5 % GEL Apply around the rectum, but NOT in the rectum. 02/02/20   Montine Circle, PA-C  lisinopril (PRINIVIL,ZESTRIL) 10 MG tablet Take 10 mg  by mouth daily.    [provider]  Methoxy PEG-Epoetin Beta (MIRCERA IJ) Inject into the skin. 06/15/19   [provider]  PEG-KCl-NaCl-NaSulf-Na Asc-C (PLENVU) 140 g SOLR Take 1 kit by mouth as directed. Use coupon: BIN: [id number] PNC: CNRX Group: [id number] ID: [id number] 02/21/20   Thornton Park, MD  polyethylene glycol powder (GLYCOLAX/MIRALAX) 17 GM/SCOOP powder Take 17 g by mouth 2 (two) times daily. 02/02/20   Montine Circle, PA-C    Family History Family History  Problem Relation Age of Onset  . Arthritis Mother   . Arthritis Maternal Grandmother   . Hypertension Maternal Grandmother   . Depression Maternal  Grandmother   . Breast cancer Maternal Grandmother   . Hypertension Maternal Grandfather   . Hypertension Paternal Grandmother   . Depression Paternal Grandmother   . Hypertension Paternal Grandfather   . Diabetes Brother        type 1 diabetes  . Liver cancer Maternal Aunt   . Prostate cancer Maternal Uncle   . Colon cancer Neg Hx   . Stomach cancer Neg Hx   . Esophageal cancer Neg Hx   . Pancreatic cancer Neg Hx     Social History Social History   Tobacco Use  . Smoking status: Current Every Day Smoker    Packs/day: 0.20    Years: 8.00    Pack years: 1.60    Types: Cigarettes  . Smokeless tobacco: Never Used  Vaping Use  . Vaping Use: Never used  Substance Use Topics  . Alcohol use: No  . Drug use: No     Allergies   Norvasc [amlodipine], Nicotine, and Percocet [oxycodone-acetaminophen]   Review of Systems Review of Systems  As stated above in HPI Physical Exam Triage Vital Signs ED Triage Vitals  Enc Vitals Group     BP 09/12/20 1009 (!) 147/88     Pulse Rate 09/12/20 1009 82     Resp 09/12/20 1009 18     Temp 09/12/20 1009 97.9 F (36.6 C)     Temp Source 09/12/20 1009 Oral     SpO2 09/12/20 1009 96 %     Weight --      Height --      Head Circumference --      Peak Flow --      Pain Score 09/12/20 1011 8     Pain Loc --      Pain Edu? --      Excl. in [place]? --    No data found.  Updated Vital Signs BP (!) 147/88   Pulse 82   Temp 97.9 F (36.6 C) (Oral)   Resp 18   SpO2 96%   Physical Exam Vitals and nursing note reviewed.  Constitutional:      General: He is not in acute distress.    Appearance: He is well-developed. He is not ill-appearing, toxic-appearing or diaphoretic.  HENT:     Head: Normocephalic and atraumatic.  Eyes:     General: No scleral icterus.    Extraocular Movements: Extraocular movements intact.     Right eye: Normal extraocular motion and no nystagmus.     Left eye: Normal extraocular motion and no nystagmus.      Pupils: Pupils are equal, round, and reactive to light. Pupils are equal.     Right eye: Pupil is round and reactive.     Left eye: Pupil is round and reactive.  Cardiovascular:     Rate and Rhythm: Normal rate  and regular rhythm.     Heart sounds: Normal heart sounds.  Pulmonary:     Effort: Pulmonary effort is normal.     Breath sounds: Normal breath sounds.  Musculoskeletal:     Cervical back: Normal range of motion and neck supple. No rigidity.  Lymphadenopathy:     Cervical: No cervical adenopathy.  Skin:    General: Skin is warm.     Coloration: Skin is not pale.  Neurological:     Mental Status: He is alert and oriented to person, place, and time.     Cranial Nerves: No cranial nerve deficit.     Sensory: No sensory deficit.     Motor: No weakness.     Coordination: Romberg sign negative. Coordination normal.     Gait: Gait normal.  Psychiatric:        Mood and Affect: Mood normal.        Speech: Speech normal.        Behavior: Behavior normal.      UC Treatments / Results  Labs (all labs ordered are listed, but only abnormal results are displayed) Labs Reviewed  POCT URINALYSIS DIPSTICK, ED / UC - Abnormal; Notable for the following components:      Result Value   Hgb urine dipstick SMALL (*)    Protein, ur 100 (*)    All other components within normal limits    EKG   Radiology No results found.  Procedures Procedures (including critical care time)  Medications Ordered in UC Medications - No data to display  Initial Impression / Assessment and Plan / UC Course  I have reviewed the triage vital signs and the nursing notes.  Pertinent labs & imaging results that were available during my care of the patient were reviewed by me and considered in my medical decision making (see chart for details).     New. Pt requests fluids. My preference would be for him to orally rehydrate and for [place] to check a CBC and BMP. I have agreed with patient that if his  urinalysis looks like significant dehydration we can discuss IV fluids.   UPDATE: No significant sign of dehydration of UA- will obtain labs and have him orally hydrate. Discussed red flag signs and symptoms.    Final Clinical Impressions(s) / UC Diagnoses   Final diagnoses:  Cramps, muscle, general  ESRD on dialysis [facility])   Discharge Instructions   None    ED Prescriptions    None     PDMP not reviewed this encounter.   Hughie Closs, [place] 09/12/20 1041

## 2020-09-12 NOTE — ED Triage Notes (Signed)
Pt is present today with a headache, cramps in both legs, and hands. Pt states that his pain started last night. Pt states that he does at home dialysis and wonders if he that may be contributing to his pains today

## 2020-09-13 DIAGNOSIS — K769 Liver disease, unspecified: Secondary | ICD-10-CM | POA: Diagnosis not present

## 2020-09-13 DIAGNOSIS — D631 Anemia in chronic kidney disease: Secondary | ICD-10-CM | POA: Diagnosis not present

## 2020-09-13 DIAGNOSIS — N2589 Other disorders resulting from impaired renal tubular function: Secondary | ICD-10-CM | POA: Diagnosis not present

## 2020-09-13 DIAGNOSIS — E7841 Elevated Lipoprotein(a): Secondary | ICD-10-CM | POA: Diagnosis not present

## 2020-09-13 DIAGNOSIS — E878 Other disorders of electrolyte and fluid balance, not elsewhere classified: Secondary | ICD-10-CM | POA: Diagnosis not present

## 2020-09-13 DIAGNOSIS — D509 Iron deficiency anemia, unspecified: Secondary | ICD-10-CM | POA: Diagnosis not present

## 2020-09-13 DIAGNOSIS — Z992 Dependence on renal dialysis: Secondary | ICD-10-CM | POA: Diagnosis not present

## 2020-09-13 DIAGNOSIS — N186 End stage renal disease: Secondary | ICD-10-CM | POA: Diagnosis not present

## 2020-09-13 DIAGNOSIS — N2581 Secondary hyperparathyroidism of renal origin: Secondary | ICD-10-CM | POA: Diagnosis not present

## 2020-09-13 DIAGNOSIS — Z79899 Other long term (current) drug therapy: Secondary | ICD-10-CM | POA: Diagnosis not present

## 2020-09-13 DIAGNOSIS — E440 Moderate protein-calorie malnutrition: Secondary | ICD-10-CM | POA: Diagnosis not present

## 2020-09-14 DIAGNOSIS — D631 Anemia in chronic kidney disease: Secondary | ICD-10-CM | POA: Diagnosis not present

## 2020-09-14 DIAGNOSIS — N2581 Secondary hyperparathyroidism of renal origin: Secondary | ICD-10-CM | POA: Diagnosis not present

## 2020-09-14 DIAGNOSIS — E440 Moderate protein-calorie malnutrition: Secondary | ICD-10-CM | POA: Diagnosis not present

## 2020-09-14 DIAGNOSIS — Z79899 Other long term (current) drug therapy: Secondary | ICD-10-CM | POA: Diagnosis not present

## 2020-09-14 DIAGNOSIS — E878 Other disorders of electrolyte and fluid balance, not elsewhere classified: Secondary | ICD-10-CM | POA: Diagnosis not present

## 2020-09-14 DIAGNOSIS — N2589 Other disorders resulting from impaired renal tubular function: Secondary | ICD-10-CM | POA: Diagnosis not present

## 2020-09-14 DIAGNOSIS — D509 Iron deficiency anemia, unspecified: Secondary | ICD-10-CM | POA: Diagnosis not present

## 2020-09-14 DIAGNOSIS — K769 Liver disease, unspecified: Secondary | ICD-10-CM | POA: Diagnosis not present

## 2020-09-14 DIAGNOSIS — N186 End stage renal disease: Secondary | ICD-10-CM | POA: Diagnosis not present

## 2020-09-14 DIAGNOSIS — E7841 Elevated Lipoprotein(a): Secondary | ICD-10-CM | POA: Diagnosis not present

## 2020-09-14 DIAGNOSIS — Z992 Dependence on renal dialysis: Secondary | ICD-10-CM | POA: Diagnosis not present

## 2020-09-15 DIAGNOSIS — E7841 Elevated Lipoprotein(a): Secondary | ICD-10-CM | POA: Diagnosis not present

## 2020-09-15 DIAGNOSIS — N2589 Other disorders resulting from impaired renal tubular function: Secondary | ICD-10-CM | POA: Diagnosis not present

## 2020-09-15 DIAGNOSIS — E440 Moderate protein-calorie malnutrition: Secondary | ICD-10-CM | POA: Diagnosis not present

## 2020-09-15 DIAGNOSIS — Z992 Dependence on renal dialysis: Secondary | ICD-10-CM | POA: Diagnosis not present

## 2020-09-15 DIAGNOSIS — Z79899 Other long term (current) drug therapy: Secondary | ICD-10-CM | POA: Diagnosis not present

## 2020-09-15 DIAGNOSIS — D631 Anemia in chronic kidney disease: Secondary | ICD-10-CM | POA: Diagnosis not present

## 2020-09-15 DIAGNOSIS — E878 Other disorders of electrolyte and fluid balance, not elsewhere classified: Secondary | ICD-10-CM | POA: Diagnosis not present

## 2020-09-15 DIAGNOSIS — D509 Iron deficiency anemia, unspecified: Secondary | ICD-10-CM | POA: Diagnosis not present

## 2020-09-15 DIAGNOSIS — N186 End stage renal disease: Secondary | ICD-10-CM | POA: Diagnosis not present

## 2020-09-15 DIAGNOSIS — K769 Liver disease, unspecified: Secondary | ICD-10-CM | POA: Diagnosis not present

## 2020-09-15 DIAGNOSIS — N2581 Secondary hyperparathyroidism of renal origin: Secondary | ICD-10-CM | POA: Diagnosis not present

## 2020-09-16 DIAGNOSIS — N2581 Secondary hyperparathyroidism of renal origin: Secondary | ICD-10-CM | POA: Diagnosis not present

## 2020-09-16 DIAGNOSIS — D509 Iron deficiency anemia, unspecified: Secondary | ICD-10-CM | POA: Diagnosis not present

## 2020-09-16 DIAGNOSIS — N2589 Other disorders resulting from impaired renal tubular function: Secondary | ICD-10-CM | POA: Diagnosis not present

## 2020-09-16 DIAGNOSIS — D631 Anemia in chronic kidney disease: Secondary | ICD-10-CM | POA: Diagnosis not present

## 2020-09-16 DIAGNOSIS — E440 Moderate protein-calorie malnutrition: Secondary | ICD-10-CM | POA: Diagnosis not present

## 2020-09-16 DIAGNOSIS — Z79899 Other long term (current) drug therapy: Secondary | ICD-10-CM | POA: Diagnosis not present

## 2020-09-16 DIAGNOSIS — E878 Other disorders of electrolyte and fluid balance, not elsewhere classified: Secondary | ICD-10-CM | POA: Diagnosis not present

## 2020-09-16 DIAGNOSIS — K769 Liver disease, unspecified: Secondary | ICD-10-CM | POA: Diagnosis not present

## 2020-09-16 DIAGNOSIS — N186 End stage renal disease: Secondary | ICD-10-CM | POA: Diagnosis not present

## 2020-09-16 DIAGNOSIS — Z992 Dependence on renal dialysis: Secondary | ICD-10-CM | POA: Diagnosis not present

## 2020-09-16 DIAGNOSIS — E7841 Elevated Lipoprotein(a): Secondary | ICD-10-CM | POA: Diagnosis not present

## 2020-09-17 DIAGNOSIS — Z992 Dependence on renal dialysis: Secondary | ICD-10-CM | POA: Diagnosis not present

## 2020-09-17 DIAGNOSIS — E440 Moderate protein-calorie malnutrition: Secondary | ICD-10-CM | POA: Diagnosis not present

## 2020-09-17 DIAGNOSIS — D509 Iron deficiency anemia, unspecified: Secondary | ICD-10-CM | POA: Diagnosis not present

## 2020-09-17 DIAGNOSIS — Z79899 Other long term (current) drug therapy: Secondary | ICD-10-CM | POA: Diagnosis not present

## 2020-09-17 DIAGNOSIS — E878 Other disorders of electrolyte and fluid balance, not elsewhere classified: Secondary | ICD-10-CM | POA: Diagnosis not present

## 2020-09-17 DIAGNOSIS — E7841 Elevated Lipoprotein(a): Secondary | ICD-10-CM | POA: Diagnosis not present

## 2020-09-17 DIAGNOSIS — N186 End stage renal disease: Secondary | ICD-10-CM | POA: Diagnosis not present

## 2020-09-17 DIAGNOSIS — N2581 Secondary hyperparathyroidism of renal origin: Secondary | ICD-10-CM | POA: Diagnosis not present

## 2020-09-17 DIAGNOSIS — D631 Anemia in chronic kidney disease: Secondary | ICD-10-CM | POA: Diagnosis not present

## 2020-09-17 DIAGNOSIS — K769 Liver disease, unspecified: Secondary | ICD-10-CM | POA: Diagnosis not present

## 2020-09-17 DIAGNOSIS — N2589 Other disorders resulting from impaired renal tubular function: Secondary | ICD-10-CM | POA: Diagnosis not present

## 2020-09-18 ENCOUNTER — Emergency Department (HOSPITAL_COMMUNITY)
Admission: EM | Admit: 2020-09-18 | Discharge: 2020-09-18 | Disposition: A | Discharge status: Home / Self Care | Payer: Medicare Other

## 2020-09-18 DIAGNOSIS — Z79899 Other long term (current) drug therapy: Secondary | ICD-10-CM | POA: Diagnosis not present

## 2020-09-18 DIAGNOSIS — N186 End stage renal disease: Secondary | ICD-10-CM | POA: Diagnosis not present

## 2020-09-18 DIAGNOSIS — Z992 Dependence on renal dialysis: Secondary | ICD-10-CM | POA: Diagnosis not present

## 2020-09-18 DIAGNOSIS — D509 Iron deficiency anemia, unspecified: Secondary | ICD-10-CM | POA: Diagnosis not present

## 2020-09-18 DIAGNOSIS — E440 Moderate protein-calorie malnutrition: Secondary | ICD-10-CM | POA: Diagnosis not present

## 2020-09-18 DIAGNOSIS — K769 Liver disease, unspecified: Secondary | ICD-10-CM | POA: Diagnosis not present

## 2020-09-18 DIAGNOSIS — N2581 Secondary hyperparathyroidism of renal origin: Secondary | ICD-10-CM | POA: Diagnosis not present

## 2020-09-18 DIAGNOSIS — E878 Other disorders of electrolyte and fluid balance, not elsewhere classified: Secondary | ICD-10-CM | POA: Diagnosis not present

## 2020-09-18 DIAGNOSIS — D631 Anemia in chronic kidney disease: Secondary | ICD-10-CM | POA: Diagnosis not present

## 2020-09-18 DIAGNOSIS — N2589 Other disorders resulting from impaired renal tubular function: Secondary | ICD-10-CM | POA: Diagnosis not present

## 2020-09-18 DIAGNOSIS — E7841 Elevated Lipoprotein(a): Secondary | ICD-10-CM | POA: Diagnosis not present

## 2020-09-19 DIAGNOSIS — D509 Iron deficiency anemia, unspecified: Secondary | ICD-10-CM | POA: Diagnosis not present

## 2020-09-19 DIAGNOSIS — Z992 Dependence on renal dialysis: Secondary | ICD-10-CM | POA: Diagnosis not present

## 2020-09-19 DIAGNOSIS — E878 Other disorders of electrolyte and fluid balance, not elsewhere classified: Secondary | ICD-10-CM | POA: Diagnosis not present

## 2020-09-19 DIAGNOSIS — N186 End stage renal disease: Secondary | ICD-10-CM | POA: Diagnosis not present

## 2020-09-19 DIAGNOSIS — Z79899 Other long term (current) drug therapy: Secondary | ICD-10-CM | POA: Diagnosis not present

## 2020-09-19 DIAGNOSIS — K769 Liver disease, unspecified: Secondary | ICD-10-CM | POA: Diagnosis not present

## 2020-09-19 DIAGNOSIS — D631 Anemia in chronic kidney disease: Secondary | ICD-10-CM | POA: Diagnosis not present

## 2020-09-19 DIAGNOSIS — N2581 Secondary hyperparathyroidism of renal origin: Secondary | ICD-10-CM | POA: Diagnosis not present

## 2020-09-19 DIAGNOSIS — E7841 Elevated Lipoprotein(a): Secondary | ICD-10-CM | POA: Diagnosis not present

## 2020-09-19 DIAGNOSIS — E440 Moderate protein-calorie malnutrition: Secondary | ICD-10-CM | POA: Diagnosis not present

## 2020-09-19 DIAGNOSIS — N2589 Other disorders resulting from impaired renal tubular function: Secondary | ICD-10-CM | POA: Diagnosis not present

## 2020-09-20 DIAGNOSIS — N2581 Secondary hyperparathyroidism of renal origin: Secondary | ICD-10-CM | POA: Diagnosis not present

## 2020-09-20 DIAGNOSIS — K769 Liver disease, unspecified: Secondary | ICD-10-CM | POA: Diagnosis not present

## 2020-09-20 DIAGNOSIS — D509 Iron deficiency anemia, unspecified: Secondary | ICD-10-CM | POA: Diagnosis not present

## 2020-09-20 DIAGNOSIS — E440 Moderate protein-calorie malnutrition: Secondary | ICD-10-CM | POA: Diagnosis not present

## 2020-09-20 DIAGNOSIS — N2589 Other disorders resulting from impaired renal tubular function: Secondary | ICD-10-CM | POA: Diagnosis not present

## 2020-09-20 DIAGNOSIS — Z79899 Other long term (current) drug therapy: Secondary | ICD-10-CM | POA: Diagnosis not present

## 2020-09-20 DIAGNOSIS — E878 Other disorders of electrolyte and fluid balance, not elsewhere classified: Secondary | ICD-10-CM | POA: Diagnosis not present

## 2020-09-20 DIAGNOSIS — D631 Anemia in chronic kidney disease: Secondary | ICD-10-CM | POA: Diagnosis not present

## 2020-09-20 DIAGNOSIS — E7841 Elevated Lipoprotein(a): Secondary | ICD-10-CM | POA: Diagnosis not present

## 2020-09-20 DIAGNOSIS — N186 End stage renal disease: Secondary | ICD-10-CM | POA: Diagnosis not present

## 2020-09-20 DIAGNOSIS — Z992 Dependence on renal dialysis: Secondary | ICD-10-CM | POA: Diagnosis not present

## 2020-09-21 ENCOUNTER — Ambulatory Visit (HOSPITAL_COMMUNITY)
Admission: RE | Admit: 2020-09-21 | Discharge: 2020-09-21 | Disposition: A | Discharge status: Home / Self Care | Payer: Medicare Other | Source: Ambulatory Visit | Attending: Family Medicine | Admitting: Family Medicine

## 2020-09-21 ENCOUNTER — Encounter (HOSPITAL_COMMUNITY): Payer: Self-pay

## 2020-09-21 ENCOUNTER — Other Ambulatory Visit: Payer: Self-pay

## 2020-09-21 VITALS — BP 141/88 | HR 80 | Temp 98.6°F | Resp 18

## 2020-09-21 DIAGNOSIS — Z79899 Other long term (current) drug therapy: Secondary | ICD-10-CM | POA: Diagnosis not present

## 2020-09-21 DIAGNOSIS — N2581 Secondary hyperparathyroidism of renal origin: Secondary | ICD-10-CM | POA: Diagnosis not present

## 2020-09-21 DIAGNOSIS — Z992 Dependence on renal dialysis: Secondary | ICD-10-CM | POA: Diagnosis not present

## 2020-09-21 DIAGNOSIS — D509 Iron deficiency anemia, unspecified: Secondary | ICD-10-CM | POA: Diagnosis not present

## 2020-09-21 DIAGNOSIS — E440 Moderate protein-calorie malnutrition: Secondary | ICD-10-CM | POA: Diagnosis not present

## 2020-09-21 DIAGNOSIS — D170 Benign lipomatous neoplasm of skin and subcutaneous tissue of head, face and neck: Secondary | ICD-10-CM

## 2020-09-21 DIAGNOSIS — E878 Other disorders of electrolyte and fluid balance, not elsewhere classified: Secondary | ICD-10-CM | POA: Diagnosis not present

## 2020-09-21 DIAGNOSIS — N2589 Other disorders resulting from impaired renal tubular function: Secondary | ICD-10-CM | POA: Diagnosis not present

## 2020-09-21 DIAGNOSIS — E7841 Elevated Lipoprotein(a): Secondary | ICD-10-CM | POA: Diagnosis not present

## 2020-09-21 DIAGNOSIS — K769 Liver disease, unspecified: Secondary | ICD-10-CM | POA: Diagnosis not present

## 2020-09-21 DIAGNOSIS — N186 End stage renal disease: Secondary | ICD-10-CM | POA: Diagnosis not present

## 2020-09-21 DIAGNOSIS — D631 Anemia in chronic kidney disease: Secondary | ICD-10-CM | POA: Diagnosis not present

## 2020-09-21 NOTE — ED Triage Notes (Signed)
Pt presents with knot on back of head and reoccurring headaches Xs 1 week. Denies any injury or pain.

## 2020-09-21 NOTE — ED Provider Notes (Signed)
[place]    CSN: [id number] Arrival date & time: 09/21/20  1046      History   Chief Complaint Chief Complaint  Patient presents with   Knot on head    APPT   Headache    HPI Cory Dixon is a 35 y.o. male.   HPI  Knot on scalp: Patient reports that he has had an lump on his left posterior scalp for as long as he can remember.  He states that the area is sometimes tender but for the most part does not cause him any troubles.  He states that he is mainly concerned as his cousin was just diagnosed with a brain tumor.  He reports that when he gets occasional headaches he feels that he has some muscle tension in his neck that radiates up to about where the lesion is.  He does not get these very often and does not currently have any today.  No other lumps on body that are similar.  He has not tried anything for symptoms.  Past Medical History:  Diagnosis Date   Anemia    Chicken pox    CKD (chronic kidney disease), stage V ([facility])    Headache(784.0)    Hyperkalemia 03/2017   Hypertension    Migraine    Peritoneal dialysis status [facility])     Patient Active Problem List   Diagnosis Date Noted   Pain, rectal 02/08/2020   Constipation 02/08/2020   Smoking 04/02/2017   History of atrial fibrillation 04/02/2017   ESRD (end stage renal disease) ([place]) 03/20/2017   Acute hyperkalemia 03/19/2017   CKD (chronic kidney disease) stage V requiring chronic dialysis ([place]) 03/19/2017   HTN (hypertension) 03/19/2017   Abnormal EKG 03/19/2017   Hypertension 12/30/2010   Migraine headache 12/30/2010    Past Surgical History:  Procedure Laterality Date   DIALYSIS FISTULA CREATION     PERITONEAL CATHETER INSERTION         Home Medications    Prior to Admission medications   Medication Sig Start Date End Date Taking? Authorizing Provider  AURYXIA 1 GM 210 MG(Fe) tablet Take 630 mg by mouth 3 (three) times daily as needed. 01/02/20   [provider]  B  Complex-C-Folic Acid (DIALYVITE 195) 0.8 MG TABS Take 1 tablet by mouth daily. 12/09/19   [provider]  calcitRIOL (ROCALTROL) 0.25 MCG capsule Take by mouth. 11/15/18   [provider]  Cholecalciferol 25 MCG (1000 UT) tablet Take by mouth.    [provider]  cinacalcet (SENSIPAR) 60 MG tablet Take by mouth. 11/15/18   [provider]  docusate sodium (COLACE) 100 MG capsule Take 1 capsule (100 mg total) by mouth every 12 (twelve) hours. 02/02/20   Montine Circle, PA-C  furosemide (LASIX) 80 MG tablet Take 80 mg by mouth daily. 09/08/19   [provider]  gentamicin cream (GARAMYCIN) 0.1 % Apply topically. 04/14/18   [provider]  hydrocortisone (ANUCORT-HC) 25 MG suppository Place 1 suppository (25 mg total) rectally 2 (two) times daily as needed for up to 12 doses for hemorrhoids or anal itching. 12/08/24   Defelice, Jeanett Schlein, NP  Lidocaine 0.5 % GEL Apply around the rectum, but NOT in the rectum. 02/02/20   Montine Circle, PA-C  lisinopril (PRINIVIL,ZESTRIL) 10 MG tablet Take 10 mg by mouth daily.    [provider]  Methoxy PEG-Epoetin Beta (MIRCERA IJ) Inject into the skin. 06/15/19   [provider]  PEG-KCl-NaCl-NaSulf-Na Asc-C (PLENVU) 140  g SOLR Take 1 kit by mouth as directed. Use coupon: BIN: [id number] PNC: CNRX Group: [id number] ID: [id number] 02/21/20   Thornton Park, MD  polyethylene glycol powder (GLYCOLAX/MIRALAX) 17 GM/SCOOP powder Take 17 g by mouth 2 (two) times daily. 02/02/20   Montine Circle, PA-C    Family History Family History  Problem Relation Age of Onset   Arthritis Mother    Arthritis Maternal Grandmother    Hypertension Maternal Grandmother    Depression Maternal Grandmother    Breast cancer Maternal Grandmother    Hypertension Maternal Grandfather    Hypertension Paternal Grandmother    Depression Paternal Grandmother    Hypertension Paternal Grandfather    Diabetes Brother         type 1 diabetes   Liver cancer Maternal Aunt    Prostate cancer Maternal Uncle    Colon cancer Neg Hx    Stomach cancer Neg Hx    Esophageal cancer Neg Hx    Pancreatic cancer Neg Hx     Social History Social History   Tobacco Use   Smoking status: Every Day    Packs/day: 0.20    Years: 8.00    Pack years: 1.60    Types: Cigarettes   Smokeless tobacco: Never  Vaping Use   Vaping Use: Never used  Substance Use Topics   Alcohol use: No   Drug use: No     Allergies   Norvasc [amlodipine], Nicotine, and Percocet [oxycodone-acetaminophen]   Review of Systems Review of Systems  As stated above in HPI Physical Exam Triage Vital Signs ED Triage Vitals  Enc Vitals Group     BP 09/21/20 1121 (!) 141/88     Pulse Rate 09/21/20 1121 80     Resp 09/21/20 1121 18     Temp 09/21/20 1121 98.6 F (37 C)     Temp src --      SpO2 09/21/20 1121 99 %     Weight --      Height --      Head Circumference --      Peak Flow --      Pain Score 09/21/20 1119 0     Pain Loc --      Pain Edu? --      Excl. in [place]? --    No data found.  Updated Vital Signs BP (!) 141/88 (BP Location: Right Arm)   Pulse 80   Temp 98.6 F (37 C)   Resp 18   SpO2 99%   Physical Exam Vitals and nursing note reviewed.  Constitutional:      Appearance: He is well-developed.  HENT:     Head: Normocephalic and atraumatic.  Musculoskeletal:     Cervical back: Normal range of motion and neck supple.  Skin:    Comments: There is a 1 inch soft round mobile mass of the left posterior partial scalp without tenderness   Neurological:     Mental Status: He is alert and oriented to person, place, and time.     UC Treatments / Results  Labs (all labs ordered are listed, but only abnormal results are displayed) Labs Reviewed - No data to display  EKG   Radiology No results found.  Procedures Procedures (including critical care time)  Medications Ordered in UC Medications - No data to  display  Initial Impression / Assessment and Plan / UC Course  I have reviewed the triage vital signs and the nursing notes.  Pertinent labs & imaging results  that were available during my care of the patient were reviewed by me and considered in my medical decision making (see chart for details).     New. Likely lipoma which I discussed with patient. It is where his hat hits and likely is getting irritated. Discussed that most lipomas are benign in nature however if this continues to give him troubles then I would recommend a general surgeon or dermatologist for removal.  We also did discuss that he has a bit of mild muscle tension on this side compared to the other and that paired with the lipoma and hat may be compressing a little bit of superficial nerves which could cause his discomfort or potential tension headache.  He does not have any worrisome symptoms for tumor especially given the nature of this mass however he will monitor.  Final Clinical Impressions(s) / UC Diagnoses   Final diagnoses:  None   Discharge Instructions   None    ED Prescriptions   None    PDMP not reviewed this encounter.   Hughie Closs, [place] 09/21/20 1205

## 2020-09-22 DIAGNOSIS — N186 End stage renal disease: Secondary | ICD-10-CM | POA: Diagnosis not present

## 2020-09-22 DIAGNOSIS — E440 Moderate protein-calorie malnutrition: Secondary | ICD-10-CM | POA: Diagnosis not present

## 2020-09-22 DIAGNOSIS — Z79899 Other long term (current) drug therapy: Secondary | ICD-10-CM | POA: Diagnosis not present

## 2020-09-22 DIAGNOSIS — N2581 Secondary hyperparathyroidism of renal origin: Secondary | ICD-10-CM | POA: Diagnosis not present

## 2020-09-22 DIAGNOSIS — D509 Iron deficiency anemia, unspecified: Secondary | ICD-10-CM | POA: Diagnosis not present

## 2020-09-22 DIAGNOSIS — E7841 Elevated Lipoprotein(a): Secondary | ICD-10-CM | POA: Diagnosis not present

## 2020-09-22 DIAGNOSIS — E878 Other disorders of electrolyte and fluid balance, not elsewhere classified: Secondary | ICD-10-CM | POA: Diagnosis not present

## 2020-09-22 DIAGNOSIS — D631 Anemia in chronic kidney disease: Secondary | ICD-10-CM | POA: Diagnosis not present

## 2020-09-22 DIAGNOSIS — N2589 Other disorders resulting from impaired renal tubular function: Secondary | ICD-10-CM | POA: Diagnosis not present

## 2020-09-22 DIAGNOSIS — K769 Liver disease, unspecified: Secondary | ICD-10-CM | POA: Diagnosis not present

## 2020-09-22 DIAGNOSIS — Z992 Dependence on renal dialysis: Secondary | ICD-10-CM | POA: Diagnosis not present

## 2020-09-23 DIAGNOSIS — Z992 Dependence on renal dialysis: Secondary | ICD-10-CM | POA: Diagnosis not present

## 2020-09-23 DIAGNOSIS — E7841 Elevated Lipoprotein(a): Secondary | ICD-10-CM | POA: Diagnosis not present

## 2020-09-23 DIAGNOSIS — D631 Anemia in chronic kidney disease: Secondary | ICD-10-CM | POA: Diagnosis not present

## 2020-09-23 DIAGNOSIS — N186 End stage renal disease: Secondary | ICD-10-CM | POA: Diagnosis not present

## 2020-09-23 DIAGNOSIS — N2581 Secondary hyperparathyroidism of renal origin: Secondary | ICD-10-CM | POA: Diagnosis not present

## 2020-09-23 DIAGNOSIS — Z79899 Other long term (current) drug therapy: Secondary | ICD-10-CM | POA: Diagnosis not present

## 2020-09-23 DIAGNOSIS — D509 Iron deficiency anemia, unspecified: Secondary | ICD-10-CM | POA: Diagnosis not present

## 2020-09-23 DIAGNOSIS — E440 Moderate protein-calorie malnutrition: Secondary | ICD-10-CM | POA: Diagnosis not present

## 2020-09-23 DIAGNOSIS — K769 Liver disease, unspecified: Secondary | ICD-10-CM | POA: Diagnosis not present

## 2020-09-23 DIAGNOSIS — N2589 Other disorders resulting from impaired renal tubular function: Secondary | ICD-10-CM | POA: Diagnosis not present

## 2020-09-23 DIAGNOSIS — E878 Other disorders of electrolyte and fluid balance, not elsewhere classified: Secondary | ICD-10-CM | POA: Diagnosis not present

## 2020-09-24 DIAGNOSIS — Z992 Dependence on renal dialysis: Secondary | ICD-10-CM | POA: Diagnosis not present

## 2020-09-24 DIAGNOSIS — E878 Other disorders of electrolyte and fluid balance, not elsewhere classified: Secondary | ICD-10-CM | POA: Diagnosis not present

## 2020-09-24 DIAGNOSIS — D631 Anemia in chronic kidney disease: Secondary | ICD-10-CM | POA: Diagnosis not present

## 2020-09-24 DIAGNOSIS — E7841 Elevated Lipoprotein(a): Secondary | ICD-10-CM | POA: Diagnosis not present

## 2020-09-24 DIAGNOSIS — Z79899 Other long term (current) drug therapy: Secondary | ICD-10-CM | POA: Diagnosis not present

## 2020-09-24 DIAGNOSIS — N2581 Secondary hyperparathyroidism of renal origin: Secondary | ICD-10-CM | POA: Diagnosis not present

## 2020-09-24 DIAGNOSIS — N186 End stage renal disease: Secondary | ICD-10-CM | POA: Diagnosis not present

## 2020-09-24 DIAGNOSIS — D509 Iron deficiency anemia, unspecified: Secondary | ICD-10-CM | POA: Diagnosis not present

## 2020-09-24 DIAGNOSIS — E440 Moderate protein-calorie malnutrition: Secondary | ICD-10-CM | POA: Diagnosis not present

## 2020-09-24 DIAGNOSIS — K769 Liver disease, unspecified: Secondary | ICD-10-CM | POA: Diagnosis not present

## 2020-09-24 DIAGNOSIS — N2589 Other disorders resulting from impaired renal tubular function: Secondary | ICD-10-CM | POA: Diagnosis not present

## 2020-09-25 DIAGNOSIS — D509 Iron deficiency anemia, unspecified: Secondary | ICD-10-CM | POA: Diagnosis not present

## 2020-09-25 DIAGNOSIS — N2581 Secondary hyperparathyroidism of renal origin: Secondary | ICD-10-CM | POA: Diagnosis not present

## 2020-09-25 DIAGNOSIS — E440 Moderate protein-calorie malnutrition: Secondary | ICD-10-CM | POA: Diagnosis not present

## 2020-09-25 DIAGNOSIS — Z79899 Other long term (current) drug therapy: Secondary | ICD-10-CM | POA: Diagnosis not present

## 2020-09-25 DIAGNOSIS — K769 Liver disease, unspecified: Secondary | ICD-10-CM | POA: Diagnosis not present

## 2020-09-25 DIAGNOSIS — N2589 Other disorders resulting from impaired renal tubular function: Secondary | ICD-10-CM | POA: Diagnosis not present

## 2020-09-25 DIAGNOSIS — E878 Other disorders of electrolyte and fluid balance, not elsewhere classified: Secondary | ICD-10-CM | POA: Diagnosis not present

## 2020-09-25 DIAGNOSIS — Z992 Dependence on renal dialysis: Secondary | ICD-10-CM | POA: Diagnosis not present

## 2020-09-25 DIAGNOSIS — D631 Anemia in chronic kidney disease: Secondary | ICD-10-CM | POA: Diagnosis not present

## 2020-09-25 DIAGNOSIS — E7841 Elevated Lipoprotein(a): Secondary | ICD-10-CM | POA: Diagnosis not present

## 2020-09-25 DIAGNOSIS — N186 End stage renal disease: Secondary | ICD-10-CM | POA: Diagnosis not present

## 2020-09-26 ENCOUNTER — Telehealth: Payer: Self-pay | Admitting: Family Medicine

## 2020-09-26 DIAGNOSIS — D509 Iron deficiency anemia, unspecified: Secondary | ICD-10-CM | POA: Diagnosis not present

## 2020-09-26 DIAGNOSIS — K769 Liver disease, unspecified: Secondary | ICD-10-CM | POA: Diagnosis not present

## 2020-09-26 DIAGNOSIS — N2581 Secondary hyperparathyroidism of renal origin: Secondary | ICD-10-CM | POA: Diagnosis not present

## 2020-09-26 DIAGNOSIS — N186 End stage renal disease: Secondary | ICD-10-CM | POA: Diagnosis not present

## 2020-09-26 DIAGNOSIS — D631 Anemia in chronic kidney disease: Secondary | ICD-10-CM | POA: Diagnosis not present

## 2020-09-26 DIAGNOSIS — E878 Other disorders of electrolyte and fluid balance, not elsewhere classified: Secondary | ICD-10-CM | POA: Diagnosis not present

## 2020-09-26 DIAGNOSIS — Z79899 Other long term (current) drug therapy: Secondary | ICD-10-CM | POA: Diagnosis not present

## 2020-09-26 DIAGNOSIS — N2589 Other disorders resulting from impaired renal tubular function: Secondary | ICD-10-CM | POA: Diagnosis not present

## 2020-09-26 DIAGNOSIS — E440 Moderate protein-calorie malnutrition: Secondary | ICD-10-CM | POA: Diagnosis not present

## 2020-09-26 DIAGNOSIS — Z992 Dependence on renal dialysis: Secondary | ICD-10-CM | POA: Diagnosis not present

## 2020-09-26 DIAGNOSIS — E7841 Elevated Lipoprotein(a): Secondary | ICD-10-CM | POA: Diagnosis not present

## 2020-09-26 NOTE — Telephone Encounter (Signed)
Left message for patient to call back and schedule Medicare Annual Wellness Visit (AWV) either virtually or in office.    awv-i per [place] 03/07/17  please schedule at anytime with [facility] Nurse Health Advisor 1 or 2   This should be a 45 minute visit.

## 2020-09-27 DIAGNOSIS — E7841 Elevated Lipoprotein(a): Secondary | ICD-10-CM | POA: Diagnosis not present

## 2020-09-27 DIAGNOSIS — D509 Iron deficiency anemia, unspecified: Secondary | ICD-10-CM | POA: Diagnosis not present

## 2020-09-27 DIAGNOSIS — E440 Moderate protein-calorie malnutrition: Secondary | ICD-10-CM | POA: Diagnosis not present

## 2020-09-27 DIAGNOSIS — E878 Other disorders of electrolyte and fluid balance, not elsewhere classified: Secondary | ICD-10-CM | POA: Diagnosis not present

## 2020-09-27 DIAGNOSIS — K769 Liver disease, unspecified: Secondary | ICD-10-CM | POA: Diagnosis not present

## 2020-09-27 DIAGNOSIS — Z0189 Encounter for other specified special examinations: Secondary | ICD-10-CM | POA: Diagnosis not present

## 2020-09-27 DIAGNOSIS — Z79899 Other long term (current) drug therapy: Secondary | ICD-10-CM | POA: Diagnosis not present

## 2020-09-27 DIAGNOSIS — N186 End stage renal disease: Secondary | ICD-10-CM | POA: Diagnosis not present

## 2020-09-27 DIAGNOSIS — Z992 Dependence on renal dialysis: Secondary | ICD-10-CM | POA: Diagnosis not present

## 2020-09-27 DIAGNOSIS — Z7682 Awaiting organ transplant status: Secondary | ICD-10-CM | POA: Diagnosis not present

## 2020-09-27 DIAGNOSIS — D631 Anemia in chronic kidney disease: Secondary | ICD-10-CM | POA: Diagnosis not present

## 2020-09-27 DIAGNOSIS — N2581 Secondary hyperparathyroidism of renal origin: Secondary | ICD-10-CM | POA: Diagnosis not present

## 2020-09-27 DIAGNOSIS — N2589 Other disorders resulting from impaired renal tubular function: Secondary | ICD-10-CM | POA: Diagnosis not present

## 2020-09-28 DIAGNOSIS — N186 End stage renal disease: Secondary | ICD-10-CM | POA: Diagnosis not present

## 2020-09-28 DIAGNOSIS — D509 Iron deficiency anemia, unspecified: Secondary | ICD-10-CM | POA: Diagnosis not present

## 2020-09-28 DIAGNOSIS — Z79899 Other long term (current) drug therapy: Secondary | ICD-10-CM | POA: Diagnosis not present

## 2020-09-28 DIAGNOSIS — N2581 Secondary hyperparathyroidism of renal origin: Secondary | ICD-10-CM | POA: Diagnosis not present

## 2020-09-28 DIAGNOSIS — E878 Other disorders of electrolyte and fluid balance, not elsewhere classified: Secondary | ICD-10-CM | POA: Diagnosis not present

## 2020-09-28 DIAGNOSIS — K769 Liver disease, unspecified: Secondary | ICD-10-CM | POA: Diagnosis not present

## 2020-09-28 DIAGNOSIS — D631 Anemia in chronic kidney disease: Secondary | ICD-10-CM | POA: Diagnosis not present

## 2020-09-28 DIAGNOSIS — N2589 Other disorders resulting from impaired renal tubular function: Secondary | ICD-10-CM | POA: Diagnosis not present

## 2020-09-28 DIAGNOSIS — Z992 Dependence on renal dialysis: Secondary | ICD-10-CM | POA: Diagnosis not present

## 2020-09-28 DIAGNOSIS — E440 Moderate protein-calorie malnutrition: Secondary | ICD-10-CM | POA: Diagnosis not present

## 2020-09-28 DIAGNOSIS — E7841 Elevated Lipoprotein(a): Secondary | ICD-10-CM | POA: Diagnosis not present

## 2020-09-29 DIAGNOSIS — N2589 Other disorders resulting from impaired renal tubular function: Secondary | ICD-10-CM | POA: Diagnosis not present

## 2020-09-29 DIAGNOSIS — N2581 Secondary hyperparathyroidism of renal origin: Secondary | ICD-10-CM | POA: Diagnosis not present

## 2020-09-29 DIAGNOSIS — D509 Iron deficiency anemia, unspecified: Secondary | ICD-10-CM | POA: Diagnosis not present

## 2020-09-29 DIAGNOSIS — Z992 Dependence on renal dialysis: Secondary | ICD-10-CM | POA: Diagnosis not present

## 2020-09-29 DIAGNOSIS — Z79899 Other long term (current) drug therapy: Secondary | ICD-10-CM | POA: Diagnosis not present

## 2020-09-29 DIAGNOSIS — E878 Other disorders of electrolyte and fluid balance, not elsewhere classified: Secondary | ICD-10-CM | POA: Diagnosis not present

## 2020-09-29 DIAGNOSIS — E440 Moderate protein-calorie malnutrition: Secondary | ICD-10-CM | POA: Diagnosis not present

## 2020-09-29 DIAGNOSIS — E7841 Elevated Lipoprotein(a): Secondary | ICD-10-CM | POA: Diagnosis not present

## 2020-09-29 DIAGNOSIS — K769 Liver disease, unspecified: Secondary | ICD-10-CM | POA: Diagnosis not present

## 2020-09-29 DIAGNOSIS — N186 End stage renal disease: Secondary | ICD-10-CM | POA: Diagnosis not present

## 2020-09-29 DIAGNOSIS — D631 Anemia in chronic kidney disease: Secondary | ICD-10-CM | POA: Diagnosis not present

## 2020-09-30 DIAGNOSIS — D509 Iron deficiency anemia, unspecified: Secondary | ICD-10-CM | POA: Diagnosis not present

## 2020-09-30 DIAGNOSIS — Z79899 Other long term (current) drug therapy: Secondary | ICD-10-CM | POA: Diagnosis not present

## 2020-09-30 DIAGNOSIS — N2589 Other disorders resulting from impaired renal tubular function: Secondary | ICD-10-CM | POA: Diagnosis not present

## 2020-09-30 DIAGNOSIS — D631 Anemia in chronic kidney disease: Secondary | ICD-10-CM | POA: Diagnosis not present

## 2020-09-30 DIAGNOSIS — K769 Liver disease, unspecified: Secondary | ICD-10-CM | POA: Diagnosis not present

## 2020-09-30 DIAGNOSIS — N186 End stage renal disease: Secondary | ICD-10-CM | POA: Diagnosis not present

## 2020-09-30 DIAGNOSIS — E7841 Elevated Lipoprotein(a): Secondary | ICD-10-CM | POA: Diagnosis not present

## 2020-09-30 DIAGNOSIS — E878 Other disorders of electrolyte and fluid balance, not elsewhere classified: Secondary | ICD-10-CM | POA: Diagnosis not present

## 2020-09-30 DIAGNOSIS — E440 Moderate protein-calorie malnutrition: Secondary | ICD-10-CM | POA: Diagnosis not present

## 2020-09-30 DIAGNOSIS — Z992 Dependence on renal dialysis: Secondary | ICD-10-CM | POA: Diagnosis not present

## 2020-09-30 DIAGNOSIS — N2581 Secondary hyperparathyroidism of renal origin: Secondary | ICD-10-CM | POA: Diagnosis not present

## 2020-10-01 DIAGNOSIS — Z79899 Other long term (current) drug therapy: Secondary | ICD-10-CM | POA: Diagnosis not present

## 2020-10-01 DIAGNOSIS — D509 Iron deficiency anemia, unspecified: Secondary | ICD-10-CM | POA: Diagnosis not present

## 2020-10-01 DIAGNOSIS — E7841 Elevated Lipoprotein(a): Secondary | ICD-10-CM | POA: Diagnosis not present

## 2020-10-01 DIAGNOSIS — D631 Anemia in chronic kidney disease: Secondary | ICD-10-CM | POA: Diagnosis not present

## 2020-10-01 DIAGNOSIS — N2589 Other disorders resulting from impaired renal tubular function: Secondary | ICD-10-CM | POA: Diagnosis not present

## 2020-10-01 DIAGNOSIS — E440 Moderate protein-calorie malnutrition: Secondary | ICD-10-CM | POA: Diagnosis not present

## 2020-10-01 DIAGNOSIS — E878 Other disorders of electrolyte and fluid balance, not elsewhere classified: Secondary | ICD-10-CM | POA: Diagnosis not present

## 2020-10-01 DIAGNOSIS — N186 End stage renal disease: Secondary | ICD-10-CM | POA: Diagnosis not present

## 2020-10-01 DIAGNOSIS — K769 Liver disease, unspecified: Secondary | ICD-10-CM | POA: Diagnosis not present

## 2020-10-01 DIAGNOSIS — N2581 Secondary hyperparathyroidism of renal origin: Secondary | ICD-10-CM | POA: Diagnosis not present

## 2020-10-01 DIAGNOSIS — Z992 Dependence on renal dialysis: Secondary | ICD-10-CM | POA: Diagnosis not present

## 2020-10-02 DIAGNOSIS — E7841 Elevated Lipoprotein(a): Secondary | ICD-10-CM | POA: Diagnosis not present

## 2020-10-02 DIAGNOSIS — D509 Iron deficiency anemia, unspecified: Secondary | ICD-10-CM | POA: Diagnosis not present

## 2020-10-02 DIAGNOSIS — Z992 Dependence on renal dialysis: Secondary | ICD-10-CM | POA: Diagnosis not present

## 2020-10-02 DIAGNOSIS — N186 End stage renal disease: Secondary | ICD-10-CM | POA: Diagnosis not present

## 2020-10-02 DIAGNOSIS — K769 Liver disease, unspecified: Secondary | ICD-10-CM | POA: Diagnosis not present

## 2020-10-02 DIAGNOSIS — E440 Moderate protein-calorie malnutrition: Secondary | ICD-10-CM | POA: Diagnosis not present

## 2020-10-02 DIAGNOSIS — N2589 Other disorders resulting from impaired renal tubular function: Secondary | ICD-10-CM | POA: Diagnosis not present

## 2020-10-02 DIAGNOSIS — E878 Other disorders of electrolyte and fluid balance, not elsewhere classified: Secondary | ICD-10-CM | POA: Diagnosis not present

## 2020-10-02 DIAGNOSIS — Z79899 Other long term (current) drug therapy: Secondary | ICD-10-CM | POA: Diagnosis not present

## 2020-10-02 DIAGNOSIS — N2581 Secondary hyperparathyroidism of renal origin: Secondary | ICD-10-CM | POA: Diagnosis not present

## 2020-10-02 DIAGNOSIS — D631 Anemia in chronic kidney disease: Secondary | ICD-10-CM | POA: Diagnosis not present

## 2020-10-03 DIAGNOSIS — N2589 Other disorders resulting from impaired renal tubular function: Secondary | ICD-10-CM | POA: Diagnosis not present

## 2020-10-03 DIAGNOSIS — E7841 Elevated Lipoprotein(a): Secondary | ICD-10-CM | POA: Diagnosis not present

## 2020-10-03 DIAGNOSIS — N2581 Secondary hyperparathyroidism of renal origin: Secondary | ICD-10-CM | POA: Diagnosis not present

## 2020-10-03 DIAGNOSIS — N186 End stage renal disease: Secondary | ICD-10-CM | POA: Diagnosis not present

## 2020-10-03 DIAGNOSIS — Z79899 Other long term (current) drug therapy: Secondary | ICD-10-CM | POA: Diagnosis not present

## 2020-10-03 DIAGNOSIS — Z992 Dependence on renal dialysis: Secondary | ICD-10-CM | POA: Diagnosis not present

## 2020-10-03 DIAGNOSIS — D509 Iron deficiency anemia, unspecified: Secondary | ICD-10-CM | POA: Diagnosis not present

## 2020-10-03 DIAGNOSIS — K769 Liver disease, unspecified: Secondary | ICD-10-CM | POA: Diagnosis not present

## 2020-10-03 DIAGNOSIS — E878 Other disorders of electrolyte and fluid balance, not elsewhere classified: Secondary | ICD-10-CM | POA: Diagnosis not present

## 2020-10-03 DIAGNOSIS — E440 Moderate protein-calorie malnutrition: Secondary | ICD-10-CM | POA: Diagnosis not present

## 2020-10-03 DIAGNOSIS — D631 Anemia in chronic kidney disease: Secondary | ICD-10-CM | POA: Diagnosis not present

## 2020-10-04 DIAGNOSIS — E440 Moderate protein-calorie malnutrition: Secondary | ICD-10-CM | POA: Diagnosis not present

## 2020-10-04 DIAGNOSIS — Z992 Dependence on renal dialysis: Secondary | ICD-10-CM | POA: Diagnosis not present

## 2020-10-04 DIAGNOSIS — N2581 Secondary hyperparathyroidism of renal origin: Secondary | ICD-10-CM | POA: Diagnosis not present

## 2020-10-04 DIAGNOSIS — D509 Iron deficiency anemia, unspecified: Secondary | ICD-10-CM | POA: Diagnosis not present

## 2020-10-04 DIAGNOSIS — K769 Liver disease, unspecified: Secondary | ICD-10-CM | POA: Diagnosis not present

## 2020-10-04 DIAGNOSIS — I129 Hypertensive chronic kidney disease with stage 1 through stage 4 chronic kidney disease, or unspecified chronic kidney disease: Secondary | ICD-10-CM | POA: Diagnosis not present

## 2020-10-04 DIAGNOSIS — D631 Anemia in chronic kidney disease: Secondary | ICD-10-CM | POA: Diagnosis not present

## 2020-10-04 DIAGNOSIS — E878 Other disorders of electrolyte and fluid balance, not elsewhere classified: Secondary | ICD-10-CM | POA: Diagnosis not present

## 2020-10-04 DIAGNOSIS — Z79899 Other long term (current) drug therapy: Secondary | ICD-10-CM | POA: Diagnosis not present

## 2020-10-04 DIAGNOSIS — N2589 Other disorders resulting from impaired renal tubular function: Secondary | ICD-10-CM | POA: Diagnosis not present

## 2020-10-04 DIAGNOSIS — E7841 Elevated Lipoprotein(a): Secondary | ICD-10-CM | POA: Diagnosis not present

## 2020-10-04 DIAGNOSIS — N186 End stage renal disease: Secondary | ICD-10-CM | POA: Diagnosis not present

## 2020-10-05 DIAGNOSIS — N2581 Secondary hyperparathyroidism of renal origin: Secondary | ICD-10-CM | POA: Diagnosis not present

## 2020-10-05 DIAGNOSIS — D509 Iron deficiency anemia, unspecified: Secondary | ICD-10-CM | POA: Diagnosis not present

## 2020-10-05 DIAGNOSIS — Z79899 Other long term (current) drug therapy: Secondary | ICD-10-CM | POA: Diagnosis not present

## 2020-10-05 DIAGNOSIS — N186 End stage renal disease: Secondary | ICD-10-CM | POA: Diagnosis not present

## 2020-10-05 DIAGNOSIS — K769 Liver disease, unspecified: Secondary | ICD-10-CM | POA: Diagnosis not present

## 2020-10-05 DIAGNOSIS — Z992 Dependence on renal dialysis: Secondary | ICD-10-CM | POA: Diagnosis not present

## 2020-10-05 DIAGNOSIS — E440 Moderate protein-calorie malnutrition: Secondary | ICD-10-CM | POA: Diagnosis not present

## 2020-10-05 DIAGNOSIS — D631 Anemia in chronic kidney disease: Secondary | ICD-10-CM | POA: Diagnosis not present

## 2020-10-05 DIAGNOSIS — R109 Unspecified abdominal pain: Secondary | ICD-10-CM | POA: Diagnosis not present

## 2020-10-05 DIAGNOSIS — E7841 Elevated Lipoprotein(a): Secondary | ICD-10-CM | POA: Diagnosis not present

## 2020-10-05 DIAGNOSIS — Z4932 Encounter for adequacy testing for peritoneal dialysis: Secondary | ICD-10-CM | POA: Diagnosis not present

## 2020-10-05 DIAGNOSIS — N2589 Other disorders resulting from impaired renal tubular function: Secondary | ICD-10-CM | POA: Diagnosis not present

## 2020-10-05 DIAGNOSIS — K650 Generalized (acute) peritonitis: Secondary | ICD-10-CM | POA: Diagnosis not present

## 2020-10-06 DIAGNOSIS — D631 Anemia in chronic kidney disease: Secondary | ICD-10-CM | POA: Diagnosis not present

## 2020-10-06 DIAGNOSIS — E7841 Elevated Lipoprotein(a): Secondary | ICD-10-CM | POA: Diagnosis not present

## 2020-10-06 DIAGNOSIS — Z992 Dependence on renal dialysis: Secondary | ICD-10-CM | POA: Diagnosis not present

## 2020-10-06 DIAGNOSIS — N2589 Other disorders resulting from impaired renal tubular function: Secondary | ICD-10-CM | POA: Diagnosis not present

## 2020-10-06 DIAGNOSIS — Z79899 Other long term (current) drug therapy: Secondary | ICD-10-CM | POA: Diagnosis not present

## 2020-10-06 DIAGNOSIS — N186 End stage renal disease: Secondary | ICD-10-CM | POA: Diagnosis not present

## 2020-10-06 DIAGNOSIS — N2581 Secondary hyperparathyroidism of renal origin: Secondary | ICD-10-CM | POA: Diagnosis not present

## 2020-10-06 DIAGNOSIS — K769 Liver disease, unspecified: Secondary | ICD-10-CM | POA: Diagnosis not present

## 2020-10-06 DIAGNOSIS — K650 Generalized (acute) peritonitis: Secondary | ICD-10-CM | POA: Diagnosis not present

## 2020-10-06 DIAGNOSIS — E440 Moderate protein-calorie malnutrition: Secondary | ICD-10-CM | POA: Diagnosis not present

## 2020-10-06 DIAGNOSIS — Z4932 Encounter for adequacy testing for peritoneal dialysis: Secondary | ICD-10-CM | POA: Diagnosis not present

## 2020-10-06 DIAGNOSIS — D509 Iron deficiency anemia, unspecified: Secondary | ICD-10-CM | POA: Diagnosis not present

## 2020-10-06 DIAGNOSIS — R109 Unspecified abdominal pain: Secondary | ICD-10-CM | POA: Diagnosis not present

## 2020-10-07 DIAGNOSIS — D631 Anemia in chronic kidney disease: Secondary | ICD-10-CM | POA: Diagnosis not present

## 2020-10-07 DIAGNOSIS — E440 Moderate protein-calorie malnutrition: Secondary | ICD-10-CM | POA: Diagnosis not present

## 2020-10-07 DIAGNOSIS — Z992 Dependence on renal dialysis: Secondary | ICD-10-CM | POA: Diagnosis not present

## 2020-10-07 DIAGNOSIS — K769 Liver disease, unspecified: Secondary | ICD-10-CM | POA: Diagnosis not present

## 2020-10-07 DIAGNOSIS — N186 End stage renal disease: Secondary | ICD-10-CM | POA: Diagnosis not present

## 2020-10-07 DIAGNOSIS — R109 Unspecified abdominal pain: Secondary | ICD-10-CM | POA: Diagnosis not present

## 2020-10-07 DIAGNOSIS — Z4932 Encounter for adequacy testing for peritoneal dialysis: Secondary | ICD-10-CM | POA: Diagnosis not present

## 2020-10-07 DIAGNOSIS — N2589 Other disorders resulting from impaired renal tubular function: Secondary | ICD-10-CM | POA: Diagnosis not present

## 2020-10-07 DIAGNOSIS — D509 Iron deficiency anemia, unspecified: Secondary | ICD-10-CM | POA: Diagnosis not present

## 2020-10-07 DIAGNOSIS — K650 Generalized (acute) peritonitis: Secondary | ICD-10-CM | POA: Diagnosis not present

## 2020-10-07 DIAGNOSIS — E7841 Elevated Lipoprotein(a): Secondary | ICD-10-CM | POA: Diagnosis not present

## 2020-10-07 DIAGNOSIS — N2581 Secondary hyperparathyroidism of renal origin: Secondary | ICD-10-CM | POA: Diagnosis not present

## 2020-10-07 DIAGNOSIS — Z79899 Other long term (current) drug therapy: Secondary | ICD-10-CM | POA: Diagnosis not present

## 2020-10-08 DIAGNOSIS — K769 Liver disease, unspecified: Secondary | ICD-10-CM | POA: Diagnosis not present

## 2020-10-08 DIAGNOSIS — N2581 Secondary hyperparathyroidism of renal origin: Secondary | ICD-10-CM | POA: Diagnosis not present

## 2020-10-08 DIAGNOSIS — Z79899 Other long term (current) drug therapy: Secondary | ICD-10-CM | POA: Diagnosis not present

## 2020-10-08 DIAGNOSIS — N186 End stage renal disease: Secondary | ICD-10-CM | POA: Diagnosis not present

## 2020-10-08 DIAGNOSIS — D509 Iron deficiency anemia, unspecified: Secondary | ICD-10-CM | POA: Diagnosis not present

## 2020-10-08 DIAGNOSIS — E7841 Elevated Lipoprotein(a): Secondary | ICD-10-CM | POA: Diagnosis not present

## 2020-10-08 DIAGNOSIS — E440 Moderate protein-calorie malnutrition: Secondary | ICD-10-CM | POA: Diagnosis not present

## 2020-10-08 DIAGNOSIS — D631 Anemia in chronic kidney disease: Secondary | ICD-10-CM | POA: Diagnosis not present

## 2020-10-08 DIAGNOSIS — K650 Generalized (acute) peritonitis: Secondary | ICD-10-CM | POA: Diagnosis not present

## 2020-10-08 DIAGNOSIS — Z4932 Encounter for adequacy testing for peritoneal dialysis: Secondary | ICD-10-CM | POA: Diagnosis not present

## 2020-10-08 DIAGNOSIS — R109 Unspecified abdominal pain: Secondary | ICD-10-CM | POA: Diagnosis not present

## 2020-10-08 DIAGNOSIS — N2589 Other disorders resulting from impaired renal tubular function: Secondary | ICD-10-CM | POA: Diagnosis not present

## 2020-10-08 DIAGNOSIS — Z992 Dependence on renal dialysis: Secondary | ICD-10-CM | POA: Diagnosis not present

## 2020-10-09 DIAGNOSIS — K769 Liver disease, unspecified: Secondary | ICD-10-CM | POA: Diagnosis not present

## 2020-10-09 DIAGNOSIS — R109 Unspecified abdominal pain: Secondary | ICD-10-CM | POA: Diagnosis not present

## 2020-10-09 DIAGNOSIS — N2581 Secondary hyperparathyroidism of renal origin: Secondary | ICD-10-CM | POA: Diagnosis not present

## 2020-10-09 DIAGNOSIS — E7841 Elevated Lipoprotein(a): Secondary | ICD-10-CM | POA: Diagnosis not present

## 2020-10-09 DIAGNOSIS — Z992 Dependence on renal dialysis: Secondary | ICD-10-CM | POA: Diagnosis not present

## 2020-10-09 DIAGNOSIS — Z79899 Other long term (current) drug therapy: Secondary | ICD-10-CM | POA: Diagnosis not present

## 2020-10-09 DIAGNOSIS — E440 Moderate protein-calorie malnutrition: Secondary | ICD-10-CM | POA: Diagnosis not present

## 2020-10-09 DIAGNOSIS — N2589 Other disorders resulting from impaired renal tubular function: Secondary | ICD-10-CM | POA: Diagnosis not present

## 2020-10-09 DIAGNOSIS — N186 End stage renal disease: Secondary | ICD-10-CM | POA: Diagnosis not present

## 2020-10-09 DIAGNOSIS — Z4932 Encounter for adequacy testing for peritoneal dialysis: Secondary | ICD-10-CM | POA: Diagnosis not present

## 2020-10-09 DIAGNOSIS — D509 Iron deficiency anemia, unspecified: Secondary | ICD-10-CM | POA: Diagnosis not present

## 2020-10-09 DIAGNOSIS — K650 Generalized (acute) peritonitis: Secondary | ICD-10-CM | POA: Diagnosis not present

## 2020-10-09 DIAGNOSIS — D631 Anemia in chronic kidney disease: Secondary | ICD-10-CM | POA: Diagnosis not present

## 2020-10-10 DIAGNOSIS — Z992 Dependence on renal dialysis: Secondary | ICD-10-CM | POA: Diagnosis not present

## 2020-10-10 DIAGNOSIS — K650 Generalized (acute) peritonitis: Secondary | ICD-10-CM | POA: Diagnosis not present

## 2020-10-10 DIAGNOSIS — N186 End stage renal disease: Secondary | ICD-10-CM | POA: Diagnosis not present

## 2020-10-10 DIAGNOSIS — N2589 Other disorders resulting from impaired renal tubular function: Secondary | ICD-10-CM | POA: Diagnosis not present

## 2020-10-10 DIAGNOSIS — K769 Liver disease, unspecified: Secondary | ICD-10-CM | POA: Diagnosis not present

## 2020-10-10 DIAGNOSIS — N2581 Secondary hyperparathyroidism of renal origin: Secondary | ICD-10-CM | POA: Diagnosis not present

## 2020-10-10 DIAGNOSIS — D631 Anemia in chronic kidney disease: Secondary | ICD-10-CM | POA: Diagnosis not present

## 2020-10-10 DIAGNOSIS — Z4932 Encounter for adequacy testing for peritoneal dialysis: Secondary | ICD-10-CM | POA: Diagnosis not present

## 2020-10-10 DIAGNOSIS — Z79899 Other long term (current) drug therapy: Secondary | ICD-10-CM | POA: Diagnosis not present

## 2020-10-10 DIAGNOSIS — E440 Moderate protein-calorie malnutrition: Secondary | ICD-10-CM | POA: Diagnosis not present

## 2020-10-10 DIAGNOSIS — D509 Iron deficiency anemia, unspecified: Secondary | ICD-10-CM | POA: Diagnosis not present

## 2020-10-10 DIAGNOSIS — E7841 Elevated Lipoprotein(a): Secondary | ICD-10-CM | POA: Diagnosis not present

## 2020-10-10 DIAGNOSIS — R109 Unspecified abdominal pain: Secondary | ICD-10-CM | POA: Diagnosis not present

## 2020-10-11 DIAGNOSIS — D509 Iron deficiency anemia, unspecified: Secondary | ICD-10-CM | POA: Diagnosis not present

## 2020-10-11 DIAGNOSIS — Z7682 Awaiting organ transplant status: Secondary | ICD-10-CM | POA: Diagnosis not present

## 2020-10-11 DIAGNOSIS — D631 Anemia in chronic kidney disease: Secondary | ICD-10-CM | POA: Diagnosis not present

## 2020-10-11 DIAGNOSIS — Z79899 Other long term (current) drug therapy: Secondary | ICD-10-CM | POA: Diagnosis not present

## 2020-10-11 DIAGNOSIS — N2589 Other disorders resulting from impaired renal tubular function: Secondary | ICD-10-CM | POA: Diagnosis not present

## 2020-10-11 DIAGNOSIS — R109 Unspecified abdominal pain: Secondary | ICD-10-CM | POA: Diagnosis not present

## 2020-10-11 DIAGNOSIS — Z4932 Encounter for adequacy testing for peritoneal dialysis: Secondary | ICD-10-CM | POA: Diagnosis not present

## 2020-10-11 DIAGNOSIS — N186 End stage renal disease: Secondary | ICD-10-CM | POA: Diagnosis not present

## 2020-10-11 DIAGNOSIS — N2581 Secondary hyperparathyroidism of renal origin: Secondary | ICD-10-CM | POA: Diagnosis not present

## 2020-10-11 DIAGNOSIS — E7841 Elevated Lipoprotein(a): Secondary | ICD-10-CM | POA: Diagnosis not present

## 2020-10-11 DIAGNOSIS — E440 Moderate protein-calorie malnutrition: Secondary | ICD-10-CM | POA: Diagnosis not present

## 2020-10-11 DIAGNOSIS — Z992 Dependence on renal dialysis: Secondary | ICD-10-CM | POA: Diagnosis not present

## 2020-10-11 DIAGNOSIS — K769 Liver disease, unspecified: Secondary | ICD-10-CM | POA: Diagnosis not present

## 2020-10-11 DIAGNOSIS — K650 Generalized (acute) peritonitis: Secondary | ICD-10-CM | POA: Diagnosis not present

## 2020-10-12 DIAGNOSIS — R109 Unspecified abdominal pain: Secondary | ICD-10-CM | POA: Diagnosis not present

## 2020-10-12 DIAGNOSIS — N186 End stage renal disease: Secondary | ICD-10-CM | POA: Diagnosis not present

## 2020-10-12 DIAGNOSIS — Z79899 Other long term (current) drug therapy: Secondary | ICD-10-CM | POA: Diagnosis not present

## 2020-10-12 DIAGNOSIS — E440 Moderate protein-calorie malnutrition: Secondary | ICD-10-CM | POA: Diagnosis not present

## 2020-10-12 DIAGNOSIS — D631 Anemia in chronic kidney disease: Secondary | ICD-10-CM | POA: Diagnosis not present

## 2020-10-12 DIAGNOSIS — E7841 Elevated Lipoprotein(a): Secondary | ICD-10-CM | POA: Diagnosis not present

## 2020-10-12 DIAGNOSIS — D509 Iron deficiency anemia, unspecified: Secondary | ICD-10-CM | POA: Diagnosis not present

## 2020-10-12 DIAGNOSIS — Z992 Dependence on renal dialysis: Secondary | ICD-10-CM | POA: Diagnosis not present

## 2020-10-12 DIAGNOSIS — K650 Generalized (acute) peritonitis: Secondary | ICD-10-CM | POA: Diagnosis not present

## 2020-10-12 DIAGNOSIS — N2589 Other disorders resulting from impaired renal tubular function: Secondary | ICD-10-CM | POA: Diagnosis not present

## 2020-10-12 DIAGNOSIS — K769 Liver disease, unspecified: Secondary | ICD-10-CM | POA: Diagnosis not present

## 2020-10-12 DIAGNOSIS — N2581 Secondary hyperparathyroidism of renal origin: Secondary | ICD-10-CM | POA: Diagnosis not present

## 2020-10-12 DIAGNOSIS — Z4932 Encounter for adequacy testing for peritoneal dialysis: Secondary | ICD-10-CM | POA: Diagnosis not present

## 2020-10-13 DIAGNOSIS — K769 Liver disease, unspecified: Secondary | ICD-10-CM | POA: Diagnosis not present

## 2020-10-13 DIAGNOSIS — R109 Unspecified abdominal pain: Secondary | ICD-10-CM | POA: Diagnosis not present

## 2020-10-13 DIAGNOSIS — K650 Generalized (acute) peritonitis: Secondary | ICD-10-CM | POA: Diagnosis not present

## 2020-10-13 DIAGNOSIS — N2581 Secondary hyperparathyroidism of renal origin: Secondary | ICD-10-CM | POA: Diagnosis not present

## 2020-10-13 DIAGNOSIS — Z4932 Encounter for adequacy testing for peritoneal dialysis: Secondary | ICD-10-CM | POA: Diagnosis not present

## 2020-10-13 DIAGNOSIS — E440 Moderate protein-calorie malnutrition: Secondary | ICD-10-CM | POA: Diagnosis not present

## 2020-10-13 DIAGNOSIS — N186 End stage renal disease: Secondary | ICD-10-CM | POA: Diagnosis not present

## 2020-10-13 DIAGNOSIS — N2589 Other disorders resulting from impaired renal tubular function: Secondary | ICD-10-CM | POA: Diagnosis not present

## 2020-10-13 DIAGNOSIS — E7841 Elevated Lipoprotein(a): Secondary | ICD-10-CM | POA: Diagnosis not present

## 2020-10-13 DIAGNOSIS — Z992 Dependence on renal dialysis: Secondary | ICD-10-CM | POA: Diagnosis not present

## 2020-10-13 DIAGNOSIS — D631 Anemia in chronic kidney disease: Secondary | ICD-10-CM | POA: Diagnosis not present

## 2020-10-13 DIAGNOSIS — Z79899 Other long term (current) drug therapy: Secondary | ICD-10-CM | POA: Diagnosis not present

## 2020-10-13 DIAGNOSIS — D509 Iron deficiency anemia, unspecified: Secondary | ICD-10-CM | POA: Diagnosis not present

## 2020-10-14 DIAGNOSIS — K650 Generalized (acute) peritonitis: Secondary | ICD-10-CM | POA: Diagnosis not present

## 2020-10-14 DIAGNOSIS — N2589 Other disorders resulting from impaired renal tubular function: Secondary | ICD-10-CM | POA: Diagnosis not present

## 2020-10-14 DIAGNOSIS — N2581 Secondary hyperparathyroidism of renal origin: Secondary | ICD-10-CM | POA: Diagnosis not present

## 2020-10-14 DIAGNOSIS — E440 Moderate protein-calorie malnutrition: Secondary | ICD-10-CM | POA: Diagnosis not present

## 2020-10-14 DIAGNOSIS — R109 Unspecified abdominal pain: Secondary | ICD-10-CM | POA: Diagnosis not present

## 2020-10-14 DIAGNOSIS — Z4932 Encounter for adequacy testing for peritoneal dialysis: Secondary | ICD-10-CM | POA: Diagnosis not present

## 2020-10-14 DIAGNOSIS — D509 Iron deficiency anemia, unspecified: Secondary | ICD-10-CM | POA: Diagnosis not present

## 2020-10-14 DIAGNOSIS — N186 End stage renal disease: Secondary | ICD-10-CM | POA: Diagnosis not present

## 2020-10-14 DIAGNOSIS — D631 Anemia in chronic kidney disease: Secondary | ICD-10-CM | POA: Diagnosis not present

## 2020-10-14 DIAGNOSIS — Z79899 Other long term (current) drug therapy: Secondary | ICD-10-CM | POA: Diagnosis not present

## 2020-10-14 DIAGNOSIS — E7841 Elevated Lipoprotein(a): Secondary | ICD-10-CM | POA: Diagnosis not present

## 2020-10-14 DIAGNOSIS — K769 Liver disease, unspecified: Secondary | ICD-10-CM | POA: Diagnosis not present

## 2020-10-14 DIAGNOSIS — Z992 Dependence on renal dialysis: Secondary | ICD-10-CM | POA: Diagnosis not present

## 2020-10-15 ENCOUNTER — Other Ambulatory Visit: Payer: Self-pay

## 2020-10-15 DIAGNOSIS — D509 Iron deficiency anemia, unspecified: Secondary | ICD-10-CM | POA: Diagnosis not present

## 2020-10-15 DIAGNOSIS — R109 Unspecified abdominal pain: Secondary | ICD-10-CM | POA: Diagnosis not present

## 2020-10-15 DIAGNOSIS — E7841 Elevated Lipoprotein(a): Secondary | ICD-10-CM | POA: Diagnosis not present

## 2020-10-15 DIAGNOSIS — Z4932 Encounter for adequacy testing for peritoneal dialysis: Secondary | ICD-10-CM | POA: Diagnosis not present

## 2020-10-15 DIAGNOSIS — E440 Moderate protein-calorie malnutrition: Secondary | ICD-10-CM | POA: Diagnosis not present

## 2020-10-15 DIAGNOSIS — K650 Generalized (acute) peritonitis: Secondary | ICD-10-CM | POA: Diagnosis not present

## 2020-10-15 DIAGNOSIS — N2589 Other disorders resulting from impaired renal tubular function: Secondary | ICD-10-CM | POA: Diagnosis not present

## 2020-10-15 DIAGNOSIS — N2581 Secondary hyperparathyroidism of renal origin: Secondary | ICD-10-CM | POA: Diagnosis not present

## 2020-10-15 DIAGNOSIS — Z992 Dependence on renal dialysis: Secondary | ICD-10-CM | POA: Diagnosis not present

## 2020-10-15 DIAGNOSIS — N186 End stage renal disease: Secondary | ICD-10-CM | POA: Diagnosis not present

## 2020-10-15 DIAGNOSIS — D631 Anemia in chronic kidney disease: Secondary | ICD-10-CM | POA: Diagnosis not present

## 2020-10-15 DIAGNOSIS — Z79899 Other long term (current) drug therapy: Secondary | ICD-10-CM | POA: Diagnosis not present

## 2020-10-15 DIAGNOSIS — K769 Liver disease, unspecified: Secondary | ICD-10-CM | POA: Diagnosis not present

## 2020-10-16 DIAGNOSIS — Z992 Dependence on renal dialysis: Secondary | ICD-10-CM | POA: Diagnosis not present

## 2020-10-16 DIAGNOSIS — D631 Anemia in chronic kidney disease: Secondary | ICD-10-CM | POA: Diagnosis not present

## 2020-10-16 DIAGNOSIS — R109 Unspecified abdominal pain: Secondary | ICD-10-CM | POA: Diagnosis not present

## 2020-10-16 DIAGNOSIS — N186 End stage renal disease: Secondary | ICD-10-CM | POA: Diagnosis not present

## 2020-10-16 DIAGNOSIS — K769 Liver disease, unspecified: Secondary | ICD-10-CM | POA: Diagnosis not present

## 2020-10-16 DIAGNOSIS — E7841 Elevated Lipoprotein(a): Secondary | ICD-10-CM | POA: Diagnosis not present

## 2020-10-16 DIAGNOSIS — N2589 Other disorders resulting from impaired renal tubular function: Secondary | ICD-10-CM | POA: Diagnosis not present

## 2020-10-16 DIAGNOSIS — Z4932 Encounter for adequacy testing for peritoneal dialysis: Secondary | ICD-10-CM | POA: Diagnosis not present

## 2020-10-16 DIAGNOSIS — D509 Iron deficiency anemia, unspecified: Secondary | ICD-10-CM | POA: Diagnosis not present

## 2020-10-16 DIAGNOSIS — K650 Generalized (acute) peritonitis: Secondary | ICD-10-CM | POA: Diagnosis not present

## 2020-10-16 DIAGNOSIS — Z79899 Other long term (current) drug therapy: Secondary | ICD-10-CM | POA: Diagnosis not present

## 2020-10-16 DIAGNOSIS — E440 Moderate protein-calorie malnutrition: Secondary | ICD-10-CM | POA: Diagnosis not present

## 2020-10-16 DIAGNOSIS — N2581 Secondary hyperparathyroidism of renal origin: Secondary | ICD-10-CM | POA: Diagnosis not present

## 2020-10-17 DIAGNOSIS — N2589 Other disorders resulting from impaired renal tubular function: Secondary | ICD-10-CM | POA: Diagnosis not present

## 2020-10-17 DIAGNOSIS — E7841 Elevated Lipoprotein(a): Secondary | ICD-10-CM | POA: Diagnosis not present

## 2020-10-17 DIAGNOSIS — Z79899 Other long term (current) drug therapy: Secondary | ICD-10-CM | POA: Diagnosis not present

## 2020-10-17 DIAGNOSIS — N186 End stage renal disease: Secondary | ICD-10-CM | POA: Diagnosis not present

## 2020-10-17 DIAGNOSIS — E440 Moderate protein-calorie malnutrition: Secondary | ICD-10-CM | POA: Diagnosis not present

## 2020-10-17 DIAGNOSIS — D509 Iron deficiency anemia, unspecified: Secondary | ICD-10-CM | POA: Diagnosis not present

## 2020-10-17 DIAGNOSIS — Z992 Dependence on renal dialysis: Secondary | ICD-10-CM | POA: Diagnosis not present

## 2020-10-17 DIAGNOSIS — D631 Anemia in chronic kidney disease: Secondary | ICD-10-CM | POA: Diagnosis not present

## 2020-10-17 DIAGNOSIS — Z4932 Encounter for adequacy testing for peritoneal dialysis: Secondary | ICD-10-CM | POA: Diagnosis not present

## 2020-10-17 DIAGNOSIS — K650 Generalized (acute) peritonitis: Secondary | ICD-10-CM | POA: Diagnosis not present

## 2020-10-17 DIAGNOSIS — K769 Liver disease, unspecified: Secondary | ICD-10-CM | POA: Diagnosis not present

## 2020-10-17 DIAGNOSIS — N2581 Secondary hyperparathyroidism of renal origin: Secondary | ICD-10-CM | POA: Diagnosis not present

## 2020-10-17 DIAGNOSIS — R109 Unspecified abdominal pain: Secondary | ICD-10-CM | POA: Diagnosis not present

## 2020-10-18 DIAGNOSIS — N186 End stage renal disease: Secondary | ICD-10-CM | POA: Diagnosis not present

## 2020-10-18 DIAGNOSIS — N2589 Other disorders resulting from impaired renal tubular function: Secondary | ICD-10-CM | POA: Diagnosis not present

## 2020-10-18 DIAGNOSIS — Z4932 Encounter for adequacy testing for peritoneal dialysis: Secondary | ICD-10-CM | POA: Diagnosis not present

## 2020-10-18 DIAGNOSIS — D631 Anemia in chronic kidney disease: Secondary | ICD-10-CM | POA: Diagnosis not present

## 2020-10-18 DIAGNOSIS — R109 Unspecified abdominal pain: Secondary | ICD-10-CM | POA: Diagnosis not present

## 2020-10-18 DIAGNOSIS — D509 Iron deficiency anemia, unspecified: Secondary | ICD-10-CM | POA: Diagnosis not present

## 2020-10-18 DIAGNOSIS — E7841 Elevated Lipoprotein(a): Secondary | ICD-10-CM | POA: Diagnosis not present

## 2020-10-18 DIAGNOSIS — Z79899 Other long term (current) drug therapy: Secondary | ICD-10-CM | POA: Diagnosis not present

## 2020-10-18 DIAGNOSIS — N2581 Secondary hyperparathyroidism of renal origin: Secondary | ICD-10-CM | POA: Diagnosis not present

## 2020-10-18 DIAGNOSIS — K650 Generalized (acute) peritonitis: Secondary | ICD-10-CM | POA: Diagnosis not present

## 2020-10-18 DIAGNOSIS — K769 Liver disease, unspecified: Secondary | ICD-10-CM | POA: Diagnosis not present

## 2020-10-18 DIAGNOSIS — E440 Moderate protein-calorie malnutrition: Secondary | ICD-10-CM | POA: Diagnosis not present

## 2020-10-18 DIAGNOSIS — Z992 Dependence on renal dialysis: Secondary | ICD-10-CM | POA: Diagnosis not present

## 2020-10-19 DIAGNOSIS — R109 Unspecified abdominal pain: Secondary | ICD-10-CM | POA: Diagnosis not present

## 2020-10-19 DIAGNOSIS — N2581 Secondary hyperparathyroidism of renal origin: Secondary | ICD-10-CM | POA: Diagnosis not present

## 2020-10-19 DIAGNOSIS — Z992 Dependence on renal dialysis: Secondary | ICD-10-CM | POA: Diagnosis not present

## 2020-10-19 DIAGNOSIS — N2589 Other disorders resulting from impaired renal tubular function: Secondary | ICD-10-CM | POA: Diagnosis not present

## 2020-10-19 DIAGNOSIS — E7841 Elevated Lipoprotein(a): Secondary | ICD-10-CM | POA: Diagnosis not present

## 2020-10-19 DIAGNOSIS — K650 Generalized (acute) peritonitis: Secondary | ICD-10-CM | POA: Diagnosis not present

## 2020-10-19 DIAGNOSIS — K769 Liver disease, unspecified: Secondary | ICD-10-CM | POA: Diagnosis not present

## 2020-10-19 DIAGNOSIS — D509 Iron deficiency anemia, unspecified: Secondary | ICD-10-CM | POA: Diagnosis not present

## 2020-10-19 DIAGNOSIS — Z79899 Other long term (current) drug therapy: Secondary | ICD-10-CM | POA: Diagnosis not present

## 2020-10-19 DIAGNOSIS — N186 End stage renal disease: Secondary | ICD-10-CM | POA: Diagnosis not present

## 2020-10-19 DIAGNOSIS — Z4932 Encounter for adequacy testing for peritoneal dialysis: Secondary | ICD-10-CM | POA: Diagnosis not present

## 2020-10-19 DIAGNOSIS — E440 Moderate protein-calorie malnutrition: Secondary | ICD-10-CM | POA: Diagnosis not present

## 2020-10-19 DIAGNOSIS — D631 Anemia in chronic kidney disease: Secondary | ICD-10-CM | POA: Diagnosis not present

## 2020-10-20 DIAGNOSIS — R109 Unspecified abdominal pain: Secondary | ICD-10-CM | POA: Diagnosis not present

## 2020-10-20 DIAGNOSIS — E440 Moderate protein-calorie malnutrition: Secondary | ICD-10-CM | POA: Diagnosis not present

## 2020-10-20 DIAGNOSIS — E7841 Elevated Lipoprotein(a): Secondary | ICD-10-CM | POA: Diagnosis not present

## 2020-10-20 DIAGNOSIS — D631 Anemia in chronic kidney disease: Secondary | ICD-10-CM | POA: Diagnosis not present

## 2020-10-20 DIAGNOSIS — N2581 Secondary hyperparathyroidism of renal origin: Secondary | ICD-10-CM | POA: Diagnosis not present

## 2020-10-20 DIAGNOSIS — K650 Generalized (acute) peritonitis: Secondary | ICD-10-CM | POA: Diagnosis not present

## 2020-10-20 DIAGNOSIS — K769 Liver disease, unspecified: Secondary | ICD-10-CM | POA: Diagnosis not present

## 2020-10-20 DIAGNOSIS — Z992 Dependence on renal dialysis: Secondary | ICD-10-CM | POA: Diagnosis not present

## 2020-10-20 DIAGNOSIS — Z4932 Encounter for adequacy testing for peritoneal dialysis: Secondary | ICD-10-CM | POA: Diagnosis not present

## 2020-10-20 DIAGNOSIS — Z79899 Other long term (current) drug therapy: Secondary | ICD-10-CM | POA: Diagnosis not present

## 2020-10-20 DIAGNOSIS — D509 Iron deficiency anemia, unspecified: Secondary | ICD-10-CM | POA: Diagnosis not present

## 2020-10-20 DIAGNOSIS — N2589 Other disorders resulting from impaired renal tubular function: Secondary | ICD-10-CM | POA: Diagnosis not present

## 2020-10-20 DIAGNOSIS — N186 End stage renal disease: Secondary | ICD-10-CM | POA: Diagnosis not present

## 2020-10-21 DIAGNOSIS — K650 Generalized (acute) peritonitis: Secondary | ICD-10-CM | POA: Diagnosis not present

## 2020-10-21 DIAGNOSIS — Z79899 Other long term (current) drug therapy: Secondary | ICD-10-CM | POA: Diagnosis not present

## 2020-10-21 DIAGNOSIS — D631 Anemia in chronic kidney disease: Secondary | ICD-10-CM | POA: Diagnosis not present

## 2020-10-21 DIAGNOSIS — Z4932 Encounter for adequacy testing for peritoneal dialysis: Secondary | ICD-10-CM | POA: Diagnosis not present

## 2020-10-21 DIAGNOSIS — K769 Liver disease, unspecified: Secondary | ICD-10-CM | POA: Diagnosis not present

## 2020-10-21 DIAGNOSIS — D509 Iron deficiency anemia, unspecified: Secondary | ICD-10-CM | POA: Diagnosis not present

## 2020-10-21 DIAGNOSIS — N2581 Secondary hyperparathyroidism of renal origin: Secondary | ICD-10-CM | POA: Diagnosis not present

## 2020-10-21 DIAGNOSIS — Z992 Dependence on renal dialysis: Secondary | ICD-10-CM | POA: Diagnosis not present

## 2020-10-21 DIAGNOSIS — N186 End stage renal disease: Secondary | ICD-10-CM | POA: Diagnosis not present

## 2020-10-21 DIAGNOSIS — N2589 Other disorders resulting from impaired renal tubular function: Secondary | ICD-10-CM | POA: Diagnosis not present

## 2020-10-21 DIAGNOSIS — E7841 Elevated Lipoprotein(a): Secondary | ICD-10-CM | POA: Diagnosis not present

## 2020-10-21 DIAGNOSIS — R109 Unspecified abdominal pain: Secondary | ICD-10-CM | POA: Diagnosis not present

## 2020-10-21 DIAGNOSIS — E440 Moderate protein-calorie malnutrition: Secondary | ICD-10-CM | POA: Diagnosis not present

## 2020-10-22 DIAGNOSIS — N186 End stage renal disease: Secondary | ICD-10-CM | POA: Diagnosis not present

## 2020-10-22 DIAGNOSIS — N2581 Secondary hyperparathyroidism of renal origin: Secondary | ICD-10-CM | POA: Diagnosis not present

## 2020-10-22 DIAGNOSIS — K650 Generalized (acute) peritonitis: Secondary | ICD-10-CM | POA: Diagnosis not present

## 2020-10-22 DIAGNOSIS — D509 Iron deficiency anemia, unspecified: Secondary | ICD-10-CM | POA: Diagnosis not present

## 2020-10-22 DIAGNOSIS — Z992 Dependence on renal dialysis: Secondary | ICD-10-CM | POA: Diagnosis not present

## 2020-10-22 DIAGNOSIS — D631 Anemia in chronic kidney disease: Secondary | ICD-10-CM | POA: Diagnosis not present

## 2020-10-22 DIAGNOSIS — R109 Unspecified abdominal pain: Secondary | ICD-10-CM | POA: Diagnosis not present

## 2020-10-22 DIAGNOSIS — E7841 Elevated Lipoprotein(a): Secondary | ICD-10-CM | POA: Diagnosis not present

## 2020-10-22 DIAGNOSIS — N2589 Other disorders resulting from impaired renal tubular function: Secondary | ICD-10-CM | POA: Diagnosis not present

## 2020-10-22 DIAGNOSIS — Z4932 Encounter for adequacy testing for peritoneal dialysis: Secondary | ICD-10-CM | POA: Diagnosis not present

## 2020-10-22 DIAGNOSIS — K769 Liver disease, unspecified: Secondary | ICD-10-CM | POA: Diagnosis not present

## 2020-10-22 DIAGNOSIS — Z79899 Other long term (current) drug therapy: Secondary | ICD-10-CM | POA: Diagnosis not present

## 2020-10-22 DIAGNOSIS — E440 Moderate protein-calorie malnutrition: Secondary | ICD-10-CM | POA: Diagnosis not present

## 2020-10-23 DIAGNOSIS — N2589 Other disorders resulting from impaired renal tubular function: Secondary | ICD-10-CM | POA: Diagnosis not present

## 2020-10-23 DIAGNOSIS — R109 Unspecified abdominal pain: Secondary | ICD-10-CM | POA: Diagnosis not present

## 2020-10-23 DIAGNOSIS — N2581 Secondary hyperparathyroidism of renal origin: Secondary | ICD-10-CM | POA: Diagnosis not present

## 2020-10-23 DIAGNOSIS — Z992 Dependence on renal dialysis: Secondary | ICD-10-CM | POA: Diagnosis not present

## 2020-10-23 DIAGNOSIS — D509 Iron deficiency anemia, unspecified: Secondary | ICD-10-CM | POA: Diagnosis not present

## 2020-10-23 DIAGNOSIS — E7841 Elevated Lipoprotein(a): Secondary | ICD-10-CM | POA: Diagnosis not present

## 2020-10-23 DIAGNOSIS — E440 Moderate protein-calorie malnutrition: Secondary | ICD-10-CM | POA: Diagnosis not present

## 2020-10-23 DIAGNOSIS — K769 Liver disease, unspecified: Secondary | ICD-10-CM | POA: Diagnosis not present

## 2020-10-23 DIAGNOSIS — Z79899 Other long term (current) drug therapy: Secondary | ICD-10-CM | POA: Diagnosis not present

## 2020-10-23 DIAGNOSIS — D631 Anemia in chronic kidney disease: Secondary | ICD-10-CM | POA: Diagnosis not present

## 2020-10-23 DIAGNOSIS — Z4932 Encounter for adequacy testing for peritoneal dialysis: Secondary | ICD-10-CM | POA: Diagnosis not present

## 2020-10-23 DIAGNOSIS — N186 End stage renal disease: Secondary | ICD-10-CM | POA: Diagnosis not present

## 2020-10-23 DIAGNOSIS — K650 Generalized (acute) peritonitis: Secondary | ICD-10-CM | POA: Diagnosis not present

## 2020-10-24 DIAGNOSIS — R109 Unspecified abdominal pain: Secondary | ICD-10-CM | POA: Diagnosis not present

## 2020-10-24 DIAGNOSIS — D631 Anemia in chronic kidney disease: Secondary | ICD-10-CM | POA: Diagnosis not present

## 2020-10-24 DIAGNOSIS — K769 Liver disease, unspecified: Secondary | ICD-10-CM | POA: Diagnosis not present

## 2020-10-24 DIAGNOSIS — E440 Moderate protein-calorie malnutrition: Secondary | ICD-10-CM | POA: Diagnosis not present

## 2020-10-24 DIAGNOSIS — Z4932 Encounter for adequacy testing for peritoneal dialysis: Secondary | ICD-10-CM | POA: Diagnosis not present

## 2020-10-24 DIAGNOSIS — N2589 Other disorders resulting from impaired renal tubular function: Secondary | ICD-10-CM | POA: Diagnosis not present

## 2020-10-24 DIAGNOSIS — Z79899 Other long term (current) drug therapy: Secondary | ICD-10-CM | POA: Diagnosis not present

## 2020-10-24 DIAGNOSIS — Z992 Dependence on renal dialysis: Secondary | ICD-10-CM | POA: Diagnosis not present

## 2020-10-24 DIAGNOSIS — N2581 Secondary hyperparathyroidism of renal origin: Secondary | ICD-10-CM | POA: Diagnosis not present

## 2020-10-24 DIAGNOSIS — D509 Iron deficiency anemia, unspecified: Secondary | ICD-10-CM | POA: Diagnosis not present

## 2020-10-24 DIAGNOSIS — K650 Generalized (acute) peritonitis: Secondary | ICD-10-CM | POA: Diagnosis not present

## 2020-10-24 DIAGNOSIS — E7841 Elevated Lipoprotein(a): Secondary | ICD-10-CM | POA: Diagnosis not present

## 2020-10-24 DIAGNOSIS — N186 End stage renal disease: Secondary | ICD-10-CM | POA: Diagnosis not present

## 2020-10-25 DIAGNOSIS — E7841 Elevated Lipoprotein(a): Secondary | ICD-10-CM | POA: Diagnosis not present

## 2020-10-25 DIAGNOSIS — N2589 Other disorders resulting from impaired renal tubular function: Secondary | ICD-10-CM | POA: Diagnosis not present

## 2020-10-25 DIAGNOSIS — K769 Liver disease, unspecified: Secondary | ICD-10-CM | POA: Diagnosis not present

## 2020-10-25 DIAGNOSIS — D631 Anemia in chronic kidney disease: Secondary | ICD-10-CM | POA: Diagnosis not present

## 2020-10-25 DIAGNOSIS — Z4932 Encounter for adequacy testing for peritoneal dialysis: Secondary | ICD-10-CM | POA: Diagnosis not present

## 2020-10-25 DIAGNOSIS — K650 Generalized (acute) peritonitis: Secondary | ICD-10-CM | POA: Diagnosis not present

## 2020-10-25 DIAGNOSIS — N2581 Secondary hyperparathyroidism of renal origin: Secondary | ICD-10-CM | POA: Diagnosis not present

## 2020-10-25 DIAGNOSIS — D509 Iron deficiency anemia, unspecified: Secondary | ICD-10-CM | POA: Diagnosis not present

## 2020-10-25 DIAGNOSIS — E440 Moderate protein-calorie malnutrition: Secondary | ICD-10-CM | POA: Diagnosis not present

## 2020-10-25 DIAGNOSIS — N186 End stage renal disease: Secondary | ICD-10-CM | POA: Diagnosis not present

## 2020-10-25 DIAGNOSIS — R109 Unspecified abdominal pain: Secondary | ICD-10-CM | POA: Diagnosis not present

## 2020-10-25 DIAGNOSIS — Z79899 Other long term (current) drug therapy: Secondary | ICD-10-CM | POA: Diagnosis not present

## 2020-10-25 DIAGNOSIS — Z992 Dependence on renal dialysis: Secondary | ICD-10-CM | POA: Diagnosis not present

## 2020-10-26 DIAGNOSIS — R109 Unspecified abdominal pain: Secondary | ICD-10-CM | POA: Diagnosis not present

## 2020-10-26 DIAGNOSIS — N2589 Other disorders resulting from impaired renal tubular function: Secondary | ICD-10-CM | POA: Diagnosis not present

## 2020-10-26 DIAGNOSIS — N186 End stage renal disease: Secondary | ICD-10-CM | POA: Diagnosis not present

## 2020-10-26 DIAGNOSIS — Z79899 Other long term (current) drug therapy: Secondary | ICD-10-CM | POA: Diagnosis not present

## 2020-10-26 DIAGNOSIS — D509 Iron deficiency anemia, unspecified: Secondary | ICD-10-CM | POA: Diagnosis not present

## 2020-10-26 DIAGNOSIS — E7841 Elevated Lipoprotein(a): Secondary | ICD-10-CM | POA: Diagnosis not present

## 2020-10-26 DIAGNOSIS — K650 Generalized (acute) peritonitis: Secondary | ICD-10-CM | POA: Diagnosis not present

## 2020-10-26 DIAGNOSIS — E440 Moderate protein-calorie malnutrition: Secondary | ICD-10-CM | POA: Diagnosis not present

## 2020-10-26 DIAGNOSIS — Z4932 Encounter for adequacy testing for peritoneal dialysis: Secondary | ICD-10-CM | POA: Diagnosis not present

## 2020-10-26 DIAGNOSIS — Z992 Dependence on renal dialysis: Secondary | ICD-10-CM | POA: Diagnosis not present

## 2020-10-26 DIAGNOSIS — N2581 Secondary hyperparathyroidism of renal origin: Secondary | ICD-10-CM | POA: Diagnosis not present

## 2020-10-26 DIAGNOSIS — K769 Liver disease, unspecified: Secondary | ICD-10-CM | POA: Diagnosis not present

## 2020-10-26 DIAGNOSIS — D631 Anemia in chronic kidney disease: Secondary | ICD-10-CM | POA: Diagnosis not present

## 2020-10-27 DIAGNOSIS — Z4932 Encounter for adequacy testing for peritoneal dialysis: Secondary | ICD-10-CM | POA: Diagnosis not present

## 2020-10-27 DIAGNOSIS — N2581 Secondary hyperparathyroidism of renal origin: Secondary | ICD-10-CM | POA: Diagnosis not present

## 2020-10-27 DIAGNOSIS — N2589 Other disorders resulting from impaired renal tubular function: Secondary | ICD-10-CM | POA: Diagnosis not present

## 2020-10-27 DIAGNOSIS — K769 Liver disease, unspecified: Secondary | ICD-10-CM | POA: Diagnosis not present

## 2020-10-27 DIAGNOSIS — K650 Generalized (acute) peritonitis: Secondary | ICD-10-CM | POA: Diagnosis not present

## 2020-10-27 DIAGNOSIS — D509 Iron deficiency anemia, unspecified: Secondary | ICD-10-CM | POA: Diagnosis not present

## 2020-10-27 DIAGNOSIS — Z992 Dependence on renal dialysis: Secondary | ICD-10-CM | POA: Diagnosis not present

## 2020-10-27 DIAGNOSIS — E440 Moderate protein-calorie malnutrition: Secondary | ICD-10-CM | POA: Diagnosis not present

## 2020-10-27 DIAGNOSIS — R109 Unspecified abdominal pain: Secondary | ICD-10-CM | POA: Diagnosis not present

## 2020-10-27 DIAGNOSIS — D631 Anemia in chronic kidney disease: Secondary | ICD-10-CM | POA: Diagnosis not present

## 2020-10-27 DIAGNOSIS — N186 End stage renal disease: Secondary | ICD-10-CM | POA: Diagnosis not present

## 2020-10-27 DIAGNOSIS — Z79899 Other long term (current) drug therapy: Secondary | ICD-10-CM | POA: Diagnosis not present

## 2020-10-27 DIAGNOSIS — E7841 Elevated Lipoprotein(a): Secondary | ICD-10-CM | POA: Diagnosis not present

## 2020-10-28 DIAGNOSIS — N2581 Secondary hyperparathyroidism of renal origin: Secondary | ICD-10-CM | POA: Diagnosis not present

## 2020-10-28 DIAGNOSIS — D509 Iron deficiency anemia, unspecified: Secondary | ICD-10-CM | POA: Diagnosis not present

## 2020-10-28 DIAGNOSIS — E7841 Elevated Lipoprotein(a): Secondary | ICD-10-CM | POA: Diagnosis not present

## 2020-10-28 DIAGNOSIS — Z79899 Other long term (current) drug therapy: Secondary | ICD-10-CM | POA: Diagnosis not present

## 2020-10-28 DIAGNOSIS — K650 Generalized (acute) peritonitis: Secondary | ICD-10-CM | POA: Diagnosis not present

## 2020-10-28 DIAGNOSIS — K769 Liver disease, unspecified: Secondary | ICD-10-CM | POA: Diagnosis not present

## 2020-10-28 DIAGNOSIS — D631 Anemia in chronic kidney disease: Secondary | ICD-10-CM | POA: Diagnosis not present

## 2020-10-28 DIAGNOSIS — N186 End stage renal disease: Secondary | ICD-10-CM | POA: Diagnosis not present

## 2020-10-28 DIAGNOSIS — Z992 Dependence on renal dialysis: Secondary | ICD-10-CM | POA: Diagnosis not present

## 2020-10-28 DIAGNOSIS — R109 Unspecified abdominal pain: Secondary | ICD-10-CM | POA: Diagnosis not present

## 2020-10-28 DIAGNOSIS — E440 Moderate protein-calorie malnutrition: Secondary | ICD-10-CM | POA: Diagnosis not present

## 2020-10-28 DIAGNOSIS — N2589 Other disorders resulting from impaired renal tubular function: Secondary | ICD-10-CM | POA: Diagnosis not present

## 2020-10-28 DIAGNOSIS — Z4932 Encounter for adequacy testing for peritoneal dialysis: Secondary | ICD-10-CM | POA: Diagnosis not present

## 2020-10-29 ENCOUNTER — Telehealth: Payer: Self-pay | Admitting: Family Medicine

## 2020-10-29 DIAGNOSIS — Z992 Dependence on renal dialysis: Secondary | ICD-10-CM | POA: Diagnosis not present

## 2020-10-29 DIAGNOSIS — Z79899 Other long term (current) drug therapy: Secondary | ICD-10-CM | POA: Diagnosis not present

## 2020-10-29 DIAGNOSIS — N2589 Other disorders resulting from impaired renal tubular function: Secondary | ICD-10-CM | POA: Diagnosis not present

## 2020-10-29 DIAGNOSIS — K650 Generalized (acute) peritonitis: Secondary | ICD-10-CM | POA: Diagnosis not present

## 2020-10-29 DIAGNOSIS — E440 Moderate protein-calorie malnutrition: Secondary | ICD-10-CM | POA: Diagnosis not present

## 2020-10-29 DIAGNOSIS — R109 Unspecified abdominal pain: Secondary | ICD-10-CM | POA: Diagnosis not present

## 2020-10-29 DIAGNOSIS — D509 Iron deficiency anemia, unspecified: Secondary | ICD-10-CM | POA: Diagnosis not present

## 2020-10-29 DIAGNOSIS — Z4932 Encounter for adequacy testing for peritoneal dialysis: Secondary | ICD-10-CM | POA: Diagnosis not present

## 2020-10-29 DIAGNOSIS — N186 End stage renal disease: Secondary | ICD-10-CM | POA: Diagnosis not present

## 2020-10-29 DIAGNOSIS — E7841 Elevated Lipoprotein(a): Secondary | ICD-10-CM | POA: Diagnosis not present

## 2020-10-29 DIAGNOSIS — N2581 Secondary hyperparathyroidism of renal origin: Secondary | ICD-10-CM | POA: Diagnosis not present

## 2020-10-29 DIAGNOSIS — K769 Liver disease, unspecified: Secondary | ICD-10-CM | POA: Diagnosis not present

## 2020-10-29 DIAGNOSIS — D631 Anemia in chronic kidney disease: Secondary | ICD-10-CM | POA: Diagnosis not present

## 2020-10-29 NOTE — Telephone Encounter (Signed)
Left message for patient to call back and schedule Medicare Annual Wellness Visit (AWV) either virtually or in office.    awv-i per [place] 03/07/17  please schedule at anytime with [facility] Nurse Health Advisor 1 or 2   This should be a 45 minute visit.

## 2020-10-30 DIAGNOSIS — E7841 Elevated Lipoprotein(a): Secondary | ICD-10-CM | POA: Diagnosis not present

## 2020-10-30 DIAGNOSIS — N2581 Secondary hyperparathyroidism of renal origin: Secondary | ICD-10-CM | POA: Diagnosis not present

## 2020-10-30 DIAGNOSIS — R109 Unspecified abdominal pain: Secondary | ICD-10-CM | POA: Diagnosis not present

## 2020-10-30 DIAGNOSIS — K650 Generalized (acute) peritonitis: Secondary | ICD-10-CM | POA: Diagnosis not present

## 2020-10-30 DIAGNOSIS — K769 Liver disease, unspecified: Secondary | ICD-10-CM | POA: Diagnosis not present

## 2020-10-30 DIAGNOSIS — D509 Iron deficiency anemia, unspecified: Secondary | ICD-10-CM | POA: Diagnosis not present

## 2020-10-30 DIAGNOSIS — N2589 Other disorders resulting from impaired renal tubular function: Secondary | ICD-10-CM | POA: Diagnosis not present

## 2020-10-30 DIAGNOSIS — N186 End stage renal disease: Secondary | ICD-10-CM | POA: Diagnosis not present

## 2020-10-30 DIAGNOSIS — Z4932 Encounter for adequacy testing for peritoneal dialysis: Secondary | ICD-10-CM | POA: Diagnosis not present

## 2020-10-30 DIAGNOSIS — D631 Anemia in chronic kidney disease: Secondary | ICD-10-CM | POA: Diagnosis not present

## 2020-10-30 DIAGNOSIS — Z79899 Other long term (current) drug therapy: Secondary | ICD-10-CM | POA: Diagnosis not present

## 2020-10-30 DIAGNOSIS — E440 Moderate protein-calorie malnutrition: Secondary | ICD-10-CM | POA: Diagnosis not present

## 2020-10-30 DIAGNOSIS — Z992 Dependence on renal dialysis: Secondary | ICD-10-CM | POA: Diagnosis not present

## 2020-10-31 DIAGNOSIS — N186 End stage renal disease: Secondary | ICD-10-CM | POA: Diagnosis not present

## 2020-10-31 DIAGNOSIS — D509 Iron deficiency anemia, unspecified: Secondary | ICD-10-CM | POA: Diagnosis not present

## 2020-10-31 DIAGNOSIS — N2589 Other disorders resulting from impaired renal tubular function: Secondary | ICD-10-CM | POA: Diagnosis not present

## 2020-10-31 DIAGNOSIS — Z4932 Encounter for adequacy testing for peritoneal dialysis: Secondary | ICD-10-CM | POA: Diagnosis not present

## 2020-10-31 DIAGNOSIS — K650 Generalized (acute) peritonitis: Secondary | ICD-10-CM | POA: Diagnosis not present

## 2020-10-31 DIAGNOSIS — K769 Liver disease, unspecified: Secondary | ICD-10-CM | POA: Diagnosis not present

## 2020-10-31 DIAGNOSIS — N2581 Secondary hyperparathyroidism of renal origin: Secondary | ICD-10-CM | POA: Diagnosis not present

## 2020-10-31 DIAGNOSIS — Z79899 Other long term (current) drug therapy: Secondary | ICD-10-CM | POA: Diagnosis not present

## 2020-10-31 DIAGNOSIS — Z992 Dependence on renal dialysis: Secondary | ICD-10-CM | POA: Diagnosis not present

## 2020-10-31 DIAGNOSIS — D631 Anemia in chronic kidney disease: Secondary | ICD-10-CM | POA: Diagnosis not present

## 2020-10-31 DIAGNOSIS — E440 Moderate protein-calorie malnutrition: Secondary | ICD-10-CM | POA: Diagnosis not present

## 2020-10-31 DIAGNOSIS — E7841 Elevated Lipoprotein(a): Secondary | ICD-10-CM | POA: Diagnosis not present

## 2020-10-31 DIAGNOSIS — R109 Unspecified abdominal pain: Secondary | ICD-10-CM | POA: Diagnosis not present

## 2020-11-01 DIAGNOSIS — Z79899 Other long term (current) drug therapy: Secondary | ICD-10-CM | POA: Diagnosis not present

## 2020-11-01 DIAGNOSIS — Z4932 Encounter for adequacy testing for peritoneal dialysis: Secondary | ICD-10-CM | POA: Diagnosis not present

## 2020-11-01 DIAGNOSIS — K769 Liver disease, unspecified: Secondary | ICD-10-CM | POA: Diagnosis not present

## 2020-11-01 DIAGNOSIS — R109 Unspecified abdominal pain: Secondary | ICD-10-CM | POA: Diagnosis not present

## 2020-11-01 DIAGNOSIS — N2589 Other disorders resulting from impaired renal tubular function: Secondary | ICD-10-CM | POA: Diagnosis not present

## 2020-11-01 DIAGNOSIS — N186 End stage renal disease: Secondary | ICD-10-CM | POA: Diagnosis not present

## 2020-11-01 DIAGNOSIS — Z992 Dependence on renal dialysis: Secondary | ICD-10-CM | POA: Diagnosis not present

## 2020-11-01 DIAGNOSIS — N2581 Secondary hyperparathyroidism of renal origin: Secondary | ICD-10-CM | POA: Diagnosis not present

## 2020-11-01 DIAGNOSIS — D509 Iron deficiency anemia, unspecified: Secondary | ICD-10-CM | POA: Diagnosis not present

## 2020-11-01 DIAGNOSIS — E440 Moderate protein-calorie malnutrition: Secondary | ICD-10-CM | POA: Diagnosis not present

## 2020-11-01 DIAGNOSIS — K650 Generalized (acute) peritonitis: Secondary | ICD-10-CM | POA: Diagnosis not present

## 2020-11-01 DIAGNOSIS — D631 Anemia in chronic kidney disease: Secondary | ICD-10-CM | POA: Diagnosis not present

## 2020-11-01 DIAGNOSIS — E7841 Elevated Lipoprotein(a): Secondary | ICD-10-CM | POA: Diagnosis not present

## 2020-11-02 DIAGNOSIS — E7841 Elevated Lipoprotein(a): Secondary | ICD-10-CM | POA: Diagnosis not present

## 2020-11-02 DIAGNOSIS — D631 Anemia in chronic kidney disease: Secondary | ICD-10-CM | POA: Diagnosis not present

## 2020-11-02 DIAGNOSIS — D509 Iron deficiency anemia, unspecified: Secondary | ICD-10-CM | POA: Diagnosis not present

## 2020-11-02 DIAGNOSIS — Z79899 Other long term (current) drug therapy: Secondary | ICD-10-CM | POA: Diagnosis not present

## 2020-11-02 DIAGNOSIS — R109 Unspecified abdominal pain: Secondary | ICD-10-CM | POA: Diagnosis not present

## 2020-11-02 DIAGNOSIS — N186 End stage renal disease: Secondary | ICD-10-CM | POA: Diagnosis not present

## 2020-11-02 DIAGNOSIS — K650 Generalized (acute) peritonitis: Secondary | ICD-10-CM | POA: Diagnosis not present

## 2020-11-02 DIAGNOSIS — N2581 Secondary hyperparathyroidism of renal origin: Secondary | ICD-10-CM | POA: Diagnosis not present

## 2020-11-02 DIAGNOSIS — E440 Moderate protein-calorie malnutrition: Secondary | ICD-10-CM | POA: Diagnosis not present

## 2020-11-02 DIAGNOSIS — K769 Liver disease, unspecified: Secondary | ICD-10-CM | POA: Diagnosis not present

## 2020-11-02 DIAGNOSIS — N2589 Other disorders resulting from impaired renal tubular function: Secondary | ICD-10-CM | POA: Diagnosis not present

## 2020-11-02 DIAGNOSIS — Z4932 Encounter for adequacy testing for peritoneal dialysis: Secondary | ICD-10-CM | POA: Diagnosis not present

## 2020-11-02 DIAGNOSIS — Z992 Dependence on renal dialysis: Secondary | ICD-10-CM | POA: Diagnosis not present

## 2020-11-03 DIAGNOSIS — Z79899 Other long term (current) drug therapy: Secondary | ICD-10-CM | POA: Diagnosis not present

## 2020-11-03 DIAGNOSIS — K650 Generalized (acute) peritonitis: Secondary | ICD-10-CM | POA: Diagnosis not present

## 2020-11-03 DIAGNOSIS — E7841 Elevated Lipoprotein(a): Secondary | ICD-10-CM | POA: Diagnosis not present

## 2020-11-03 DIAGNOSIS — K769 Liver disease, unspecified: Secondary | ICD-10-CM | POA: Diagnosis not present

## 2020-11-03 DIAGNOSIS — D631 Anemia in chronic kidney disease: Secondary | ICD-10-CM | POA: Diagnosis not present

## 2020-11-03 DIAGNOSIS — R109 Unspecified abdominal pain: Secondary | ICD-10-CM | POA: Diagnosis not present

## 2020-11-03 DIAGNOSIS — N186 End stage renal disease: Secondary | ICD-10-CM | POA: Diagnosis not present

## 2020-11-03 DIAGNOSIS — D509 Iron deficiency anemia, unspecified: Secondary | ICD-10-CM | POA: Diagnosis not present

## 2020-11-03 DIAGNOSIS — N2589 Other disorders resulting from impaired renal tubular function: Secondary | ICD-10-CM | POA: Diagnosis not present

## 2020-11-03 DIAGNOSIS — E440 Moderate protein-calorie malnutrition: Secondary | ICD-10-CM | POA: Diagnosis not present

## 2020-11-03 DIAGNOSIS — Z4932 Encounter for adequacy testing for peritoneal dialysis: Secondary | ICD-10-CM | POA: Diagnosis not present

## 2020-11-03 DIAGNOSIS — Z992 Dependence on renal dialysis: Secondary | ICD-10-CM | POA: Diagnosis not present

## 2020-11-03 DIAGNOSIS — N2581 Secondary hyperparathyroidism of renal origin: Secondary | ICD-10-CM | POA: Diagnosis not present

## 2020-11-04 DIAGNOSIS — Z4932 Encounter for adequacy testing for peritoneal dialysis: Secondary | ICD-10-CM | POA: Diagnosis not present

## 2020-11-04 DIAGNOSIS — N2581 Secondary hyperparathyroidism of renal origin: Secondary | ICD-10-CM | POA: Diagnosis not present

## 2020-11-04 DIAGNOSIS — K769 Liver disease, unspecified: Secondary | ICD-10-CM | POA: Diagnosis not present

## 2020-11-04 DIAGNOSIS — I129 Hypertensive chronic kidney disease with stage 1 through stage 4 chronic kidney disease, or unspecified chronic kidney disease: Secondary | ICD-10-CM | POA: Diagnosis not present

## 2020-11-04 DIAGNOSIS — R109 Unspecified abdominal pain: Secondary | ICD-10-CM | POA: Diagnosis not present

## 2020-11-04 DIAGNOSIS — E440 Moderate protein-calorie malnutrition: Secondary | ICD-10-CM | POA: Diagnosis not present

## 2020-11-04 DIAGNOSIS — N186 End stage renal disease: Secondary | ICD-10-CM | POA: Diagnosis not present

## 2020-11-04 DIAGNOSIS — D509 Iron deficiency anemia, unspecified: Secondary | ICD-10-CM | POA: Diagnosis not present

## 2020-11-04 DIAGNOSIS — E7841 Elevated Lipoprotein(a): Secondary | ICD-10-CM | POA: Diagnosis not present

## 2020-11-04 DIAGNOSIS — K650 Generalized (acute) peritonitis: Secondary | ICD-10-CM | POA: Diagnosis not present

## 2020-11-04 DIAGNOSIS — Z79899 Other long term (current) drug therapy: Secondary | ICD-10-CM | POA: Diagnosis not present

## 2020-11-04 DIAGNOSIS — D631 Anemia in chronic kidney disease: Secondary | ICD-10-CM | POA: Diagnosis not present

## 2020-11-04 DIAGNOSIS — N2589 Other disorders resulting from impaired renal tubular function: Secondary | ICD-10-CM | POA: Diagnosis not present

## 2020-11-04 DIAGNOSIS — Z992 Dependence on renal dialysis: Secondary | ICD-10-CM | POA: Diagnosis not present

## 2020-11-05 DIAGNOSIS — K650 Generalized (acute) peritonitis: Secondary | ICD-10-CM | POA: Diagnosis not present

## 2020-11-05 DIAGNOSIS — N186 End stage renal disease: Secondary | ICD-10-CM | POA: Diagnosis not present

## 2020-11-05 DIAGNOSIS — N2581 Secondary hyperparathyroidism of renal origin: Secondary | ICD-10-CM | POA: Diagnosis not present

## 2020-11-05 DIAGNOSIS — Z992 Dependence on renal dialysis: Secondary | ICD-10-CM | POA: Diagnosis not present

## 2020-11-05 DIAGNOSIS — D631 Anemia in chronic kidney disease: Secondary | ICD-10-CM | POA: Diagnosis not present

## 2020-11-05 DIAGNOSIS — Z79899 Other long term (current) drug therapy: Secondary | ICD-10-CM | POA: Diagnosis not present

## 2020-11-05 DIAGNOSIS — E440 Moderate protein-calorie malnutrition: Secondary | ICD-10-CM | POA: Diagnosis not present

## 2020-11-05 DIAGNOSIS — N2589 Other disorders resulting from impaired renal tubular function: Secondary | ICD-10-CM | POA: Diagnosis not present

## 2020-11-05 DIAGNOSIS — D509 Iron deficiency anemia, unspecified: Secondary | ICD-10-CM | POA: Diagnosis not present

## 2020-11-05 DIAGNOSIS — E878 Other disorders of electrolyte and fluid balance, not elsewhere classified: Secondary | ICD-10-CM | POA: Diagnosis not present

## 2020-11-05 DIAGNOSIS — E7841 Elevated Lipoprotein(a): Secondary | ICD-10-CM | POA: Diagnosis not present

## 2020-11-05 DIAGNOSIS — K769 Liver disease, unspecified: Secondary | ICD-10-CM | POA: Diagnosis not present

## 2020-11-06 DIAGNOSIS — N2581 Secondary hyperparathyroidism of renal origin: Secondary | ICD-10-CM | POA: Diagnosis not present

## 2020-11-06 DIAGNOSIS — Z79899 Other long term (current) drug therapy: Secondary | ICD-10-CM | POA: Diagnosis not present

## 2020-11-06 DIAGNOSIS — N2589 Other disorders resulting from impaired renal tubular function: Secondary | ICD-10-CM | POA: Diagnosis not present

## 2020-11-06 DIAGNOSIS — D509 Iron deficiency anemia, unspecified: Secondary | ICD-10-CM | POA: Diagnosis not present

## 2020-11-06 DIAGNOSIS — E878 Other disorders of electrolyte and fluid balance, not elsewhere classified: Secondary | ICD-10-CM | POA: Diagnosis not present

## 2020-11-06 DIAGNOSIS — Z992 Dependence on renal dialysis: Secondary | ICD-10-CM | POA: Diagnosis not present

## 2020-11-06 DIAGNOSIS — K650 Generalized (acute) peritonitis: Secondary | ICD-10-CM | POA: Diagnosis not present

## 2020-11-06 DIAGNOSIS — E440 Moderate protein-calorie malnutrition: Secondary | ICD-10-CM | POA: Diagnosis not present

## 2020-11-06 DIAGNOSIS — N186 End stage renal disease: Secondary | ICD-10-CM | POA: Diagnosis not present

## 2020-11-06 DIAGNOSIS — K769 Liver disease, unspecified: Secondary | ICD-10-CM | POA: Diagnosis not present

## 2020-11-06 DIAGNOSIS — D631 Anemia in chronic kidney disease: Secondary | ICD-10-CM | POA: Diagnosis not present

## 2020-11-06 DIAGNOSIS — E7841 Elevated Lipoprotein(a): Secondary | ICD-10-CM | POA: Diagnosis not present

## 2020-11-07 DIAGNOSIS — N186 End stage renal disease: Secondary | ICD-10-CM | POA: Diagnosis not present

## 2020-11-07 DIAGNOSIS — Z992 Dependence on renal dialysis: Secondary | ICD-10-CM | POA: Diagnosis not present

## 2020-11-07 DIAGNOSIS — N2589 Other disorders resulting from impaired renal tubular function: Secondary | ICD-10-CM | POA: Diagnosis not present

## 2020-11-07 DIAGNOSIS — D509 Iron deficiency anemia, unspecified: Secondary | ICD-10-CM | POA: Diagnosis not present

## 2020-11-07 DIAGNOSIS — N2581 Secondary hyperparathyroidism of renal origin: Secondary | ICD-10-CM | POA: Diagnosis not present

## 2020-11-07 DIAGNOSIS — E440 Moderate protein-calorie malnutrition: Secondary | ICD-10-CM | POA: Diagnosis not present

## 2020-11-07 DIAGNOSIS — Z79899 Other long term (current) drug therapy: Secondary | ICD-10-CM | POA: Diagnosis not present

## 2020-11-07 DIAGNOSIS — E7841 Elevated Lipoprotein(a): Secondary | ICD-10-CM | POA: Diagnosis not present

## 2020-11-07 DIAGNOSIS — D631 Anemia in chronic kidney disease: Secondary | ICD-10-CM | POA: Diagnosis not present

## 2020-11-07 DIAGNOSIS — K650 Generalized (acute) peritonitis: Secondary | ICD-10-CM | POA: Diagnosis not present

## 2020-11-07 DIAGNOSIS — E878 Other disorders of electrolyte and fluid balance, not elsewhere classified: Secondary | ICD-10-CM | POA: Diagnosis not present

## 2020-11-07 DIAGNOSIS — K769 Liver disease, unspecified: Secondary | ICD-10-CM | POA: Diagnosis not present

## 2020-11-08 DIAGNOSIS — E7841 Elevated Lipoprotein(a): Secondary | ICD-10-CM | POA: Diagnosis not present

## 2020-11-08 DIAGNOSIS — D631 Anemia in chronic kidney disease: Secondary | ICD-10-CM | POA: Diagnosis not present

## 2020-11-08 DIAGNOSIS — D509 Iron deficiency anemia, unspecified: Secondary | ICD-10-CM | POA: Diagnosis not present

## 2020-11-08 DIAGNOSIS — K650 Generalized (acute) peritonitis: Secondary | ICD-10-CM | POA: Diagnosis not present

## 2020-11-08 DIAGNOSIS — N186 End stage renal disease: Secondary | ICD-10-CM | POA: Diagnosis not present

## 2020-11-08 DIAGNOSIS — Z79899 Other long term (current) drug therapy: Secondary | ICD-10-CM | POA: Diagnosis not present

## 2020-11-08 DIAGNOSIS — N2589 Other disorders resulting from impaired renal tubular function: Secondary | ICD-10-CM | POA: Diagnosis not present

## 2020-11-08 DIAGNOSIS — K769 Liver disease, unspecified: Secondary | ICD-10-CM | POA: Diagnosis not present

## 2020-11-08 DIAGNOSIS — N2581 Secondary hyperparathyroidism of renal origin: Secondary | ICD-10-CM | POA: Diagnosis not present

## 2020-11-08 DIAGNOSIS — E440 Moderate protein-calorie malnutrition: Secondary | ICD-10-CM | POA: Diagnosis not present

## 2020-11-08 DIAGNOSIS — Z992 Dependence on renal dialysis: Secondary | ICD-10-CM | POA: Diagnosis not present

## 2020-11-08 DIAGNOSIS — E878 Other disorders of electrolyte and fluid balance, not elsewhere classified: Secondary | ICD-10-CM | POA: Diagnosis not present

## 2020-11-09 DIAGNOSIS — N2589 Other disorders resulting from impaired renal tubular function: Secondary | ICD-10-CM | POA: Diagnosis not present

## 2020-11-09 DIAGNOSIS — Z79899 Other long term (current) drug therapy: Secondary | ICD-10-CM | POA: Diagnosis not present

## 2020-11-09 DIAGNOSIS — N186 End stage renal disease: Secondary | ICD-10-CM | POA: Diagnosis not present

## 2020-11-09 DIAGNOSIS — E7841 Elevated Lipoprotein(a): Secondary | ICD-10-CM | POA: Diagnosis not present

## 2020-11-09 DIAGNOSIS — D631 Anemia in chronic kidney disease: Secondary | ICD-10-CM | POA: Diagnosis not present

## 2020-11-09 DIAGNOSIS — N2581 Secondary hyperparathyroidism of renal origin: Secondary | ICD-10-CM | POA: Diagnosis not present

## 2020-11-09 DIAGNOSIS — Z992 Dependence on renal dialysis: Secondary | ICD-10-CM | POA: Diagnosis not present

## 2020-11-09 DIAGNOSIS — K769 Liver disease, unspecified: Secondary | ICD-10-CM | POA: Diagnosis not present

## 2020-11-09 DIAGNOSIS — D509 Iron deficiency anemia, unspecified: Secondary | ICD-10-CM | POA: Diagnosis not present

## 2020-11-09 DIAGNOSIS — E878 Other disorders of electrolyte and fluid balance, not elsewhere classified: Secondary | ICD-10-CM | POA: Diagnosis not present

## 2020-11-09 DIAGNOSIS — K650 Generalized (acute) peritonitis: Secondary | ICD-10-CM | POA: Diagnosis not present

## 2020-11-09 DIAGNOSIS — E440 Moderate protein-calorie malnutrition: Secondary | ICD-10-CM | POA: Diagnosis not present

## 2020-11-10 DIAGNOSIS — K650 Generalized (acute) peritonitis: Secondary | ICD-10-CM | POA: Diagnosis not present

## 2020-11-10 DIAGNOSIS — E440 Moderate protein-calorie malnutrition: Secondary | ICD-10-CM | POA: Diagnosis not present

## 2020-11-10 DIAGNOSIS — K769 Liver disease, unspecified: Secondary | ICD-10-CM | POA: Diagnosis not present

## 2020-11-10 DIAGNOSIS — Z992 Dependence on renal dialysis: Secondary | ICD-10-CM | POA: Diagnosis not present

## 2020-11-10 DIAGNOSIS — N2589 Other disorders resulting from impaired renal tubular function: Secondary | ICD-10-CM | POA: Diagnosis not present

## 2020-11-10 DIAGNOSIS — D509 Iron deficiency anemia, unspecified: Secondary | ICD-10-CM | POA: Diagnosis not present

## 2020-11-10 DIAGNOSIS — Z79899 Other long term (current) drug therapy: Secondary | ICD-10-CM | POA: Diagnosis not present

## 2020-11-10 DIAGNOSIS — N2581 Secondary hyperparathyroidism of renal origin: Secondary | ICD-10-CM | POA: Diagnosis not present

## 2020-11-10 DIAGNOSIS — N186 End stage renal disease: Secondary | ICD-10-CM | POA: Diagnosis not present

## 2020-11-10 DIAGNOSIS — E878 Other disorders of electrolyte and fluid balance, not elsewhere classified: Secondary | ICD-10-CM | POA: Diagnosis not present

## 2020-11-10 DIAGNOSIS — D631 Anemia in chronic kidney disease: Secondary | ICD-10-CM | POA: Diagnosis not present

## 2020-11-10 DIAGNOSIS — E7841 Elevated Lipoprotein(a): Secondary | ICD-10-CM | POA: Diagnosis not present

## 2020-11-11 DIAGNOSIS — N186 End stage renal disease: Secondary | ICD-10-CM | POA: Diagnosis not present

## 2020-11-11 DIAGNOSIS — K769 Liver disease, unspecified: Secondary | ICD-10-CM | POA: Diagnosis not present

## 2020-11-11 DIAGNOSIS — E878 Other disorders of electrolyte and fluid balance, not elsewhere classified: Secondary | ICD-10-CM | POA: Diagnosis not present

## 2020-11-11 DIAGNOSIS — K650 Generalized (acute) peritonitis: Secondary | ICD-10-CM | POA: Diagnosis not present

## 2020-11-11 DIAGNOSIS — N2589 Other disorders resulting from impaired renal tubular function: Secondary | ICD-10-CM | POA: Diagnosis not present

## 2020-11-11 DIAGNOSIS — E440 Moderate protein-calorie malnutrition: Secondary | ICD-10-CM | POA: Diagnosis not present

## 2020-11-11 DIAGNOSIS — D631 Anemia in chronic kidney disease: Secondary | ICD-10-CM | POA: Diagnosis not present

## 2020-11-11 DIAGNOSIS — Z79899 Other long term (current) drug therapy: Secondary | ICD-10-CM | POA: Diagnosis not present

## 2020-11-11 DIAGNOSIS — N2581 Secondary hyperparathyroidism of renal origin: Secondary | ICD-10-CM | POA: Diagnosis not present

## 2020-11-11 DIAGNOSIS — D509 Iron deficiency anemia, unspecified: Secondary | ICD-10-CM | POA: Diagnosis not present

## 2020-11-11 DIAGNOSIS — E7841 Elevated Lipoprotein(a): Secondary | ICD-10-CM | POA: Diagnosis not present

## 2020-11-11 DIAGNOSIS — Z992 Dependence on renal dialysis: Secondary | ICD-10-CM | POA: Diagnosis not present

## 2020-11-12 DIAGNOSIS — N186 End stage renal disease: Secondary | ICD-10-CM | POA: Diagnosis not present

## 2020-11-12 DIAGNOSIS — K650 Generalized (acute) peritonitis: Secondary | ICD-10-CM | POA: Diagnosis not present

## 2020-11-12 DIAGNOSIS — E878 Other disorders of electrolyte and fluid balance, not elsewhere classified: Secondary | ICD-10-CM | POA: Diagnosis not present

## 2020-11-12 DIAGNOSIS — Z79899 Other long term (current) drug therapy: Secondary | ICD-10-CM | POA: Diagnosis not present

## 2020-11-12 DIAGNOSIS — N2589 Other disorders resulting from impaired renal tubular function: Secondary | ICD-10-CM | POA: Diagnosis not present

## 2020-11-12 DIAGNOSIS — E440 Moderate protein-calorie malnutrition: Secondary | ICD-10-CM | POA: Diagnosis not present

## 2020-11-12 DIAGNOSIS — N2581 Secondary hyperparathyroidism of renal origin: Secondary | ICD-10-CM | POA: Diagnosis not present

## 2020-11-12 DIAGNOSIS — D631 Anemia in chronic kidney disease: Secondary | ICD-10-CM | POA: Diagnosis not present

## 2020-11-12 DIAGNOSIS — D509 Iron deficiency anemia, unspecified: Secondary | ICD-10-CM | POA: Diagnosis not present

## 2020-11-12 DIAGNOSIS — E7841 Elevated Lipoprotein(a): Secondary | ICD-10-CM | POA: Diagnosis not present

## 2020-11-12 DIAGNOSIS — K769 Liver disease, unspecified: Secondary | ICD-10-CM | POA: Diagnosis not present

## 2020-11-12 DIAGNOSIS — Z992 Dependence on renal dialysis: Secondary | ICD-10-CM | POA: Diagnosis not present

## 2020-11-13 DIAGNOSIS — E878 Other disorders of electrolyte and fluid balance, not elsewhere classified: Secondary | ICD-10-CM | POA: Diagnosis not present

## 2020-11-13 DIAGNOSIS — K650 Generalized (acute) peritonitis: Secondary | ICD-10-CM | POA: Diagnosis not present

## 2020-11-13 DIAGNOSIS — K769 Liver disease, unspecified: Secondary | ICD-10-CM | POA: Diagnosis not present

## 2020-11-13 DIAGNOSIS — E440 Moderate protein-calorie malnutrition: Secondary | ICD-10-CM | POA: Diagnosis not present

## 2020-11-13 DIAGNOSIS — E7841 Elevated Lipoprotein(a): Secondary | ICD-10-CM | POA: Diagnosis not present

## 2020-11-13 DIAGNOSIS — D631 Anemia in chronic kidney disease: Secondary | ICD-10-CM | POA: Diagnosis not present

## 2020-11-13 DIAGNOSIS — N186 End stage renal disease: Secondary | ICD-10-CM | POA: Diagnosis not present

## 2020-11-13 DIAGNOSIS — N2581 Secondary hyperparathyroidism of renal origin: Secondary | ICD-10-CM | POA: Diagnosis not present

## 2020-11-13 DIAGNOSIS — Z79899 Other long term (current) drug therapy: Secondary | ICD-10-CM | POA: Diagnosis not present

## 2020-11-13 DIAGNOSIS — N2589 Other disorders resulting from impaired renal tubular function: Secondary | ICD-10-CM | POA: Diagnosis not present

## 2020-11-13 DIAGNOSIS — Z992 Dependence on renal dialysis: Secondary | ICD-10-CM | POA: Diagnosis not present

## 2020-11-13 DIAGNOSIS — D509 Iron deficiency anemia, unspecified: Secondary | ICD-10-CM | POA: Diagnosis not present

## 2020-11-14 DIAGNOSIS — N2581 Secondary hyperparathyroidism of renal origin: Secondary | ICD-10-CM | POA: Diagnosis not present

## 2020-11-14 DIAGNOSIS — D509 Iron deficiency anemia, unspecified: Secondary | ICD-10-CM | POA: Diagnosis not present

## 2020-11-14 DIAGNOSIS — D631 Anemia in chronic kidney disease: Secondary | ICD-10-CM | POA: Diagnosis not present

## 2020-11-14 DIAGNOSIS — N186 End stage renal disease: Secondary | ICD-10-CM | POA: Diagnosis not present

## 2020-11-14 DIAGNOSIS — Z79899 Other long term (current) drug therapy: Secondary | ICD-10-CM | POA: Diagnosis not present

## 2020-11-14 DIAGNOSIS — N2589 Other disorders resulting from impaired renal tubular function: Secondary | ICD-10-CM | POA: Diagnosis not present

## 2020-11-14 DIAGNOSIS — E440 Moderate protein-calorie malnutrition: Secondary | ICD-10-CM | POA: Diagnosis not present

## 2020-11-14 DIAGNOSIS — K769 Liver disease, unspecified: Secondary | ICD-10-CM | POA: Diagnosis not present

## 2020-11-14 DIAGNOSIS — K650 Generalized (acute) peritonitis: Secondary | ICD-10-CM | POA: Diagnosis not present

## 2020-11-14 DIAGNOSIS — E7841 Elevated Lipoprotein(a): Secondary | ICD-10-CM | POA: Diagnosis not present

## 2020-11-14 DIAGNOSIS — Z992 Dependence on renal dialysis: Secondary | ICD-10-CM | POA: Diagnosis not present

## 2020-11-14 DIAGNOSIS — E878 Other disorders of electrolyte and fluid balance, not elsewhere classified: Secondary | ICD-10-CM | POA: Diagnosis not present

## 2020-11-15 DIAGNOSIS — K650 Generalized (acute) peritonitis: Secondary | ICD-10-CM | POA: Diagnosis not present

## 2020-11-15 DIAGNOSIS — E440 Moderate protein-calorie malnutrition: Secondary | ICD-10-CM | POA: Diagnosis not present

## 2020-11-15 DIAGNOSIS — D631 Anemia in chronic kidney disease: Secondary | ICD-10-CM | POA: Diagnosis not present

## 2020-11-15 DIAGNOSIS — N2581 Secondary hyperparathyroidism of renal origin: Secondary | ICD-10-CM | POA: Diagnosis not present

## 2020-11-15 DIAGNOSIS — E7841 Elevated Lipoprotein(a): Secondary | ICD-10-CM | POA: Diagnosis not present

## 2020-11-15 DIAGNOSIS — N186 End stage renal disease: Secondary | ICD-10-CM | POA: Diagnosis not present

## 2020-11-15 DIAGNOSIS — Z79899 Other long term (current) drug therapy: Secondary | ICD-10-CM | POA: Diagnosis not present

## 2020-11-15 DIAGNOSIS — N2589 Other disorders resulting from impaired renal tubular function: Secondary | ICD-10-CM | POA: Diagnosis not present

## 2020-11-15 DIAGNOSIS — E878 Other disorders of electrolyte and fluid balance, not elsewhere classified: Secondary | ICD-10-CM | POA: Diagnosis not present

## 2020-11-15 DIAGNOSIS — D509 Iron deficiency anemia, unspecified: Secondary | ICD-10-CM | POA: Diagnosis not present

## 2020-11-15 DIAGNOSIS — Z992 Dependence on renal dialysis: Secondary | ICD-10-CM | POA: Diagnosis not present

## 2020-11-15 DIAGNOSIS — K769 Liver disease, unspecified: Secondary | ICD-10-CM | POA: Diagnosis not present

## 2020-11-15 IMAGING — CT CT ABD-PELV W/O CM
2 of 4 series · 13 of 46 positions shown, 15 images · non-contrast
Comparison: None.

CLINICAL DATA: Preop evaluation for upcoming renal transplant

EXAM:
CT ABDOMEN AND PELVIS WITHOUT CONTRAST
TECHNIQUE: Multidetector CT imaging of the abdomen and pelvis was performed
following the standard protocol without IV contrast.

[Series 2: routine abdomen pelvis without 5.00 br40 s3 axial · axial · non-contrast · 0.58mm/px · z∈[+1130,+1520]mm · 10 of 96 slices shown, 12 images]
[im 9/96  soft-tissue]
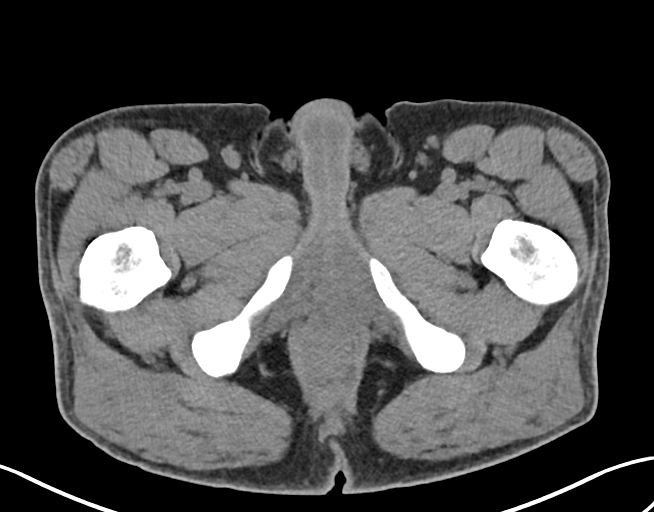
[im 9/96  bone]
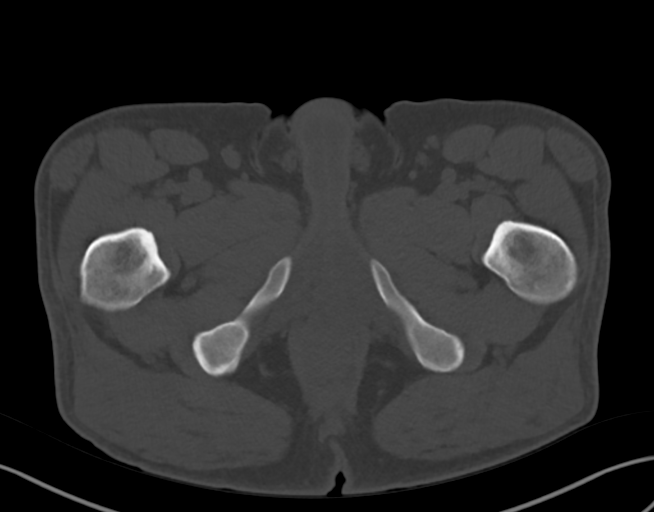
[im 17/96  soft-tissue]
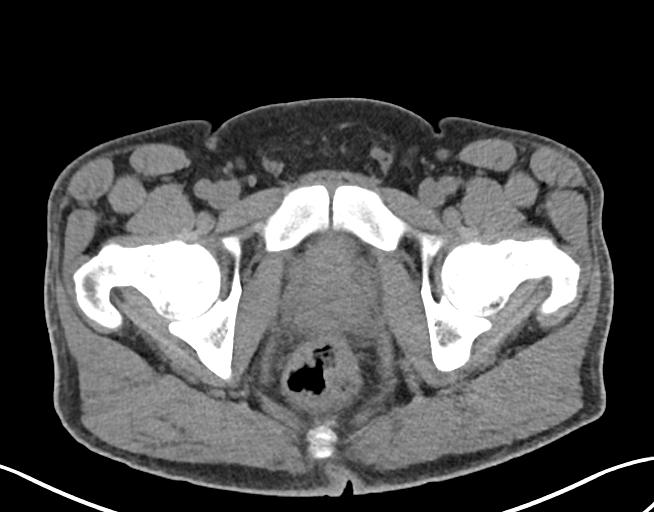
[im 25/96  soft-tissue]
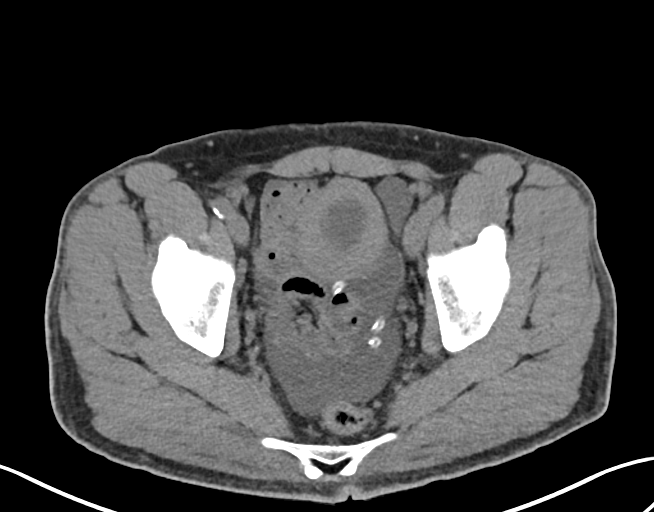
[im 34/96  soft-tissue]
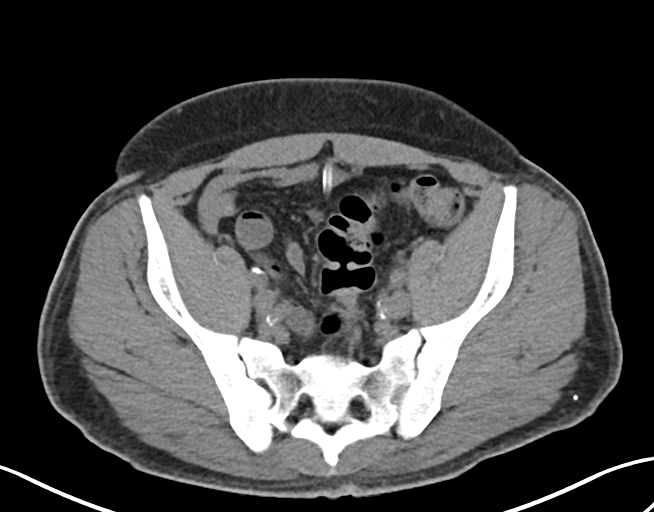
[im 42/96  soft-tissue]
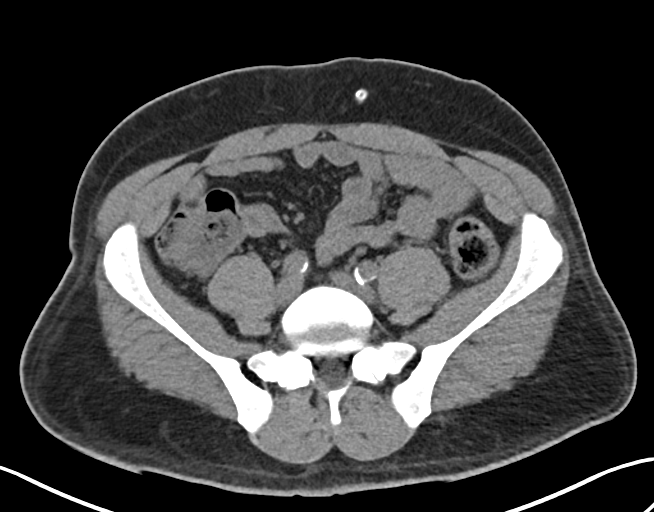
[im 54/96  soft-tissue]
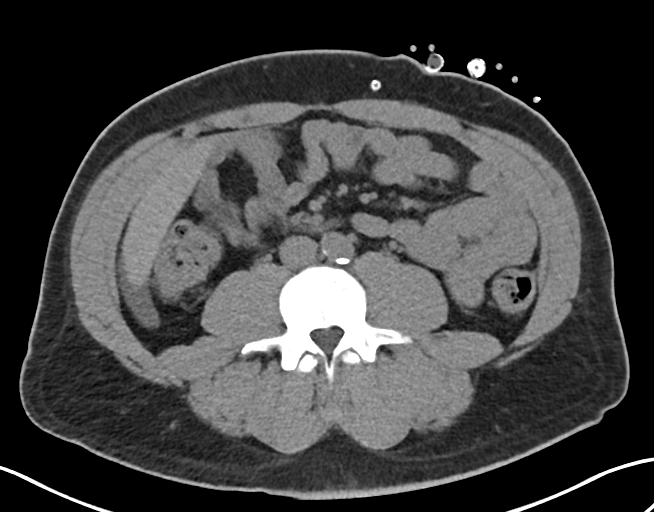
[im 62/96  soft-tissue]
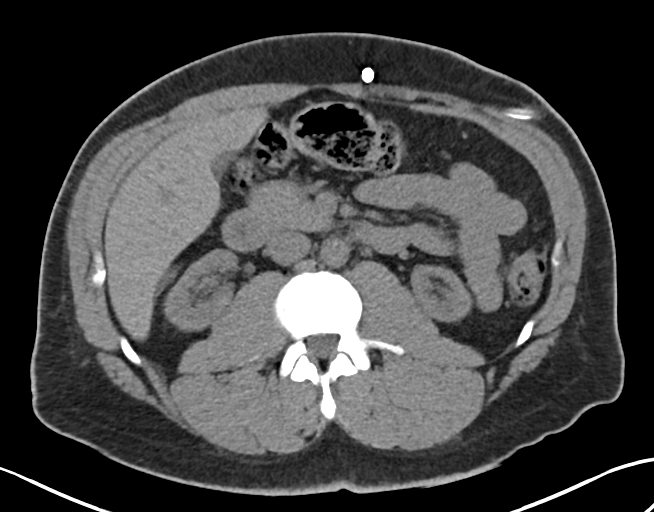
[im 71/96  soft-tissue]
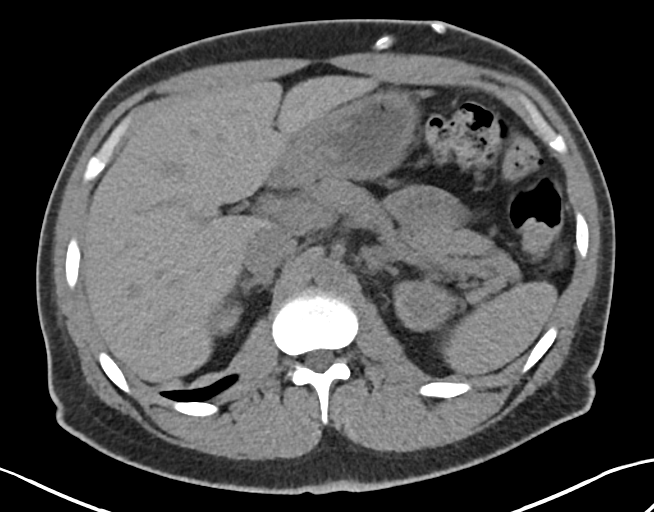
[im 79/96  soft-tissue]
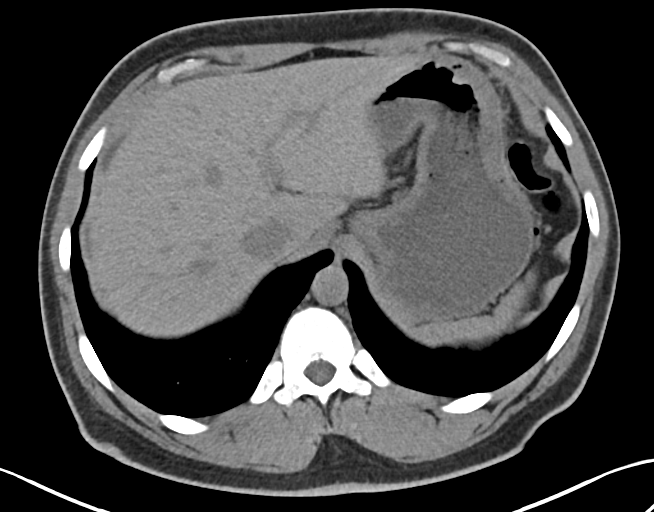
[im 79/96  bone]
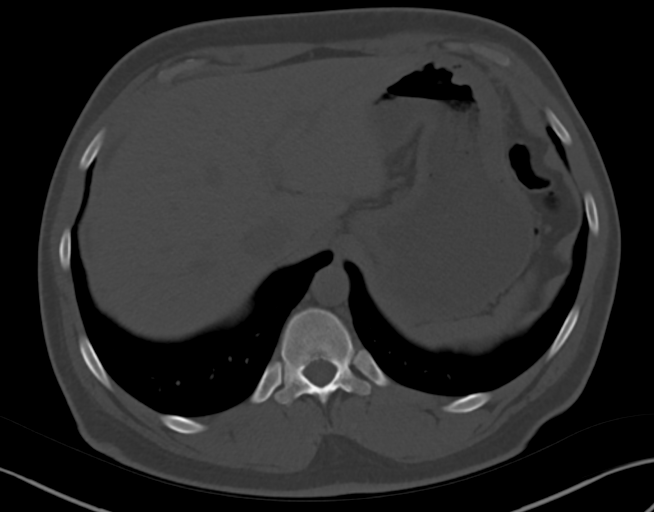
[im 87/96  soft-tissue]
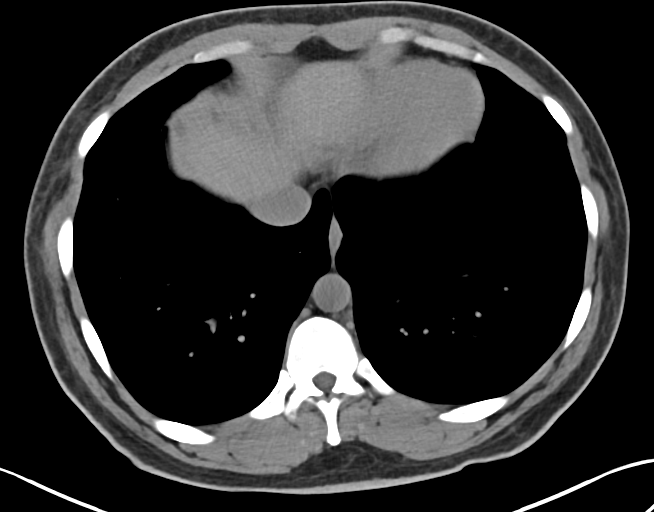

[Series 4: routine abdomen pelvis without 2.00 br40 s3 cor · coronal · non-contrast · 0.75mm/px · 3 of 149 slices shown]
[im 50/149  soft-tissue]
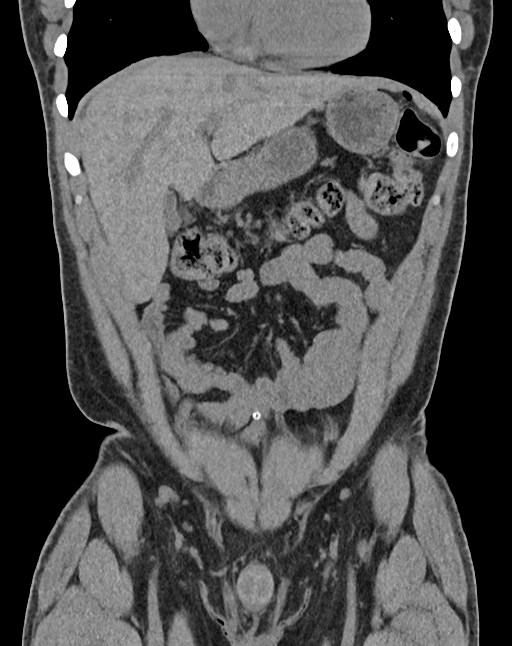
[im 66/149  soft-tissue]
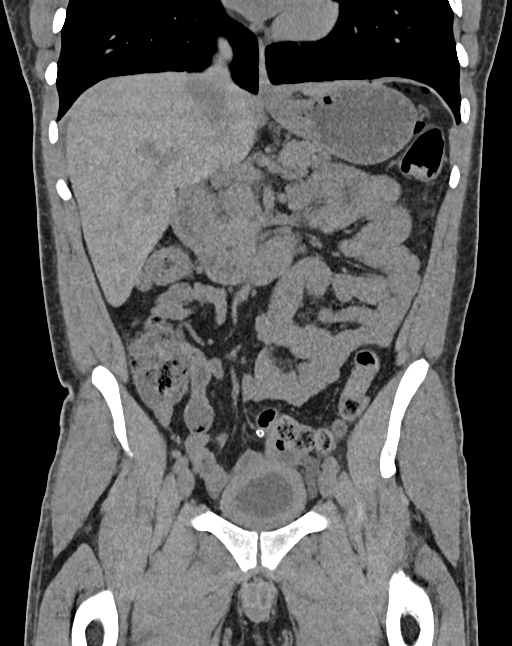
[im 83/149  soft-tissue]
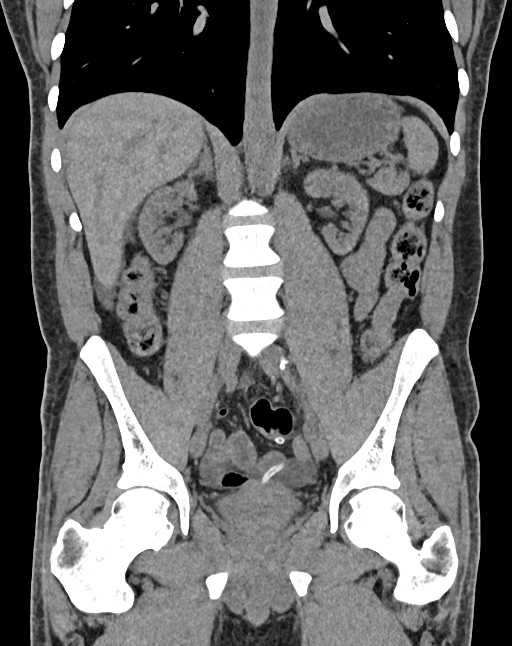

[13 of 46 positions shown; findings below may reference images not displayed]

FINDINGS: Lower chest: No acute abnormality.

Hepatobiliary: Liver is within normal limits. The gallbladder is
decompressed.

Pancreas: Unremarkable. No pancreatic ductal dilatation or
surrounding inflammatory changes.

Spleen: Normal in size without focal abnormality.

Adrenals/Urinary Tract: Adrenal glands are unremarkable. The kidneys
demonstrate evidence of a 1 cm right renal cyst. No mass lesion is
seen. Tiny exophytic cyst on the left is noted. No renal calculi or
obstructive changes are seen. The bladder is decompressed. Diffuse
bladder wall thickening is noted. This is of uncertain significance
and may be related to peritoneal dialysis as well as decompression.
Peritoneal dialysis catheter is noted deep within the pelvis.

Stomach/Bowel: No obstructive or inflammatory changes of the colon
are seen. The appendix is within normal limits. No small bowel
abnormality is noted. Stomach is within normal limits.

Vascular/Lymphatic: Mild vascular calcifications are seen without
aneurysmal dilatation. No adenopathy is seen.

Reproductive: Prostate is unremarkable.

Other: Free fluid is noted within the pelvis consistent with the
peritoneal dialysis.

Musculoskeletal: No acute or significant osseous findings.
IMPRESSION: Peritoneal dialysis catheter deep within the pelvis with mild free
fluid in the abdomen and pelvis consistent with the dialysate.

Bladder wall thickening of uncertain significance. This may be
related to the peritoneal dialysis as well as decompression. No
other focal abnormality is noted.

## 2020-11-16 DIAGNOSIS — E440 Moderate protein-calorie malnutrition: Secondary | ICD-10-CM | POA: Diagnosis not present

## 2020-11-16 DIAGNOSIS — K650 Generalized (acute) peritonitis: Secondary | ICD-10-CM | POA: Diagnosis not present

## 2020-11-16 DIAGNOSIS — K769 Liver disease, unspecified: Secondary | ICD-10-CM | POA: Diagnosis not present

## 2020-11-16 DIAGNOSIS — N186 End stage renal disease: Secondary | ICD-10-CM | POA: Diagnosis not present

## 2020-11-16 DIAGNOSIS — D631 Anemia in chronic kidney disease: Secondary | ICD-10-CM | POA: Diagnosis not present

## 2020-11-16 DIAGNOSIS — E878 Other disorders of electrolyte and fluid balance, not elsewhere classified: Secondary | ICD-10-CM | POA: Diagnosis not present

## 2020-11-16 DIAGNOSIS — E7841 Elevated Lipoprotein(a): Secondary | ICD-10-CM | POA: Diagnosis not present

## 2020-11-16 DIAGNOSIS — Z79899 Other long term (current) drug therapy: Secondary | ICD-10-CM | POA: Diagnosis not present

## 2020-11-16 DIAGNOSIS — D509 Iron deficiency anemia, unspecified: Secondary | ICD-10-CM | POA: Diagnosis not present

## 2020-11-16 DIAGNOSIS — N2589 Other disorders resulting from impaired renal tubular function: Secondary | ICD-10-CM | POA: Diagnosis not present

## 2020-11-16 DIAGNOSIS — Z992 Dependence on renal dialysis: Secondary | ICD-10-CM | POA: Diagnosis not present

## 2020-11-16 DIAGNOSIS — N2581 Secondary hyperparathyroidism of renal origin: Secondary | ICD-10-CM | POA: Diagnosis not present

## 2020-11-17 DIAGNOSIS — D509 Iron deficiency anemia, unspecified: Secondary | ICD-10-CM | POA: Diagnosis not present

## 2020-11-17 DIAGNOSIS — K650 Generalized (acute) peritonitis: Secondary | ICD-10-CM | POA: Diagnosis not present

## 2020-11-17 DIAGNOSIS — N2581 Secondary hyperparathyroidism of renal origin: Secondary | ICD-10-CM | POA: Diagnosis not present

## 2020-11-17 DIAGNOSIS — E878 Other disorders of electrolyte and fluid balance, not elsewhere classified: Secondary | ICD-10-CM | POA: Diagnosis not present

## 2020-11-17 DIAGNOSIS — E7841 Elevated Lipoprotein(a): Secondary | ICD-10-CM | POA: Diagnosis not present

## 2020-11-17 DIAGNOSIS — N2589 Other disorders resulting from impaired renal tubular function: Secondary | ICD-10-CM | POA: Diagnosis not present

## 2020-11-17 DIAGNOSIS — K769 Liver disease, unspecified: Secondary | ICD-10-CM | POA: Diagnosis not present

## 2020-11-17 DIAGNOSIS — Z992 Dependence on renal dialysis: Secondary | ICD-10-CM | POA: Diagnosis not present

## 2020-11-17 DIAGNOSIS — N186 End stage renal disease: Secondary | ICD-10-CM | POA: Diagnosis not present

## 2020-11-17 DIAGNOSIS — D631 Anemia in chronic kidney disease: Secondary | ICD-10-CM | POA: Diagnosis not present

## 2020-11-17 DIAGNOSIS — E440 Moderate protein-calorie malnutrition: Secondary | ICD-10-CM | POA: Diagnosis not present

## 2020-11-17 DIAGNOSIS — Z79899 Other long term (current) drug therapy: Secondary | ICD-10-CM | POA: Diagnosis not present

## 2020-11-18 DIAGNOSIS — E440 Moderate protein-calorie malnutrition: Secondary | ICD-10-CM | POA: Diagnosis not present

## 2020-11-18 DIAGNOSIS — N186 End stage renal disease: Secondary | ICD-10-CM | POA: Diagnosis not present

## 2020-11-18 DIAGNOSIS — N2589 Other disorders resulting from impaired renal tubular function: Secondary | ICD-10-CM | POA: Diagnosis not present

## 2020-11-18 DIAGNOSIS — K650 Generalized (acute) peritonitis: Secondary | ICD-10-CM | POA: Diagnosis not present

## 2020-11-18 DIAGNOSIS — K769 Liver disease, unspecified: Secondary | ICD-10-CM | POA: Diagnosis not present

## 2020-11-18 DIAGNOSIS — D631 Anemia in chronic kidney disease: Secondary | ICD-10-CM | POA: Diagnosis not present

## 2020-11-18 DIAGNOSIS — E878 Other disorders of electrolyte and fluid balance, not elsewhere classified: Secondary | ICD-10-CM | POA: Diagnosis not present

## 2020-11-18 DIAGNOSIS — D509 Iron deficiency anemia, unspecified: Secondary | ICD-10-CM | POA: Diagnosis not present

## 2020-11-18 DIAGNOSIS — Z79899 Other long term (current) drug therapy: Secondary | ICD-10-CM | POA: Diagnosis not present

## 2020-11-18 DIAGNOSIS — N2581 Secondary hyperparathyroidism of renal origin: Secondary | ICD-10-CM | POA: Diagnosis not present

## 2020-11-18 DIAGNOSIS — Z992 Dependence on renal dialysis: Secondary | ICD-10-CM | POA: Diagnosis not present

## 2020-11-18 DIAGNOSIS — E7841 Elevated Lipoprotein(a): Secondary | ICD-10-CM | POA: Diagnosis not present

## 2020-11-19 DIAGNOSIS — E7841 Elevated Lipoprotein(a): Secondary | ICD-10-CM | POA: Diagnosis not present

## 2020-11-19 DIAGNOSIS — K769 Liver disease, unspecified: Secondary | ICD-10-CM | POA: Diagnosis not present

## 2020-11-19 DIAGNOSIS — D631 Anemia in chronic kidney disease: Secondary | ICD-10-CM | POA: Diagnosis not present

## 2020-11-19 DIAGNOSIS — K650 Generalized (acute) peritonitis: Secondary | ICD-10-CM | POA: Diagnosis not present

## 2020-11-19 DIAGNOSIS — N186 End stage renal disease: Secondary | ICD-10-CM | POA: Diagnosis not present

## 2020-11-19 DIAGNOSIS — D509 Iron deficiency anemia, unspecified: Secondary | ICD-10-CM | POA: Diagnosis not present

## 2020-11-19 DIAGNOSIS — N2589 Other disorders resulting from impaired renal tubular function: Secondary | ICD-10-CM | POA: Diagnosis not present

## 2020-11-19 DIAGNOSIS — Z992 Dependence on renal dialysis: Secondary | ICD-10-CM | POA: Diagnosis not present

## 2020-11-19 DIAGNOSIS — N2581 Secondary hyperparathyroidism of renal origin: Secondary | ICD-10-CM | POA: Diagnosis not present

## 2020-11-19 DIAGNOSIS — E878 Other disorders of electrolyte and fluid balance, not elsewhere classified: Secondary | ICD-10-CM | POA: Diagnosis not present

## 2020-11-19 DIAGNOSIS — E440 Moderate protein-calorie malnutrition: Secondary | ICD-10-CM | POA: Diagnosis not present

## 2020-11-19 DIAGNOSIS — Z79899 Other long term (current) drug therapy: Secondary | ICD-10-CM | POA: Diagnosis not present

## 2020-11-20 DIAGNOSIS — Z7682 Awaiting organ transplant status: Secondary | ICD-10-CM | POA: Diagnosis not present

## 2020-11-20 DIAGNOSIS — K650 Generalized (acute) peritonitis: Secondary | ICD-10-CM | POA: Diagnosis not present

## 2020-11-20 DIAGNOSIS — K769 Liver disease, unspecified: Secondary | ICD-10-CM | POA: Diagnosis not present

## 2020-11-20 DIAGNOSIS — N186 End stage renal disease: Secondary | ICD-10-CM | POA: Diagnosis not present

## 2020-11-20 DIAGNOSIS — E878 Other disorders of electrolyte and fluid balance, not elsewhere classified: Secondary | ICD-10-CM | POA: Diagnosis not present

## 2020-11-20 DIAGNOSIS — Z79899 Other long term (current) drug therapy: Secondary | ICD-10-CM | POA: Diagnosis not present

## 2020-11-20 DIAGNOSIS — D631 Anemia in chronic kidney disease: Secondary | ICD-10-CM | POA: Diagnosis not present

## 2020-11-20 DIAGNOSIS — N2581 Secondary hyperparathyroidism of renal origin: Secondary | ICD-10-CM | POA: Diagnosis not present

## 2020-11-20 DIAGNOSIS — D509 Iron deficiency anemia, unspecified: Secondary | ICD-10-CM | POA: Diagnosis not present

## 2020-11-20 DIAGNOSIS — Z992 Dependence on renal dialysis: Secondary | ICD-10-CM | POA: Diagnosis not present

## 2020-11-20 DIAGNOSIS — N2589 Other disorders resulting from impaired renal tubular function: Secondary | ICD-10-CM | POA: Diagnosis not present

## 2020-11-20 DIAGNOSIS — E7841 Elevated Lipoprotein(a): Secondary | ICD-10-CM | POA: Diagnosis not present

## 2020-11-20 DIAGNOSIS — E440 Moderate protein-calorie malnutrition: Secondary | ICD-10-CM | POA: Diagnosis not present

## 2020-11-21 DIAGNOSIS — Z940 Kidney transplant status: Secondary | ICD-10-CM | POA: Diagnosis not present

## 2020-11-21 DIAGNOSIS — D84821 Immunodeficiency due to drugs: Secondary | ICD-10-CM | POA: Diagnosis not present

## 2020-11-21 DIAGNOSIS — K650 Generalized (acute) peritonitis: Secondary | ICD-10-CM | POA: Diagnosis not present

## 2020-11-21 DIAGNOSIS — E878 Other disorders of electrolyte and fluid balance, not elsewhere classified: Secondary | ICD-10-CM | POA: Diagnosis not present

## 2020-11-21 DIAGNOSIS — E8770 Fluid overload, unspecified: Secondary | ICD-10-CM | POA: Diagnosis not present

## 2020-11-21 DIAGNOSIS — I10 Essential (primary) hypertension: Secondary | ICD-10-CM | POA: Diagnosis not present

## 2020-11-21 DIAGNOSIS — R739 Hyperglycemia, unspecified: Secondary | ICD-10-CM | POA: Diagnosis not present

## 2020-11-21 DIAGNOSIS — R339 Retention of urine, unspecified: Secondary | ICD-10-CM | POA: Diagnosis not present

## 2020-11-21 DIAGNOSIS — K769 Liver disease, unspecified: Secondary | ICD-10-CM | POA: Diagnosis not present

## 2020-11-21 DIAGNOSIS — I120 Hypertensive chronic kidney disease with stage 5 chronic kidney disease or end stage renal disease: Secondary | ICD-10-CM | POA: Diagnosis not present

## 2020-11-21 DIAGNOSIS — T8619 Other complication of kidney transplant: Secondary | ICD-10-CM | POA: Diagnosis not present

## 2020-11-21 DIAGNOSIS — Z992 Dependence on renal dialysis: Secondary | ICD-10-CM | POA: Diagnosis not present

## 2020-11-21 DIAGNOSIS — Z20822 Contact with and (suspected) exposure to covid-19: Secondary | ICD-10-CM | POA: Diagnosis not present

## 2020-11-21 DIAGNOSIS — D631 Anemia in chronic kidney disease: Secondary | ICD-10-CM | POA: Diagnosis not present

## 2020-11-21 DIAGNOSIS — Z95828 Presence of other vascular implants and grafts: Secondary | ICD-10-CM | POA: Diagnosis not present

## 2020-11-21 DIAGNOSIS — D509 Iron deficiency anemia, unspecified: Secondary | ICD-10-CM | POA: Diagnosis not present

## 2020-11-21 DIAGNOSIS — R110 Nausea: Secondary | ICD-10-CM | POA: Diagnosis not present

## 2020-11-21 DIAGNOSIS — F17210 Nicotine dependence, cigarettes, uncomplicated: Secondary | ICD-10-CM | POA: Diagnosis not present

## 2020-11-21 DIAGNOSIS — I4891 Unspecified atrial fibrillation: Secondary | ICD-10-CM | POA: Diagnosis not present

## 2020-11-21 DIAGNOSIS — R066 Hiccough: Secondary | ICD-10-CM | POA: Diagnosis not present

## 2020-11-21 DIAGNOSIS — T380X5A Adverse effect of glucocorticoids and synthetic analogues, initial encounter: Secondary | ICD-10-CM | POA: Diagnosis not present

## 2020-11-21 DIAGNOSIS — Z792 Long term (current) use of antibiotics: Secondary | ICD-10-CM | POA: Diagnosis not present

## 2020-11-21 DIAGNOSIS — Z79899 Other long term (current) drug therapy: Secondary | ICD-10-CM | POA: Diagnosis not present

## 2020-11-21 DIAGNOSIS — D849 Immunodeficiency, unspecified: Secondary | ICD-10-CM | POA: Diagnosis not present

## 2020-11-21 DIAGNOSIS — E440 Moderate protein-calorie malnutrition: Secondary | ICD-10-CM | POA: Diagnosis not present

## 2020-11-21 DIAGNOSIS — N2581 Secondary hyperparathyroidism of renal origin: Secondary | ICD-10-CM | POA: Diagnosis not present

## 2020-11-21 DIAGNOSIS — N2589 Other disorders resulting from impaired renal tubular function: Secondary | ICD-10-CM | POA: Diagnosis not present

## 2020-11-21 DIAGNOSIS — Z7682 Awaiting organ transplant status: Secondary | ICD-10-CM | POA: Diagnosis not present

## 2020-11-21 DIAGNOSIS — N186 End stage renal disease: Secondary | ICD-10-CM | POA: Diagnosis not present

## 2020-11-21 DIAGNOSIS — Z888 Allergy status to other drugs, medicaments and biological substances status: Secondary | ICD-10-CM | POA: Diagnosis not present

## 2020-11-21 DIAGNOSIS — E875 Hyperkalemia: Secondary | ICD-10-CM | POA: Diagnosis not present

## 2020-11-21 DIAGNOSIS — Z885 Allergy status to narcotic agent status: Secondary | ICD-10-CM | POA: Diagnosis not present

## 2020-11-21 DIAGNOSIS — Z8619 Personal history of other infectious and parasitic diseases: Secondary | ICD-10-CM | POA: Diagnosis not present

## 2020-11-21 DIAGNOSIS — E7841 Elevated Lipoprotein(a): Secondary | ICD-10-CM | POA: Diagnosis not present

## 2020-11-21 DIAGNOSIS — Z4902 Encounter for fitting and adjustment of peritoneal dialysis catheter: Secondary | ICD-10-CM | POA: Diagnosis not present

## 2020-11-21 DIAGNOSIS — Z960 Presence of urogenital implants: Secondary | ICD-10-CM | POA: Diagnosis not present

## 2020-11-22 DIAGNOSIS — R739 Hyperglycemia, unspecified: Secondary | ICD-10-CM | POA: Diagnosis not present

## 2020-11-22 DIAGNOSIS — E875 Hyperkalemia: Secondary | ICD-10-CM | POA: Diagnosis not present

## 2020-11-22 DIAGNOSIS — Z960 Presence of urogenital implants: Secondary | ICD-10-CM | POA: Diagnosis not present

## 2020-11-22 DIAGNOSIS — T380X5A Adverse effect of glucocorticoids and synthetic analogues, initial encounter: Secondary | ICD-10-CM | POA: Diagnosis not present

## 2020-11-22 DIAGNOSIS — R066 Hiccough: Secondary | ICD-10-CM | POA: Diagnosis not present

## 2020-11-22 DIAGNOSIS — Z792 Long term (current) use of antibiotics: Secondary | ICD-10-CM | POA: Diagnosis not present

## 2020-11-22 DIAGNOSIS — I10 Essential (primary) hypertension: Secondary | ICD-10-CM | POA: Diagnosis not present

## 2020-11-22 DIAGNOSIS — Z940 Kidney transplant status: Secondary | ICD-10-CM | POA: Diagnosis not present

## 2020-11-22 DIAGNOSIS — Z7682 Awaiting organ transplant status: Secondary | ICD-10-CM | POA: Diagnosis not present

## 2020-11-22 DIAGNOSIS — D849 Immunodeficiency, unspecified: Secondary | ICD-10-CM | POA: Diagnosis not present

## 2020-11-23 DIAGNOSIS — I10 Essential (primary) hypertension: Secondary | ICD-10-CM | POA: Diagnosis not present

## 2020-11-23 DIAGNOSIS — Z940 Kidney transplant status: Secondary | ICD-10-CM | POA: Diagnosis not present

## 2020-11-23 DIAGNOSIS — K769 Liver disease, unspecified: Secondary | ICD-10-CM | POA: Diagnosis not present

## 2020-11-23 DIAGNOSIS — N2589 Other disorders resulting from impaired renal tubular function: Secondary | ICD-10-CM | POA: Diagnosis not present

## 2020-11-23 DIAGNOSIS — E878 Other disorders of electrolyte and fluid balance, not elsewhere classified: Secondary | ICD-10-CM | POA: Diagnosis not present

## 2020-11-23 DIAGNOSIS — E440 Moderate protein-calorie malnutrition: Secondary | ICD-10-CM | POA: Diagnosis not present

## 2020-11-23 DIAGNOSIS — Z792 Long term (current) use of antibiotics: Secondary | ICD-10-CM | POA: Diagnosis not present

## 2020-11-23 DIAGNOSIS — D849 Immunodeficiency, unspecified: Secondary | ICD-10-CM | POA: Diagnosis not present

## 2020-11-23 DIAGNOSIS — Z79899 Other long term (current) drug therapy: Secondary | ICD-10-CM | POA: Diagnosis not present

## 2020-11-23 DIAGNOSIS — E7841 Elevated Lipoprotein(a): Secondary | ICD-10-CM | POA: Diagnosis not present

## 2020-11-23 DIAGNOSIS — E875 Hyperkalemia: Secondary | ICD-10-CM | POA: Diagnosis not present

## 2020-11-23 DIAGNOSIS — D509 Iron deficiency anemia, unspecified: Secondary | ICD-10-CM | POA: Diagnosis not present

## 2020-11-23 DIAGNOSIS — N186 End stage renal disease: Secondary | ICD-10-CM | POA: Diagnosis not present

## 2020-11-23 DIAGNOSIS — K650 Generalized (acute) peritonitis: Secondary | ICD-10-CM | POA: Diagnosis not present

## 2020-11-23 DIAGNOSIS — D631 Anemia in chronic kidney disease: Secondary | ICD-10-CM | POA: Diagnosis not present

## 2020-11-23 DIAGNOSIS — Z992 Dependence on renal dialysis: Secondary | ICD-10-CM | POA: Diagnosis not present

## 2020-11-23 DIAGNOSIS — N2581 Secondary hyperparathyroidism of renal origin: Secondary | ICD-10-CM | POA: Diagnosis not present

## 2020-11-24 DIAGNOSIS — Z940 Kidney transplant status: Secondary | ICD-10-CM | POA: Diagnosis not present

## 2020-11-24 DIAGNOSIS — Z792 Long term (current) use of antibiotics: Secondary | ICD-10-CM | POA: Diagnosis not present

## 2020-11-24 DIAGNOSIS — I10 Essential (primary) hypertension: Secondary | ICD-10-CM | POA: Diagnosis not present

## 2020-11-24 DIAGNOSIS — R739 Hyperglycemia, unspecified: Secondary | ICD-10-CM | POA: Diagnosis not present

## 2020-11-24 DIAGNOSIS — T380X5A Adverse effect of glucocorticoids and synthetic analogues, initial encounter: Secondary | ICD-10-CM | POA: Diagnosis not present

## 2020-11-24 DIAGNOSIS — R066 Hiccough: Secondary | ICD-10-CM | POA: Diagnosis not present

## 2020-11-24 DIAGNOSIS — E875 Hyperkalemia: Secondary | ICD-10-CM | POA: Diagnosis not present

## 2020-11-24 DIAGNOSIS — Z960 Presence of urogenital implants: Secondary | ICD-10-CM | POA: Diagnosis not present

## 2020-11-24 DIAGNOSIS — T8619 Other complication of kidney transplant: Secondary | ICD-10-CM | POA: Diagnosis not present

## 2020-11-24 DIAGNOSIS — D849 Immunodeficiency, unspecified: Secondary | ICD-10-CM | POA: Diagnosis not present

## 2020-11-25 DIAGNOSIS — Z792 Long term (current) use of antibiotics: Secondary | ICD-10-CM | POA: Diagnosis not present

## 2020-11-25 DIAGNOSIS — D849 Immunodeficiency, unspecified: Secondary | ICD-10-CM | POA: Diagnosis not present

## 2020-11-25 DIAGNOSIS — T8619 Other complication of kidney transplant: Secondary | ICD-10-CM | POA: Diagnosis not present

## 2020-11-25 DIAGNOSIS — Z940 Kidney transplant status: Secondary | ICD-10-CM | POA: Diagnosis not present

## 2020-11-25 DIAGNOSIS — I10 Essential (primary) hypertension: Secondary | ICD-10-CM | POA: Diagnosis not present

## 2020-11-25 DIAGNOSIS — E875 Hyperkalemia: Secondary | ICD-10-CM | POA: Diagnosis not present

## 2020-11-25 DIAGNOSIS — R066 Hiccough: Secondary | ICD-10-CM | POA: Diagnosis not present

## 2020-11-26 DIAGNOSIS — I10 Essential (primary) hypertension: Secondary | ICD-10-CM | POA: Diagnosis not present

## 2020-11-26 DIAGNOSIS — T8619 Other complication of kidney transplant: Secondary | ICD-10-CM | POA: Diagnosis not present

## 2020-11-26 DIAGNOSIS — Z7682 Awaiting organ transplant status: Secondary | ICD-10-CM | POA: Diagnosis not present

## 2020-11-26 DIAGNOSIS — D849 Immunodeficiency, unspecified: Secondary | ICD-10-CM | POA: Diagnosis not present

## 2020-11-26 DIAGNOSIS — R066 Hiccough: Secondary | ICD-10-CM | POA: Diagnosis not present

## 2020-11-26 DIAGNOSIS — E875 Hyperkalemia: Secondary | ICD-10-CM | POA: Diagnosis not present

## 2020-11-27 DIAGNOSIS — Z792 Long term (current) use of antibiotics: Secondary | ICD-10-CM | POA: Diagnosis not present

## 2020-11-27 DIAGNOSIS — D849 Immunodeficiency, unspecified: Secondary | ICD-10-CM | POA: Diagnosis not present

## 2020-11-27 DIAGNOSIS — E875 Hyperkalemia: Secondary | ICD-10-CM | POA: Diagnosis not present

## 2020-11-27 DIAGNOSIS — I10 Essential (primary) hypertension: Secondary | ICD-10-CM | POA: Diagnosis not present

## 2020-11-27 DIAGNOSIS — I4891 Unspecified atrial fibrillation: Secondary | ICD-10-CM | POA: Diagnosis not present

## 2020-11-27 DIAGNOSIS — Z7682 Awaiting organ transplant status: Secondary | ICD-10-CM | POA: Diagnosis not present

## 2020-11-27 DIAGNOSIS — R339 Retention of urine, unspecified: Secondary | ICD-10-CM | POA: Diagnosis not present

## 2020-11-28 ENCOUNTER — Telehealth: Payer: Self-pay | Admitting: Family Medicine

## 2020-11-28 ENCOUNTER — Telehealth: Payer: Self-pay

## 2020-11-28 DIAGNOSIS — Z7682 Awaiting organ transplant status: Secondary | ICD-10-CM | POA: Diagnosis not present

## 2020-11-28 DIAGNOSIS — Z792 Long term (current) use of antibiotics: Secondary | ICD-10-CM | POA: Diagnosis not present

## 2020-11-28 DIAGNOSIS — D849 Immunodeficiency, unspecified: Secondary | ICD-10-CM | POA: Diagnosis not present

## 2020-11-28 DIAGNOSIS — E875 Hyperkalemia: Secondary | ICD-10-CM | POA: Diagnosis not present

## 2020-11-28 DIAGNOSIS — I10 Essential (primary) hypertension: Secondary | ICD-10-CM | POA: Diagnosis not present

## 2020-11-28 NOTE — Telephone Encounter (Signed)
noted 

## 2020-11-28 NOTE — Telephone Encounter (Signed)
Patient returned call to schedule AMV at this time patient could not schedule due to having major surgery and in the hospital patient will call back to schedule at a later time

## 2020-11-28 NOTE — Telephone Encounter (Signed)
Left message for patient to call back and schedule Medicare Annual Wellness Visit (AWV) either virtually or in office.   Left  my Cory Dixon number [phone]   awv-i per [place] 03/07/17  please schedule at anytime with [facility] Nurse Health Advisor 1 or 2   This should be a 45 minute visit.

## 2020-11-29 DIAGNOSIS — Z7682 Awaiting organ transplant status: Secondary | ICD-10-CM | POA: Diagnosis not present

## 2020-11-29 DIAGNOSIS — T8619 Other complication of kidney transplant: Secondary | ICD-10-CM | POA: Diagnosis not present

## 2020-12-01 DIAGNOSIS — Z79899 Other long term (current) drug therapy: Secondary | ICD-10-CM | POA: Diagnosis not present

## 2020-12-01 DIAGNOSIS — Z5181 Encounter for therapeutic drug level monitoring: Secondary | ICD-10-CM | POA: Diagnosis not present

## 2020-12-01 DIAGNOSIS — Z940 Kidney transplant status: Secondary | ICD-10-CM | POA: Diagnosis not present

## 2020-12-04 DIAGNOSIS — Z940 Kidney transplant status: Secondary | ICD-10-CM | POA: Diagnosis not present

## 2020-12-04 DIAGNOSIS — B349 Viral infection, unspecified: Secondary | ICD-10-CM | POA: Diagnosis not present

## 2020-12-04 DIAGNOSIS — B259 Cytomegaloviral disease, unspecified: Secondary | ICD-10-CM | POA: Diagnosis not present

## 2020-12-04 DIAGNOSIS — Z5181 Encounter for therapeutic drug level monitoring: Secondary | ICD-10-CM | POA: Diagnosis not present

## 2020-12-05 DIAGNOSIS — T380X5A Adverse effect of glucocorticoids and synthetic analogues, initial encounter: Secondary | ICD-10-CM | POA: Diagnosis not present

## 2020-12-05 DIAGNOSIS — D84821 Immunodeficiency due to drugs: Secondary | ICD-10-CM | POA: Diagnosis not present

## 2020-12-05 DIAGNOSIS — Z7952 Long term (current) use of systemic steroids: Secondary | ICD-10-CM | POA: Diagnosis not present

## 2020-12-05 DIAGNOSIS — I4891 Unspecified atrial fibrillation: Secondary | ICD-10-CM | POA: Diagnosis not present

## 2020-12-05 DIAGNOSIS — F17200 Nicotine dependence, unspecified, uncomplicated: Secondary | ICD-10-CM | POA: Diagnosis not present

## 2020-12-05 DIAGNOSIS — I10 Essential (primary) hypertension: Secondary | ICD-10-CM | POA: Diagnosis not present

## 2020-12-05 DIAGNOSIS — K219 Gastro-esophageal reflux disease without esophagitis: Secondary | ICD-10-CM | POA: Diagnosis not present

## 2020-12-05 DIAGNOSIS — E875 Hyperkalemia: Secondary | ICD-10-CM | POA: Diagnosis not present

## 2020-12-05 DIAGNOSIS — T8619 Other complication of kidney transplant: Secondary | ICD-10-CM | POA: Diagnosis not present

## 2020-12-05 DIAGNOSIS — Z940 Kidney transplant status: Secondary | ICD-10-CM | POA: Diagnosis not present

## 2020-12-05 DIAGNOSIS — Z79899 Other long term (current) drug therapy: Secondary | ICD-10-CM | POA: Diagnosis not present

## 2020-12-05 DIAGNOSIS — R739 Hyperglycemia, unspecified: Secondary | ICD-10-CM | POA: Diagnosis not present

## 2020-12-05 DIAGNOSIS — Z960 Presence of urogenital implants: Secondary | ICD-10-CM | POA: Diagnosis not present

## 2020-12-05 DIAGNOSIS — Z792 Long term (current) use of antibiotics: Secondary | ICD-10-CM | POA: Diagnosis not present

## 2020-12-05 DIAGNOSIS — B258 Other cytomegaloviral diseases: Secondary | ICD-10-CM | POA: Diagnosis not present

## 2020-12-05 DIAGNOSIS — D849 Immunodeficiency, unspecified: Secondary | ICD-10-CM | POA: Diagnosis not present

## 2020-12-07 DIAGNOSIS — D849 Immunodeficiency, unspecified: Secondary | ICD-10-CM | POA: Diagnosis not present

## 2020-12-07 DIAGNOSIS — Z940 Kidney transplant status: Secondary | ICD-10-CM | POA: Diagnosis not present

## 2020-12-12 DIAGNOSIS — I480 Paroxysmal atrial fibrillation: Secondary | ICD-10-CM | POA: Diagnosis not present

## 2020-12-12 DIAGNOSIS — I10 Essential (primary) hypertension: Secondary | ICD-10-CM | POA: Diagnosis not present

## 2020-12-12 DIAGNOSIS — Z1159 Encounter for screening for other viral diseases: Secondary | ICD-10-CM | POA: Diagnosis not present

## 2020-12-12 DIAGNOSIS — Z940 Kidney transplant status: Secondary | ICD-10-CM | POA: Diagnosis not present

## 2020-12-12 DIAGNOSIS — Z79899 Other long term (current) drug therapy: Secondary | ICD-10-CM | POA: Diagnosis not present

## 2020-12-12 DIAGNOSIS — E875 Hyperkalemia: Secondary | ICD-10-CM | POA: Diagnosis not present

## 2020-12-12 DIAGNOSIS — T8619 Other complication of kidney transplant: Secondary | ICD-10-CM | POA: Diagnosis not present

## 2020-12-12 DIAGNOSIS — D849 Immunodeficiency, unspecified: Secondary | ICD-10-CM | POA: Diagnosis not present

## 2020-12-12 DIAGNOSIS — I4891 Unspecified atrial fibrillation: Secondary | ICD-10-CM | POA: Diagnosis not present

## 2020-12-12 DIAGNOSIS — E872 Acidosis: Secondary | ICD-10-CM | POA: Diagnosis not present

## 2020-12-12 DIAGNOSIS — Z48298 Encounter for aftercare following other organ transplant: Secondary | ICD-10-CM | POA: Diagnosis not present

## 2020-12-12 DIAGNOSIS — Z298 Encounter for other specified prophylactic measures: Secondary | ICD-10-CM | POA: Diagnosis not present

## 2020-12-12 DIAGNOSIS — D84821 Immunodeficiency due to drugs: Secondary | ICD-10-CM | POA: Diagnosis not present

## 2020-12-12 DIAGNOSIS — B259 Cytomegaloviral disease, unspecified: Secondary | ICD-10-CM | POA: Diagnosis not present

## 2020-12-14 DIAGNOSIS — Z5181 Encounter for therapeutic drug level monitoring: Secondary | ICD-10-CM | POA: Diagnosis not present

## 2020-12-14 DIAGNOSIS — B259 Cytomegaloviral disease, unspecified: Secondary | ICD-10-CM | POA: Diagnosis not present

## 2020-12-14 DIAGNOSIS — Z940 Kidney transplant status: Secondary | ICD-10-CM | POA: Diagnosis not present

## 2020-12-14 DIAGNOSIS — B349 Viral infection, unspecified: Secondary | ICD-10-CM | POA: Diagnosis not present

## 2020-12-17 DIAGNOSIS — D849 Immunodeficiency, unspecified: Secondary | ICD-10-CM | POA: Diagnosis not present

## 2020-12-17 DIAGNOSIS — Z940 Kidney transplant status: Secondary | ICD-10-CM | POA: Diagnosis not present

## 2020-12-21 DIAGNOSIS — T380X5A Adverse effect of glucocorticoids and synthetic analogues, initial encounter: Secondary | ICD-10-CM | POA: Diagnosis not present

## 2020-12-21 DIAGNOSIS — Z792 Long term (current) use of antibiotics: Secondary | ICD-10-CM | POA: Diagnosis not present

## 2020-12-21 DIAGNOSIS — Z940 Kidney transplant status: Secondary | ICD-10-CM | POA: Diagnosis not present

## 2020-12-21 DIAGNOSIS — E875 Hyperkalemia: Secondary | ICD-10-CM | POA: Diagnosis not present

## 2020-12-21 DIAGNOSIS — I10 Essential (primary) hypertension: Secondary | ICD-10-CM | POA: Diagnosis not present

## 2020-12-21 DIAGNOSIS — K219 Gastro-esophageal reflux disease without esophagitis: Secondary | ICD-10-CM | POA: Diagnosis not present

## 2020-12-21 DIAGNOSIS — R739 Hyperglycemia, unspecified: Secondary | ICD-10-CM | POA: Diagnosis not present

## 2020-12-21 DIAGNOSIS — D849 Immunodeficiency, unspecified: Secondary | ICD-10-CM | POA: Diagnosis not present

## 2020-12-21 DIAGNOSIS — I4891 Unspecified atrial fibrillation: Secondary | ICD-10-CM | POA: Diagnosis not present

## 2020-12-25 ENCOUNTER — Telehealth: Payer: Self-pay | Admitting: *Deleted

## 2020-12-25 NOTE — Telephone Encounter (Signed)
Pt declined AWV. °

## 2020-12-28 DIAGNOSIS — D849 Immunodeficiency, unspecified: Secondary | ICD-10-CM | POA: Diagnosis not present

## 2020-12-28 DIAGNOSIS — Z940 Kidney transplant status: Secondary | ICD-10-CM | POA: Diagnosis not present

## 2020-12-28 DIAGNOSIS — Z466 Encounter for fitting and adjustment of urinary device: Secondary | ICD-10-CM | POA: Diagnosis not present

## 2021-01-04 DIAGNOSIS — Z940 Kidney transplant status: Secondary | ICD-10-CM | POA: Diagnosis not present

## 2021-01-04 DIAGNOSIS — D849 Immunodeficiency, unspecified: Secondary | ICD-10-CM | POA: Diagnosis not present

## 2021-01-09 DIAGNOSIS — Z79899 Other long term (current) drug therapy: Secondary | ICD-10-CM | POA: Diagnosis not present

## 2021-01-09 DIAGNOSIS — D84821 Immunodeficiency due to drugs: Secondary | ICD-10-CM | POA: Diagnosis not present

## 2021-01-09 DIAGNOSIS — D849 Immunodeficiency, unspecified: Secondary | ICD-10-CM | POA: Diagnosis not present

## 2021-01-09 DIAGNOSIS — Z4822 Encounter for aftercare following kidney transplant: Secondary | ICD-10-CM | POA: Diagnosis not present

## 2021-01-09 DIAGNOSIS — Z792 Long term (current) use of antibiotics: Secondary | ICD-10-CM | POA: Diagnosis not present

## 2021-01-09 DIAGNOSIS — Z23 Encounter for immunization: Secondary | ICD-10-CM | POA: Diagnosis not present

## 2021-01-09 DIAGNOSIS — Z48298 Encounter for aftercare following other organ transplant: Secondary | ICD-10-CM | POA: Diagnosis not present

## 2021-01-09 DIAGNOSIS — I480 Paroxysmal atrial fibrillation: Secondary | ICD-10-CM | POA: Diagnosis not present

## 2021-01-09 DIAGNOSIS — Z7952 Long term (current) use of systemic steroids: Secondary | ICD-10-CM | POA: Diagnosis not present

## 2021-01-09 DIAGNOSIS — E875 Hyperkalemia: Secondary | ICD-10-CM | POA: Diagnosis not present

## 2021-01-09 DIAGNOSIS — Z79621 Long term (current) use of calcineurin inhibitor: Secondary | ICD-10-CM | POA: Diagnosis not present

## 2021-01-09 DIAGNOSIS — I10 Essential (primary) hypertension: Secondary | ICD-10-CM | POA: Diagnosis not present

## 2021-01-09 DIAGNOSIS — F17200 Nicotine dependence, unspecified, uncomplicated: Secondary | ICD-10-CM | POA: Diagnosis not present

## 2021-01-09 DIAGNOSIS — B258 Other cytomegaloviral diseases: Secondary | ICD-10-CM | POA: Diagnosis not present

## 2021-01-09 DIAGNOSIS — R7989 Other specified abnormal findings of blood chemistry: Secondary | ICD-10-CM | POA: Diagnosis not present

## 2021-01-09 DIAGNOSIS — Z298 Encounter for other specified prophylactic measures: Secondary | ICD-10-CM | POA: Diagnosis not present

## 2021-01-09 DIAGNOSIS — Z940 Kidney transplant status: Secondary | ICD-10-CM | POA: Diagnosis not present

## 2021-01-11 DIAGNOSIS — T8619 Other complication of kidney transplant: Secondary | ICD-10-CM | POA: Diagnosis not present

## 2021-01-11 DIAGNOSIS — Z940 Kidney transplant status: Secondary | ICD-10-CM | POA: Diagnosis not present

## 2021-01-11 DIAGNOSIS — N179 Acute kidney failure, unspecified: Secondary | ICD-10-CM | POA: Diagnosis not present

## 2021-01-18 DIAGNOSIS — Z940 Kidney transplant status: Secondary | ICD-10-CM | POA: Diagnosis not present

## 2021-01-18 DIAGNOSIS — D849 Immunodeficiency, unspecified: Secondary | ICD-10-CM | POA: Diagnosis not present

## 2021-01-23 DIAGNOSIS — N1330 Unspecified hydronephrosis: Secondary | ICD-10-CM | POA: Diagnosis not present

## 2021-01-23 DIAGNOSIS — N10 Acute tubulo-interstitial nephritis: Secondary | ICD-10-CM | POA: Diagnosis not present

## 2021-01-23 DIAGNOSIS — N119 Chronic tubulo-interstitial nephritis, unspecified: Secondary | ICD-10-CM | POA: Diagnosis not present

## 2021-01-23 DIAGNOSIS — Z940 Kidney transplant status: Secondary | ICD-10-CM | POA: Diagnosis not present

## 2021-01-23 DIAGNOSIS — T8619 Other complication of kidney transplant: Secondary | ICD-10-CM | POA: Diagnosis not present

## 2021-01-29 DIAGNOSIS — Z5181 Encounter for therapeutic drug level monitoring: Secondary | ICD-10-CM | POA: Diagnosis not present

## 2021-01-29 DIAGNOSIS — Z940 Kidney transplant status: Secondary | ICD-10-CM | POA: Diagnosis not present

## 2021-01-31 ENCOUNTER — Encounter (HOSPITAL_COMMUNITY): Payer: Self-pay | Admitting: Emergency Medicine

## 2021-01-31 ENCOUNTER — Emergency Department (HOSPITAL_COMMUNITY)
Admission: EM | Admit: 2021-01-31 | Discharge: 2021-01-31 | Disposition: A | Discharge status: Home / Self Care | Payer: Medicare Other | Attending: Emergency Medicine | Admitting: Emergency Medicine

## 2021-01-31 ENCOUNTER — Other Ambulatory Visit: Payer: Self-pay

## 2021-01-31 DIAGNOSIS — R799 Abnormal finding of blood chemistry, unspecified: Secondary | ICD-10-CM | POA: Diagnosis not present

## 2021-01-31 DIAGNOSIS — R7881 Bacteremia: Secondary | ICD-10-CM | POA: Diagnosis not present

## 2021-01-31 DIAGNOSIS — A419 Sepsis, unspecified organism: Secondary | ICD-10-CM | POA: Diagnosis not present

## 2021-01-31 DIAGNOSIS — Z5321 Procedure and treatment not carried out due to patient leaving prior to being seen by health care provider: Secondary | ICD-10-CM | POA: Diagnosis not present

## 2021-01-31 DIAGNOSIS — R895 Abnormal microbiological findings in specimens from other organs, systems and tissues: Secondary | ICD-10-CM | POA: Diagnosis not present

## 2021-01-31 DIAGNOSIS — Z940 Kidney transplant status: Secondary | ICD-10-CM | POA: Diagnosis not present

## 2021-01-31 DIAGNOSIS — N3289 Other specified disorders of bladder: Secondary | ICD-10-CM | POA: Diagnosis not present

## 2021-01-31 NOTE — ED Triage Notes (Signed)
Pt states he was sent d/t +blood cultures. Hx kidney transplant, denies urinary symptoms, fever, cough, chest pain/shortness of breath.

## 2021-01-31 NOTE — ED Provider Notes (Signed)
Emergency Medicine Provider Triage Evaluation Note  Cory Dixon , a 35 y.o. male  was evaluated in triage.  Pt complains of has a blood cultures.  Kidney transplant in August followed with [facility].  Patient states his levels have been "all off."  He had a biopsy performed which not show evidence of rejection.  Had blood cultures drawn 2 days ago and he was called today to come to emergency department for admission for IV antibiotics.  Patient states he would likely need transport to [facility].  He denies fever, chills, nausea, vomiting, chest pain, abdominal pain, dysuria, rashes or lesions.  Reports compliance with his antirejection medications  Review of Systems  Positive: Positive blood cultures Negative: Fever, chills, emesis, chest pain, abdominal pain, urinary complaints  Physical Exam  BP (!) 151/87 (BP Location: Right Arm)   Pulse 79   Temp 98.4 F (36.9 C) (Oral)   Resp 16   SpO2 97%  Gen:   Awake, no distress   Resp:  Normal effort  MSK:   Moves extremities without difficulty  Other:    Medical Decision Making  Medically screening exam initiated at 8:32 PM.  Appropriate orders placed.  Zolton Hertzberg was informed that the remainder of the evaluation will be completed by another provider, this initial triage assessment does not replace that evaluation, and the importance of remaining in the ED until their evaluation is complete.  Positive blood cultures, patient appears overall well.  Repeat sepsis labs   Tech informed me that patient is leaving the ED.  He states he will go to [facility].  He is refusing blood work.  I discussed with patient in order to admit patient we will need to have repeat at least basic labs here.  He declines.  States he wants to go to [place].  I discussed leaving AGAINST MEDICAL ADVICE.  He voices understanding risk versus benefit including life or limb threatening condition. He declines further work-up or admission here at [facility], [place], PA-C 01/31/21 2038    Malvin Johns, MD 01/31/21 [phone]

## 2021-02-01 DIAGNOSIS — Z7982 Long term (current) use of aspirin: Secondary | ICD-10-CM | POA: Diagnosis not present

## 2021-02-01 DIAGNOSIS — E875 Hyperkalemia: Secondary | ICD-10-CM | POA: Diagnosis not present

## 2021-02-01 DIAGNOSIS — Z87891 Personal history of nicotine dependence: Secondary | ICD-10-CM | POA: Diagnosis not present

## 2021-02-01 DIAGNOSIS — R7881 Bacteremia: Secondary | ICD-10-CM | POA: Diagnosis not present

## 2021-02-01 DIAGNOSIS — N3289 Other specified disorders of bladder: Secondary | ICD-10-CM | POA: Diagnosis not present

## 2021-02-01 DIAGNOSIS — I480 Paroxysmal atrial fibrillation: Secondary | ICD-10-CM | POA: Diagnosis not present

## 2021-02-01 DIAGNOSIS — N179 Acute kidney failure, unspecified: Secondary | ICD-10-CM | POA: Diagnosis not present

## 2021-02-01 DIAGNOSIS — T8612 Kidney transplant failure: Secondary | ICD-10-CM | POA: Diagnosis not present

## 2021-02-01 DIAGNOSIS — Z20822 Contact with and (suspected) exposure to covid-19: Secondary | ICD-10-CM | POA: Diagnosis not present

## 2021-02-01 DIAGNOSIS — N12 Tubulo-interstitial nephritis, not specified as acute or chronic: Secondary | ICD-10-CM | POA: Diagnosis not present

## 2021-02-01 DIAGNOSIS — Z79899 Other long term (current) drug therapy: Secondary | ICD-10-CM | POA: Diagnosis not present

## 2021-02-01 DIAGNOSIS — D84821 Immunodeficiency due to drugs: Secondary | ICD-10-CM | POA: Diagnosis not present

## 2021-02-01 DIAGNOSIS — T8613 Kidney transplant infection: Secondary | ICD-10-CM | POA: Diagnosis not present

## 2021-02-01 DIAGNOSIS — N390 Urinary tract infection, site not specified: Secondary | ICD-10-CM | POA: Diagnosis not present

## 2021-02-01 DIAGNOSIS — I10 Essential (primary) hypertension: Secondary | ICD-10-CM | POA: Diagnosis not present

## 2021-02-01 DIAGNOSIS — Z940 Kidney transplant status: Secondary | ICD-10-CM | POA: Diagnosis not present

## 2021-02-01 DIAGNOSIS — B957 Other staphylococcus as the cause of diseases classified elsewhere: Secondary | ICD-10-CM | POA: Diagnosis not present

## 2021-02-01 DIAGNOSIS — Z885 Allergy status to narcotic agent status: Secondary | ICD-10-CM | POA: Diagnosis not present

## 2021-02-01 DIAGNOSIS — A419 Sepsis, unspecified organism: Secondary | ICD-10-CM | POA: Diagnosis not present

## 2021-02-01 DIAGNOSIS — D849 Immunodeficiency, unspecified: Secondary | ICD-10-CM | POA: Diagnosis not present

## 2021-02-02 DIAGNOSIS — R7881 Bacteremia: Secondary | ICD-10-CM | POA: Diagnosis not present

## 2021-02-02 DIAGNOSIS — N12 Tubulo-interstitial nephritis, not specified as acute or chronic: Secondary | ICD-10-CM | POA: Diagnosis not present

## 2021-02-02 DIAGNOSIS — D849 Immunodeficiency, unspecified: Secondary | ICD-10-CM | POA: Diagnosis not present

## 2021-02-02 DIAGNOSIS — Z940 Kidney transplant status: Secondary | ICD-10-CM | POA: Diagnosis not present

## 2021-02-02 DIAGNOSIS — A419 Sepsis, unspecified organism: Secondary | ICD-10-CM | POA: Diagnosis not present

## 2021-02-03 DIAGNOSIS — D849 Immunodeficiency, unspecified: Secondary | ICD-10-CM | POA: Diagnosis not present

## 2021-02-03 DIAGNOSIS — A419 Sepsis, unspecified organism: Secondary | ICD-10-CM | POA: Diagnosis not present

## 2021-02-03 DIAGNOSIS — Z940 Kidney transplant status: Secondary | ICD-10-CM | POA: Diagnosis not present

## 2021-02-04 DIAGNOSIS — Z940 Kidney transplant status: Secondary | ICD-10-CM | POA: Diagnosis not present

## 2021-02-04 DIAGNOSIS — A419 Sepsis, unspecified organism: Secondary | ICD-10-CM | POA: Diagnosis not present

## 2021-02-04 DIAGNOSIS — D849 Immunodeficiency, unspecified: Secondary | ICD-10-CM | POA: Diagnosis not present

## 2021-02-05 DIAGNOSIS — Z940 Kidney transplant status: Secondary | ICD-10-CM | POA: Diagnosis not present

## 2021-02-05 DIAGNOSIS — D849 Immunodeficiency, unspecified: Secondary | ICD-10-CM | POA: Diagnosis not present

## 2021-02-05 DIAGNOSIS — N390 Urinary tract infection, site not specified: Secondary | ICD-10-CM | POA: Diagnosis not present

## 2021-02-05 DIAGNOSIS — R7881 Bacteremia: Secondary | ICD-10-CM | POA: Diagnosis not present

## 2021-02-05 DIAGNOSIS — A419 Sepsis, unspecified organism: Secondary | ICD-10-CM | POA: Diagnosis not present

## 2021-02-06 DIAGNOSIS — A419 Sepsis, unspecified organism: Secondary | ICD-10-CM | POA: Diagnosis not present

## 2021-02-06 DIAGNOSIS — Z940 Kidney transplant status: Secondary | ICD-10-CM | POA: Diagnosis not present

## 2021-02-06 DIAGNOSIS — N390 Urinary tract infection, site not specified: Secondary | ICD-10-CM | POA: Diagnosis not present

## 2021-02-06 DIAGNOSIS — D849 Immunodeficiency, unspecified: Secondary | ICD-10-CM | POA: Diagnosis not present

## 2021-02-06 DIAGNOSIS — R7881 Bacteremia: Secondary | ICD-10-CM | POA: Diagnosis not present

## 2021-02-07 DIAGNOSIS — Z5181 Encounter for therapeutic drug level monitoring: Secondary | ICD-10-CM | POA: Diagnosis not present

## 2021-02-07 DIAGNOSIS — N189 Chronic kidney disease, unspecified: Secondary | ICD-10-CM | POA: Diagnosis not present

## 2021-02-07 DIAGNOSIS — I480 Paroxysmal atrial fibrillation: Secondary | ICD-10-CM | POA: Diagnosis not present

## 2021-02-07 DIAGNOSIS — E875 Hyperkalemia: Secondary | ICD-10-CM | POA: Diagnosis not present

## 2021-02-07 DIAGNOSIS — Z792 Long term (current) use of antibiotics: Secondary | ICD-10-CM | POA: Diagnosis not present

## 2021-02-07 DIAGNOSIS — Z7952 Long term (current) use of systemic steroids: Secondary | ICD-10-CM | POA: Insufficient documentation

## 2021-02-07 DIAGNOSIS — Z87891 Personal history of nicotine dependence: Secondary | ICD-10-CM | POA: Diagnosis not present

## 2021-02-07 DIAGNOSIS — D84821 Immunodeficiency due to drugs: Secondary | ICD-10-CM | POA: Diagnosis not present

## 2021-02-07 DIAGNOSIS — I129 Hypertensive chronic kidney disease with stage 1 through stage 4 chronic kidney disease, or unspecified chronic kidney disease: Secondary | ICD-10-CM | POA: Diagnosis not present

## 2021-02-07 DIAGNOSIS — Z7969 Long term (current) use of other immunomodulators and immunosuppressants: Secondary | ICD-10-CM | POA: Diagnosis not present

## 2021-02-07 DIAGNOSIS — Z940 Kidney transplant status: Secondary | ICD-10-CM | POA: Diagnosis not present

## 2021-02-07 DIAGNOSIS — F32A Depression, unspecified: Secondary | ICD-10-CM | POA: Diagnosis not present

## 2021-02-07 DIAGNOSIS — N119 Chronic tubulo-interstitial nephritis, unspecified: Secondary | ICD-10-CM | POA: Diagnosis not present

## 2021-02-07 DIAGNOSIS — Z452 Encounter for adjustment and management of vascular access device: Secondary | ICD-10-CM | POA: Diagnosis not present

## 2021-02-07 DIAGNOSIS — R338 Other retention of urine: Secondary | ICD-10-CM | POA: Diagnosis not present

## 2021-02-07 DIAGNOSIS — Z79621 Long term (current) use of calcineurin inhibitor: Secondary | ICD-10-CM | POA: Diagnosis not present

## 2021-02-07 DIAGNOSIS — B9689 Other specified bacterial agents as the cause of diseases classified elsewhere: Secondary | ICD-10-CM | POA: Diagnosis not present

## 2021-02-07 DIAGNOSIS — N10 Acute tubulo-interstitial nephritis: Secondary | ICD-10-CM | POA: Diagnosis not present

## 2021-02-07 DIAGNOSIS — R7881 Bacteremia: Secondary | ICD-10-CM | POA: Diagnosis not present

## 2021-02-07 DIAGNOSIS — N390 Urinary tract infection, site not specified: Secondary | ICD-10-CM | POA: Diagnosis not present

## 2021-02-11 DIAGNOSIS — E875 Hyperkalemia: Secondary | ICD-10-CM | POA: Diagnosis not present

## 2021-02-11 DIAGNOSIS — N390 Urinary tract infection, site not specified: Secondary | ICD-10-CM | POA: Diagnosis not present

## 2021-02-11 DIAGNOSIS — Z7969 Long term (current) use of other immunomodulators and immunosuppressants: Secondary | ICD-10-CM | POA: Diagnosis not present

## 2021-02-11 DIAGNOSIS — B9689 Other specified bacterial agents as the cause of diseases classified elsewhere: Secondary | ICD-10-CM | POA: Diagnosis not present

## 2021-02-11 DIAGNOSIS — Z87891 Personal history of nicotine dependence: Secondary | ICD-10-CM | POA: Diagnosis not present

## 2021-02-11 DIAGNOSIS — B349 Viral infection, unspecified: Secondary | ICD-10-CM | POA: Diagnosis not present

## 2021-02-11 DIAGNOSIS — F32A Depression, unspecified: Secondary | ICD-10-CM | POA: Diagnosis not present

## 2021-02-11 DIAGNOSIS — Z452 Encounter for adjustment and management of vascular access device: Secondary | ICD-10-CM | POA: Diagnosis not present

## 2021-02-11 DIAGNOSIS — D84821 Immunodeficiency due to drugs: Secondary | ICD-10-CM | POA: Diagnosis not present

## 2021-02-11 DIAGNOSIS — B259 Cytomegaloviral disease, unspecified: Secondary | ICD-10-CM | POA: Diagnosis not present

## 2021-02-11 DIAGNOSIS — Z79621 Long term (current) use of calcineurin inhibitor: Secondary | ICD-10-CM | POA: Diagnosis not present

## 2021-02-11 DIAGNOSIS — N189 Chronic kidney disease, unspecified: Secondary | ICD-10-CM | POA: Diagnosis not present

## 2021-02-11 DIAGNOSIS — I480 Paroxysmal atrial fibrillation: Secondary | ICD-10-CM | POA: Diagnosis not present

## 2021-02-11 DIAGNOSIS — R338 Other retention of urine: Secondary | ICD-10-CM | POA: Diagnosis not present

## 2021-02-11 DIAGNOSIS — R7881 Bacteremia: Secondary | ICD-10-CM | POA: Diagnosis not present

## 2021-02-11 DIAGNOSIS — Z792 Long term (current) use of antibiotics: Secondary | ICD-10-CM | POA: Diagnosis not present

## 2021-02-11 DIAGNOSIS — Z7952 Long term (current) use of systemic steroids: Secondary | ICD-10-CM | POA: Diagnosis not present

## 2021-02-11 DIAGNOSIS — N119 Chronic tubulo-interstitial nephritis, unspecified: Secondary | ICD-10-CM | POA: Diagnosis not present

## 2021-02-11 DIAGNOSIS — Z940 Kidney transplant status: Secondary | ICD-10-CM | POA: Diagnosis not present

## 2021-02-11 DIAGNOSIS — N10 Acute tubulo-interstitial nephritis: Secondary | ICD-10-CM | POA: Diagnosis not present

## 2021-02-11 DIAGNOSIS — Z5181 Encounter for therapeutic drug level monitoring: Secondary | ICD-10-CM | POA: Diagnosis not present

## 2021-02-11 DIAGNOSIS — I129 Hypertensive chronic kidney disease with stage 1 through stage 4 chronic kidney disease, or unspecified chronic kidney disease: Secondary | ICD-10-CM | POA: Diagnosis not present

## 2021-02-12 DIAGNOSIS — E875 Hyperkalemia: Secondary | ICD-10-CM | POA: Diagnosis not present

## 2021-02-12 DIAGNOSIS — N10 Acute tubulo-interstitial nephritis: Secondary | ICD-10-CM | POA: Diagnosis not present

## 2021-02-12 DIAGNOSIS — Z5181 Encounter for therapeutic drug level monitoring: Secondary | ICD-10-CM | POA: Diagnosis not present

## 2021-02-12 DIAGNOSIS — N119 Chronic tubulo-interstitial nephritis, unspecified: Secondary | ICD-10-CM | POA: Diagnosis not present

## 2021-02-12 DIAGNOSIS — Z7969 Long term (current) use of other immunomodulators and immunosuppressants: Secondary | ICD-10-CM | POA: Diagnosis not present

## 2021-02-12 DIAGNOSIS — Z79621 Long term (current) use of calcineurin inhibitor: Secondary | ICD-10-CM | POA: Diagnosis not present

## 2021-02-12 DIAGNOSIS — R338 Other retention of urine: Secondary | ICD-10-CM | POA: Diagnosis not present

## 2021-02-12 DIAGNOSIS — I480 Paroxysmal atrial fibrillation: Secondary | ICD-10-CM | POA: Diagnosis not present

## 2021-02-12 DIAGNOSIS — Z452 Encounter for adjustment and management of vascular access device: Secondary | ICD-10-CM | POA: Diagnosis not present

## 2021-02-12 DIAGNOSIS — Z940 Kidney transplant status: Secondary | ICD-10-CM | POA: Diagnosis not present

## 2021-02-12 DIAGNOSIS — B9689 Other specified bacterial agents as the cause of diseases classified elsewhere: Secondary | ICD-10-CM | POA: Diagnosis not present

## 2021-02-12 DIAGNOSIS — Z7952 Long term (current) use of systemic steroids: Secondary | ICD-10-CM | POA: Diagnosis not present

## 2021-02-12 DIAGNOSIS — N189 Chronic kidney disease, unspecified: Secondary | ICD-10-CM | POA: Diagnosis not present

## 2021-02-12 DIAGNOSIS — Z792 Long term (current) use of antibiotics: Secondary | ICD-10-CM | POA: Diagnosis not present

## 2021-02-12 DIAGNOSIS — F32A Depression, unspecified: Secondary | ICD-10-CM | POA: Diagnosis not present

## 2021-02-12 DIAGNOSIS — I129 Hypertensive chronic kidney disease with stage 1 through stage 4 chronic kidney disease, or unspecified chronic kidney disease: Secondary | ICD-10-CM | POA: Diagnosis not present

## 2021-02-12 DIAGNOSIS — T8613 Kidney transplant infection: Secondary | ICD-10-CM | POA: Diagnosis not present

## 2021-02-12 DIAGNOSIS — Z87891 Personal history of nicotine dependence: Secondary | ICD-10-CM | POA: Diagnosis not present

## 2021-02-12 DIAGNOSIS — D84821 Immunodeficiency due to drugs: Secondary | ICD-10-CM | POA: Diagnosis not present

## 2021-02-20 DIAGNOSIS — Z7952 Long term (current) use of systemic steroids: Secondary | ICD-10-CM | POA: Diagnosis not present

## 2021-02-20 DIAGNOSIS — Z4822 Encounter for aftercare following kidney transplant: Secondary | ICD-10-CM | POA: Diagnosis not present

## 2021-02-20 DIAGNOSIS — F17210 Nicotine dependence, cigarettes, uncomplicated: Secondary | ICD-10-CM | POA: Diagnosis not present

## 2021-02-20 DIAGNOSIS — Z79899 Other long term (current) drug therapy: Secondary | ICD-10-CM | POA: Diagnosis not present

## 2021-02-20 DIAGNOSIS — D84821 Immunodeficiency due to drugs: Secondary | ICD-10-CM | POA: Diagnosis not present

## 2021-02-20 DIAGNOSIS — R7881 Bacteremia: Secondary | ICD-10-CM | POA: Diagnosis not present

## 2021-02-20 DIAGNOSIS — N12 Tubulo-interstitial nephritis, not specified as acute or chronic: Secondary | ICD-10-CM | POA: Diagnosis not present

## 2021-02-20 DIAGNOSIS — D72819 Decreased white blood cell count, unspecified: Secondary | ICD-10-CM | POA: Diagnosis not present

## 2021-02-20 DIAGNOSIS — D849 Immunodeficiency, unspecified: Secondary | ICD-10-CM | POA: Diagnosis not present

## 2021-02-20 DIAGNOSIS — Z940 Kidney transplant status: Secondary | ICD-10-CM | POA: Diagnosis not present

## 2021-02-20 DIAGNOSIS — I4891 Unspecified atrial fibrillation: Secondary | ICD-10-CM | POA: Diagnosis not present

## 2021-02-26 DIAGNOSIS — I129 Hypertensive chronic kidney disease with stage 1 through stage 4 chronic kidney disease, or unspecified chronic kidney disease: Secondary | ICD-10-CM | POA: Diagnosis not present

## 2021-02-26 DIAGNOSIS — Z87891 Personal history of nicotine dependence: Secondary | ICD-10-CM | POA: Diagnosis not present

## 2021-02-26 DIAGNOSIS — Z7952 Long term (current) use of systemic steroids: Secondary | ICD-10-CM | POA: Diagnosis not present

## 2021-02-26 DIAGNOSIS — R338 Other retention of urine: Secondary | ICD-10-CM | POA: Diagnosis not present

## 2021-02-26 DIAGNOSIS — E875 Hyperkalemia: Secondary | ICD-10-CM | POA: Diagnosis not present

## 2021-02-26 DIAGNOSIS — N189 Chronic kidney disease, unspecified: Secondary | ICD-10-CM | POA: Diagnosis not present

## 2021-02-26 DIAGNOSIS — B9689 Other specified bacterial agents as the cause of diseases classified elsewhere: Secondary | ICD-10-CM | POA: Diagnosis not present

## 2021-02-26 DIAGNOSIS — Z792 Long term (current) use of antibiotics: Secondary | ICD-10-CM | POA: Diagnosis not present

## 2021-02-26 DIAGNOSIS — F32A Depression, unspecified: Secondary | ICD-10-CM | POA: Diagnosis not present

## 2021-02-26 DIAGNOSIS — Z940 Kidney transplant status: Secondary | ICD-10-CM | POA: Diagnosis not present

## 2021-02-26 DIAGNOSIS — I480 Paroxysmal atrial fibrillation: Secondary | ICD-10-CM | POA: Diagnosis not present

## 2021-02-26 DIAGNOSIS — Z5181 Encounter for therapeutic drug level monitoring: Secondary | ICD-10-CM | POA: Diagnosis not present

## 2021-02-26 DIAGNOSIS — Z79621 Long term (current) use of calcineurin inhibitor: Secondary | ICD-10-CM | POA: Diagnosis not present

## 2021-02-26 DIAGNOSIS — D84821 Immunodeficiency due to drugs: Secondary | ICD-10-CM | POA: Diagnosis not present

## 2021-02-26 DIAGNOSIS — N119 Chronic tubulo-interstitial nephritis, unspecified: Secondary | ICD-10-CM | POA: Diagnosis not present

## 2021-02-26 DIAGNOSIS — Z452 Encounter for adjustment and management of vascular access device: Secondary | ICD-10-CM | POA: Diagnosis not present

## 2021-02-26 DIAGNOSIS — Z7969 Long term (current) use of other immunomodulators and immunosuppressants: Secondary | ICD-10-CM | POA: Diagnosis not present

## 2021-02-26 DIAGNOSIS — N10 Acute tubulo-interstitial nephritis: Secondary | ICD-10-CM | POA: Diagnosis not present

## 2021-03-04 DIAGNOSIS — D849 Immunodeficiency, unspecified: Secondary | ICD-10-CM | POA: Diagnosis not present

## 2021-03-04 DIAGNOSIS — Z940 Kidney transplant status: Secondary | ICD-10-CM | POA: Diagnosis not present

## 2021-03-18 DIAGNOSIS — Z5181 Encounter for therapeutic drug level monitoring: Secondary | ICD-10-CM | POA: Diagnosis not present

## 2021-03-18 DIAGNOSIS — N269 Renal sclerosis, unspecified: Secondary | ICD-10-CM | POA: Diagnosis not present

## 2021-03-18 DIAGNOSIS — T8619 Other complication of kidney transplant: Secondary | ICD-10-CM | POA: Diagnosis not present

## 2021-03-18 DIAGNOSIS — Z4822 Encounter for aftercare following kidney transplant: Secondary | ICD-10-CM | POA: Diagnosis not present

## 2021-03-18 DIAGNOSIS — Z940 Kidney transplant status: Secondary | ICD-10-CM | POA: Diagnosis not present

## 2021-03-18 DIAGNOSIS — D689 Coagulation defect, unspecified: Secondary | ICD-10-CM | POA: Diagnosis not present

## 2021-03-18 DIAGNOSIS — N261 Atrophy of kidney (terminal): Secondary | ICD-10-CM | POA: Diagnosis not present

## 2021-03-25 DIAGNOSIS — Z5181 Encounter for therapeutic drug level monitoring: Secondary | ICD-10-CM | POA: Diagnosis not present

## 2021-03-25 DIAGNOSIS — B259 Cytomegaloviral disease, unspecified: Secondary | ICD-10-CM | POA: Diagnosis not present

## 2021-03-25 DIAGNOSIS — B349 Viral infection, unspecified: Secondary | ICD-10-CM | POA: Diagnosis not present

## 2021-03-25 DIAGNOSIS — Z940 Kidney transplant status: Secondary | ICD-10-CM | POA: Diagnosis not present

## 2021-04-03 DIAGNOSIS — Z940 Kidney transplant status: Secondary | ICD-10-CM | POA: Diagnosis not present

## 2021-04-03 DIAGNOSIS — I480 Paroxysmal atrial fibrillation: Secondary | ICD-10-CM | POA: Diagnosis not present

## 2021-04-03 DIAGNOSIS — Z720 Tobacco use: Secondary | ICD-10-CM | POA: Diagnosis not present

## 2021-04-03 DIAGNOSIS — I129 Hypertensive chronic kidney disease with stage 1 through stage 4 chronic kidney disease, or unspecified chronic kidney disease: Secondary | ICD-10-CM | POA: Diagnosis not present

## 2021-04-03 DIAGNOSIS — D631 Anemia in chronic kidney disease: Secondary | ICD-10-CM | POA: Diagnosis not present

## 2021-04-03 DIAGNOSIS — Z79899 Other long term (current) drug therapy: Secondary | ICD-10-CM | POA: Diagnosis not present

## 2021-04-03 DIAGNOSIS — N189 Chronic kidney disease, unspecified: Secondary | ICD-10-CM | POA: Diagnosis not present

## 2021-04-03 DIAGNOSIS — Z5181 Encounter for therapeutic drug level monitoring: Secondary | ICD-10-CM | POA: Diagnosis not present

## 2021-04-03 DIAGNOSIS — D649 Anemia, unspecified: Secondary | ICD-10-CM | POA: Diagnosis not present

## 2021-04-03 DIAGNOSIS — Z4822 Encounter for aftercare following kidney transplant: Secondary | ICD-10-CM | POA: Diagnosis not present

## 2021-04-03 DIAGNOSIS — D849 Immunodeficiency, unspecified: Secondary | ICD-10-CM | POA: Diagnosis not present

## 2021-04-03 DIAGNOSIS — Z79621 Long term (current) use of calcineurin inhibitor: Secondary | ICD-10-CM | POA: Diagnosis not present

## 2021-04-03 DIAGNOSIS — D84821 Immunodeficiency due to drugs: Secondary | ICD-10-CM | POA: Diagnosis not present

## 2021-04-16 ENCOUNTER — Observation Stay (HOSPITAL_COMMUNITY)
Admission: EM | Admit: 2021-04-16 | Discharge: 2021-04-17 | Discharge status: Against Medical Advice | Payer: Medicare Other | Attending: Internal Medicine | Admitting: Internal Medicine

## 2021-04-16 ENCOUNTER — Encounter (HOSPITAL_COMMUNITY): Payer: Self-pay

## 2021-04-16 ENCOUNTER — Emergency Department (HOSPITAL_COMMUNITY): Payer: Medicare Other

## 2021-04-16 DIAGNOSIS — R9431 Abnormal electrocardiogram [ECG] [EKG]: Secondary | ICD-10-CM

## 2021-04-16 DIAGNOSIS — Z20822 Contact with and (suspected) exposure to covid-19: Secondary | ICD-10-CM | POA: Insufficient documentation

## 2021-04-16 DIAGNOSIS — Z940 Kidney transplant status: Secondary | ICD-10-CM | POA: Insufficient documentation

## 2021-04-16 DIAGNOSIS — T383X5A Adverse effect of insulin and oral hypoglycemic [antidiabetic] drugs, initial encounter: Secondary | ICD-10-CM | POA: Diagnosis not present

## 2021-04-16 DIAGNOSIS — Z992 Dependence on renal dialysis: Secondary | ICD-10-CM | POA: Insufficient documentation

## 2021-04-16 DIAGNOSIS — F17210 Nicotine dependence, cigarettes, uncomplicated: Secondary | ICD-10-CM | POA: Diagnosis not present

## 2021-04-16 DIAGNOSIS — I120 Hypertensive chronic kidney disease with stage 5 chronic kidney disease or end stage renal disease: Secondary | ICD-10-CM | POA: Insufficient documentation

## 2021-04-16 DIAGNOSIS — E162 Hypoglycemia, unspecified: Secondary | ICD-10-CM | POA: Insufficient documentation

## 2021-04-16 DIAGNOSIS — B259 Cytomegaloviral disease, unspecified: Secondary | ICD-10-CM | POA: Diagnosis not present

## 2021-04-16 DIAGNOSIS — Z8679 Personal history of other diseases of the circulatory system: Secondary | ICD-10-CM | POA: Diagnosis not present

## 2021-04-16 DIAGNOSIS — Z79899 Other long term (current) drug therapy: Secondary | ICD-10-CM | POA: Diagnosis not present

## 2021-04-16 DIAGNOSIS — E875 Hyperkalemia: Secondary | ICD-10-CM | POA: Diagnosis not present

## 2021-04-16 DIAGNOSIS — E160 Drug-induced hypoglycemia without coma: Secondary | ICD-10-CM | POA: Diagnosis not present

## 2021-04-16 DIAGNOSIS — B349 Viral infection, unspecified: Secondary | ICD-10-CM | POA: Diagnosis not present

## 2021-04-16 DIAGNOSIS — Z5181 Encounter for therapeutic drug level monitoring: Secondary | ICD-10-CM | POA: Diagnosis not present

## 2021-04-16 DIAGNOSIS — R001 Bradycardia, unspecified: Secondary | ICD-10-CM | POA: Insufficient documentation

## 2021-04-16 DIAGNOSIS — N186 End stage renal disease: Secondary | ICD-10-CM | POA: Diagnosis not present

## 2021-04-16 DIAGNOSIS — I10 Essential (primary) hypertension: Secondary | ICD-10-CM

## 2021-04-16 DIAGNOSIS — I517 Cardiomegaly: Secondary | ICD-10-CM | POA: Diagnosis not present

## 2021-04-16 LAB — CBC WITH DIFFERENTIAL/PLATELET
Abs Immature Granulocytes: 0.06 10*3/uL (ref 0.00–0.07)
Basophils Absolute: 0.1 10*3/uL (ref 0.0–0.1)
Basophils Relative: 1 %
Eosinophils Absolute: 0.1 10*3/uL (ref 0.0–0.5)
Eosinophils Relative: 1 %
HCT: 31.7 % — ABNORMAL LOW (ref 39.0–52.0)
Hemoglobin: 9.7 g/dL — ABNORMAL LOW (ref 13.0–17.0)
Immature Granulocytes: 1 %
Lymphocytes Relative: 11 %
Lymphs Abs: 0.9 10*3/uL (ref 0.7–4.0)
MCH: 32.4 pg (ref 26.0–34.0)
MCHC: 30.6 g/dL (ref 30.0–36.0)
MCV: 106 fL — ABNORMAL HIGH (ref 80.0–100.0)
Monocytes Absolute: 0.7 10*3/uL (ref 0.1–1.0)
Monocytes Relative: 8 %
Neutro Abs: 7 10*3/uL (ref 1.7–7.7)
Neutrophils Relative %: 78 %
Platelets: 239 10*3/uL (ref 150–400)
RBC: 2.99 MIL/uL — ABNORMAL LOW (ref 4.22–5.81)
RDW: 13.9 % (ref 11.5–15.5)
WBC: 8.8 10*3/uL (ref 4.0–10.5)
nRBC: 0 % (ref 0.0–0.2)

## 2021-04-16 LAB — I-STAT CHEM 8, ED
BUN: 54 mg/dL — ABNORMAL HIGH (ref 6–20)
Calcium, Ion: 1.49 mmol/L — ABNORMAL HIGH (ref 1.15–1.40)
Chloride: 119 mmol/L — ABNORMAL HIGH (ref 98–111)
Creatinine, Ser: 6.7 mg/dL — ABNORMAL HIGH (ref 0.61–1.24)
Glucose, Bld: 107 mg/dL — ABNORMAL HIGH (ref 70–99)
HCT: 34 % — ABNORMAL LOW (ref 39.0–52.0)
Hemoglobin: 11.6 g/dL — ABNORMAL LOW (ref 13.0–17.0)
Potassium: 6.8 mmol/L (ref 3.5–5.1)
Sodium: 138 mmol/L (ref 135–145)
TCO2: 16 mmol/L — ABNORMAL LOW (ref 22–32)

## 2021-04-16 LAB — BASIC METABOLIC PANEL
Anion gap: 7 (ref 5–15)
BUN: 57 mg/dL — ABNORMAL HIGH (ref 6–20)
CO2: 14 mmol/L — ABNORMAL LOW (ref 22–32)
Calcium: 10.3 mg/dL (ref 8.9–10.3)
Chloride: 115 mmol/L — ABNORMAL HIGH (ref 98–111)
Creatinine, Ser: 6.2 mg/dL — ABNORMAL HIGH (ref 0.61–1.24)
GFR, Estimated: 11 mL/min — ABNORMAL LOW (ref >60)
Glucose, Bld: 113 mg/dL — ABNORMAL HIGH (ref 70–99)
Potassium: 6.7 mmol/L (ref 3.5–5.1)
Sodium: 136 mmol/L (ref 135–145)

## 2021-04-16 LAB — CBG MONITORING, ED: Glucose-Capillary: 101 mg/dL — ABNORMAL HIGH (ref 70–99)

## 2021-04-16 MED ORDER — DEXTROSE 50 % IV SOLN
1.0000 | Freq: Once | INTRAVENOUS | Status: AC
  Administered 2021-04-16: 50 mL via INTRAVENOUS
  Filled 2021-04-16: qty 50

## 2021-04-16 MED ORDER — CALCIUM GLUCONATE-NACL 1-0.675 GM/50ML-% IV SOLN
1.0000 g | Freq: Once | INTRAVENOUS | Status: AC
  Administered 2021-04-16: 1000 mg via INTRAVENOUS
  Filled 2021-04-16: qty 50

## 2021-04-16 MED ORDER — SODIUM ZIRCONIUM CYCLOSILICATE 10 G PO PACK
10.0000 g | PACK | Freq: Once | ORAL | Status: AC
  Administered 2021-04-17: 10 g via ORAL
  Filled 2021-04-16: qty 1

## 2021-04-16 MED ORDER — SODIUM BICARBONATE 8.4 % IV SOLN
50.0000 meq | Freq: Once | INTRAVENOUS | Status: AC
  Administered 2021-04-16: 50 meq via INTRAVENOUS
  Filled 2021-04-16: qty 50

## 2021-04-16 MED ORDER — FUROSEMIDE 10 MG/ML IJ SOLN
40.0000 mg | Freq: Once | INTRAMUSCULAR | Status: AC
  Administered 2021-04-16: 40 mg via INTRAVENOUS
  Filled 2021-04-16: qty 4

## 2021-04-16 MED ORDER — INSULIN ASPART 100 UNIT/ML IV SOLN
5.0000 [IU] | Freq: Once | INTRAVENOUS | Status: AC
  Administered 2021-04-16: 5 [IU] via INTRAVENOUS

## 2021-04-16 NOTE — ED Triage Notes (Signed)
Pt arrives POV for eval of hyperkalemia. Pt is a renal transplant pt, rec'd last august. Pt reports his kidney has been failing and that he has had high K+ d/t that in the past. Reports today it was 6.8 and advised to come here for that emergently. Pt denies symptoms

## 2021-04-16 NOTE — ED Provider Notes (Signed)
[facility] EMERGENCY DEPARTMENT Provider Note   CSN: [id number] Arrival date & time: 04/16/21  2144     History  Chief Complaint  Patient presents with   Abnormal Lab    Cory Dixon is a 36 y.o. male presenting for hyperkalemia.   Patient states he had routine blood work drawn today, showed hyperkalemia and he was structured to come to the ER.  He was instructed to take 40 mg of p.o. Lasix, and then instructed to take a second 1.  He has a history of hypokalemia, history of recent renal transplant and August 2022.  He does not take potassium supplementation or spironolactone.  He denies any symptoms including weakness, palpitations, cramping.  He has had good urine output. History of ESRD with kidney transplant, hypertension   HPI     Home Medications Prior to Admission medications   Medication Sig Start Date End Date Taking? Authorizing Provider  AURYXIA 1 GM 210 MG(Fe) tablet Take 630 mg by mouth 3 (three) times daily as needed. 01/02/20   [provider]  B Complex-C-Folic Acid (DIALYVITE 644) 0.8 MG TABS Take 1 tablet by mouth daily. 12/09/19   [provider]  calcitRIOL (ROCALTROL) 0.25 MCG capsule Take by mouth. 11/15/18   [provider]  Cholecalciferol 25 MCG (1000 UT) tablet Take by mouth.    [provider]  cinacalcet (SENSIPAR) 60 MG tablet Take by mouth. 11/15/18   [provider]  docusate sodium (COLACE) 100 MG capsule Take 1 capsule (100 mg total) by mouth every 12 (twelve) hours. 02/02/20   Montine Circle, PA-C  furosemide (LASIX) 80 MG tablet Take 80 mg by mouth daily. 09/08/19   [provider]  gentamicin cream (GARAMYCIN) 0.1 % Apply topically. 04/14/18   [provider]  hydrocortisone (ANUCORT-HC) 25 MG suppository Place 1 suppository (25 mg total) rectally 2 (two) times daily as needed for up to 12 doses for hemorrhoids or anal itching. 06/08/72   Defelice, Jeanett Schlein, NP   Lidocaine 0.5 % GEL Apply around the rectum, but NOT in the rectum. 02/02/20   Montine Circle, PA-C  lisinopril (PRINIVIL,ZESTRIL) 10 MG tablet Take 10 mg by mouth daily.    [provider]  Methoxy PEG-Epoetin Beta (MIRCERA IJ) Inject into the skin. 06/15/19   [provider]  PEG-KCl-NaCl-NaSulf-Na Asc-C (PLENVU) 140 g SOLR Take 1 kit by mouth as directed. Use coupon: BIN: [id number] PNC: CNRX Group: [id number] ID: [id number] 02/21/20   Thornton Park, MD  polyethylene glycol powder (GLYCOLAX/MIRALAX) 17 GM/SCOOP powder Take 17 g by mouth 2 (two) times daily. 02/02/20   Montine Circle, PA-C      Allergies    Norvasc [amlodipine], Nicotine, and Percocet [oxycodone-acetaminophen]    Review of Systems   Review of Systems  All other systems reviewed and are negative.  Physical Exam Updated Vital Signs BP (!) 142/86    Pulse 63    Temp 98.2 F (36.8 C) (Oral)    Resp 17    Ht 5' 11"  (1.803 m)    Wt 83 kg    SpO2 98%    BMI 25.52 kg/m  Physical Exam Vitals and nursing note reviewed.  Constitutional:      General: He is not in acute distress.    Appearance: Normal appearance.     Comments: nontoxic  HENT:     Head: Normocephalic and atraumatic.  Eyes:     Conjunctiva/sclera: Conjunctivae normal.     Pupils: Pupils are equal, round,  and reactive to light.  Cardiovascular:     Rate and Rhythm: Normal rate and regular rhythm.     Pulses: Normal pulses.  Pulmonary:     Effort: Pulmonary effort is normal. No respiratory distress.     Breath sounds: Normal breath sounds. No wheezing.     Comments: Speaking in full sentences.  Clear lung sounds in all fields. Abdominal:     General: There is no distension.     Palpations: Abdomen is soft. There is no mass.     Tenderness: There is no abdominal tenderness. There is no guarding or rebound.  Musculoskeletal:        General: Normal range of motion.     Cervical back: Normal range of motion and neck supple.     Right  lower leg: No edema.     Left lower leg: No edema.  Skin:    General: Skin is warm and dry.     Capillary Refill: Capillary refill takes less than 2 seconds.  Neurological:     Mental Status: He is alert and oriented to person, place, and time.  Psychiatric:        Mood and Affect: Mood and affect normal.        Speech: Speech normal.        Behavior: Behavior normal.    ED Results / Procedures / Treatments   Labs (all labs ordered are listed, but only abnormal results are displayed) Labs Reviewed  CBC WITH DIFFERENTIAL/PLATELET - Abnormal; Notable for the following components:      Result Value   RBC 2.99 (*)    Hemoglobin 9.7 (*)    HCT 31.7 (*)    MCV 106.0 (*)    All other components within normal limits  BASIC METABOLIC PANEL - Abnormal; Notable for the following components:   Potassium 6.7 (*)    Chloride 115 (*)    CO2 14 (*)    Glucose, Bld 113 (*)    BUN 57 (*)    Creatinine, Ser 6.20 (*)    GFR, Estimated 11 (*)    All other components within normal limits  I-STAT CHEM 8, ED - Abnormal; Notable for the following components:   Potassium 6.8 (*)    Chloride 119 (*)    BUN 54 (*)    Creatinine, Ser 6.70 (*)    Glucose, Bld 107 (*)    Calcium, Ion 1.49 (*)    TCO2 16 (*)    Hemoglobin 11.6 (*)    HCT 34.0 (*)    All other components within normal limits  CBG MONITORING, ED - Abnormal; Notable for the following components:   Glucose-Capillary 101 (*)    All other components within normal limits  RESP PANEL BY RT-PCR (FLU A&B, COVID) ARPGX2  GLUCOSE, RANDOM    EKG None  Radiology DG Chest Portable 1 View  Result Date: 04/16/2021 CLINICAL DATA:  Hyperkalemia. EXAM: PORTABLE CHEST 1 VIEW COMPARISON:  Chest radiograph dated 03/19/2017. FINDINGS: No focal consolidation, pleural effusion or pneumothorax. Borderline cardiomegaly. No acute osseous pathology. IMPRESSION: No acute cardiopulmonary process. Borderline cardiomegaly. Electronically Signed   By: Anner Crete M.D.   On: 04/16/2021 23:06    Procedures .Critical Care Performed by: Franchot Heidelberg, PA-C Authorized by: Franchot Heidelberg, PA-C   Critical care provider statement:    Critical care time (minutes):  35   Critical care time was exclusive of:  Separately billable procedures and treating other patients and teaching time  Critical care was necessary to treat or prevent imminent or life-threatening deterioration of the following conditions:  Metabolic crisis   Critical care was time spent personally by me on the following activities:  Blood draw for specimens, development of treatment plan with patient or surrogate, discussions with consultants, evaluation of patient's response to treatment, examination of patient, obtaining history from patient or surrogate, ordering and performing treatments and interventions, ordering and review of laboratory studies, ordering and review of radiographic studies, pulse oximetry, re-evaluation of patient's condition and review of old charts   I assumed direction of critical care for this patient from another provider in my specialty: no     Care discussed with: admitting provider      Medications Ordered in ED Medications  calcium gluconate 1 g/ 50 mL sodium chloride IVPB (1,000 mg Intravenous New Bag/Given 04/16/21 2318)  sodium zirconium cyclosilicate (LOKELMA) packet 10 g (has no administration in time range)  furosemide (LASIX) injection 40 mg (40 mg Intravenous Given 04/16/21 2313)  insulin aspart (novoLOG) injection 5 Units (5 Units Intravenous Given 04/16/21 2306)    And  dextrose 50 % solution 50 mL (50 mLs Intravenous Given 04/16/21 2305)  sodium bicarbonate injection 50 mEq (50 mEq Intravenous Given 04/16/21 2310)    ED Course/ Medical Decision Making/ A&P                           Medical Decision Making   This patient presents to the ED for concern of hyperkalemia.  This involves an extensive number of treatment options, and is a  complaint that carries with it a high risk of complications and morbidity.     Co morbidities: ESRD with kidney transplant, HTN   Additional history: External records from outside source obtained and reviewed including [facility] records regarding recent kidney transplant   Lab Tests:  I ordered, and personally interpreted labs.  The pertinent results include: Critically elevated potassium.  Kidney function similar to previous.  Bicarb is low.  Patient will need medications to lower potassium and raise bicarb.  Imaging Studies:  I ordered imaging studies including chest x-ray I independently visualized and interpreted imaging which showed no pneumonia, pneumothorax, effusion I agree with the radiologist interpretation   Cardiac Monitoring:  The patient was maintained on a cardiac monitor.  I personally viewed and interpreted the cardiac monitored which showed an underlying rhythm of: NSR with peaked T waves concerning for hyperkalemia   Medicines ordered:  I ordered medication including medications to lower potassium, bicarb  Reevaluation of the patient after these medicines showed that the patient stayed the same  Consults:  I requested consultation with the nephrologist, Dr Posey Pronto who agrees with hyperkalemia treatment and will evaluate the patient.  Recommends admission to medicine.   Dispostion:  After consideration of the diagnostic results and the patients response to treatment, I feel that the patent would benefit from admission for further hyperkalemia treatment.  Discussed with Dr. Roel Cluck from [facility] hospitalist service, patient to be admitted.   Final Clinical Impression(s) / ED Diagnoses Final diagnoses:  Hyperkalemia    Rx / DC Orders ED Discharge Orders     None         Franchot Heidelberg, PA-C 04/16/21 2327    Charlesetta Shanks, MD 06/06/21 [phone]

## 2021-04-16 NOTE — ED Provider Triage Note (Signed)
Emergency Medicine Provider Triage Evaluation Note  Cory Dixon , a 36 y.o. male  was evaluated in triage.  Pt complains of abnormal labs. Pt with h/o ESRD s/p renal transplant. He had labs drawn earlier today which revealed hyperkalemia. He reports he is asymptomatic at this time  Review of Systems  Positive: hyperkalemia Negative: Chest pain, sob  Physical Exam  BP (!) 147/85 (BP Location: Right Arm)    Pulse 62    Temp 97.8 F (36.6 C) (Oral)    Resp 19    Ht 5\' 11"  (1.803 m)    Wt 83 kg    SpO2 99%    BMI 25.52 kg/m  Gen:   Awake, no distress   Resp:  Normal effort  MSK:   Moves extremities without difficulty  Other:  Heart with rrr, lungs ctab  Medical Decision Making  Medically screening exam initiated at 9:54 PM.  Appropriate orders placed.  Cory Dixon was informed that the remainder of the evaluation will be completed by another provider, this initial triage assessment does not replace that evaluation, and the importance of remaining in the ED until their evaluation is complete.     Rodney Booze, [place] 04/16/21 2158

## 2021-04-16 NOTE — Subjective & Objective (Signed)
Sp kidney transplant has recurrent hyper kalemia K up to 6.8 today Dr. Posey Pronto is aware On tacrolimus,prograf, prednisone Has been compliant on dally Lokelma he already took it today And PRn Lasix for fluid over load

## 2021-04-16 NOTE — ED Notes (Addendum)
PA notified of critical potassium 

## 2021-04-16 NOTE — H&P (Signed)
Acy Wiles HUO:[id number] DOB: May 04, 1985 DOA: 04/16/2021   PCP: [name], MD   Outpatient Specialists:    NEphrology: [facility]    Patient arrived to ER on 04/16/21 at 2144 Referred by Attending Charlesetta Shanks, MD    Patient coming from: home Lives alone     Chief Complaint:  hyperkalemia Chief Complaint  Patient presents with   Abnormal Lab    HPI: Gagan Dillion is a 36 y.o. male with medical history significant of renal transplant August 2022, BPH, HTN  paroxysmal atrial fibrillation not anticoagulation  Presented with   hyperkalemia Sp kidney transplant has recurrent hyper kalemia K up to 6.8 today Dr. Posey Pronto is aware On tacrolimus,prograf, prednisone Has been compliant on dally Lokelma he already took it today And PRn Lasix for fluid over load  Patient has been compliant with all medications.    Has   been vaccinated against COVID and boosted had   flu shot   Initial COVID TEST  NEGATIVE   Lab Results  Component Value Date   [place] NEGATIVE 04/17/2021   [place] NOT DETECTED 01/29/2019     Regarding pertinent Chronic problems:   Renal transplant for kidney disease attributed to hypertension underwent a kidney transplant from a deceased donor on 55/20/8022 complicated by hyperkalemia, delayed graft function, atrial fibrillation with RVR, urinary retention, Staph epidermidis bacteremia and urinary tract infection, along with advanced chronic kidney disease of the transplant, initially attributed to pyelonephritis and infection and subsequent interstitial fibrosis and tubular atrophy,   He remains on Bactrim through his 1 year anniversary.  on bactrim through July 2023. BK: Negative to date.  HSV: Previously covered by [employer].  Hyperlipidemia - not on statins Lipid Panel     Component Value Date/Time   CHOL 257 (H) 12/30/2010 1138   TRIG 168.0 (H) 12/30/2010 1138   HDL 43.30 12/30/2010 1138   CHOLHDL 6 12/30/2010 1138   VLDL  33.6 12/30/2010 1138   LDLDIRECT 174.3 12/30/2010 1138     HTN on    CKD stage IV-   Estimated Creatinine Clearance: 16.2 mL/min (A) (by C-G formula based on SCr of 6.7 mg/dL (H)).  Lab Results  Component Value Date   CREATININE 6.70 (H) 04/16/2021   CREATININE 6.20 (H) 04/16/2021   CREATININE 11.67 (H) 09/12/2020    Chronic anemia - baseline hg Hemoglobin & Hematocrit  Recent Labs    09/12/20 1041 04/16/21 2201 04/16/21 2208  HGB 11.2* 9.7* 11.6*     While in ER:    Noted to be hyperkalemic was given a dose of Lokelma D50 insulin and bicarb  Patient became diaphoretic and jittery his blood sugar was found to be in the 40s.  He was given a dose of D25 and p.o. intake after which his blood sugar improved to 90s.  He is currently symptomatic we will continue to monitor Ordered     CXR -  NON acute question mild cardiomegaly  mild cardiomegaly    Following Medications were ordered in ER: Medications  calcium gluconate 1 g/ 50 mL sodium chloride IVPB (has no administration in time range)  sodium zirconium cyclosilicate (LOKELMA) packet 10 g (has no administration in time range)  furosemide (LASIX) injection 40 mg (40 mg Intravenous Given 04/16/21 2313)  insulin aspart (novoLOG) injection 5 Units (5 Units Intravenous Given 04/16/21 2306)    And  dextrose 50 % solution 50 mL (50 mLs Intravenous Given 04/16/21 2305)  sodium bicarbonate injection 50 mEq (50 mEq Intravenous  Given 04/16/21 2310)    _______________________________________________________ ER Provider Called:    nephrology  Dr.Patel They Recommend admit to medicine   Will see in AM   hold bactrim for tonight   ED Triage Vitals  Enc Vitals Group     BP 04/16/21 2149 (!) 147/85     Pulse Rate 04/16/21 2149 62     Resp 04/16/21 2149 19     Temp 04/16/21 2149 97.8 F (36.6 C)     Temp Source 04/16/21 2149 Oral     SpO2 04/16/21 2149 99 %     Weight 04/16/21 2153 182 lb 15.7 oz (83 kg)     Height 04/16/21 2153 5'  11" (1.803 m)     Head Circumference --      Peak Flow --      Pain Score 04/16/21 2153 0     Pain Loc --      Pain Edu? --      Excl. in [place]? --   TMAX(24)@     _________________________________________ Significant initial  Findings: Abnormal Labs Reviewed  CBC WITH DIFFERENTIAL/PLATELET - Abnormal; Notable for the following components:      Result Value   RBC 2.99 (*)    Hemoglobin 9.7 (*)    HCT 31.7 (*)    MCV 106.0 (*)    All other components within normal limits  BASIC METABOLIC PANEL - Abnormal; Notable for the following components:   Potassium 6.7 (*)    Chloride 115 (*)    CO2 14 (*)    Glucose, Bld 113 (*)    BUN 57 (*)    Creatinine, Ser 6.20 (*)    GFR, Estimated 11 (*)    All other components within normal limits  I-STAT CHEM 8, ED - Abnormal; Notable for the following components:   Potassium 6.8 (*)    Chloride 119 (*)    BUN 54 (*)    Creatinine, Ser 6.70 (*)    Glucose, Bld 107 (*)    Calcium, Ion 1.49 (*)    TCO2 16 (*)    Hemoglobin 11.6 (*)    HCT 34.0 (*)    All other components within normal limits  CBG MONITORING, ED - Abnormal; Notable for the following components:   Glucose-Capillary 101 (*)    All other components within normal limits     _____________________  ECG: Ordered Personally reviewed by me showing: HR : 67 Rhythm:   NSR   no evidence of ischemic changes QTC 417    The recent clinical data is shown below. Vitals:   04/16/21 2149 04/16/21 2153 04/16/21 2229 04/16/21 2230  BP: (!) 147/85  137/85 137/85  Pulse: 62  97 (!) 57  Resp: 19  17 19   Temp: 97.8 F (36.6 C)  98.2 F (36.8 C)   TempSrc: Oral  Oral   SpO2: 99%  98% 99%  Weight:  83 kg    Height:  5' 11"  (1.803 m)      WBC     Component Value Date/Time   WBC 8.8 04/16/2021 2201   LYMPHSABS 0.9 04/16/2021 2201   MONOABS 0.7 04/16/2021 2201   EOSABS 0.1 04/16/2021 2201   BASOSABS 0.1 04/16/2021 2201     Results for orders placed or performed during the  hospital encounter of 04/16/21  Resp Panel by RT-PCR (Flu A&B, Covid) Nasopharyngeal Swab     Status: None   Collection Time: 04/17/21  1:38 AM   Specimen: Nasopharyngeal Swab; Nasopharyngeal(NP)  swabs in vial transport medium  Result Value Ref Range Status   SARS Coronavirus 2 by RT PCR NEGATIVE NEGATIVE Final         Influenza A by PCR NEGATIVE NEGATIVE Final   Influenza B by PCR NEGATIVE NEGATIVE Final          _______________________________________________ Hospitalist was called for admission for hyperkalemia  The following Work up has been ordered so far:  Orders Placed This Encounter  Procedures   Resp Panel by RT-PCR (Flu A&B, Covid) Nasopharyngeal Swab   DG Chest Portable 1 View   CBC with Differential   Basic metabolic panel   Glucose, random   Place Patient on a Cardiac Monitor   Initiate [place] Fluid Protocol   Consult to nephrology   Consult to hospitalist   I-stat chem 8, ED (not at [facility])   CBG monitoring, ED   ED EKG   EKG 12-Lead     OTHER Significant initial  Findings:  labs showing:    Recent Labs  Lab 04/16/21 2201 04/16/21 2208 04/17/21 0201  NA 136 138 139  K 6.7* 6.8* 5.5*  CO2 14*  --  15*  GLUCOSE 113* 107* 130*  BUN 57* 54* 57*  CREATININE 6.20* 6.70* 6.18*  CALCIUM 10.3  --  10.1    Cr   stable,    Lab Results  Component Value Date   CREATININE 6.70 (H) 04/16/2021   CREATININE 6.20 (H) 04/16/2021   CREATININE 11.67 (H) 09/12/2020    Recent Labs  Lab 04/17/21 0201  AST 13*  ALT 7  ALKPHOS 93  BILITOT 0.7  PROT 7.0  ALBUMIN 4.2   Lab Results  Component Value Date   CALCIUM 10.3 04/16/2021          Plt: Lab Results  Component Value Date   PLT 239 04/16/2021         Recent Labs  Lab 04/16/21 2201 04/16/21 2208  WBC 8.8  --   NEUTROABS 7.0  --   HGB 9.7* 11.6*  HCT 31.7* 34.0*  MCV 106.0*  --   PLT 239  --     HG/HCT   stable,      Component Value Date/Time   HGB 11.6 (L) 04/16/2021 2208    HCT 34.0 (L) 04/16/2021 2208   MCV 106.0 (H) 04/16/2021 2201      Cardiac Panel (last 3 results) Recent Labs    04/17/21 0201  CKTOTAL 103    .car BNP (last 3 results) No results for input(s): BNP in the last 8760 hours.    DM  labs:  HbA1C: No results for input(s): HGBA1C in the last 8760 hours.     CBG (last 3)  Recent Labs    04/16/21 2255  GLUCAP 101*          Cultures: No results found for: SDES, SPECREQUEST, CULT, REPTSTATUS   Radiological Exams on Admission: DG Chest Portable 1 View  Result Date: 04/16/2021 CLINICAL DATA:  Hyperkalemia. EXAM: PORTABLE CHEST 1 VIEW COMPARISON:  Chest radiograph dated 03/19/2017. FINDINGS: No focal consolidation, pleural effusion or pneumothorax. Borderline cardiomegaly. No acute osseous pathology. IMPRESSION: No acute cardiopulmonary process. Borderline cardiomegaly. Electronically Signed   By: Anner Crete M.D.   On: 04/16/2021 23:06   _______________________________________________________________________________________________________ Latest   Blood pressure 137/85, pulse (!) 57, temperature 98.2 F (36.8 C), temperature source Oral, resp. rate 19, height 5' 11"  (1.803 m), weight 83 kg, SpO2 99 %.   Vitals  labs  and radiology finding personally reviewed  Review of Systems:    Pertinent positives include:   fatigue, Constitutional:  No weight loss, night sweats, Fevers, chills,  weight loss  HEENT:  No headaches, Difficulty swallowing,Tooth/dental problems,Sore throat,  No sneezing, itching, ear ache, nasal congestion, post nasal drip,  Cardio-vascular:  No chest pain, Orthopnea, PND, anasarca, dizziness, palpitations.no Bilateral lower extremity swelling  GI:  No heartburn, indigestion, abdominal pain, nausea, vomiting, diarrhea, change in bowel habits, loss of appetite, melena, blood in stool, hematemesis Resp:  no shortness of breath at rest. No dyspnea on exertion, No excess mucus, no productive cough, No  non-productive cough, No coughing up of blood.No change in color of mucus.No wheezing. Skin:  no rash or lesions. No jaundice GU:  no dysuria, change in color of urine, no urgency or frequency. No straining to urinate.  No flank pain.  Musculoskeletal:  No joint pain or no joint swelling. No decreased range of motion. No back pain.  Psych:  No change in mood or affect. No depression or anxiety. No memory loss.  Neuro: no localizing neurological complaints, no tingling, no weakness, no double vision, no gait abnormality, no slurred speech, no confusion  All systems reviewed and apart from [place] all are negative _______________________________________________________________________________________________ Past Medical History:   Past Medical History:  Diagnosis Date   Anemia    Chicken pox    CKD (chronic kidney disease), stage V (HCC)    Headache(784.0)    Hyperkalemia 03/2017   Hypertension    Migraine    Peritoneal dialysis status ([place])       Past Surgical History:  Procedure Laterality Date   DIALYSIS FISTULA CREATION     KIDNEY TRANSPLANT     PERITONEAL CATHETER INSERTION      Social History:  Ambulatory   independently      reports that he has been smoking cigarettes. He has a 1.60 pack-year smoking history. He has never used smokeless tobacco. He reports that he does not drink alcohol and does not use drugs.     Family History:   Family History  Problem Relation Age of Onset   Arthritis Mother    Arthritis Maternal Grandmother    Hypertension Maternal Grandmother    Depression Maternal Grandmother    Breast cancer Maternal Grandmother    Hypertension Maternal Grandfather    Hypertension Paternal Grandmother    Depression Paternal Grandmother    Hypertension Paternal Grandfather    Diabetes Brother        type 1 diabetes   Liver cancer Maternal Aunt    Prostate cancer Maternal Uncle    Colon cancer Neg Hx    Stomach cancer Neg Hx    Esophageal  cancer Neg Hx    Pancreatic cancer Neg Hx    ______________________________________________________________________________________________ Allergies: Allergies  Allergen Reactions   Norvasc [Amlodipine] Other (See Comments)   Nicotine Other (See Comments)    Experiences nightmare   Percocet [Oxycodone-Acetaminophen] Itching and Other (See Comments)    constipation     Prior to Admission medications   Medication Sig Start Date End Date Taking? Authorizing Provider  AURYXIA 1 GM 210 MG(Fe) tablet Take 630 mg by mouth 3 (three) times daily as needed. 01/02/20   [provider]  B Complex-C-Folic Acid (DIALYVITE 235) 0.8 MG TABS Take 1 tablet by mouth daily. 12/09/19   [provider]  calcitRIOL (ROCALTROL) 0.25 MCG capsule Take by mouth. 11/15/18   [provider]  Cholecalciferol 25 MCG (1000  UT) tablet Take by mouth.    [provider]  cinacalcet (SENSIPAR) 60 MG tablet Take by mouth. 11/15/18   [provider]  docusate sodium (COLACE) 100 MG capsule Take 1 capsule (100 mg total) by mouth every 12 (twelve) hours. 02/02/20   Montine Circle, PA-C  furosemide (LASIX) 80 MG tablet Take 80 mg by mouth daily. 09/08/19   [provider]  gentamicin cream (GARAMYCIN) 0.1 % Apply topically. 04/14/18   [provider]  hydrocortisone (ANUCORT-HC) 25 MG suppository Place 1 suppository (25 mg total) rectally 2 (two) times daily as needed for up to 12 doses for hemorrhoids or anal itching. 21/2/24   Defelice, Jeanett Schlein, NP  Lidocaine 0.5 % GEL Apply around the rectum, but NOT in the rectum. 02/02/20   Montine Circle, PA-C  lisinopril (PRINIVIL,ZESTRIL) 10 MG tablet Take 10 mg by mouth daily.    [provider]  Methoxy PEG-Epoetin Beta (MIRCERA IJ) Inject into the skin. 06/15/19   [provider]  PEG-KCl-NaCl-NaSulf-Na Asc-C (PLENVU) 140 g SOLR Take 1 kit by mouth as directed. Use coupon: BIN: 825003 PNC: CNRX Group:  [id number] ID: [id number] 02/21/20   Thornton Park, MD  polyethylene glycol powder (GLYCOLAX/MIRALAX) 17 GM/SCOOP powder Take 17 g by mouth 2 (two) times daily. 02/02/20   Montine Circle, PA-C    ___________________________________________________________________________________________________ Physical Exam: Vitals with BMI 04/16/2021 04/16/2021 04/16/2021  Height - - 5' 11"   Weight - - 183 lbs  BMI - - 34.91  Systolic 791 505 -  Diastolic 85 85 -  Pulse 57 97 -     1. General:  in No  Acute distress   Chronically ill   -appearing 2. Psychological: Alert and   Oriented 3. Head/ENT:  Dry Mucous Membranes                          Head Non traumatic, neck supple                      Poor Dentition 4. SKIN:  decreased Skin turgor,  Skin clean Dry and intact no rash 5. Heart: Regular rate and rhythm no  Murmur, no Rub or gallop 6. Lungs:   no wheezes or crackles   7. Abdomen: Soft,  non-tender, Non distended  bowel sounds present 8. Lower extremities: no clubbing, cyanosis, no  edema 9. Neurologically Grossly intact, moving all 4 extremities equally  10. MSK: Normal range of motion    Chart has been reviewed  ______________________________________________________________________________________________  Assessment/Plan 36 y.o. male with medical history significant of renal transplant August 2022, BPH, HTN  paroxysmal atrial fibrillation not anticoagulation   Admitted for hyperkalemia  Present on Admission:  Hyperkalemia  Abnormal EKG  HTN (hypertension)  Hypoglycemia due to insulin     HTN (hypertension) Hold coreg given bradycardia  Abnormal EKG Peaked T waves in the setting of Hyperkalemia,hyperkalemia treated and improved continue to monitor  History of atrial fibrillation Currently in sinus rhythm not on anticoagulation Hold Coreg for tonight given bradycardia  Hyperkalemia In the setting of CKD and Bactrim. Continue home dose of Lokelma. Potassium  now down to 5.5 continue to monitor appreciate nephrology consult Monitor on telemetry   Renal transplant recipient   continue home medications Myfortic 360 twice daily, Prograf 6 in AM and 5 in p.m. and prednisone 5 mg a day. Appreciate nephrology consult   Hypoglycemia due to insulin In the setting of getting insulin for hyperkalemia  now improved after IV glucose and p.o. intake.  Continue to monitor order serial CBGs  Bradycardia hold Coreg  Other plan as per orders.  DVT prophylaxis:  SCD     Code Status:    Code Status: Prior FULL CODE  as per patient  I had personally discussed CODE STATUS with patient     Family Communication:   Family not at  Bedside    Disposition Plan:      To home once workup is complete and patient is stable   Following barriers for discharge:                            Electrolytes corrected                                                        Will need consultants to evaluate patient prior to discharge                        Would benefit from PT/OT eval prior to DC  Ordered                                       Consults called: nephrology   Admission status:  ED Disposition     ED Disposition  [place]: [place] [100100]  Level of Care: Progressive [102]  Admit to Progressive based on following criteria: NEPHROLOGY stable condition requiring close monitoring for AKI, requiring Hemodialysis or Peritoneal Dialysis either from expected electrolyte imbalance, acidosis, or fluid overload that can be managed by NIPPV or high flow oxygen.  May place patient in observation at [facility] or [place] if equivalent level of care is available:: No  Covid Evaluation: Asymptomatic Screening Protocol (No Symptoms)  Diagnosis: Hyperkalemia [122482]  Admitting Physician: Toy Baker [[id number]]  Attending Physician: Toy Baker [[id number]]           Obs       Level of  care       progressive tele indefinitely please discontinue once patient no longer qualifies COVID-19 Labs    Lab Results  Component Value Date   [place] 04/17/2021     Precautions: admitted as   Covid Negative      Temesha Queener 04/17/2021 , 4:47 AM    [facility]     after 2 AM please page floor coverage PA If 7AM-7PM, please contact the day team taking care of the patient using [email or web address]   Patient was evaluated in the context of the global COVID-19 pandemic, which necessitated consideration that the patient might be at risk for infection with the SARS-CoV-2 virus that causes COVID-19. Institutional protocols and algorithms that pertain to the evaluation of patients at risk for COVID-19 are in a state of rapid change based on information released by regulatory bodies including the CDC and federal and state organizations. These policies and algorithms were followed during the patient's care.

## 2021-04-17 DIAGNOSIS — T383X5A Adverse effect of insulin and oral hypoglycemic [antidiabetic] drugs, initial encounter: Secondary | ICD-10-CM | POA: Diagnosis present

## 2021-04-17 DIAGNOSIS — E160 Drug-induced hypoglycemia without coma: Secondary | ICD-10-CM | POA: Diagnosis present

## 2021-04-17 LAB — CBC WITH DIFFERENTIAL/PLATELET
Abs Immature Granulocytes: 0.06 10*3/uL (ref 0.00–0.07)
Basophils Absolute: 0.1 10*3/uL (ref 0.0–0.1)
Basophils Relative: 1 %
Eosinophils Absolute: 0.1 10*3/uL (ref 0.0–0.5)
Eosinophils Relative: 1 %
HCT: 29.5 % — ABNORMAL LOW (ref 39.0–52.0)
Hemoglobin: 9.4 g/dL — ABNORMAL LOW (ref 13.0–17.0)
Immature Granulocytes: 1 %
Lymphocytes Relative: 11 %
Lymphs Abs: 0.9 10*3/uL (ref 0.7–4.0)
MCH: 33.5 pg (ref 26.0–34.0)
MCHC: 31.9 g/dL (ref 30.0–36.0)
MCV: 105 fL — ABNORMAL HIGH (ref 80.0–100.0)
Monocytes Absolute: 0.6 10*3/uL (ref 0.1–1.0)
Monocytes Relative: 7 %
Neutro Abs: 6.9 10*3/uL (ref 1.7–7.7)
Neutrophils Relative %: 79 %
Platelets: 224 10*3/uL (ref 150–400)
RBC: 2.81 MIL/uL — ABNORMAL LOW (ref 4.22–5.81)
RDW: 13.5 % (ref 11.5–15.5)
WBC: 8.6 10*3/uL (ref 4.0–10.5)
nRBC: 0 % (ref 0.0–0.2)

## 2021-04-17 LAB — HIV ANTIBODY (ROUTINE TESTING W REFLEX): HIV Screen 4th Generation wRfx: NONREACTIVE

## 2021-04-17 LAB — BASIC METABOLIC PANEL
Anion gap: 10 (ref 5–15)
BUN: 57 mg/dL — ABNORMAL HIGH (ref 6–20)
CO2: 15 mmol/L — ABNORMAL LOW (ref 22–32)
Calcium: 10.1 mg/dL (ref 8.9–10.3)
Chloride: 114 mmol/L — ABNORMAL HIGH (ref 98–111)
Creatinine, Ser: 6.18 mg/dL — ABNORMAL HIGH (ref 0.61–1.24)
GFR, Estimated: 11 mL/min — ABNORMAL LOW (ref >60)
Glucose, Bld: 130 mg/dL — ABNORMAL HIGH (ref 70–99)
Potassium: 5.5 mmol/L — ABNORMAL HIGH (ref 3.5–5.1)
Sodium: 139 mmol/L (ref 135–145)

## 2021-04-17 LAB — COMPREHENSIVE METABOLIC PANEL
ALT: 8 U/L (ref 0–44)
AST: 13 U/L — ABNORMAL LOW (ref 15–41)
Albumin: 3.9 g/dL (ref 3.5–5.0)
Alkaline Phosphatase: 99 U/L (ref 38–126)
Anion gap: 10 (ref 5–15)
BUN: 57 mg/dL — ABNORMAL HIGH (ref 6–20)
CO2: 15 mmol/L — ABNORMAL LOW (ref 22–32)
Calcium: 9.7 mg/dL (ref 8.9–10.3)
Chloride: 112 mmol/L — ABNORMAL HIGH (ref 98–111)
Creatinine, Ser: 6.05 mg/dL — ABNORMAL HIGH (ref 0.61–1.24)
GFR, Estimated: 12 mL/min — ABNORMAL LOW (ref >60)
Glucose, Bld: 162 mg/dL — ABNORMAL HIGH (ref 70–99)
Potassium: 5 mmol/L (ref 3.5–5.1)
Sodium: 137 mmol/L (ref 135–145)
Total Bilirubin: 0.7 mg/dL (ref 0.3–1.2)
Total Protein: 6.7 g/dL (ref 6.5–8.1)

## 2021-04-17 LAB — MAGNESIUM: Magnesium: 1.8 mg/dL (ref 1.7–2.4)

## 2021-04-17 LAB — CBG MONITORING, ED
Glucose-Capillary: 145 mg/dL — ABNORMAL HIGH (ref 70–99)
Glucose-Capillary: 148 mg/dL — ABNORMAL HIGH (ref 70–99)
Glucose-Capillary: 164 mg/dL — ABNORMAL HIGH (ref 70–99)
Glucose-Capillary: 43 mg/dL — CL (ref 70–99)
Glucose-Capillary: 99 mg/dL (ref 70–99)

## 2021-04-17 LAB — HEPATIC FUNCTION PANEL
ALT: 7 U/L (ref 0–44)
AST: 13 U/L — ABNORMAL LOW (ref 15–41)
Albumin: 4.2 g/dL (ref 3.5–5.0)
Alkaline Phosphatase: 93 U/L (ref 38–126)
Bilirubin, Direct: <0.1 mg/dL (ref 0.0–0.2)
Total Bilirubin: 0.7 mg/dL (ref 0.3–1.2)
Total Protein: 7 g/dL (ref 6.5–8.1)

## 2021-04-17 LAB — PHOSPHORUS: Phosphorus: 4.3 mg/dL (ref 2.5–4.6)

## 2021-04-17 LAB — RESP PANEL BY RT-PCR (FLU A&B, COVID) ARPGX2
Influenza A by PCR: NEGATIVE
Influenza B by PCR: NEGATIVE
SARS Coronavirus 2 by RT PCR: NEGATIVE

## 2021-04-17 LAB — CK: Total CK: 103 U/L (ref 49–397)

## 2021-04-17 LAB — TSH: TSH: 1.004 u[IU]/mL (ref 0.350–4.500)

## 2021-04-17 MED ORDER — DEXTROSE 50 % IV SOLN: 1.0000 | Freq: Once | INTRAVENOUS | Status: AC

## 2021-04-17 MED ORDER — PREDNISOLONE 5 MG PO TABS: 5.0000 mg | ORAL_TABLET | Freq: Every day | ORAL | Status: DC

## 2021-04-17 MED ORDER — TAMSULOSIN HCL 0.4 MG PO CAPS: 0.4000 mg | ORAL_CAPSULE | Freq: Every day | ORAL | Status: DC

## 2021-04-17 MED ORDER — SODIUM ZIRCONIUM CYCLOSILICATE 10 G PO PACK: 10.0000 g | PACK | Freq: Two times a day (BID) | ORAL | Status: DC

## 2021-04-17 MED ORDER — MYCOPHENOLATE SODIUM 180 MG PO TBEC
360.0000 mg | DELAYED_RELEASE_TABLET | Freq: Two times a day (BID) | ORAL | Status: DC
  Filled 2021-04-17 (×2): qty 2

## 2021-04-17 MED ORDER — SODIUM BICARBONATE 650 MG PO TABS
650.0000 mg | ORAL_TABLET | Freq: Two times a day (BID) | ORAL | Status: DC
  Filled 2021-04-17: qty 1

## 2021-04-17 MED ORDER — TACROLIMUS 1 MG PO CAPS: 6.0000 mg | ORAL_CAPSULE | ORAL | Status: DC

## 2021-04-17 MED ORDER — SODIUM CHLORIDE 0.9 % IV SOLN
75.0000 mL/h | INTRAVENOUS | Status: DC
  Administered 2021-04-17: 75 mL/h via INTRAVENOUS

## 2021-04-17 MED ORDER — ASPIRIN 81 MG PO CHEW: 81.0000 mg | CHEWABLE_TABLET | Freq: Every day | ORAL | Status: DC

## 2021-04-17 MED ORDER — TACROLIMUS 1 MG PO CAPS
5.0000 mg | ORAL_CAPSULE | Freq: Every evening | ORAL | Status: DC
  Filled 2021-04-17: qty 5

## 2021-04-17 MED ORDER — DEXTROSE 50 % IV SOLN
INTRAVENOUS | Status: AC
  Administered 2021-04-17: 25 mL via INTRAVENOUS
  Filled 2021-04-17: qty 50

## 2021-04-17 MED ORDER — FAMOTIDINE 20 MG PO TABS
40.0000 mg | ORAL_TABLET | Freq: Two times a day (BID) | ORAL | Status: DC
  Filled 2021-04-17: qty 2

## 2021-04-17 NOTE — Assessment & Plan Note (Signed)
In the setting of getting insulin for hyperkalemia now improved after IV glucose and p.o. intake.  Continue to monitor order serial CBGs

## 2021-04-17 NOTE — Assessment & Plan Note (Addendum)
In the setting of CKD and Bactrim. Continue home dose of Lokelma. Potassium now down to 5.5 continue to monitor appreciate nephrology consult Monitor on telemetry

## 2021-04-17 NOTE — ED Notes (Signed)
Per MD Doutova - I ordred repat BMEt at 2 so we know if is K went down pls call Dr. Bridgett Larsson with K result if above 5

## 2021-04-17 NOTE — ED Notes (Signed)
MD Bridgett Larsson notified of pt repeat potassium via page and secure chat

## 2021-04-17 NOTE — ED Notes (Signed)
MD Doutova at bedside  

## 2021-04-17 NOTE — Assessment & Plan Note (Signed)
Hold coreg given bradycardia

## 2021-04-17 NOTE — Assessment & Plan Note (Signed)
Peaked T waves in the setting of Hyperkalemia,hyperkalemia treated and improved continue to monitor

## 2021-04-17 NOTE — ED Notes (Signed)
Pt called out to say he was not feeling well - pt very diaphoretic - CBG 43 - pt to be given snacks - MD Doutova notified

## 2021-04-17 NOTE — Discharge Summary (Signed)
°  Brief discharge summary:  Patient left AMA  I have been notified by ED RN, that patient has left AMA, he did not want to wait for any discussion with any physicians, patient has already left by the time I was notified, he was unwilling to wait, PIV has been discontinued before he left.   Discharge diagnosis -Hyperkalemia, which has resolved by time of discharge with potassium of 5  Hypertension History of A. fib Bradycardia Renal transplant recipient Hypoglycemia due to insulin  Phillips Climes MD

## 2021-04-17 NOTE — Assessment & Plan Note (Addendum)
continue home medications Myfortic 360 twice daily, Prograf 6 in AM and 5 in p.m. and prednisone 5 mg a day. Appreciate nephrology consult

## 2021-04-17 NOTE — ED Notes (Signed)
Pt requesting to leave AMA, states he wants to go to his transplant team at [facility] and does not want to stay at this facility any longer. Pt refusing to stay long enough for inpatient team to talk to him. PIV removed, pt agreed to sign AMA form. Pt ambulated out of department.

## 2021-04-17 NOTE — Assessment & Plan Note (Signed)
Currently in sinus rhythm not on anticoagulation Hold Coreg for tonight given bradycardia

## 2021-04-17 NOTE — ED Notes (Signed)
Pt resting quietly in bed. Respirations even and unlabored. Bed in lowest position and locked with bed rails up X 2. No S/S of distress noted.

## 2021-04-26 DIAGNOSIS — I480 Paroxysmal atrial fibrillation: Secondary | ICD-10-CM | POA: Diagnosis not present

## 2021-04-26 DIAGNOSIS — Z940 Kidney transplant status: Secondary | ICD-10-CM | POA: Diagnosis not present

## 2021-04-26 DIAGNOSIS — Z01818 Encounter for other preprocedural examination: Secondary | ICD-10-CM | POA: Diagnosis not present

## 2021-04-26 DIAGNOSIS — Z7682 Awaiting organ transplant status: Secondary | ICD-10-CM | POA: Diagnosis not present

## 2021-04-26 DIAGNOSIS — Z1159 Encounter for screening for other viral diseases: Secondary | ICD-10-CM | POA: Diagnosis not present

## 2021-04-26 DIAGNOSIS — N186 End stage renal disease: Secondary | ICD-10-CM | POA: Diagnosis not present

## 2021-04-26 DIAGNOSIS — Z87891 Personal history of nicotine dependence: Secondary | ICD-10-CM | POA: Diagnosis not present

## 2021-04-26 DIAGNOSIS — Z992 Dependence on renal dialysis: Secondary | ICD-10-CM | POA: Diagnosis not present

## 2021-04-26 DIAGNOSIS — I120 Hypertensive chronic kidney disease with stage 5 chronic kidney disease or end stage renal disease: Secondary | ICD-10-CM | POA: Diagnosis not present

## 2021-04-26 DIAGNOSIS — Z713 Dietary counseling and surveillance: Secondary | ICD-10-CM | POA: Diagnosis not present

## 2021-04-26 DIAGNOSIS — I10 Essential (primary) hypertension: Secondary | ICD-10-CM | POA: Diagnosis not present

## 2021-04-26 DIAGNOSIS — E875 Hyperkalemia: Secondary | ICD-10-CM | POA: Diagnosis not present

## 2021-04-26 DIAGNOSIS — D849 Immunodeficiency, unspecified: Secondary | ICD-10-CM | POA: Diagnosis not present

## 2021-05-06 DIAGNOSIS — Z5181 Encounter for therapeutic drug level monitoring: Secondary | ICD-10-CM | POA: Diagnosis not present

## 2021-05-06 DIAGNOSIS — B259 Cytomegaloviral disease, unspecified: Secondary | ICD-10-CM | POA: Diagnosis not present

## 2021-05-06 DIAGNOSIS — Z940 Kidney transplant status: Secondary | ICD-10-CM | POA: Diagnosis not present

## 2021-05-06 DIAGNOSIS — B349 Viral infection, unspecified: Secondary | ICD-10-CM | POA: Diagnosis not present

## 2021-05-09 DIAGNOSIS — Z008 Encounter for other general examination: Secondary | ICD-10-CM | POA: Diagnosis not present

## 2021-05-09 DIAGNOSIS — Z7682 Awaiting organ transplant status: Secondary | ICD-10-CM | POA: Diagnosis not present

## 2021-05-09 DIAGNOSIS — F17290 Nicotine dependence, other tobacco product, uncomplicated: Secondary | ICD-10-CM | POA: Diagnosis not present

## 2021-05-11 ENCOUNTER — Encounter (HOSPITAL_COMMUNITY): Payer: Self-pay | Admitting: Emergency Medicine

## 2021-05-11 ENCOUNTER — Inpatient Hospital Stay (HOSPITAL_COMMUNITY)
Admission: EM | Admit: 2021-05-11 | Discharge: 2021-05-13 | DRG: 640 | Discharge status: Against Medical Advice | Payer: Medicare Other | Attending: Internal Medicine | Admitting: Internal Medicine

## 2021-05-11 ENCOUNTER — Other Ambulatory Visit: Payer: Self-pay

## 2021-05-11 DIAGNOSIS — Z79899 Other long term (current) drug therapy: Secondary | ICD-10-CM

## 2021-05-11 DIAGNOSIS — R001 Bradycardia, unspecified: Secondary | ICD-10-CM | POA: Diagnosis not present

## 2021-05-11 DIAGNOSIS — Z20822 Contact with and (suspected) exposure to covid-19: Secondary | ICD-10-CM | POA: Diagnosis present

## 2021-05-11 DIAGNOSIS — E872 Acidosis, unspecified: Secondary | ICD-10-CM | POA: Diagnosis not present

## 2021-05-11 DIAGNOSIS — Z885 Allergy status to narcotic agent status: Secondary | ICD-10-CM

## 2021-05-11 DIAGNOSIS — F17210 Nicotine dependence, cigarettes, uncomplicated: Secondary | ICD-10-CM | POA: Diagnosis present

## 2021-05-11 DIAGNOSIS — R112 Nausea with vomiting, unspecified: Principal | ICD-10-CM

## 2021-05-11 DIAGNOSIS — I120 Hypertensive chronic kidney disease with stage 5 chronic kidney disease or end stage renal disease: Secondary | ICD-10-CM | POA: Diagnosis present

## 2021-05-11 DIAGNOSIS — Z792 Long term (current) use of antibiotics: Secondary | ICD-10-CM | POA: Diagnosis not present

## 2021-05-11 DIAGNOSIS — R197 Diarrhea, unspecified: Secondary | ICD-10-CM | POA: Diagnosis present

## 2021-05-11 DIAGNOSIS — T8619 Other complication of kidney transplant: Secondary | ICD-10-CM | POA: Diagnosis not present

## 2021-05-11 DIAGNOSIS — E875 Hyperkalemia: Principal | ICD-10-CM

## 2021-05-11 DIAGNOSIS — Z940 Kidney transplant status: Secondary | ICD-10-CM | POA: Diagnosis not present

## 2021-05-11 DIAGNOSIS — I959 Hypotension, unspecified: Secondary | ICD-10-CM | POA: Diagnosis not present

## 2021-05-11 DIAGNOSIS — N179 Acute kidney failure, unspecified: Secondary | ICD-10-CM | POA: Diagnosis not present

## 2021-05-11 DIAGNOSIS — D8481 Immunodeficiency due to conditions classified elsewhere: Secondary | ICD-10-CM | POA: Diagnosis not present

## 2021-05-11 DIAGNOSIS — I10 Essential (primary) hypertension: Secondary | ICD-10-CM | POA: Diagnosis not present

## 2021-05-11 DIAGNOSIS — E860 Dehydration: Secondary | ICD-10-CM | POA: Diagnosis not present

## 2021-05-11 DIAGNOSIS — Z7982 Long term (current) use of aspirin: Secondary | ICD-10-CM

## 2021-05-11 DIAGNOSIS — Z79624 Long term (current) use of inhibitors of nucleotide synthesis: Secondary | ICD-10-CM

## 2021-05-11 DIAGNOSIS — Z91048 Other nonmedicinal substance allergy status: Secondary | ICD-10-CM

## 2021-05-11 DIAGNOSIS — N189 Chronic kidney disease, unspecified: Secondary | ICD-10-CM | POA: Diagnosis present

## 2021-05-11 DIAGNOSIS — N3289 Other specified disorders of bladder: Secondary | ICD-10-CM | POA: Diagnosis not present

## 2021-05-11 DIAGNOSIS — I480 Paroxysmal atrial fibrillation: Secondary | ICD-10-CM | POA: Diagnosis not present

## 2021-05-11 DIAGNOSIS — D631 Anemia in chronic kidney disease: Secondary | ICD-10-CM | POA: Diagnosis present

## 2021-05-11 DIAGNOSIS — N400 Enlarged prostate without lower urinary tract symptoms: Secondary | ICD-10-CM | POA: Diagnosis present

## 2021-05-11 DIAGNOSIS — N186 End stage renal disease: Secondary | ICD-10-CM | POA: Diagnosis present

## 2021-05-11 DIAGNOSIS — Y830 Surgical operation with transplant of whole organ as the cause of abnormal reaction of the patient, or of later complication, without mention of misadventure at the time of the procedure: Secondary | ICD-10-CM | POA: Diagnosis present

## 2021-05-11 DIAGNOSIS — N281 Cyst of kidney, acquired: Secondary | ICD-10-CM | POA: Diagnosis not present

## 2021-05-11 DIAGNOSIS — Z8249 Family history of ischemic heart disease and other diseases of the circulatory system: Secondary | ICD-10-CM | POA: Diagnosis not present

## 2021-05-11 DIAGNOSIS — Z888 Allergy status to other drugs, medicaments and biological substances status: Secondary | ICD-10-CM | POA: Diagnosis not present

## 2021-05-11 DIAGNOSIS — N185 Chronic kidney disease, stage 5: Secondary | ICD-10-CM | POA: Diagnosis not present

## 2021-05-11 LAB — CBC
HCT: 30.5 % — ABNORMAL LOW (ref 39.0–52.0)
Hemoglobin: 8.9 g/dL — ABNORMAL LOW (ref 13.0–17.0)
MCH: 31.1 pg (ref 26.0–34.0)
MCHC: 29.2 g/dL — ABNORMAL LOW (ref 30.0–36.0)
MCV: 106.6 fL — ABNORMAL HIGH (ref 80.0–100.0)
Platelets: 167 10*3/uL (ref 150–400)
RBC: 2.86 MIL/uL — ABNORMAL LOW (ref 4.22–5.81)
RDW: 13.6 % (ref 11.5–15.5)
WBC: 4.2 10*3/uL (ref 4.0–10.5)
nRBC: 0 % (ref 0.0–0.2)

## 2021-05-11 LAB — RESP PANEL BY RT-PCR (FLU A&B, COVID) ARPGX2
Influenza A by PCR: NEGATIVE
Influenza B by PCR: NEGATIVE
SARS Coronavirus 2 by RT PCR: NEGATIVE

## 2021-05-11 LAB — COMPREHENSIVE METABOLIC PANEL
ALT: 35 U/L (ref 0–44)
AST: 29 U/L (ref 15–41)
Albumin: 3.8 g/dL (ref 3.5–5.0)
Alkaline Phosphatase: 96 U/L (ref 38–126)
Anion gap: 8 (ref 5–15)
BUN: 70 mg/dL — ABNORMAL HIGH (ref 6–20)
CO2: 15 mmol/L — ABNORMAL LOW (ref 22–32)
Calcium: 9.7 mg/dL (ref 8.9–10.3)
Chloride: 119 mmol/L — ABNORMAL HIGH (ref 98–111)
Creatinine, Ser: 7.33 mg/dL — ABNORMAL HIGH (ref 0.61–1.24)
GFR, Estimated: 9 mL/min — ABNORMAL LOW (ref >60)
Glucose, Bld: 117 mg/dL — ABNORMAL HIGH (ref 70–99)
Potassium: 6.7 mmol/L (ref 3.5–5.1)
Sodium: 142 mmol/L (ref 135–145)
Total Bilirubin: 0.4 mg/dL (ref 0.3–1.2)
Total Protein: 6.7 g/dL (ref 6.5–8.1)

## 2021-05-11 LAB — LIPASE, BLOOD: Lipase: 57 U/L — ABNORMAL HIGH (ref 11–51)

## 2021-05-11 LAB — CBG MONITORING, ED: Glucose-Capillary: 98 mg/dL (ref 70–99)

## 2021-05-11 MED ORDER — ONDANSETRON 4 MG PO TBDP
4.0000 mg | ORAL_TABLET | Freq: Once | ORAL | Status: AC | PRN
  Administered 2021-05-11: 4 mg via ORAL
  Filled 2021-05-11: qty 1

## 2021-05-11 MED ORDER — ONDANSETRON HCL 4 MG PO TABS: 4.0000 mg | ORAL_TABLET | Freq: Four times a day (QID) | ORAL | Status: DC | PRN

## 2021-05-11 MED ORDER — MELATONIN 5 MG PO TABS: 10.0000 mg | ORAL_TABLET | Freq: Every evening | ORAL | Status: DC | PRN

## 2021-05-11 MED ORDER — TACROLIMUS 1 MG PO CAPS
4.0000 mg | ORAL_CAPSULE | Freq: Two times a day (BID) | ORAL | Status: DC
  Administered 2021-05-12 – 2021-05-13 (×3): 4 mg via ORAL
  Filled 2021-05-11 (×3): qty 4

## 2021-05-11 MED ORDER — SODIUM ZIRCONIUM CYCLOSILICATE 10 G PO PACK
10.0000 g | PACK | ORAL | Status: DC
Start: 2021-05-11 — End: 2021-05-11
  Filled 2021-05-11: qty 1

## 2021-05-11 MED ORDER — DEXTROSE 50 % IV SOLN
50.0000 mL | Freq: Once | INTRAVENOUS | Status: AC
Start: 2021-05-11 — End: 2021-05-11
  Administered 2021-05-11: 50 mL via INTRAVENOUS
  Filled 2021-05-11: qty 50

## 2021-05-11 MED ORDER — ACETAMINOPHEN 325 MG PO TABS
650.0000 mg | ORAL_TABLET | Freq: Four times a day (QID) | ORAL | Status: DC | PRN
  Filled 2021-05-11: qty 2

## 2021-05-11 MED ORDER — TAMSULOSIN HCL 0.4 MG PO CAPS
0.4000 mg | ORAL_CAPSULE | Freq: Every day | ORAL | Status: DC
  Administered 2021-05-12 (×2): 0.4 mg via ORAL
  Filled 2021-05-11 (×2): qty 1

## 2021-05-11 MED ORDER — PREDNISONE 5 MG PO TABS
5.0000 mg | ORAL_TABLET | Freq: Every day | ORAL | Status: DC
  Administered 2021-05-12 – 2021-05-13 (×2): 5 mg via ORAL
  Filled 2021-05-11 (×3): qty 1

## 2021-05-11 MED ORDER — SODIUM ZIRCONIUM CYCLOSILICATE 10 G PO PACK
20.0000 g | PACK | Freq: Once | ORAL | Status: AC
  Administered 2021-05-11: 20 g via ORAL

## 2021-05-11 MED ORDER — SODIUM CHLORIDE 0.9 % IV BOLUS: 1000.0000 mL | Freq: Once | INTRAVENOUS | Status: DC

## 2021-05-11 MED ORDER — ONDANSETRON HCL 4 MG/2ML IJ SOLN: 4.0000 mg | Freq: Four times a day (QID) | INTRAMUSCULAR | Status: DC | PRN

## 2021-05-11 MED ORDER — ASPIRIN 81 MG PO CHEW
81.0000 mg | CHEWABLE_TABLET | Freq: Every day | ORAL | Status: DC
  Administered 2021-05-12 – 2021-05-13 (×2): 81 mg via ORAL
  Filled 2021-05-11 (×2): qty 1

## 2021-05-11 MED ORDER — ACETAMINOPHEN 650 MG RE SUPP: 650.0000 mg | Freq: Four times a day (QID) | RECTAL | Status: DC | PRN

## 2021-05-11 MED ORDER — INSULIN ASPART 100 UNIT/ML IJ SOLN
10.0000 [IU] | Freq: Once | INTRAMUSCULAR | Status: AC
  Administered 2021-05-11: 10 [IU] via INTRAVENOUS

## 2021-05-11 MED ORDER — MYCOPHENOLATE SODIUM 180 MG PO TBEC
360.0000 mg | DELAYED_RELEASE_TABLET | Freq: Two times a day (BID) | ORAL | Status: DC
  Administered 2021-05-12 – 2021-05-13 (×3): 360 mg via ORAL
  Filled 2021-05-11 (×5): qty 2

## 2021-05-11 MED ORDER — SODIUM BICARBONATE 8.4 % IV SOLN
INTRAVENOUS | Status: DC
  Filled 2021-05-11 (×2): qty 1000

## 2021-05-11 MED ORDER — CARVEDILOL 25 MG PO TABS: 25.0000 mg | ORAL_TABLET | Freq: Two times a day (BID) | ORAL | Status: DC

## 2021-05-11 MED ORDER — CALCIUM GLUCONATE-NACL 1-0.675 GM/50ML-% IV SOLN
1.0000 g | Freq: Once | INTRAVENOUS | Status: AC
Start: 2021-05-11 — End: 2021-05-11
  Administered 2021-05-11: 1000 mg via INTRAVENOUS
  Filled 2021-05-11: qty 50

## 2021-05-11 NOTE — Subjective & Objective (Signed)
CC: abd pain, diarrhea HPI: 36 year old African-American male with a history of hypertension, status post kidney transplant August 2022 with a failing renal transplant,, history of paroxysmal A. fib, presents to the ER today with a weeks worth of diarrhea, intermittent nausea and vomiting.  Patient states that last week started [place] diarrhea.  Patient somewhat evasive.  Does not know [place] times days having diarrhea.  He states he is vomiting throughout the day but cannot quantify how many times.  He states he still urinates but does not know how often, how much.  This is despite having undergone dialysis in the past along with a renal transplant.  Apparently patient's behavior has been an issue with him and his transplant physician in the past.  He left AGAINST MEDICAL ADVICE beginning part of January from the ER at [facility].  On arrival to the ER today, temp 98.1, heart rate 62 blood pressure 124/75.  Labs showed a sodium 142, potassium 6.7, bicarbonate 15, BUN of 70, creatinine 7.33  White count 4.2, hemoglobin 8.9, platelets of 167.  COVID-negative, flu negative  EKG demonstrated some hyper peaked T's.  EDP discussed the case with nephrology for the patient be admitted for bicarbonate drip and to treat his hyperkalemia.  [facility] contacted for admission.

## 2021-05-11 NOTE — Assessment & Plan Note (Addendum)
Patient reports no longer takes furosemide or an ACE inhibitor.  On carvedilol 25 mg p.o. twice daily at home.

## 2021-05-11 NOTE — ED Provider Notes (Addendum)
[facility] EMERGENCY DEPARTMENT Provider Note   CSN: [id number] Arrival date & time: 05/11/21  1937     History  Chief Complaint  Patient presents with   Abdominal Pain   Emesis    Cory Dixon is a 36 y.o. male with medical history of renal transplant August 2022, CKD, hypertension, anemia.  Patient presents to ED for evaluation of abdominal pain since last Saturday.  Patient states the pain is located "all over" and that it comes and goes.  Patient is unable to describe the pain for me.  Patient states that the pain is worsened when he eats and drinks and relieved when he lays down.  Patient has not attempted to control symptoms utilizing over-the-counter medication.  Of note, this patient had a renal transplant in August 2022 and is currently being followed by [facility] transplant team.  Patient endorses diarrhea, nausea, vomiting, abdominal pain.  Patient denies fevers, blood in stool, back pain, chest pain, shortness of breath.   Abdominal Pain Associated symptoms: diarrhea, nausea and vomiting   Associated symptoms: no chest pain, no chills, no fever and no shortness of breath   Emesis Associated symptoms: abdominal pain and diarrhea   Associated symptoms: no chills and no fever       Home Medications Prior to Admission medications   Medication Sig Start Date End Date Taking? Authorizing Provider  acetaminophen (TYLENOL) 325 MG tablet Take 650 mg by mouth every 6 (six) hours as needed for moderate pain or headache.   Yes [provider]  aspirin 81 MG chewable tablet Chew 81 mg by mouth daily.   Yes [provider]  carvedilol (COREG) 25 MG tablet Take 25 mg by mouth 2 (two) times daily with a meal.   Yes [provider]  mycophenolate (MYFORTIC) 360 MG TBEC EC tablet Take 360 mg by mouth 2 (two) times daily. 8:30 AM 8:30 PM   Yes [provider]  polyethylene glycol powder (GLYCOLAX/MIRALAX) 17 GM/SCOOP powder Take 17 g by  mouth 2 (two) times daily. Patient taking differently: Take 17 g by mouth daily as needed for mild constipation. 02/02/20  Yes Montine Circle, PA-C  predniSONE (DELTASONE) 5 MG tablet Take 5 mg by mouth daily with breakfast.   Yes [provider]  sodium zirconium cyclosilicate (LOKELMA) 10 g PACK packet Take 10 g by mouth 2 (two) times daily.   Yes [provider]  sulfamethoxazole-trimethoprim (BACTRIM DS) 800-160 MG tablet Take 1 tablet by mouth every Monday, Wednesday, and Friday.   Yes [provider]  tacrolimus (PROGRAF) 1 MG capsule Take 4 mg by mouth 2 (two) times daily.   Yes [provider]  tamsulosin (FLOMAX) 0.4 MG CAPS capsule Take 0.4 mg by mouth at bedtime.   Yes [provider]  docusate sodium (COLACE) 100 MG capsule Take 1 capsule (100 mg total) by mouth every 12 (twelve) hours. Patient not taking: Reported on 05/11/2021 02/02/20   Montine Circle, PA-C  hydrocortisone (ANUCORT-HC) 25 MG suppository Place 1 suppository (25 mg total) rectally 2 (two) times daily as needed for up to 12 doses for hemorrhoids or anal itching. Patient not taking: Reported on 8/85/0277 41/2/87   Defelice, Jeanett Schlein, NP  Lidocaine 0.5 % GEL Apply around the rectum, but NOT in the rectum. Patient not taking: Reported on 04/17/2021 02/02/20   Montine Circle, PA-C  Methoxy PEG-Epoetin Beta (MIRCERA IJ) Inject into the skin. Patient not taking: Reported on 04/17/2021 06/15/19   [provider]  PEG-KCl-NaCl-NaSulf-Na Asc-C (PLENVU) 140 g SOLR Take 1 kit by mouth as directed. Use coupon: BIN: [id number] [facility]: CNRX Group: [id number] ID: [id number] Patient not taking: Reported on 04/17/2021 02/21/20   Thornton Park, MD      Allergies    Norvasc [amlodipine], Nicotine, and Percocet [oxycodone-acetaminophen]    Review of Systems   Review of Systems  Constitutional:  Negative for chills and fever.  Respiratory:  Negative for shortness of breath.    Cardiovascular:  Negative for chest pain.  Gastrointestinal:  Positive for abdominal pain, diarrhea, nausea and vomiting. Negative for blood in stool.  Musculoskeletal:  Negative for back pain.  All other systems reviewed and are negative.  Physical Exam Updated Vital Signs BP 124/75 (BP Location: Right Arm)    Pulse 62    Temp 98.1 F (36.7 C) (Oral)    Resp 20    SpO2 96%  Physical Exam Vitals and nursing note reviewed.  Constitutional:      General: He is not in acute distress.    Appearance: He is well-developed. He is not ill-appearing, toxic-appearing or diaphoretic.  HENT:     Head: Normocephalic and atraumatic.     Nose: Nose normal.     Mouth/Throat:     Mouth: Mucous membranes are moist.  Eyes:     Extraocular Movements: Extraocular movements intact.     Pupils: Pupils are equal, round, and reactive to light.  Cardiovascular:     Rate and Rhythm: Normal rate and regular rhythm.  Pulmonary:     Effort: Pulmonary effort is normal.     Breath sounds: Normal breath sounds. No wheezing.  Abdominal:     General: Abdomen is flat. A surgical scar is present. Bowel sounds are normal. There is no distension.     Palpations: Abdomen is soft.     Tenderness: There is abdominal tenderness in the right lower quadrant and epigastric area.  Musculoskeletal:     Cervical back: Normal range of motion and neck supple. No tenderness.  Skin:    General: Skin is warm and dry.     Capillary Refill: Capillary refill takes less than 2 seconds.  Neurological:     Mental Status: He is alert and oriented to person, place, and time.    ED Results / Procedures / Treatments   Labs (all labs ordered are listed, but only abnormal results are displayed) Labs Reviewed  LIPASE, BLOOD - Abnormal; Notable for the following components:      Result Value   Lipase 57 (*)    All other components within normal limits  COMPREHENSIVE METABOLIC PANEL - Abnormal; Notable for the following components:    Potassium 6.7 (*)    Chloride 119 (*)    CO2 15 (*)    Glucose, Bld 117 (*)    BUN 70 (*)    Creatinine, Ser 7.33 (*)    GFR, Estimated 9 (*)    All other components within normal limits  CBC - Abnormal; Notable for the following components:   RBC 2.86 (*)    Hemoglobin 8.9 (*)    HCT 30.5 (*)    MCV 106.6 (*)    MCHC 29.2 (*)    All other components within normal limits  RESP PANEL BY RT-PCR (FLU A&B, COVID) ARPGX2  GASTROINTESTINAL PANEL BY PCR, STOOL (REPLACES STOOL CULTURE)  URINALYSIS, ROUTINE W REFLEX MICROSCOPIC  CBG MONITORING, ED    EKG EKG Interpretation  Date/Time:  Saturday May 11 2021 22:03:48 EST Ventricular Rate:  65 PR Interval:  115 QRS Duration: 84 QT Interval:  409 QTC Calculation: 426 R Axis:   74 Text Interpretation: Sinus rhythm Borderline short PR interval No significant change since last tracing Confirmed by Calvert Cantor [phone]) on 05/11/2021 10:26:11 PM  Radiology No results found.  Procedures .Critical Care Performed by: Azucena Cecil, PA-C Authorized by: Azucena Cecil, PA-C   Critical care provider statement:    Critical care time (minutes):  45   Critical care time was exclusive of:  Separately billable procedures and treating other patients   Critical care was necessary to treat or prevent imminent or life-threatening deterioration of the following conditions:  Renal failure and metabolic crisis   Critical care was time spent personally by me on the following activities:  Blood draw for specimens, development of treatment plan with patient or surrogate, discussions with consultants, evaluation of patient's response to treatment, examination of patient, interpretation of cardiac output measurements, obtaining history from patient or surrogate, vascular access procedures, review of old charts, re-evaluation of patient's condition, pulse oximetry, ordering and review of radiographic studies, ordering and review of laboratory  studies and ordering and performing treatments and interventions   I assumed direction of critical care for this patient from another provider in my specialty: no     Care discussed with: admitting provider      Medications Ordered in ED Medications  calcium gluconate 1 g/ 50 mL sodium chloride IVPB (1,000 mg Intravenous New Bag/Given 05/11/21 2238)  sodium bicarbonate 150 mEq in dextrose 5 % 1,150 mL infusion (has no administration in time range)  ondansetron (ZOFRAN-ODT) disintegrating tablet 4 mg (4 mg Oral Given 05/11/21 2004)  insulin aspart (novoLOG) injection 10 Units (10 Units Intravenous Given 05/11/21 2235)  dextrose 50 % solution 50 mL (50 mLs Intravenous Given 05/11/21 2230)  sodium zirconium cyclosilicate (LOKELMA) packet 20 g (20 g Oral Given 05/11/21 2250)    ED Course/ Medical Decision Making/ A&P                           Medical Decision Making Amount and/or Complexity of Data Reviewed Labs: ordered.  Risk Prescription drug management. Decision regarding hospitalization.   36 year old male with history of kidney transplant in August 2022 presents to generalized abdominal pain for 1 week.  On examination, patient is afebrile, nontachycardic, nontoxic in appearance.  Patient reports excessive nausea and vomiting over the course of the last week as well as diarrhea.  Of note, this patient was seen here on 04/17/2021 and diagnosed with hyperkalemia.  The patient was set to be admitted for stabilization of potassium levels however he decided to leave [place] because he states that the hospitalist team was attempting to "change my rejection meds".  Labs assessed include: Lipase: Elevated at 57 CBC: Patient results all consistent with baseline CMP: Elevated potassium of 6.7. UA: Pending  EKG: Normal sinus rhythm with peaked T waves consistent with hyperkalemia  Nephrology was consulted, Dr. Joylene Grapes, who advised that this patient should probably be admitted  overnight for potassium stabilization.  Dr. Joylene Grapes recommended 20 mg Lokelma, 10 units insulin, D50, calcium gluconate 1 g, sodium bicarbonate.  These recommendations were placed per nephrology orders.  Hospitalist was paged for admission, Dr. Bridgett Larsson, agreed to admit the patient.  Patient stable at time of admission.    Final Clinical Impression(s) / ED Diagnoses Final diagnoses:  Nausea vomiting and diarrhea  Hyperkalemia    Rx /  DC Orders ED Discharge Orders     None           Truddie Hidden, MD 05/12/21 1500    Lawana Chambers 05/12/21 1527    Truddie Hidden, MD 05/12/21 [phone]

## 2021-05-11 NOTE — Assessment & Plan Note (Addendum)
Follows with [facility] transplant center.  Underwent renal transplant in 11-2020. Appears to have failing transplant. Appears they are trying to get patient another renal transplant.  Continue prednisone 5 mg p.o. daily, Tacrolimus 4 mg p.o. twice daily; Mycophenolate 360 mg p.o. every 12 hours.  On prophylactic antibiotics with Bactrim (MWF)

## 2021-05-11 NOTE — ED Triage Notes (Signed)
Pt here for abd pain w/ N/V/D x1 week. PT reports generalized abd tenderness, and hasn't been able to tolerate anything PO except water. Pt recently had kidney transplant at [facility] in august. Denies sick contacts, fevers/chills.

## 2021-05-11 NOTE — H&P (Signed)
History and Physical    Cory Dixon HYQ:[id number] DOB: [birth year] DOA: 05/11/2021  DOS: the patient was seen and examined on 05/11/2021  PCP: [name], MD   Patient coming from: Home  I have personally briefly reviewed patient's old medical records in [place]  CC: abd pain, diarrhea HPI: 36 year old African-American male with a history of hypertension, status post kidney transplant August 2022 with a failing renal transplant,, history of paroxysmal A. fib, presents to the ER today with a weeks worth of diarrhea, intermittent nausea and vomiting.  Patient states that last week started [place] diarrhea.  Patient somewhat evasive.  Does not know [place] times days having diarrhea.  He states he is vomiting throughout the day but cannot quantify how many times.  He states he still urinates but does not know how often, how much.  This is despite having undergone dialysis in the past along with a renal transplant.  Apparently patient's behavior has been an issue with him and his transplant physician in the past.  He left AGAINST MEDICAL ADVICE beginning part of January from the ER at [facility]  On arrival to the ER today, temp 98.1, heart rate 62 blood pressure 124/75.  Labs showed a sodium 142, potassium 6.7, bicarbonate 15, BUN of 70, creatinine 7.33  White count 4.2, hemoglobin 8.9, platelets of 167.  COVID-negative, flu negative  EKG demonstrated some hyper peaked T's.  EDP discussed the case with nephrology for the patient be admitted for bicarbonate drip and to treat his hyperkalemia.  [facility] hospitalist contacted for admission.   ED Course: labs shows hyperkalemia, EKG with hyperpeaked T  Review of Systems:  Review of Systems  Constitutional:  Positive for malaise/fatigue and weight loss.  HENT: Negative.    Eyes: Negative.   Respiratory: Negative.    Cardiovascular: Negative.   Gastrointestinal:  Positive for abdominal pain, diarrhea, nausea and  vomiting.  Genitourinary:        Right flank where his transplant is located always tender  Skin: Negative.   Neurological: Negative.   Endo/Heme/Allergies: Negative.   Psychiatric/Behavioral: Negative.    All other systems reviewed and are negative.  Past Medical History:  Diagnosis Date   Anemia    Chicken pox    CKD (chronic kidney disease), stage V (HCC)    Headache(784.0)    Hyperkalemia 03/2017   Hypertension    Migraine    Peritoneal dialysis status ([place])     Past Surgical History:  Procedure Laterality Date   DIALYSIS FISTULA CREATION     KIDNEY TRANSPLANT     PERITONEAL CATHETER INSERTION       reports that he has been smoking cigarettes. He has a 1.60 pack-year smoking history. He has never used smokeless tobacco. He reports that he does not drink alcohol and does not use drugs.  Allergies  Allergen Reactions   Norvasc [Amlodipine] Other (See Comments)   Nicotine Other (See Comments)    Experiences nightmare   Percocet [Oxycodone-Acetaminophen] Itching and Other (See Comments)    constipation    Family History  Problem Relation Age of Onset   Arthritis Mother    Arthritis Maternal Grandmother    Hypertension Maternal Grandmother    Depression Maternal Grandmother    Breast cancer Maternal Grandmother    Hypertension Maternal Grandfather    Hypertension Paternal Grandmother    Depression Paternal Grandmother    Hypertension Paternal Grandfather    Diabetes Brother        type 1  diabetes   Liver cancer Maternal Aunt    Prostate cancer Maternal Uncle    Colon cancer Neg Hx    Stomach cancer Neg Hx    Esophageal cancer Neg Hx    Pancreatic cancer Neg Hx     Prior to Admission medications   Medication Sig Start Date End Date Taking? Authorizing Provider  acetaminophen (TYLENOL) 325 MG tablet Take 650 mg by mouth every 6 (six) hours as needed for moderate pain or headache.    [provider]  aspirin 81 MG chewable tablet Chew 81 mg by mouth  daily.    [provider]  carvedilol (COREG) 25 MG tablet Take 25 mg by mouth 2 (two) times daily with a meal.    [provider]  cinacalcet (SENSIPAR) 60 MG tablet Take by mouth. Patient not taking: Reported on 04/17/2021 11/15/18   [provider]  docusate sodium (COLACE) 100 MG capsule Take 1 capsule (100 mg total) by mouth every 12 (twelve) hours. Patient taking differently: Take 100 mg by mouth 2 (two) times daily as needed for mild constipation. 02/02/20   Montine Circle, PA-C  hydrocortisone (ANUCORT-HC) 25 MG suppository Place 1 suppository (25 mg total) rectally 2 (two) times daily as needed for up to 12 doses for hemorrhoids or anal itching. Patient not taking: Reported on 2/97/9892 02/14/40   Defelice, Jeanett Schlein, NP  Lidocaine 0.5 % GEL Apply around the rectum, but NOT in the rectum. Patient not taking: Reported on 04/17/2021 02/02/20   Montine Circle, PA-C  Methoxy PEG-Epoetin Beta (MIRCERA IJ) Inject into the skin. Patient not taking: Reported on 04/17/2021 06/15/19   [provider]  mycophenolate (MYFORTIC) 360 MG TBEC EC tablet Take 360 mg by mouth 2 (two) times daily. 8:30 AM 8:30 PM    [provider]  PEG-KCl-NaCl-NaSulf-Na Asc-C (PLENVU) 140 g SOLR Take 1 kit by mouth as directed. Use coupon: BIN: [id number] [facility]: CNRX Group: [id number] ID: [id number] Patient not taking: Reported on 04/17/2021 02/21/20   Thornton Park, MD  polyethylene glycol powder (GLYCOLAX/MIRALAX) 17 GM/SCOOP powder Take 17 g by mouth 2 (two) times daily. Patient taking differently: Take 17 g by mouth daily as needed for mild constipation. 02/02/20   Montine Circle, PA-C  prednisoLONE 5 MG TABS tablet Take 5 mg by mouth daily.    [provider]  sodium zirconium cyclosilicate (LOKELMA) 10 g PACK packet Take 10 g by mouth 2 (two) times daily.    [provider]  sulfamethoxazole-trimethoprim (BACTRIM DS) 800-160 MG tablet Take 1 tablet by mouth  every Monday, Wednesday, and Friday.    [provider]  tacrolimus (PROGRAF) 1 MG capsule Take 5-6 mg by mouth See admin instructions. 5 mg in the morning 6 mg at bedtime    [provider]  tamsulosin (FLOMAX) 0.4 MG CAPS capsule Take 0.4 mg by mouth at bedtime.    [provider]    Physical Exam: Vitals:   05/11/21 1944  BP: 124/75  Pulse: 62  Resp: 20  Temp: 98.1 F (36.7 C)  TempSrc: Oral  SpO2: 96%    Physical Exam Vitals and nursing note reviewed.  Constitutional:      General: He is not in acute distress.    Appearance: He is normal weight. He is not ill-appearing, toxic-appearing or diaphoretic.  HENT:     Head: Normocephalic and atraumatic.  Eyes:     General: No scleral icterus. Cardiovascular:     Rate and Rhythm: Normal rate and  regular rhythm.     Pulses: Normal pulses.  Pulmonary:     Effort: Pulmonary effort is normal. No respiratory distress.     Breath sounds: No wheezing or rales.  Abdominal:     Comments: RLQ tenderness. Pt refused further abd exam  Musculoskeletal:     Right lower leg: No edema.     Left lower leg: No edema.  Skin:    General: Skin is warm and dry.  Neurological:     General: No focal deficit present.     Mental Status: He is alert and oriented to person, place, and time.  Psychiatric:        Mood and Affect: Affect is blunt, flat and angry.     Labs on Admission: I have personally reviewed following labs and imaging studies  CBC: Recent Labs  Lab 05/11/21 2009  WBC 4.2  HGB 8.9*  HCT 30.5*  MCV 106.6*  PLT 902   Basic Metabolic Panel: Recent Labs  Lab 05/11/21 2009  NA 142  K 6.7*  CL 119*  CO2 15*  GLUCOSE 117*  BUN 70*  CREATININE 7.33*  CALCIUM 9.7   GFR: CrCl cannot be calculated (Unknown ideal weight.). Liver Function Tests: Recent Labs  Lab 05/11/21 2009  AST 29  ALT 35  ALKPHOS 96  BILITOT 0.4  PROT 6.7  ALBUMIN 3.8   Recent Labs  Lab 05/11/21 2009  LIPASE  57*   No results for input(s): AMMONIA in the last 168 hours. Coagulation Profile: No results for input(s): INR, PROTIME in the last 168 hours. Cardiac Enzymes: No results for input(s): CKTOTAL, CKMB, CKMBINDEX, TROPONINI in the last 168 hours. BNP (last 3 results) No results for input(s): PROBNP in the last 8760 hours. HbA1C: No results for input(s): HGBA1C in the last 72 hours. CBG: Recent Labs  Lab 05/11/21 2223  GLUCAP 98   Lipid Profile: No results for input(s): CHOL, HDL, LDLCALC, TRIG, CHOLHDL, LDLDIRECT in the last 72 hours. Thyroid Function Tests: No results for input(s): TSH, T4TOTAL, FREET4, T3FREE, THYROIDAB in the last 72 hours. Anemia Panel: No results for input(s): VITAMINB12, FOLATE, FERRITIN, TIBC, IRON, RETICCTPCT in the last 72 hours. Urine analysis:    Component Value Date/Time   LABSPEC 1.015 09/12/2020 1031   PHURINE 7.5 09/12/2020 1031   GLUCOSEU NEGATIVE 09/12/2020 1031   HGBUR SMALL (A) 09/12/2020 1031   BILIRUBINUR NEGATIVE 09/12/2020 1031   BILIRUBINUR neg 12/30/2010 1151   KETONESUR NEGATIVE 09/12/2020 1031   PROTEINUR 100 (A) 09/12/2020 1031   UROBILINOGEN 0.2 09/12/2020 1031   NITRITE NEGATIVE 09/12/2020 1031   LEUKOCYTESUR NEGATIVE 09/12/2020 1031    Radiological Exams on Admission: I have personally reviewed images No results found.  EKG: I have personally reviewed EKG:  NSR, hyperpeaked T-waves    Assessment/Plan Principal Problem:   Acute hyperkalemia Active Problems:   Diarrhea   Hypertension   ESRD (end stage renal disease) ([place])   Renal transplant recipient    Assessment and Plan: * Acute hyperkalemia- (present on admission) Admit to telemetry bed. Observation. IV bicarb gtts and lokelma per nephrology.  Diarrhea Check GI viral panel.  Renal transplant recipient S/p renal transplant in 11-2020. Appears to have failing transplant. Per Care Everywhere, he follows with [facility] transplant center. Appears they are trying to  get patient another renal transplant. They have not mentioned anything about renal replacement should his current transplanted kidney fails.  ESRD (end stage renal disease) ([place])- (present on admission) Appears that his renal transplant  has failed. May need to restart RRT. Pt was on peritoneal dialysis prior to transplant. He does not want hemodialysis again. Nephrology to address his renal replacement needs.   Hypertension- (present on admission) Continue coreg. Pt states he no longer takes lasix or ACEI.   DVT prophylaxis: SCDs Code Status: Full Code Family Communication: discussed with pt and his mom  Jackqueline at bedside Disposition Plan: return home  Consults called: nephrology  Admission status: Observation, Telemetry bed   Kristopher Oppenheim, DO [facility] 05/11/2021, 11:07 PM

## 2021-05-11 NOTE — Assessment & Plan Note (Addendum)
atient presenting to ED with progressive fatigue, weakness, nausea/vomiting.  Potassium elevated 6.7 on admission with EKG findings of peaked T waves.  Patient was given insulin, calcium gluconate, Lokelma, and IV dextrose in the ED.  Etiology likely secondary to progressive renal failure with likely failing renal transplant.  Patient also endorses poor compliance with sodium bicarbonate tabs outpatient.  Nephrology was consulted and followed during hospital course.  Potassium improved from 6.7-4.8 at time of discharge.  Unfortunately patient left AGAINST MEDICAL ADVICE.

## 2021-05-11 NOTE — Assessment & Plan Note (Addendum)
Patient reports diarrhea, unable to further clarify.  On chronic antibiotics with Bactrim due to his immunosuppression from renal transplant.  Abdominal discomfort and diarrhea now resolved. --GI viral panel: Pending --Enteric precautions

## 2021-05-11 NOTE — Assessment & Plan Note (Addendum)
Patient follows with [facility] medicine transplant team.  Appears that his renal transplant has failed.  [facility] currently following for possible second transplant.  Urinalysis unrevealing.  Renal transplant ultrasound with normal appearance of right lower quadrant transplant without obstruction.  Creatinine improved from 7.33-7.24 at time of discharge with a previous baseline of 6.05 on 04/17/2021.  Patient left AGAINST MEDICAL ADVICE.  Outpatient follow-up with [facility] transplant team.   If patient needs any further hospitalization in the future, likely would recommend transfer to [facility] as he refuses most treatments at this facility given his primary care team is at [facility].

## 2021-05-11 NOTE — ED Notes (Signed)
Provider aware of patients glucose level prior

## 2021-05-12 ENCOUNTER — Observation Stay (HOSPITAL_COMMUNITY): Payer: Medicare Other

## 2021-05-12 DIAGNOSIS — N179 Acute kidney failure, unspecified: Secondary | ICD-10-CM | POA: Insufficient documentation

## 2021-05-12 DIAGNOSIS — E872 Acidosis, unspecified: Secondary | ICD-10-CM | POA: Diagnosis present

## 2021-05-12 DIAGNOSIS — Z8249 Family history of ischemic heart disease and other diseases of the circulatory system: Secondary | ICD-10-CM | POA: Diagnosis not present

## 2021-05-12 DIAGNOSIS — R197 Diarrhea, unspecified: Secondary | ICD-10-CM | POA: Diagnosis present

## 2021-05-12 DIAGNOSIS — D8481 Immunodeficiency due to conditions classified elsewhere: Secondary | ICD-10-CM | POA: Diagnosis present

## 2021-05-12 DIAGNOSIS — Z888 Allergy status to other drugs, medicaments and biological substances status: Secondary | ICD-10-CM | POA: Diagnosis not present

## 2021-05-12 DIAGNOSIS — N281 Cyst of kidney, acquired: Secondary | ICD-10-CM | POA: Diagnosis not present

## 2021-05-12 DIAGNOSIS — Z940 Kidney transplant status: Secondary | ICD-10-CM | POA: Diagnosis not present

## 2021-05-12 DIAGNOSIS — Z7982 Long term (current) use of aspirin: Secondary | ICD-10-CM | POA: Diagnosis not present

## 2021-05-12 DIAGNOSIS — E875 Hyperkalemia: Secondary | ICD-10-CM | POA: Diagnosis present

## 2021-05-12 DIAGNOSIS — I959 Hypotension, unspecified: Secondary | ICD-10-CM | POA: Diagnosis present

## 2021-05-12 DIAGNOSIS — I480 Paroxysmal atrial fibrillation: Secondary | ICD-10-CM | POA: Diagnosis present

## 2021-05-12 DIAGNOSIS — T8619 Other complication of kidney transplant: Secondary | ICD-10-CM | POA: Diagnosis present

## 2021-05-12 DIAGNOSIS — Y830 Surgical operation with transplant of whole organ as the cause of abnormal reaction of the patient, or of later complication, without mention of misadventure at the time of the procedure: Secondary | ICD-10-CM | POA: Diagnosis present

## 2021-05-12 DIAGNOSIS — F17210 Nicotine dependence, cigarettes, uncomplicated: Secondary | ICD-10-CM | POA: Diagnosis present

## 2021-05-12 DIAGNOSIS — Z20822 Contact with and (suspected) exposure to covid-19: Secondary | ICD-10-CM | POA: Diagnosis present

## 2021-05-12 DIAGNOSIS — E860 Dehydration: Secondary | ICD-10-CM | POA: Diagnosis present

## 2021-05-12 DIAGNOSIS — N400 Enlarged prostate without lower urinary tract symptoms: Secondary | ICD-10-CM | POA: Diagnosis present

## 2021-05-12 DIAGNOSIS — Z91048 Other nonmedicinal substance allergy status: Secondary | ICD-10-CM | POA: Diagnosis not present

## 2021-05-12 DIAGNOSIS — Z792 Long term (current) use of antibiotics: Secondary | ICD-10-CM | POA: Diagnosis not present

## 2021-05-12 DIAGNOSIS — Z885 Allergy status to narcotic agent status: Secondary | ICD-10-CM | POA: Diagnosis not present

## 2021-05-12 DIAGNOSIS — Z79899 Other long term (current) drug therapy: Secondary | ICD-10-CM | POA: Diagnosis not present

## 2021-05-12 DIAGNOSIS — N186 End stage renal disease: Secondary | ICD-10-CM | POA: Diagnosis present

## 2021-05-12 DIAGNOSIS — Z79624 Long term (current) use of inhibitors of nucleotide synthesis: Secondary | ICD-10-CM | POA: Diagnosis not present

## 2021-05-12 DIAGNOSIS — I120 Hypertensive chronic kidney disease with stage 5 chronic kidney disease or end stage renal disease: Secondary | ICD-10-CM | POA: Diagnosis present

## 2021-05-12 DIAGNOSIS — D631 Anemia in chronic kidney disease: Secondary | ICD-10-CM | POA: Diagnosis present

## 2021-05-12 DIAGNOSIS — N3289 Other specified disorders of bladder: Secondary | ICD-10-CM | POA: Diagnosis not present

## 2021-05-12 DIAGNOSIS — N189 Chronic kidney disease, unspecified: Secondary | ICD-10-CM | POA: Insufficient documentation

## 2021-05-12 DIAGNOSIS — R001 Bradycardia, unspecified: Secondary | ICD-10-CM | POA: Diagnosis present

## 2021-05-12 LAB — COMPREHENSIVE METABOLIC PANEL
ALT: 29 U/L (ref 0–44)
AST: 23 U/L (ref 15–41)
Albumin: 3.2 g/dL — ABNORMAL LOW (ref 3.5–5.0)
Alkaline Phosphatase: 84 U/L (ref 38–126)
Anion gap: 10 (ref 5–15)
BUN: 70 mg/dL — ABNORMAL HIGH (ref 6–20)
CO2: 14 mmol/L — ABNORMAL LOW (ref 22–32)
Calcium: 9 mg/dL (ref 8.9–10.3)
Chloride: 115 mmol/L — ABNORMAL HIGH (ref 98–111)
Creatinine, Ser: 7.22 mg/dL — ABNORMAL HIGH (ref 0.61–1.24)
GFR, Estimated: 9 mL/min — ABNORMAL LOW (ref >60)
Glucose, Bld: 135 mg/dL — ABNORMAL HIGH (ref 70–99)
Potassium: 5.1 mmol/L (ref 3.5–5.1)
Sodium: 139 mmol/L (ref 135–145)
Total Bilirubin: 0.5 mg/dL (ref 0.3–1.2)
Total Protein: 5.7 g/dL — ABNORMAL LOW (ref 6.5–8.1)

## 2021-05-12 LAB — URINALYSIS, MICROSCOPIC (REFLEX)

## 2021-05-12 LAB — CBC WITH DIFFERENTIAL/PLATELET
Abs Immature Granulocytes: 0.04 10*3/uL (ref 0.00–0.07)
Basophils Absolute: 0 10*3/uL (ref 0.0–0.1)
Basophils Relative: 0 %
Eosinophils Absolute: 0 10*3/uL (ref 0.0–0.5)
Eosinophils Relative: 1 %
HCT: 24.2 % — ABNORMAL LOW (ref 39.0–52.0)
Hemoglobin: 7.6 g/dL — ABNORMAL LOW (ref 13.0–17.0)
Immature Granulocytes: 1 %
Lymphocytes Relative: 13 %
Lymphs Abs: 0.4 10*3/uL — ABNORMAL LOW (ref 0.7–4.0)
MCH: 32.5 pg (ref 26.0–34.0)
MCHC: 31.4 g/dL (ref 30.0–36.0)
MCV: 103.4 fL — ABNORMAL HIGH (ref 80.0–100.0)
Monocytes Absolute: 0.3 10*3/uL (ref 0.1–1.0)
Monocytes Relative: 11 %
Neutro Abs: 2.2 10*3/uL (ref 1.7–7.7)
Neutrophils Relative %: 74 %
Platelets: 153 10*3/uL (ref 150–400)
RBC: 2.34 MIL/uL — ABNORMAL LOW (ref 4.22–5.81)
RDW: 13.8 % (ref 11.5–15.5)
WBC: 3 10*3/uL — ABNORMAL LOW (ref 4.0–10.5)
nRBC: 0 % (ref 0.0–0.2)

## 2021-05-12 LAB — URINALYSIS, ROUTINE W REFLEX MICROSCOPIC
Bilirubin Urine: NEGATIVE
Glucose, UA: NEGATIVE mg/dL
Hgb urine dipstick: NEGATIVE
Ketones, ur: NEGATIVE mg/dL
Leukocytes,Ua: NEGATIVE
Nitrite: NEGATIVE
Protein, ur: 100 mg/dL — AB
Specific Gravity, Urine: 1.025 (ref 1.005–1.030)
pH: 6 (ref 5.0–8.0)

## 2021-05-12 LAB — MAGNESIUM: Magnesium: 1.6 mg/dL — ABNORMAL LOW (ref 1.7–2.4)

## 2021-05-12 MED ORDER — SODIUM BICARBONATE 8.4 % IV SOLN
INTRAVENOUS | Status: DC
  Filled 2021-05-12 (×3): qty 1000

## 2021-05-12 MED ORDER — SODIUM BICARBONATE 650 MG PO TABS
1300.0000 mg | ORAL_TABLET | Freq: Three times a day (TID) | ORAL | Status: DC
  Administered 2021-05-12 – 2021-05-13 (×4): 1300 mg via ORAL
  Filled 2021-05-12 (×4): qty 2

## 2021-05-12 MED ORDER — HEPARIN SODIUM (PORCINE) 5000 UNIT/ML IJ SOLN
5000.0000 [IU] | Freq: Three times a day (TID) | INTRAMUSCULAR | Status: DC
  Administered 2021-05-12 – 2021-05-13 (×3): 5000 [IU] via SUBCUTANEOUS
  Filled 2021-05-12 (×3): qty 1

## 2021-05-12 MED ORDER — HYDROCODONE-ACETAMINOPHEN 5-325 MG PO TABS
1.0000 | ORAL_TABLET | Freq: Four times a day (QID) | ORAL | Status: DC | PRN
  Administered 2021-05-12 – 2021-05-13 (×3): 1 via ORAL
  Filled 2021-05-12 (×3): qty 1

## 2021-05-12 MED ORDER — HYDRALAZINE HCL 25 MG PO TABS: 25.0000 mg | ORAL_TABLET | Freq: Three times a day (TID) | ORAL | Status: DC | PRN

## 2021-05-12 NOTE — Consult Note (Signed)
Nephrology Consult   Requesting provider: Eric [place] Service requesting consult: Hospitalist Reason for consult: Hyperkalemia, AKI on CKD V   Assessment/Recommendations: Celeste Candelas is a/an 36 y.o. male with a past medical history ESRD s/p renal trasnplant in August of 2022, hyperkalemia, BPH, HTN, afib who present w/ nausea vomiting complicated by AKI and hyperkalemia  AKI on CKD V in renal transplant patient: Likely related to dehydration in the setting of nausea, vomiting, diarrhea.  Baseline around 5.  Less likely rejection.  Also possible could have elevated tacrolimus level in the setting of diarrhea -Continue IV hydration with bicarbonate -Transplant medications as below -Diarrhea/nausea vomiting as below -Obtain renal transplant ultrasound -Continue to monitor daily Cr, Dose meds for GFR -Monitor Daily I/Os, Daily weight  -Maintain MAP>65 for optimal renal perfusion.  -Avoid nephrotoxic medications including NSAIDs and Vanc/Zosyn combo -Has functional AV fistula if needed  Renal transplant: In August 2022.  Complicated by poor graft function.  Follows with Azzie Glatter at [facility]. -Continue Myfortic 360 mg twice daily, tacrolimus 4 mg twice daily, prednisone 5 mg daily -Follow-up tacrolimus level; ordered for a.m.  Metabolic acidosis: Likely related to CKD, AKI, diarrhea.  IV sodium bicarbonate as well as oral sodium bicarbonate 1300 mg 3 times daily.  Nausea/vomiting/diarrhea: Unclear cause.  Possible viral.  Would obtain GI pathogen panel.  Would be concerned about norovirus which can be very difficult to treat in the setting of immunosuppression.  If this is the case may need to reach out to infectious disease and possibly transplant at [facility] who how to proceed.  May require decreasing of immunosuppression medications.  Hyperkalemia: Largely driven by acidosis and AKI.  Tacrolimus also contributing.  Improved today.  IV bicarbonate as above.  Continue Lokelma as  needed  Hypertension: Continue current medications  Anemia: Likely multifactorial with CKD contributing.  Hemoglobin 7.6 today.  Transfuse as needed.   Recommendations conveyed to primary service.    [facility] 05/12/2021 9:48 AM   _____________________________________________________________________________________ CC: AKI on CKD V, Hyperkalemia  History of Present Illness: Trice Aspinall is a/an 36 y.o. male with a past medical history of ESRD s/p renal trasnplant in August of 2022, hyperkalemia, BPH, HTN, afib who presents with abdominal pain, weakness, diarrhea, and hyperkalemia.  Patient states about a week ago he ate some food that he thinks disagreed with him.  He had significant amount of nausea and vomiting.  He also had some diarrhea but feels like this was more related to being unable to take his transplant medications with food.  He has had problems with hyperkalemia in the past and has been trying to take his Lokelma.  He has not had a lot of episodes of nausea or vomiting in the past.  No fevers or chills.  Does have some abdominal pain and weakness that he attributes to decreased p.o. intake.  Denies any dysuria or hematuria.  No significant pain over his graft.  In the emergency department he was found to have a creatinine of around 7.  Acidosis was also present and potassium was 6.7.  He was treated with [facility] as well as IV bicarbonate and shifting agents.  Since being admitted his potassium has improved.  He also states that his nausea has significantly improved.  No more episodes of vomiting.  Patient had a renal transplant in August 2022.  Before that he was on peritoneal dialysis.  However, he does have a functioning left upper extremity AV fistula.  He has been  following with Azzie Glatter at [facility].  Renal failure was thought to be related to hypertension but no biopsy was performed before renal failure.  He received fairly good kidney in  August but postoperative course was complicated by delayed graft function as well as urinary retention and pyelonephritis on kidney biopsy.  His graft has not worked significantly well since transplant with baseline creatinine thought to be around 5 but he has remained off dialysis.  He is undergoing protocol for a second kidney transplant.  He has been compliant with his medications outpatient.     Medications:  Current Facility-Administered Medications  Medication Dose Route Frequency Provider Last Rate Last Admin   acetaminophen (TYLENOL) tablet 650 mg  650 mg Oral Q6H PRN Kristopher Oppenheim, DO       Or   acetaminophen (TYLENOL) suppository 650 mg  650 mg Rectal Q6H PRN Kristopher Oppenheim, DO       aspirin chewable tablet 81 mg  81 mg Oral Daily Kristopher Oppenheim, DO   81 mg at 05/12/21 4235   hydrALAZINE (APRESOLINE) tablet 25 mg  25 mg Oral Q8H PRN [place] Eric J, DO       HYDROcodone-acetaminophen (NORCO/VICODIN) 5-325 MG per tablet 1 tablet  1 tablet Oral Q6H PRN Kristopher Oppenheim, DO   1 tablet at 05/12/21 0103   melatonin tablet 10 mg  10 mg Oral QHS PRN Kristopher Oppenheim, DO       mycophenolate (MYFORTIC) EC tablet 360 mg  360 mg Oral Q12H Kristopher Oppenheim, DO   360 mg at 05/12/21 0849   ondansetron (ZOFRAN) tablet 4 mg  4 mg Oral Q6H PRN Kristopher Oppenheim, DO       Or   ondansetron [facility]) injection 4 mg  4 mg Intravenous Q6H PRN Kristopher Oppenheim, DO       predniSONE (DELTASONE) tablet 5 mg  5 mg Oral Q breakfast Kristopher Oppenheim, DO   5 mg at 05/12/21 3614   sodium bicarbonate 150 mEq in dextrose 5 % 1,150 mL infusion   Intravenous Continuous Kristopher Oppenheim, DO   Stopped at 05/12/21 0600   sodium bicarbonate tablet 1,300 mg  1,300 mg Oral TID Reesa Chew, MD   1,300 mg at 05/12/21 0850   tacrolimus (PROGRAF) capsule 4 mg  4 mg Oral BID Kristopher Oppenheim, DO   4 mg at 05/12/21 0850   tamsulosin (FLOMAX) capsule 0.4 mg  0.4 mg Oral QHS Kristopher Oppenheim, DO   0.4 mg at 05/12/21 4315     ALLERGIES Norvasc [amlodipine], Nicotine, and Percocet  [oxycodone-acetaminophen]  MEDICAL HISTORY Past Medical History:  Diagnosis Date   Anemia    Chicken pox    CKD (chronic kidney disease), stage V (HCC)    Headache(784.0)    Hyperkalemia 03/2017   Hypertension    Migraine    Peritoneal dialysis status ([place])      SOCIAL HISTORY Social History   Socioeconomic History   Marital status: Single    Spouse name: Not on file   Number of children: Not on file   Years of education: Not on file   Highest education level: Not on file  Occupational History   Not on file  Tobacco Use   Smoking status: Every Day    Packs/day: 0.20    Years: 8.00    Pack years: 1.60    Types: Cigarettes   Smokeless tobacco: Never  Vaping Use   Vaping Use: Never used  Substance and Sexual Activity   Alcohol use:  No   Drug use: No   Sexual activity: Not on file  Other Topics Concern   Not on file  Social History Narrative   Not on file   Social Determinants of Health   Financial Resource Strain: Not on file  Food Insecurity: Not on file  Transportation Needs: Not on file  Physical Activity: Not on file  Stress: Not on file  Social Connections: Not on file  Intimate Partner Violence: Not on file     FAMILY HISTORY Family History  Problem Relation Age of Onset   Arthritis Mother    Arthritis Maternal Grandmother    Hypertension Maternal Grandmother    Depression Maternal Grandmother    Breast cancer Maternal Grandmother    Hypertension Maternal Grandfather    Hypertension Paternal Grandmother    Depression Paternal Grandmother    Hypertension Paternal Grandfather    Diabetes Brother        type 1 diabetes   Liver cancer Maternal Aunt    Prostate cancer Maternal Uncle    Colon cancer Neg Hx    Stomach cancer Neg Hx    Esophageal cancer Neg Hx    Pancreatic cancer Neg Hx       Review of Systems: 12 systems reviewed Otherwise as per HPI, all other systems reviewed and negative  Physical Exam: Vitals:   05/12/21 0356  05/12/21 0722  BP: (!) 99/56 122/73  Pulse: (!) 57 63  Resp: 15 17  Temp: 97.8 F (36.6 C) 98.1 F (36.7 C)  SpO2: 98% 98%   No intake/output data recorded.  Intake/Output Summary (Last 24 hours) at 05/12/2021 0948 Last data filed at 05/12/2021 0130 Gross per 24 hour  Intake 366.7 ml  Output 100 ml  Net 266.7 ml   General: well-appearing, no acute distress HEENT: anicteric sclera, oropharynx clear without lesions CV: Normal rate, regular rhythm, no peripheral edema Lungs: clear to auscultation bilaterally, normal work of breathing Abd: soft, mild distention, mild tenderness to palpation, no significant tenderness over her graft Skin: no visible lesions or rashes Psych: alert, engaged, appropriate mood and affect Musculoskeletal: no obvious deformities Neuro: normal speech, no gross focal deficits  Access: Left upper extremity AV fistula with good bruit and thrill  Test Results Reviewed Lab Results  Component Value Date   NA 139 05/12/2021   K 5.1 05/12/2021   CL 115 (H) 05/12/2021   CO2 14 (L) 05/12/2021   BUN 70 (H) 05/12/2021   CREATININE 7.22 (H) 05/12/2021   GFR 45.45 (L) 12/30/2010   CALCIUM 9.0 05/12/2021   ALBUMIN 3.2 (L) 05/12/2021   PHOS 4.3 04/17/2021     I have reviewed all relevant outside healthcare records related to the patient's current hospitalization

## 2021-05-12 NOTE — Progress Notes (Signed)
Progress Note   Patient: Cory Dixon WGN:[id number] DOB: 10-08-1985 DOA: 05/11/2021     0 DOS: the patient was seen and examined on 05/12/2021   Brief hospital course: Osiel Stick is a 36 year old male with past medical history significant for ESRD s/p renal transplant 11/2020, essential hypertension, paroxysmal atrial fibrillation who presents to [facility] ED on 2/4 with progressive weakness/fatigue, diarrhea x1 week with intermittent nausea/vomiting.  Patient somewhat evasive during interview.  Cannot quantify how many times per day of diarrhea/vomiting.  Continues to endorse urinary output, but does not know how often/how much.  Apparently, patient's behavior has been an issue with him and his transplant physician in the past.  He has left AGAINST MEDICAL ADVICE January 2023 from the ED at [facility].  In the ED, temperature 98.1 F, HR 62, RR 20, BP 04/30/1973, SPO2 96% on room air.  Sodium 139, potassium 6.7, chloride 119, CO2 15, glucose 117, BUN 70, creatinine 7.33.  Lipase 57, AST 29, ALT 35, total bilirubin 0.4.  WBC 4.2, hemoglobin 8.9, platelets 167.  COVID-19 PCR negative.  Influenza A/B PCR negative.  EKG with normal sinus rhythm, no concerning ST elevation/depressions or T wave inversions, but does note slightly peaked T waves.  EDP discussed with nephrology, patient was given calcium gluconate, IV dextrose, insulin, Lokelma.  TRH consulted for further evaluation and management of hyperkalemia in the setting of acute renal failure with history of ESRD s/p transplant that is likely failing.   Assessment and Plan: * Acute hyperkalemia- (present on admission) Patient presenting to ED with progressive fatigue, weakness, nausea/vomiting.  Potassium elevated 6.7 on admission with EKG findings of peaked T waves.  Patient was given insulin, calcium gluconate, Lokelma, and IV dextrose in the ED.  Etiology likely secondary to progressive renal failure with likely failing renal transplant.  Patient  also endorses poor compliance with sodium bicarbonate tabs outpatient. --Nephrology consulted --K 6.7>5.1 --Continue sodium bicarb and 1300 mg p.o. 3 times daily, bicarb drip --Continue monitor on telemetry --Further per nephrology   AKI on ESRD (end stage renal disease) s/p transplant [facility])- (present on admission) Patient follows with [place] transplant team.  Appears that his renal transplant has failed.  [facility] currently following for possible second transplant.   --Nephrology consulted, may need to restart hemodialysis --Cr 7.33>7.22 (6.05 04/17/21) --Renal transplant ultrasound: Pending --Urinalysis pending --Avoid nephrotoxins, renal dose all medications --Monitor renal function daily  Renal transplant recipient Follows with [facility] transplant center.  Underwent renal transplant in 11-2020. Appears to have failing transplant. Appears they are trying to get patient another renal transplant.  --Prednisone 5 mg p.o. daily --Tacrolimus 4 mg p.o. twice daily (will check level) --Mycophenolate 360 mg p.o. every 12 hours --Hold home Bactrim (MWF) for now given renal dysfunction  Hypertension- (present on admission) Patient reports no longer takes furosemide or an ACE inhibitor.  On carvedilol 25 mg p.o. twice daily at home. --Carvedilol on hold given borderline hypotension/bradycardia --Hydralazine 25 mg p.o. q8h PRN SBP >160 or DBP >110  Diarrhea- (present on admission) Patient reports diarrhea, unable to further clarify.  On chronic antibiotics with Bactrim due to his immunosuppression from renal transplant. --GI viral panel: Pending --Enteric precautions        Subjective:  Patient seen examined at bedside, resting comfortably.  Lying in bed.  Not engaged in conversation and constantly looking at his cell phone.  States saw the kidney doctor this morning, and hopeful to avoid hemodialysis.  Reports has been noncompliant with his sodium  bicarbonate tablets at home.  No further  nausea/vomiting, and weakness and fatigue is slightly improved overnight.  No other specific questions or concerns at this time.  Denies headache, no visual changes, no chest pain, no shortness of breath, no fever/chills/night sweats, no nausea/vomiting/diarrhea, no weakness, no fatigue, no paresthesias.  No acute events overnight per nursing staff.  Physical Exam: Vitals:   05/12/21 0000 05/12/21 0127 05/12/21 0356 05/12/21 0722  BP: 119/63 122/77 (!) 99/56 122/73  Pulse: (!) 57 68 (!) 57 63  Resp: 15 17 15 17   Temp:  97.6 F (36.4 C) 97.8 F (36.6 C) 98.1 F (36.7 C)  TempSrc:  Axillary Axillary Oral  SpO2: 98% 98% 98% 98%  Weight:  90.4 kg    Height:  6\' 1"  (1.854 m)     Physical Exam GEN: 36 yo male in NAD, alert and oriented x 3, wd/wn HEENT: NCAT, PERRL, EOMI, sclera clear, MMM PULM: CTAB w/o wheezes/crackles, normal respiratory effort, on room air CV: RRR w/o M/G/R GI: abd soft, NTND, NABS, no R/G/M MSK: no peripheral edema, muscle strength globally intact 5/5 bilateral upper/lower extremities NEURO: CN II-XII intact, no focal deficits, sensation to light touch intact PSYCH: normal mood/affect Integumentary: dry/intact, no rashes or wounds   Data Reviewed:  CBC, BMP reviewed this morning.  Family Communication: No family present at bedside this morning  Disposition: Status is: Observation The patient remains OBS appropriate and will d/c before 2 midnights.        Planned Discharge Destination: Home     Time spent: 49 minutes spent on chart review, discussion with nursing staff, consultants, updating family and interview/physical exam; more than 50% of that time was spent in counseling and/or coordination of care.  Author: Monti Jilek J [place] DO 05/12/2021 9:25 AM  For on call review [email or web address].

## 2021-05-12 NOTE — Hospital Course (Signed)
Malvin Morrish is a 36 year old male with past medical history significant for ESRD s/p renal transplant 11/2020, essential hypertension, paroxysmal atrial fibrillation who presents to [facility] ED on 2/4 with progressive weakness/fatigue, diarrhea x1 week with intermittent nausea/vomiting.  Patient somewhat evasive during interview.  Cannot quantify how many times per day of diarrhea/vomiting.  Continues to endorse urinary output, but does not know how often/how much.  Apparently, patient's behavior has been an issue with him and his transplant physician in the past.  He has left AGAINST MEDICAL ADVICE January 2023 from the ED at [facility].  In the ED, temperature 98.1 F, HR 62, RR 20, BP 04/30/1973, SPO2 96% on room air.  Sodium 139, potassium 6.7, chloride 119, CO2 15, glucose 117, BUN 70, creatinine 7.33.  Lipase 57, AST 29, ALT 35, total bilirubin 0.4.  WBC 4.2, hemoglobin 8.9, platelets 167.  COVID-19 PCR negative.  Influenza A/B PCR negative.  EKG with normal sinus rhythm, no concerning ST elevation/depressions or T wave inversions, but does note slightly peaked T waves.  EDP discussed with nephrology, patient was given calcium gluconate, IV dextrose, insulin, Lokelma.  TRH consulted for further evaluation and management of hyperkalemia in the setting of acute renal failure with history of ESRD s/p transplant that is likely failing.

## 2021-05-12 NOTE — Care Management Obs Status (Signed)
[place] NOTIFICATION   Patient Details  Name: Cory Dixon MRN: [id number] Date of Birth: 10-05-1985   Medicare Observation Status Notification Given:  Yes    Zenon Mayo, RN 05/12/2021, 9:35 AM

## 2021-05-12 NOTE — Plan of Care (Signed)

## 2021-05-12 NOTE — TOC Progression Note (Signed)
Transition of Care [facility]) - Progression Note    Patient Details  Name: Cory Dixon MRN: [id number] Date of Birth: 1985/06/05  Transition of Care [facility]) CM/SW Contact  Zenon Mayo, RN Phone Number: 05/12/2021, 8:39 AM  Clinical Narrative:     Transition of Care [facility]) Screening Note   Patient Details  Name: Cory Dixon Date of Birth: [birth year]   Transition of Care [facility]) CM/SW Contact:    Zenon Mayo, RN Phone Number: 05/12/2021, 8:39 AM    Transition of Care Department [facility]) has reviewed patient and no TOC needs have been identified at this time. We will continue to monitor patient advancement through interdisciplinary progression rounds. If new patient transition needs arise, please place a TOC consult.          Expected Discharge Plan and Services                                                 Social Determinants of Health (SDOH) Interventions    Readmission Risk Interventions No flowsheet data found.

## 2021-05-13 DIAGNOSIS — D631 Anemia in chronic kidney disease: Secondary | ICD-10-CM | POA: Diagnosis present

## 2021-05-13 DIAGNOSIS — N189 Chronic kidney disease, unspecified: Secondary | ICD-10-CM | POA: Diagnosis present

## 2021-05-13 LAB — GASTROINTESTINAL PANEL BY PCR, STOOL (REPLACES STOOL CULTURE)

## 2021-05-13 LAB — CBC
HCT: 22.8 % — ABNORMAL LOW (ref 39.0–52.0)
Hemoglobin: 7 g/dL — ABNORMAL LOW (ref 13.0–17.0)
MCH: 31.4 pg (ref 26.0–34.0)
MCHC: 30.7 g/dL (ref 30.0–36.0)
MCV: 102.2 fL — ABNORMAL HIGH (ref 80.0–100.0)
Platelets: 136 10*3/uL — ABNORMAL LOW (ref 150–400)
RBC: 2.23 MIL/uL — ABNORMAL LOW (ref 4.22–5.81)
RDW: 13.4 % (ref 11.5–15.5)
WBC: 3.1 10*3/uL — ABNORMAL LOW (ref 4.0–10.5)
nRBC: 0 % (ref 0.0–0.2)

## 2021-05-13 LAB — RENAL FUNCTION PANEL
Albumin: 3.1 g/dL — ABNORMAL LOW (ref 3.5–5.0)
Anion gap: 10 (ref 5–15)
BUN: 65 mg/dL — ABNORMAL HIGH (ref 6–20)
CO2: 21 mmol/L — ABNORMAL LOW (ref 22–32)
Calcium: 8.6 mg/dL — ABNORMAL LOW (ref 8.9–10.3)
Chloride: 109 mmol/L (ref 98–111)
Creatinine, Ser: 7.24 mg/dL — ABNORMAL HIGH (ref 0.61–1.24)
GFR, Estimated: 9 mL/min — ABNORMAL LOW (ref >60)
Glucose, Bld: 100 mg/dL — ABNORMAL HIGH (ref 70–99)
Phosphorus: 3.6 mg/dL (ref 2.5–4.6)
Potassium: 4.8 mmol/L (ref 3.5–5.1)
Sodium: 140 mmol/L (ref 135–145)

## 2021-05-13 LAB — TYPE AND SCREEN
ABO/RH(D): A POS
Antibody Screen: NEGATIVE

## 2021-05-13 LAB — IRON AND TIBC
Iron: 118 ug/dL (ref 45–182)
Saturation Ratios: 73 % — ABNORMAL HIGH (ref 17.9–39.5)
TIBC: 161 ug/dL — ABNORMAL LOW (ref 250–450)
UIBC: 43 ug/dL

## 2021-05-13 LAB — ABO/RH: ABO/RH(D): A POS

## 2021-05-13 LAB — FERRITIN: Ferritin: 1579 ng/mL — ABNORMAL HIGH (ref 24–336)

## 2021-05-13 MED ORDER — SODIUM CHLORIDE 0.9 % IV SOLN: INTRAVENOUS | Status: DC

## 2021-05-13 NOTE — Progress Notes (Signed)
Pt informed me this morning that he did not want lab for type and cross done. He stated x 2 that he does not want to have a blood transfusion here. Night shift nurse Raquel Sarna also reported that pt refused lab for type and screen as well.  Patient told nurse that he does not want this hospital changing his medications regarding his kidney transplant. He states he is aware of his kidney function and will follow up with [facility]. He says "there are too many cooks in the kitchen."  Informed [place] DO and the nephrology doctor as well. Nephrology doctor just left the room and is changing  IV fluid to Normal Saline and stopping the IV Bicarb.

## 2021-05-13 NOTE — Assessment & Plan Note (Addendum)
Hemoglobin 7.0 this morning.  MCV 102.2.  Etiology likely secondary to anemia of chronic renal disease.  Patient refusing type and screen and transfusion.

## 2021-05-13 NOTE — Discharge Summary (Signed)
C[facility]  C[facility] IZT:[id number] DOB: Nov 17, 1985 DOA: 05/11/2021  PCP: Cory Post, MD  Admit date: 05/11/2021 Discharge date: 05/13/2021  Admitted From: Home Disposition: Left AMA  History of present illness:  [facility] is a 36 year old male with past medical history significant for ESRD s/p renal transplant 11/2020, essential hypertension, paroxysmal atrial fibrillation who presents to [facility] on 2/4 with progressive weakness/fatigue, diarrhea x1 week with intermittent nausea/vomiting.  Patient somewhat evasive during interview.  Cannot quantify how many times per day of diarrhea/vomiting.  Continues to endorse urinary output, but does not know how often/how much.  Apparently, patient's behavior has been an issue with him and his transplant physician in the past.  He has left AGAINST MEDICAL ADVICE January 2023 from the [facility].  In the [facility], temperature 98.1 F, HR 62, RR 20, BP 04/30/1973, SPO2 96% on room air.  Sodium 139, potassium 6.7, chloride 119, CO2 15, glucose 117, BUN 70, creatinine 7.33.  Lipase 57, AST 29, ALT 35, total bilirubin 0.4.  WBC 4.2, hemoglobin 8.9, platelets 167.  COVID-19 PCR negative.  Influenza A/B PCR negative.  EKG with normal sinus rhythm, no concerning ST elevation/depressions or T wave inversions, but does note slightly peaked T waves.  EDP discussed with nephrology, patient was given calcium gluconate, IV dextrose, insulin, Lokelma.  TRH consulted for further evaluation and management of hyperkalemia in the setting of acute renal failure with history of ESRD s/p transplant that is likely failing.   [facility] course:  Acute hyperkalemia atient presenting to [facility] with progressive fatigue, weakness, nausea/vomiting.  Potassium elevated 6.7 on admission with EKG findings of peaked T waves.  Patient was given insulin, calcium gluconate, Lokelma, and IV dextrose in the [facility].  Etiology likely secondary to progressive renal  failure with likely failing renal transplant.  Patient also endorses poor compliance with sodium bicarbonate tabs outpatient.  Nephrology was consulted and followed during [facility] course.  Potassium improved from 6.7-4.8 at time of discharge.  Unfortunately patient left AGAINST MEDICAL ADVICE.  Hypertension Patient reports no longer takes furosemide or an ACE inhibitor.  On carvedilol 25 mg p.o. twice daily at home.   AKI on ESRD (end stage renal disease) s/p transplant [facility]) Patient follows with [facility].  Appears that his renal transplant has failed.  [facility] currently following for possible second transplant.  Urinalysis unrevealing.  Renal transplant ultrasound with normal appearance of right lower quadrant transplant without obstruction.  Creatinine improved from 7.33-7.24 at time of discharge with a previous baseline of 6.05 on 04/17/2021.  Patient left AGAINST MEDICAL ADVICE.  Outpatient follow-up with [facility].   If patient needs any further hospitalization in the future, likely would recommend transfer to [facility] as he refuses most treatments at this facility given his primary care [facility] is at [facility].   Renal transplant recipient [facility].  Underwent renal transplant in 11-2020. Appears to have failing transplant. Appears they are trying to get patient another renal transplant.  Continue prednisone 5 mg p.o. daily, Tacrolimus 4 mg p.o. twice daily; Mycophenolate 360 mg p.o. every 12 hours.  On prophylactic antibiotics with Bactrim (MWF)   Diarrhea Patient reports diarrhea, unable to further clarify.  On chronic antibiotics with Bactrim due to his immunosuppression from renal transplant.  Abdominal discomfort and diarrhea now resolved. --GI viral panel: Pending --Enteric precautions  Anemia of chronic kidney failure Hemoglobin 7.0 this morning.  MCV 102.2.  Etiology  likely secondary to anemia of chronic renal  disease.  Patient refusing type and screen and transfusion.  Reasonable efforts were made to advise the patient of the benefit of staying for evaluation as well as treatment. The patient had the decision-making capacity to refuse treatment at this time. We discussed the associated risks of leaving the [facility] prior to completion of workup and treatment; and the patient voiced understanding. We discussed alternatives to current care plan, and the patient still voiced their decision to refuse treatment. Questions were answered and instructions for need of close follow-up was discussed.  Patient also refused to sign the AGAINST MEDICAL ADVICE paperwork prior to departure from the [facility].   Discharge Diagnoses:  Principal Problem:   Acute hyperkalemia Active Problems:    AKI on ESRD (end stage renal disease) s/p transplant [facility])   Renal transplant recipient   Hypertension   Diarrhea   Anemia of chronic kidney failure    Discharge Instructions     Allergies  Allergen Reactions   Norvasc [Amlodipine] Other (See Comments)   Nicotine Other (See Comments)    Experiences nightmare   Percocet [Oxycodone-Acetaminophen] Itching and Other (See Comments)    constipation    Consultations: Nephrology   Procedures/Studies: US Renal Transplant w/Doppler  Result Date: 05/12/2021 CLINICAL DATA:  Acute renal insufficiency. Right lower quadrant renal transplant. EXAM: ULTRASOUND OF RENAL TRANSPLANT WITH RENAL DOPPLER ULTRASOUND TECHNIQUE: Ultrasound examination of the renal transplant was performed with gray-scale, color and duplex doppler evaluation. COMPARISON:  CT abdomen and pelvis-07/06/2019 FINDINGS: Transplant kidney location: RLQ Transplant Kidney: Renal measurements: 9.7 x 4.6 x 4.1 cm = volume: 95.2 mL. Normal in size and parenchymal echogenicity. No evidence of urinary obstruction. Note is made of an approximately 1.4 x 1.1 x 1.6 cm anechoic cyst within the inferior pole the right kidney.  No echogenic renal stones. No peri-transplant fluid collection seen. Color flow in the main renal artery:  Yes Color flow in the main renal vein:  Yes Duplex Doppler Evaluation: Main Renal Artery Velocity: 56.5 cm/sec Main Renal Artery Resistive Index: 0.8 Venous waveform in main renal vein:  Present Intrarenal resistive index in upper pole:  0.7 (normal 0.6-0.8; equivocal 0.8-0.9; abnormal >= 0.9) Intrarenal resistive index in lower pole: 0.7 (normal 0.6-0.8; equivocal 0.8-0.9; abnormal >= 0.9) Bladder: Mild diffuse thickening of the urinary bladder wall, potentially accentuated due to underdistention. Other findings:  None. IMPRESSION: 1. Normal appearance of the right lower quadrant renal transplant without evidence of urinary obstruction and with normal resistive indices. 2. Incidental note made of an approximately 1.6 cm cyst within the inferior pole the right kidney. 3. Mild diffuse thickening the urinary bladder wall, potentially accentuated due to underdistention though conceivably a cystitis could have a similar appearance. Clinical correlation is advised. Electronically Signed   By: Sandi Mariscal M.D.   On: 05/12/2021 13:58   DG Chest Portable 1 View  Result Date: 04/16/2021 CLINICAL DATA:  Hyperkalemia. EXAM: PORTABLE CHEST 1 VIEW COMPARISON:  Chest radiograph dated 03/19/2017. FINDINGS: No focal consolidation, pleural effusion or pneumothorax. Borderline cardiomegaly. No acute osseous pathology. IMPRESSION: No acute cardiopulmonary process. Borderline cardiomegaly. Electronically Signed   By: Anner Crete M.D.   On: 04/16/2021 23:06     Subjective: Patient left AGAINST MEDICAL ADVICE  Discharge Exam: Vitals:   05/13/21 0059 05/13/21 0400  BP: 123/72 119/71  Pulse: 90   Resp: 17   Temp: 98.1 F (36.7 C) 98 F (36.7 C)  SpO2: 98%    Vitals:  05/12/21 1621 05/12/21 2033 05/13/21 0059 05/13/21 0400  BP: 128/69 118/63 123/72 119/71  Pulse: (!) 58 68 90   Resp: 17 17 17    Temp:  98.4 F (36.9 C) 98 F (36.7 C) 98.1 F (36.7 C) 98 F (36.7 C)  TempSrc: Oral Axillary Axillary   SpO2: 97% 98% 98%   Weight:   91.9 kg   Height:          The results of significant diagnostics from this hospitalization (including imaging, microbiology, ancillary and laboratory) are listed below for reference.     Microbiology: Recent Results (from the past 240 hour(s))  Resp Panel by RT-PCR (Flu A&B, Covid) Nasopharyngeal Swab     Status: None   Collection Time: 05/11/21  8:27 PM   Specimen: Nasopharyngeal Swab; Nasopharyngeal(NP) swabs in vial transport medium  Result Value Ref Range Status   SARS Coronavirus 2 by RT PCR NEGATIVE NEGATIVE Final    Comment: (NOTE) SARS-CoV-2 target nucleic acids are NOT DETECTED.  The SARS-CoV-2 RNA is generally detectable in upper respiratory specimens during the acute phase of infection. The lowest concentration of SARS-CoV-2 viral copies this assay can detect is 138 copies/mL. A negative result does not preclude SARS-Cov-2 infection and should not be used as the sole basis for treatment or other patient management decisions. A negative result may occur with  improper specimen collection/handling, submission of specimen other than nasopharyngeal swab, presence of viral mutation(s) within the areas targeted by this assay, and inadequate number of viral copies(<138 copies/mL). A negative result must be combined with clinical observations, patient history, and epidemiological information. The expected result is Negative.  Fact Sheet for Patients:  EntrepreneurPulse.com.au  Fact Sheet for Healthcare Providers:  IncredibleEmployment.be  This test is no t yet approved or cleared by the [place] FDA and  has been authorized for detection and/or diagnosis of SARS-CoV-2 by FDA under an Emergency Use Authorization (EUA). This EUA will remain  in effect (meaning this test can be used) for the duration  of the COVID-19 declaration under Section 564(b)(1) of the Act, 21 U.S.C.section 360bbb-3(b)(1), unless the authorization is terminated  or revoked sooner.       Influenza A by PCR NEGATIVE NEGATIVE Final   Influenza B by PCR NEGATIVE NEGATIVE Final    Comment: (NOTE) The Xpert Xpress SARS-CoV-2/FLU/RSV plus assay is intended as an aid in the diagnosis of influenza from Nasopharyngeal swab specimens and should not be used as a sole basis for treatment. Nasal washings and aspirates are unacceptable for Xpert Xpress SARS-CoV-2/FLU/RSV testing.  Fact Sheet for Patients: EntrepreneurPulse.com.au  Fact Sheet for Healthcare Providers: IncredibleEmployment.be  This test is not yet approved or cleared by the [place] FDA and has been authorized for detection and/or diagnosis of SARS-CoV-2 by FDA under an Emergency Use Authorization (EUA). This EUA will remain in effect (meaning this test can be used) for the duration of the COVID-19 declaration under Section 564(b)(1) of the Act, 21 U.S.C. section 360bbb-3(b)(1), unless the authorization is terminated or revoked.  Performed at [facility], [place] [address] [place], [place] [address]   Gastrointestinal Panel by PCR , Stool     Status: None   Collection Time: 05/12/21 10:16 PM   Specimen: Stool  Result Value Ref Range Status   Campylobacter species NOT DETECTED NOT DETECTED Final   Plesimonas shigelloides NOT DETECTED NOT DETECTED Final   Salmonella species NOT DETECTED NOT DETECTED Final   Yersinia enterocolitica NOT DETECTED NOT DETECTED Final  Vibrio species NOT DETECTED NOT DETECTED Final   Vibrio cholerae NOT DETECTED NOT DETECTED Final   Enteroaggregative E coli (EAEC) NOT DETECTED NOT DETECTED Final   Enteropathogenic E coli (EPEC) NOT DETECTED NOT DETECTED Final   Enterotoxigenic E coli (ETEC) NOT DETECTED NOT DETECTED Final   Shiga like toxin producing E coli (STEC) NOT  DETECTED NOT DETECTED Final   Shigella/Enteroinvasive E coli (EIEC) NOT DETECTED NOT DETECTED Final   Cryptosporidium NOT DETECTED NOT DETECTED Final   Cyclospora cayetanensis NOT DETECTED NOT DETECTED Final   Entamoeba histolytica NOT DETECTED NOT DETECTED Final   Giardia lamblia NOT DETECTED NOT DETECTED Final   Adenovirus F40/41 NOT DETECTED NOT DETECTED Final   Astrovirus NOT DETECTED NOT DETECTED Final   Norovirus GI/GII NOT DETECTED NOT DETECTED Final   Rotavirus A NOT DETECTED NOT DETECTED Final   Sapovirus (I, II, IV, and V) NOT DETECTED NOT DETECTED Final    Comment: Performed at [facility], [address] [place], [place] [address]     Labs: BNP (last 3 results) No results for input(s): BNP in the last 8760 hours. Basic Metabolic Panel: Recent Labs  Lab 05/11/21 2009 05/12/21 0319 05/13/21 0141  NA 142 139 140  K 6.7* 5.1 4.8  CL 119* 115* 109  CO2 15* 14* 21*  GLUCOSE 117* 135* 100*  BUN 70* 70* 65*  CREATININE 7.33* 7.22* 7.24*  CALCIUM 9.7 9.0 8.6*  MG  --  1.6*  --   PHOS  --   --  3.6   Liver Function Tests: Recent Labs  Lab 05/11/21 2009 05/12/21 0319 05/13/21 0141  AST 29 23  --   ALT 35 29  --   ALKPHOS 96 84  --   BILITOT 0.4 0.5  --   PROT 6.7 5.7*  --   ALBUMIN 3.8 3.2* 3.1*   Recent Labs  Lab 05/11/21 2009  LIPASE 57*   No results for input(s): AMMONIA in the last 168 hours. CBC: Recent Labs  Lab 05/11/21 2009 05/12/21 0319 05/13/21 0141  WBC 4.2 3.0* 3.1*  NEUTROABS  --  2.2  --   HGB 8.9* 7.6* 7.0*  HCT 30.5* 24.2* 22.8*  MCV 106.6* 103.4* 102.2*  PLT 167 153 136*   Cardiac Enzymes: No results for input(s): CKTOTAL, CKMB, CKMBINDEX, TROPONINI in the last 168 hours. BNP: Invalid input(s): POCBNP CBG: Recent Labs  Lab 05/11/21 2223  GLUCAP 98   D-Dimer No results for input(s): DDIMER in the last 72 hours. Hgb A1c No results for input(s): HGBA1C in the last 72 hours. Lipid Profile No results for  input(s): CHOL, HDL, LDLCALC, TRIG, CHOLHDL, LDLDIRECT in the last 72 hours. Thyroid function studies No results for input(s): TSH, T4TOTAL, T3FREE, THYROIDAB in the last 72 hours.  Invalid input(s): FREET3 Anemia work up Recent Labs    05/13/21 1103  FERRITIN 1,579*  TIBC 161*  IRON 118   Urinalysis    Component Value Date/Time   COLORURINE YELLOW 05/12/2021 [place] 05/12/2021 1342   LABSPEC 1.025 05/12/2021 1342   PHURINE 6.0 05/12/2021 1342   GLUCOSEU NEGATIVE 05/12/2021 1342   HGBUR NEGATIVE 05/12/2021 1342   [place] 05/12/2021 1342   BILIRUBINUR neg 12/30/2010 1151   KETONESUR NEGATIVE 05/12/2021 1342   PROTEINUR 100 (A) 05/12/2021 1342   UROBILINOGEN 0.2 09/12/2020 1031   NITRITE NEGATIVE 05/12/2021 1342   LEUKOCYTESUR NEGATIVE 05/12/2021 1342   Sepsis Labs Invalid input(s): PROCALCITONIN,  WBC,  LACTICIDVEN Microbiology  Recent Results (from the past 240 hour(s))  Resp Panel by RT-PCR (Flu A&B, Covid) Nasopharyngeal Swab     Status: None   Collection Time: 05/11/21  8:27 PM   Specimen: Nasopharyngeal Swab; Nasopharyngeal(NP) swabs in vial transport medium  Result Value Ref Range Status   SARS Coronavirus 2 by RT PCR NEGATIVE NEGATIVE Final    Comment: (NOTE) SARS-CoV-2 target nucleic acids are NOT DETECTED.  The SARS-CoV-2 RNA is generally detectable in upper respiratory specimens during the acute phase of infection. The lowest concentration of SARS-CoV-2 viral copies this assay can detect is 138 copies/mL. A negative result does not preclude SARS-Cov-2 infection and should not be used as the sole basis for treatment or other patient management decisions. A negative result may occur with  improper specimen collection/handling, submission of specimen other than nasopharyngeal swab, presence of viral mutation(s) within the areas targeted by this assay, and inadequate number of viral copies(<138 copies/mL). A negative result must be  combined with clinical observations, patient history, and epidemiological information. The expected result is Negative.  Fact Sheet for Patients:  EntrepreneurPulse.com.au  Fact Sheet for Healthcare Providers:  IncredibleEmployment.be  This test is no t yet approved or cleared by the [place] FDA and  has been authorized for detection and/or diagnosis of SARS-CoV-2 by FDA under an Emergency Use Authorization (EUA). This EUA will remain  in effect (meaning this test can be used) for the duration of the COVID-19 declaration under Section 564(b)(1) of the Act, 21 U.S.C.section 360bbb-3(b)(1), unless the authorization is terminated  or revoked sooner.       Influenza A by PCR NEGATIVE NEGATIVE Final   Influenza B by PCR NEGATIVE NEGATIVE Final    Comment: (NOTE) The Xpert Xpress SARS-CoV-2/FLU/RSV plus assay is intended as an aid in the diagnosis of influenza from Nasopharyngeal swab specimens and should not be used as a sole basis for treatment. Nasal washings and aspirates are unacceptable for Xpert Xpress SARS-CoV-2/FLU/RSV testing.  Fact Sheet for Patients: EntrepreneurPulse.com.au  Fact Sheet for Healthcare Providers: IncredibleEmployment.be  This test is not yet approved or cleared by the [place] FDA and has been authorized for detection and/or diagnosis of SARS-CoV-2 by FDA under an Emergency Use Authorization (EUA). This EUA will remain in effect (meaning this test can be used) for the duration of the COVID-19 declaration under Section 564(b)(1) of the Act, 21 U.S.C. section 360bbb-3(b)(1), unless the authorization is terminated or revoked.  Performed at [facility], [place] [address], [place], [place] [address]   Gastrointestinal Panel by PCR , Stool     Status: None   Collection Time: 05/12/21 10:16 PM   Specimen: Stool  Result Value Ref Range Status   Campylobacter species NOT  DETECTED NOT DETECTED Final   Plesimonas shigelloides NOT DETECTED NOT DETECTED Final   Salmonella species NOT DETECTED NOT DETECTED Final   Yersinia enterocolitica NOT DETECTED NOT DETECTED Final   Vibrio species NOT DETECTED NOT DETECTED Final   Vibrio cholerae NOT DETECTED NOT DETECTED Final   Enteroaggregative E coli (EAEC) NOT DETECTED NOT DETECTED Final   Enteropathogenic E coli (EPEC) NOT DETECTED NOT DETECTED Final   Enterotoxigenic E coli (ETEC) NOT DETECTED NOT DETECTED Final   Shiga like toxin producing E coli (STEC) NOT DETECTED NOT DETECTED Final   Shigella/Enteroinvasive E coli (EIEC) NOT DETECTED NOT DETECTED Final   Cryptosporidium NOT DETECTED NOT DETECTED Final   Cyclospora cayetanensis NOT DETECTED NOT DETECTED Final   Entamoeba histolytica NOT DETECTED NOT  DETECTED Final   Giardia lamblia NOT DETECTED NOT DETECTED Final   Adenovirus F40/41 NOT DETECTED NOT DETECTED Final   Astrovirus NOT DETECTED NOT DETECTED Final   Norovirus GI/GII NOT DETECTED NOT DETECTED Final   Rotavirus A NOT DETECTED NOT DETECTED Final   Sapovirus (I, II, IV, and V) NOT DETECTED NOT DETECTED Final    Comment: Performed at [facility], [address], [place], [place] [address]     Time coordinating discharge: Over 30 minutes  SIGNED:   Kamica Florance J [place] DO  [facility] 05/13/2021, 2:50 PM

## 2021-05-13 NOTE — Progress Notes (Signed)
[facility] Progress Note  Name: Dorwin Fitzhenry MRN: [id number] DOB: [birth year]  Chief Complaint:  Abdominal pain and diarrhea   Subjective:  Strict ins/outs not available.  He had 400 mL uop as well as 1 unmeasured void.  He feels a little better.  States that he doesn't feel comfortable with epogen/aranesp - he doesn't want "too many cooks in the kitchen" about his kidneys.  He has received his care at [facility] and went here because it's closer.  He is comfortable with me changing his fluids.  We discussed that aranesp would make it less likely that he would need a unit of blood.  Just ambulated from restroom  Review of systems:  He denies n/v Denies diarrhea  Denies shortness of breath or chest pain   ---------------------- Background on consult:  Cobey Raineri is a/an 36 y.o. male with a past medical history of ESRD s/p renal trasnplant in August of 2022, hyperkalemia, BPH, HTN, afib who presents with abdominal pain, weakness, diarrhea, and hyperkalemia.  Patient states about a week ago he ate some food that he thinks disagreed with him.  He had significant amount of nausea and vomiting.  He also had some diarrhea but feels like this was more related to being unable to take his transplant medications with food.  He has had problems with hyperkalemia in the past and has been trying to take his Lokelma.  He has not had a lot of episodes of nausea or vomiting in the past.  No fevers or chills.  Does have some abdominal pain and weakness that he attributes to decreased p.o. intake.  Denies any dysuria or hematuria.  No significant pain over his graft.  In the emergency department he was found to have a creatinine of around 7.  Acidosis was also present and potassium was 6.7.  He was treated with [facility] as well as IV bicarbonate and shifting agents.  Since being admitted his potassium has improved.  He also states that his nausea has significantly improved.  No more episodes of  vomiting.    Patient had a renal transplant in August 2022.  Before that he was on peritoneal dialysis.  However, he does have a functioning left upper extremity AV fistula.  He has been following with Azzie Glatter at [facility].  Renal failure was thought to be related to hypertension but no biopsy was performed before renal failure.  He received fairly good kidney in August but postoperative course was complicated by delayed graft function as well as urinary retention and pyelonephritis on kidney biopsy.  His graft has not worked significantly well since transplant with baseline creatinine thought to be around 5 but he has remained off dialysis.  He is undergoing protocol for a second kidney transplant.  He has been compliant with his medications outpatient.   Intake/Output Summary (Last 24 hours) at 05/13/2021 0906 Last data filed at 05/13/2021 0806 Gross per 24 hour  Intake 2263.33 ml  Output 400 ml  Net 1863.33 ml    Vitals:  Vitals:   05/12/21 1621 05/12/21 2033 05/13/21 0059 05/13/21 0400  BP: 128/69 118/63 123/72 119/71  Pulse: (!) 58 68 90   Resp: 17 17 17    Temp: 98.4 F (36.9 C) 98 F (36.7 C) 98.1 F (36.7 C) 98 F (36.7 C)  TempSrc: Oral Axillary Axillary   SpO2: 97% 98% 98%   Weight:   91.9 kg   Height:         Physical Exam:  General  adult male in bed in no acute distress HEENT normocephalic atraumatic extraocular movements intact sclera anicteric Neck supple trachea midline Lungs clear to auscultation bilaterally normal work of breathing at rest  Heart S1S2 no rub Abdomen soft nontender nondistended Extremities no edema  Psych no anxiety or agitation  Neuro awake and conversant LUE AVF bruit and thrill    Medications reviewed   Labs:  BMP Latest Ref Rng & Units 05/13/2021 05/12/2021 05/11/2021  Glucose 70 - 99 mg/dL 100(H) 135(H) 117(H)  BUN 6 - 20 mg/dL 65(H) 70(H) 70(H)  Creatinine 0.61 - 1.24 mg/dL 7.24(H) 7.22(H) 7.33(H)  Sodium 135 - 145 mmol/L 140 139 142   Potassium 3.5 - 5.1 mmol/L 4.8 5.1 6.7(HH)  Chloride 98 - 111 mmol/L 109 115(H) 119(H)  CO2 22 - 32 mmol/L 21(L) 14(L) 15(L)  Calcium 8.9 - 10.3 mg/dL 8.6(L) 9.0 9.7     Assessment/Plan:   Jamaal Bedonie is a/an 36 y.o. male with a past medical history ESRD s/p renal trasnplant in August of 2022, hyperkalemia, BPH, HTN, afib who present w/ nausea vomiting complicated by AKI and hyperkalemia   # AKI on CKD V in renal transplant patient: Likely related to dehydration in the setting of nausea, vomiting, diarrhea.  Baseline around 5.  Less likely rejection.  Also possible could have elevated tacrolimus level in the setting of diarrhea. Transplant [place] without evidence of obstruction -Continue IV hydration - transition to normal saline  -Transplant medications as below -supportive care per primary team  -Note Has functional AV fistula if needed - no acute indication for dialysis    Renal transplant: In August 2022.  Complicated by poor graft function.  Follows with Azzie Glatter at [facility]. -Continue Myfortic 360 mg twice daily, tacrolimus 4 mg twice daily, prednisone 5 mg daily -Follow-up tacrolimus level; ordered for this a.m and was obtained at 1:41 am on 2/6 after a 2100 dose on 2/5.  Not really a trough    Metabolic acidosis: Likely related to CKD, AKI, diarrhea.  s/p IV bicarb - transition to PO.   Nausea/vomiting/diarrhea: Unclear cause.  Possible viral.  Would obtain GI pathogen panel.  Would be concerned about norovirus which can be very difficult to treat in the setting of immunosuppression.  If this is the case may need to reach out to infectious disease and possibly transplant at [facility] who how to proceed.  May require decreasing of immunosuppression medications.   Hyperkalemia: Largely driven by acidosis and AKI.  Tacrolimus also contributing.  Improved.  on po bicarbonate as above   Hypertension: Controlled and not on medications; hypotension earlier this admit; avoid hypotension    Anemia: Likely multifactorial with CKD contributing.  no emergent indication for transfusion but very near need. Add iron panel.  He has refused ESA today.    If he does not feel comfortable with care here would consider transition to his transplant center, Elonda Husky, MD 05/13/2021 9:38 AM

## 2021-05-13 NOTE — Progress Notes (Signed)
Progress Note   Patient: Cory Dixon OZD:[id number] DOB: March 13, 1986 DOA: 05/11/2021     1 DOS: the patient was seen and examined on 05/13/2021   Brief hospital course: Cory Dixon is a 36 year old male with past medical history significant for ESRD s/p renal transplant 11/2020, essential hypertension, paroxysmal atrial fibrillation who presents to [facility] ED on 2/4 with progressive weakness/fatigue, diarrhea x1 week with intermittent nausea/vomiting.  Patient somewhat evasive during interview.  Cannot quantify how many times per day of diarrhea/vomiting.  Continues to endorse urinary output, but does not know how often/how much.  Apparently, patient's behavior has been an issue with him and his transplant physician in the past.  He has left AGAINST MEDICAL ADVICE January 2023 from the ED at [facility].  In the ED, temperature 98.1 F, HR 62, RR 20, BP 04/30/1973, SPO2 96% on room air.  Sodium 139, potassium 6.7, chloride 119, CO2 15, glucose 117, BUN 70, creatinine 7.33.  Lipase 57, AST 29, ALT 35, total bilirubin 0.4.  WBC 4.2, hemoglobin 8.9, platelets 167.  COVID-19 PCR negative.  Influenza A/B PCR negative.  EKG with normal sinus rhythm, no concerning ST elevation/depressions or T wave inversions, but does note slightly peaked T waves.  EDP discussed with nephrology, patient was given calcium gluconate, IV dextrose, insulin, Lokelma.  TRH consulted for further evaluation and management of hyperkalemia in the setting of acute renal failure with history of ESRD s/p transplant that is likely failing.   Assessment and Plan: * Acute hyperkalemia- (present on admission) Patient presenting to ED with progressive fatigue, weakness, nausea/vomiting.  Potassium elevated 6.7 on admission with EKG findings of peaked T waves.  Patient was given insulin, calcium gluconate, Lokelma, and IV dextrose in the ED.  Etiology likely secondary to progressive renal failure with likely failing renal transplant.  Patient  also endorses poor compliance with sodium bicarbonate tabs outpatient. --Nephrology consulted --K 6.7>5.1>4.8 --Continue sodium bicarb and 1300 mg p.o. 3 times daily, bicarb drip --Continue monitor on telemetry --Further per nephrology   AKI on ESRD (end stage renal disease) s/p transplant [facility])- (present on admission) Patient follows with [place] transplant team.  Appears that his renal transplant has failed.  [facility] currently following for possible second transplant.  Urinalysis unrevealing.  Renal transplant ultrasound with normal appearance of right lower quadrant transplant without obstruction. --Nephrology consulted, may need to restart hemodialysis --Cr 7.33>7.22>7.24 (6.05 04/17/21) --Avoid nephrotoxins, renal dose all medications --Monitor renal function daily  Renal transplant recipient Follows with [facility] transplant center.  Underwent renal transplant in 11-2020. Appears to have failing transplant. Appears they are trying to get patient another renal transplant.  --Prednisone 5 mg p.o. daily --Tacrolimus 4 mg p.o. twice daily; level pending --Mycophenolate 360 mg p.o. every 12 hours --Hold home Bactrim (MWF) for now given renal dysfunction  Hypertension- (present on admission) Patient reports no longer takes furosemide or an ACE inhibitor.  On carvedilol 25 mg p.o. twice daily at home. --Carvedilol on hold given borderline hypotension/bradycardia --Hydralazine 25 mg p.o. q8h PRN SBP >160 or DBP >110  Diarrhea- (present on admission) Patient reports diarrhea, unable to further clarify.  On chronic antibiotics with Bactrim due to his immunosuppression from renal transplant.  Abdominal discomfort and diarrhea now resolved. --GI viral panel: Pending --Enteric precautions  Anemia of chronic kidney failure- (present on admission) Hemoglobin 7.0 this morning.  MCV 102.2.  Etiology likely secondary to anemia of chronic renal disease.  Patient refusing type and screen and  transfusion. --will add on  anemia panel to previous lab draw.        Subjective:  Patient seen and examined at bedside, resting comfortably.  States abdominal discomfort, nausea and diarrhea have now resolved.  He reports good urine output.  Also states "I do not want anyone changing my transplant medications, this will be done by his [place] doctors".  Discussed with patient that his potassium, serum bicarbonate level improved.  Although his creatinine is stable at 7.24 this morning, it is higher than his previous levels, and patient reports "I understand this and this is likely from not taking bicarbonate tablets and not drinking enough water".  No other questions or concerns at this time.  Denies headache, no dizziness, no fever/chills/night sweats, no nausea/vomit/diarrhea, no chest pain, no palpitations, no shortness of breath, no abdominal pain, no weakness, no fatigue, no paresthesias.  No acute events overnight per nurse staff.  Physical Exam: Vitals:   05/12/21 1621 05/12/21 2033 05/13/21 0059 05/13/21 0400  BP: 128/69 118/63 123/72 119/71  Pulse: (!) 58 68 90   Resp: 17 17 17    Temp: 98.4 F (36.9 C) 98 F (36.7 C) 98.1 F (36.7 C) 98 F (36.7 C)  TempSrc: Oral Axillary Axillary   SpO2: 97% 98% 98%   Weight:   91.9 kg   Height:       Physical Exam GEN: 36 yo male in NAD, alert and oriented x 3, wd/wn HEENT: NCAT, PERRL, EOMI, sclera clear, MMM PULM: CTAB w/o wheezes/crackles, normal respiratory effort, on room air CV: RRR w/o M/G/R GI: abd soft, NTND, NABS, no R/G/M MSK: no peripheral edema, muscle strength globally intact 5/5 bilateral upper/lower extremities NEURO: CN II-XII intact, no focal deficits, sensation to light touch intact PSYCH: normal mood/affect Integumentary: dry/intact, no rashes or wounds   Data Reviewed:  Reviewed BMP, renal transplant ultrasound personally this morning  Family Communication: No family present at bedside this  morning  Disposition: Status is: Inpatient Remains inpatient appropriate because: Pending nephrology sign off          Planned Discharge Destination: Home    Time spent: 39 minutes spent on chart review, discussion with nursing staff, consultants, updating family and interview/physical exam; more than 50% of that time was spent in counseling and/or coordination of care.  Author: Donnamarie Poag [place] DO 05/13/2021 9:38 AM  For on call review [email or web address].

## 2021-05-13 NOTE — Progress Notes (Signed)
Pt wanted to speak with nephrology doctor again and she was aware. Pt could not wait for MD and became impulsive.   He got dressed and had someone waiting for him downstairs. He did come back and allow me the nurse to remove his IV. He left the unit talking on his cell phone to someone waiting downstairs for him.  He ambulated off the unit unassisted and alone. He refused to sign the AMA form even after two attempts to get him to sign.

## 2021-05-14 LAB — TACROLIMUS LEVEL: Tacrolimus (FK506) - LabCorp: 8.4 ng/mL (ref 2.0–20.0)

## 2021-05-15 DIAGNOSIS — D849 Immunodeficiency, unspecified: Secondary | ICD-10-CM | POA: Diagnosis not present

## 2021-05-15 DIAGNOSIS — Z940 Kidney transplant status: Secondary | ICD-10-CM | POA: Diagnosis not present

## 2021-05-15 DIAGNOSIS — N184 Chronic kidney disease, stage 4 (severe): Secondary | ICD-10-CM | POA: Diagnosis not present

## 2021-05-15 DIAGNOSIS — D631 Anemia in chronic kidney disease: Secondary | ICD-10-CM | POA: Diagnosis not present

## 2021-05-15 DIAGNOSIS — Z298 Encounter for other specified prophylactic measures: Secondary | ICD-10-CM | POA: Diagnosis not present

## 2021-05-15 LAB — TACROLIMUS LEVEL: Tacrolimus (FK506) - LabCorp: 9.6 ng/mL (ref 2.0–20.0)

## 2021-05-30 DIAGNOSIS — Z5181 Encounter for therapeutic drug level monitoring: Secondary | ICD-10-CM | POA: Diagnosis not present

## 2021-05-30 DIAGNOSIS — D84821 Immunodeficiency due to drugs: Secondary | ICD-10-CM | POA: Diagnosis not present

## 2021-05-30 DIAGNOSIS — D631 Anemia in chronic kidney disease: Secondary | ICD-10-CM | POA: Diagnosis not present

## 2021-05-30 DIAGNOSIS — Z992 Dependence on renal dialysis: Secondary | ICD-10-CM | POA: Diagnosis not present

## 2021-05-30 DIAGNOSIS — Z87891 Personal history of nicotine dependence: Secondary | ICD-10-CM | POA: Diagnosis not present

## 2021-05-30 DIAGNOSIS — E875 Hyperkalemia: Secondary | ICD-10-CM | POA: Diagnosis not present

## 2021-05-30 DIAGNOSIS — Z862 Personal history of diseases of the blood and blood-forming organs and certain disorders involving the immune mechanism: Secondary | ICD-10-CM | POA: Diagnosis not present

## 2021-05-30 DIAGNOSIS — N189 Chronic kidney disease, unspecified: Secondary | ICD-10-CM | POA: Diagnosis not present

## 2021-05-30 DIAGNOSIS — D849 Immunodeficiency, unspecified: Secondary | ICD-10-CM | POA: Diagnosis not present

## 2021-05-30 DIAGNOSIS — I10 Essential (primary) hypertension: Secondary | ICD-10-CM | POA: Diagnosis not present

## 2021-05-30 DIAGNOSIS — I4891 Unspecified atrial fibrillation: Secondary | ICD-10-CM | POA: Diagnosis not present

## 2021-05-30 DIAGNOSIS — Z4822 Encounter for aftercare following kidney transplant: Secondary | ICD-10-CM | POA: Diagnosis not present

## 2021-05-30 DIAGNOSIS — Z7682 Awaiting organ transplant status: Secondary | ICD-10-CM | POA: Diagnosis not present

## 2021-05-30 DIAGNOSIS — Z79899 Other long term (current) drug therapy: Secondary | ICD-10-CM | POA: Diagnosis not present

## 2021-05-30 DIAGNOSIS — N186 End stage renal disease: Secondary | ICD-10-CM | POA: Diagnosis not present

## 2021-05-30 DIAGNOSIS — Z940 Kidney transplant status: Secondary | ICD-10-CM | POA: Diagnosis not present

## 2021-05-30 DIAGNOSIS — I129 Hypertensive chronic kidney disease with stage 1 through stage 4 chronic kidney disease, or unspecified chronic kidney disease: Secondary | ICD-10-CM | POA: Diagnosis not present

## 2021-06-10 DIAGNOSIS — Z79899 Other long term (current) drug therapy: Secondary | ICD-10-CM | POA: Diagnosis not present

## 2021-06-10 DIAGNOSIS — Z940 Kidney transplant status: Secondary | ICD-10-CM | POA: Diagnosis not present

## 2021-06-10 DIAGNOSIS — D849 Immunodeficiency, unspecified: Secondary | ICD-10-CM | POA: Diagnosis not present

## 2021-06-10 DIAGNOSIS — D649 Anemia, unspecified: Secondary | ICD-10-CM | POA: Diagnosis not present

## 2021-06-10 DIAGNOSIS — T8619 Other complication of kidney transplant: Secondary | ICD-10-CM | POA: Diagnosis not present

## 2021-06-10 DIAGNOSIS — B258 Other cytomegaloviral diseases: Secondary | ICD-10-CM | POA: Diagnosis not present

## 2021-06-10 DIAGNOSIS — Z87891 Personal history of nicotine dependence: Secondary | ICD-10-CM | POA: Diagnosis not present

## 2021-06-20 DIAGNOSIS — Z992 Dependence on renal dialysis: Secondary | ICD-10-CM | POA: Diagnosis not present

## 2021-06-20 DIAGNOSIS — D849 Immunodeficiency, unspecified: Secondary | ICD-10-CM | POA: Diagnosis not present

## 2021-06-20 DIAGNOSIS — D631 Anemia in chronic kidney disease: Secondary | ICD-10-CM | POA: Diagnosis not present

## 2021-06-20 DIAGNOSIS — D649 Anemia, unspecified: Secondary | ICD-10-CM | POA: Diagnosis not present

## 2021-06-20 DIAGNOSIS — I480 Paroxysmal atrial fibrillation: Secondary | ICD-10-CM | POA: Diagnosis not present

## 2021-06-20 DIAGNOSIS — Z940 Kidney transplant status: Secondary | ICD-10-CM | POA: Diagnosis not present

## 2021-06-20 DIAGNOSIS — N185 Chronic kidney disease, stage 5: Secondary | ICD-10-CM | POA: Diagnosis not present

## 2021-06-20 DIAGNOSIS — Z5181 Encounter for therapeutic drug level monitoring: Secondary | ICD-10-CM | POA: Diagnosis not present

## 2021-06-20 DIAGNOSIS — T8619 Other complication of kidney transplant: Secondary | ICD-10-CM | POA: Diagnosis not present

## 2021-06-20 DIAGNOSIS — D84821 Immunodeficiency due to drugs: Secondary | ICD-10-CM | POA: Diagnosis not present

## 2021-06-20 DIAGNOSIS — N186 End stage renal disease: Secondary | ICD-10-CM | POA: Diagnosis not present

## 2021-06-20 DIAGNOSIS — B259 Cytomegaloviral disease, unspecified: Secondary | ICD-10-CM | POA: Diagnosis not present

## 2021-06-20 DIAGNOSIS — N189 Chronic kidney disease, unspecified: Secondary | ICD-10-CM | POA: Diagnosis not present

## 2021-06-20 DIAGNOSIS — Z1159 Encounter for screening for other viral diseases: Secondary | ICD-10-CM | POA: Diagnosis not present

## 2021-06-20 DIAGNOSIS — E875 Hyperkalemia: Secondary | ICD-10-CM | POA: Diagnosis not present

## 2021-06-20 DIAGNOSIS — I10 Essential (primary) hypertension: Secondary | ICD-10-CM | POA: Diagnosis not present

## 2021-07-01 DIAGNOSIS — Z992 Dependence on renal dialysis: Secondary | ICD-10-CM | POA: Diagnosis not present

## 2021-07-01 DIAGNOSIS — N186 End stage renal disease: Secondary | ICD-10-CM | POA: Diagnosis not present

## 2021-07-03 DIAGNOSIS — N186 End stage renal disease: Secondary | ICD-10-CM | POA: Diagnosis not present

## 2021-07-03 DIAGNOSIS — Z992 Dependence on renal dialysis: Secondary | ICD-10-CM | POA: Diagnosis not present

## 2021-07-05 DIAGNOSIS — N186 End stage renal disease: Secondary | ICD-10-CM | POA: Diagnosis not present

## 2021-07-05 DIAGNOSIS — Z992 Dependence on renal dialysis: Secondary | ICD-10-CM | POA: Diagnosis not present

## 2021-07-08 DIAGNOSIS — N2581 Secondary hyperparathyroidism of renal origin: Secondary | ICD-10-CM | POA: Diagnosis not present

## 2021-07-08 DIAGNOSIS — Z992 Dependence on renal dialysis: Secondary | ICD-10-CM | POA: Diagnosis not present

## 2021-07-08 DIAGNOSIS — N186 End stage renal disease: Secondary | ICD-10-CM | POA: Diagnosis not present

## 2021-07-12 DIAGNOSIS — Z992 Dependence on renal dialysis: Secondary | ICD-10-CM | POA: Diagnosis not present

## 2021-07-12 DIAGNOSIS — N186 End stage renal disease: Secondary | ICD-10-CM | POA: Diagnosis not present

## 2021-07-12 DIAGNOSIS — N2581 Secondary hyperparathyroidism of renal origin: Secondary | ICD-10-CM | POA: Diagnosis not present

## 2021-07-16 DIAGNOSIS — Z5181 Encounter for therapeutic drug level monitoring: Secondary | ICD-10-CM | POA: Diagnosis not present

## 2021-07-16 DIAGNOSIS — Z992 Dependence on renal dialysis: Secondary | ICD-10-CM | POA: Diagnosis not present

## 2021-07-16 DIAGNOSIS — T8619 Other complication of kidney transplant: Secondary | ICD-10-CM | POA: Diagnosis not present

## 2021-07-16 DIAGNOSIS — Z87891 Personal history of nicotine dependence: Secondary | ICD-10-CM | POA: Diagnosis not present

## 2021-07-16 DIAGNOSIS — Z7722 Contact with and (suspected) exposure to environmental tobacco smoke (acute) (chronic): Secondary | ICD-10-CM | POA: Diagnosis not present

## 2021-07-16 DIAGNOSIS — D84821 Immunodeficiency due to drugs: Secondary | ICD-10-CM | POA: Diagnosis not present

## 2021-07-16 DIAGNOSIS — Z79899 Other long term (current) drug therapy: Secondary | ICD-10-CM | POA: Diagnosis not present

## 2021-07-16 DIAGNOSIS — B259 Cytomegaloviral disease, unspecified: Secondary | ICD-10-CM | POA: Diagnosis not present

## 2021-07-16 DIAGNOSIS — N186 End stage renal disease: Secondary | ICD-10-CM | POA: Diagnosis not present

## 2021-07-16 DIAGNOSIS — Z862 Personal history of diseases of the blood and blood-forming organs and certain disorders involving the immune mechanism: Secondary | ICD-10-CM | POA: Diagnosis not present

## 2021-07-16 DIAGNOSIS — T8612 Kidney transplant failure: Secondary | ICD-10-CM | POA: Diagnosis not present

## 2021-07-16 DIAGNOSIS — D849 Immunodeficiency, unspecified: Secondary | ICD-10-CM | POA: Diagnosis not present

## 2021-07-16 DIAGNOSIS — D649 Anemia, unspecified: Secondary | ICD-10-CM | POA: Diagnosis not present

## 2021-07-16 DIAGNOSIS — I10 Essential (primary) hypertension: Secondary | ICD-10-CM | POA: Diagnosis not present

## 2021-07-16 DIAGNOSIS — N189 Chronic kidney disease, unspecified: Secondary | ICD-10-CM | POA: Diagnosis not present

## 2021-07-16 DIAGNOSIS — Z940 Kidney transplant status: Secondary | ICD-10-CM | POA: Diagnosis not present

## 2021-07-19 DIAGNOSIS — Z992 Dependence on renal dialysis: Secondary | ICD-10-CM | POA: Diagnosis not present

## 2021-07-19 DIAGNOSIS — N2581 Secondary hyperparathyroidism of renal origin: Secondary | ICD-10-CM | POA: Diagnosis not present

## 2021-07-19 DIAGNOSIS — N186 End stage renal disease: Secondary | ICD-10-CM | POA: Diagnosis not present

## 2021-07-22 DIAGNOSIS — N2581 Secondary hyperparathyroidism of renal origin: Secondary | ICD-10-CM | POA: Diagnosis not present

## 2021-07-22 DIAGNOSIS — N186 End stage renal disease: Secondary | ICD-10-CM | POA: Diagnosis not present

## 2021-07-22 DIAGNOSIS — Z992 Dependence on renal dialysis: Secondary | ICD-10-CM | POA: Diagnosis not present

## 2021-07-24 DIAGNOSIS — Z7682 Awaiting organ transplant status: Secondary | ICD-10-CM | POA: Diagnosis not present

## 2021-07-26 DIAGNOSIS — Z992 Dependence on renal dialysis: Secondary | ICD-10-CM | POA: Diagnosis not present

## 2021-07-26 DIAGNOSIS — N2581 Secondary hyperparathyroidism of renal origin: Secondary | ICD-10-CM | POA: Diagnosis not present

## 2021-07-26 DIAGNOSIS — N186 End stage renal disease: Secondary | ICD-10-CM | POA: Diagnosis not present

## 2021-07-31 DIAGNOSIS — N2581 Secondary hyperparathyroidism of renal origin: Secondary | ICD-10-CM | POA: Diagnosis not present

## 2021-07-31 DIAGNOSIS — Z992 Dependence on renal dialysis: Secondary | ICD-10-CM | POA: Diagnosis not present

## 2021-07-31 DIAGNOSIS — N186 End stage renal disease: Secondary | ICD-10-CM | POA: Diagnosis not present

## 2021-08-05 DIAGNOSIS — N2581 Secondary hyperparathyroidism of renal origin: Secondary | ICD-10-CM | POA: Diagnosis not present

## 2021-08-05 DIAGNOSIS — N186 End stage renal disease: Secondary | ICD-10-CM | POA: Diagnosis not present

## 2021-08-05 DIAGNOSIS — Z992 Dependence on renal dialysis: Secondary | ICD-10-CM | POA: Diagnosis not present

## 2021-08-05 DIAGNOSIS — I120 Hypertensive chronic kidney disease with stage 5 chronic kidney disease or end stage renal disease: Secondary | ICD-10-CM | POA: Diagnosis not present

## 2021-08-09 DIAGNOSIS — Z992 Dependence on renal dialysis: Secondary | ICD-10-CM | POA: Diagnosis not present

## 2021-08-09 DIAGNOSIS — N186 End stage renal disease: Secondary | ICD-10-CM | POA: Diagnosis not present

## 2021-08-09 DIAGNOSIS — N2581 Secondary hyperparathyroidism of renal origin: Secondary | ICD-10-CM | POA: Diagnosis not present

## 2021-08-12 DIAGNOSIS — N2581 Secondary hyperparathyroidism of renal origin: Secondary | ICD-10-CM | POA: Diagnosis not present

## 2021-08-12 DIAGNOSIS — N186 End stage renal disease: Secondary | ICD-10-CM | POA: Diagnosis not present

## 2021-08-12 DIAGNOSIS — Z992 Dependence on renal dialysis: Secondary | ICD-10-CM | POA: Diagnosis not present

## 2021-08-14 DIAGNOSIS — N186 End stage renal disease: Secondary | ICD-10-CM | POA: Diagnosis not present

## 2021-08-14 DIAGNOSIS — N2581 Secondary hyperparathyroidism of renal origin: Secondary | ICD-10-CM | POA: Diagnosis not present

## 2021-08-14 DIAGNOSIS — Z992 Dependence on renal dialysis: Secondary | ICD-10-CM | POA: Diagnosis not present

## 2021-08-21 DIAGNOSIS — N186 End stage renal disease: Secondary | ICD-10-CM | POA: Diagnosis not present

## 2021-08-21 DIAGNOSIS — Z992 Dependence on renal dialysis: Secondary | ICD-10-CM | POA: Diagnosis not present

## 2021-08-21 DIAGNOSIS — N2581 Secondary hyperparathyroidism of renal origin: Secondary | ICD-10-CM | POA: Diagnosis not present

## 2021-08-23 DIAGNOSIS — N2581 Secondary hyperparathyroidism of renal origin: Secondary | ICD-10-CM | POA: Diagnosis not present

## 2021-08-23 DIAGNOSIS — N186 End stage renal disease: Secondary | ICD-10-CM | POA: Diagnosis not present

## 2021-08-23 DIAGNOSIS — Z992 Dependence on renal dialysis: Secondary | ICD-10-CM | POA: Diagnosis not present

## 2021-08-28 DIAGNOSIS — N2581 Secondary hyperparathyroidism of renal origin: Secondary | ICD-10-CM | POA: Diagnosis not present

## 2021-08-28 DIAGNOSIS — N186 End stage renal disease: Secondary | ICD-10-CM | POA: Diagnosis not present

## 2021-08-28 DIAGNOSIS — Z992 Dependence on renal dialysis: Secondary | ICD-10-CM | POA: Diagnosis not present

## 2021-09-04 DIAGNOSIS — N186 End stage renal disease: Secondary | ICD-10-CM | POA: Diagnosis not present

## 2021-09-04 DIAGNOSIS — N2581 Secondary hyperparathyroidism of renal origin: Secondary | ICD-10-CM | POA: Diagnosis not present

## 2021-09-04 DIAGNOSIS — Z992 Dependence on renal dialysis: Secondary | ICD-10-CM | POA: Diagnosis not present

## 2021-09-05 DIAGNOSIS — N186 End stage renal disease: Secondary | ICD-10-CM | POA: Diagnosis not present

## 2021-09-05 DIAGNOSIS — Z992 Dependence on renal dialysis: Secondary | ICD-10-CM | POA: Diagnosis not present

## 2021-09-05 DIAGNOSIS — I120 Hypertensive chronic kidney disease with stage 5 chronic kidney disease or end stage renal disease: Secondary | ICD-10-CM | POA: Diagnosis not present

## 2021-09-06 DIAGNOSIS — N186 End stage renal disease: Secondary | ICD-10-CM | POA: Diagnosis not present

## 2021-09-06 DIAGNOSIS — Z992 Dependence on renal dialysis: Secondary | ICD-10-CM | POA: Diagnosis not present

## 2021-09-06 DIAGNOSIS — N2581 Secondary hyperparathyroidism of renal origin: Secondary | ICD-10-CM | POA: Diagnosis not present

## 2021-09-11 DIAGNOSIS — N2581 Secondary hyperparathyroidism of renal origin: Secondary | ICD-10-CM | POA: Diagnosis not present

## 2021-09-11 DIAGNOSIS — N186 End stage renal disease: Secondary | ICD-10-CM | POA: Diagnosis not present

## 2021-09-11 DIAGNOSIS — Z992 Dependence on renal dialysis: Secondary | ICD-10-CM | POA: Diagnosis not present

## 2021-09-13 DIAGNOSIS — Z992 Dependence on renal dialysis: Secondary | ICD-10-CM | POA: Diagnosis not present

## 2021-09-13 DIAGNOSIS — N186 End stage renal disease: Secondary | ICD-10-CM | POA: Diagnosis not present

## 2021-09-13 DIAGNOSIS — N2581 Secondary hyperparathyroidism of renal origin: Secondary | ICD-10-CM | POA: Diagnosis not present

## 2021-09-16 DIAGNOSIS — N186 End stage renal disease: Secondary | ICD-10-CM | POA: Diagnosis not present

## 2021-09-16 DIAGNOSIS — Z992 Dependence on renal dialysis: Secondary | ICD-10-CM | POA: Diagnosis not present

## 2021-09-16 DIAGNOSIS — N2581 Secondary hyperparathyroidism of renal origin: Secondary | ICD-10-CM | POA: Diagnosis not present

## 2021-09-18 DIAGNOSIS — N2581 Secondary hyperparathyroidism of renal origin: Secondary | ICD-10-CM | POA: Diagnosis not present

## 2021-09-18 DIAGNOSIS — N186 End stage renal disease: Secondary | ICD-10-CM | POA: Diagnosis not present

## 2021-09-18 DIAGNOSIS — Z992 Dependence on renal dialysis: Secondary | ICD-10-CM | POA: Diagnosis not present

## 2021-09-25 DIAGNOSIS — Z992 Dependence on renal dialysis: Secondary | ICD-10-CM | POA: Diagnosis not present

## 2021-09-25 DIAGNOSIS — N2581 Secondary hyperparathyroidism of renal origin: Secondary | ICD-10-CM | POA: Diagnosis not present

## 2021-09-25 DIAGNOSIS — N186 End stage renal disease: Secondary | ICD-10-CM | POA: Diagnosis not present

## 2021-09-27 DIAGNOSIS — Z992 Dependence on renal dialysis: Secondary | ICD-10-CM | POA: Diagnosis not present

## 2021-09-27 DIAGNOSIS — N186 End stage renal disease: Secondary | ICD-10-CM | POA: Diagnosis not present

## 2021-09-27 DIAGNOSIS — N2581 Secondary hyperparathyroidism of renal origin: Secondary | ICD-10-CM | POA: Diagnosis not present

## 2021-09-30 DIAGNOSIS — Z992 Dependence on renal dialysis: Secondary | ICD-10-CM | POA: Diagnosis not present

## 2021-09-30 DIAGNOSIS — N2581 Secondary hyperparathyroidism of renal origin: Secondary | ICD-10-CM | POA: Diagnosis not present

## 2021-09-30 DIAGNOSIS — N186 End stage renal disease: Secondary | ICD-10-CM | POA: Diagnosis not present

## 2021-10-02 DIAGNOSIS — Z992 Dependence on renal dialysis: Secondary | ICD-10-CM | POA: Diagnosis not present

## 2021-10-02 DIAGNOSIS — N186 End stage renal disease: Secondary | ICD-10-CM | POA: Diagnosis not present

## 2021-10-02 DIAGNOSIS — N2581 Secondary hyperparathyroidism of renal origin: Secondary | ICD-10-CM | POA: Diagnosis not present

## 2021-10-05 DIAGNOSIS — Z992 Dependence on renal dialysis: Secondary | ICD-10-CM | POA: Diagnosis not present

## 2021-10-05 DIAGNOSIS — N186 End stage renal disease: Secondary | ICD-10-CM | POA: Diagnosis not present

## 2021-10-05 DIAGNOSIS — I120 Hypertensive chronic kidney disease with stage 5 chronic kidney disease or end stage renal disease: Secondary | ICD-10-CM | POA: Diagnosis not present

## 2021-10-07 DIAGNOSIS — N186 End stage renal disease: Secondary | ICD-10-CM | POA: Diagnosis not present

## 2021-10-07 DIAGNOSIS — N2581 Secondary hyperparathyroidism of renal origin: Secondary | ICD-10-CM | POA: Diagnosis not present

## 2021-10-07 DIAGNOSIS — Z992 Dependence on renal dialysis: Secondary | ICD-10-CM | POA: Diagnosis not present

## 2021-10-09 DIAGNOSIS — N186 End stage renal disease: Secondary | ICD-10-CM | POA: Diagnosis not present

## 2021-10-09 DIAGNOSIS — Z992 Dependence on renal dialysis: Secondary | ICD-10-CM | POA: Diagnosis not present

## 2021-10-09 DIAGNOSIS — N2581 Secondary hyperparathyroidism of renal origin: Secondary | ICD-10-CM | POA: Diagnosis not present

## 2021-10-14 DIAGNOSIS — Z992 Dependence on renal dialysis: Secondary | ICD-10-CM | POA: Diagnosis not present

## 2021-10-14 DIAGNOSIS — N2581 Secondary hyperparathyroidism of renal origin: Secondary | ICD-10-CM | POA: Diagnosis not present

## 2021-10-14 DIAGNOSIS — N186 End stage renal disease: Secondary | ICD-10-CM | POA: Diagnosis not present

## 2021-10-16 DIAGNOSIS — N186 End stage renal disease: Secondary | ICD-10-CM | POA: Diagnosis not present

## 2021-10-16 DIAGNOSIS — Z992 Dependence on renal dialysis: Secondary | ICD-10-CM | POA: Diagnosis not present

## 2021-10-16 DIAGNOSIS — N2581 Secondary hyperparathyroidism of renal origin: Secondary | ICD-10-CM | POA: Diagnosis not present

## 2021-10-21 DIAGNOSIS — Z992 Dependence on renal dialysis: Secondary | ICD-10-CM | POA: Diagnosis not present

## 2021-10-21 DIAGNOSIS — N2581 Secondary hyperparathyroidism of renal origin: Secondary | ICD-10-CM | POA: Diagnosis not present

## 2021-10-21 DIAGNOSIS — N186 End stage renal disease: Secondary | ICD-10-CM | POA: Diagnosis not present

## 2021-10-25 DIAGNOSIS — N2581 Secondary hyperparathyroidism of renal origin: Secondary | ICD-10-CM | POA: Diagnosis not present

## 2021-10-25 DIAGNOSIS — N186 End stage renal disease: Secondary | ICD-10-CM | POA: Diagnosis not present

## 2021-10-25 DIAGNOSIS — Z992 Dependence on renal dialysis: Secondary | ICD-10-CM | POA: Diagnosis not present

## 2021-10-30 DIAGNOSIS — N2581 Secondary hyperparathyroidism of renal origin: Secondary | ICD-10-CM | POA: Diagnosis not present

## 2021-10-30 DIAGNOSIS — Z992 Dependence on renal dialysis: Secondary | ICD-10-CM | POA: Diagnosis not present

## 2021-10-30 DIAGNOSIS — N186 End stage renal disease: Secondary | ICD-10-CM | POA: Diagnosis not present

## 2021-11-02 DIAGNOSIS — N2581 Secondary hyperparathyroidism of renal origin: Secondary | ICD-10-CM | POA: Diagnosis not present

## 2021-11-02 DIAGNOSIS — Z992 Dependence on renal dialysis: Secondary | ICD-10-CM | POA: Diagnosis not present

## 2021-11-02 DIAGNOSIS — N186 End stage renal disease: Secondary | ICD-10-CM | POA: Diagnosis not present

## 2021-11-04 DIAGNOSIS — N186 End stage renal disease: Secondary | ICD-10-CM | POA: Diagnosis not present

## 2021-11-04 DIAGNOSIS — Z992 Dependence on renal dialysis: Secondary | ICD-10-CM | POA: Diagnosis not present

## 2021-11-04 DIAGNOSIS — N2581 Secondary hyperparathyroidism of renal origin: Secondary | ICD-10-CM | POA: Diagnosis not present

## 2021-11-05 DIAGNOSIS — Z992 Dependence on renal dialysis: Secondary | ICD-10-CM | POA: Diagnosis not present

## 2021-11-05 DIAGNOSIS — N186 End stage renal disease: Secondary | ICD-10-CM | POA: Diagnosis not present

## 2021-11-05 DIAGNOSIS — I120 Hypertensive chronic kidney disease with stage 5 chronic kidney disease or end stage renal disease: Secondary | ICD-10-CM | POA: Diagnosis not present

## 2021-11-08 DIAGNOSIS — Z992 Dependence on renal dialysis: Secondary | ICD-10-CM | POA: Diagnosis not present

## 2021-11-08 DIAGNOSIS — N2581 Secondary hyperparathyroidism of renal origin: Secondary | ICD-10-CM | POA: Diagnosis not present

## 2021-11-08 DIAGNOSIS — N186 End stage renal disease: Secondary | ICD-10-CM | POA: Diagnosis not present

## 2021-11-11 DIAGNOSIS — N2581 Secondary hyperparathyroidism of renal origin: Secondary | ICD-10-CM | POA: Diagnosis not present

## 2021-11-11 DIAGNOSIS — N186 End stage renal disease: Secondary | ICD-10-CM | POA: Diagnosis not present

## 2021-11-11 DIAGNOSIS — Z992 Dependence on renal dialysis: Secondary | ICD-10-CM | POA: Diagnosis not present

## 2021-11-15 DIAGNOSIS — Z992 Dependence on renal dialysis: Secondary | ICD-10-CM | POA: Diagnosis not present

## 2021-11-15 DIAGNOSIS — N186 End stage renal disease: Secondary | ICD-10-CM | POA: Diagnosis not present

## 2021-11-15 DIAGNOSIS — N2581 Secondary hyperparathyroidism of renal origin: Secondary | ICD-10-CM | POA: Diagnosis not present

## 2021-11-20 DIAGNOSIS — Z992 Dependence on renal dialysis: Secondary | ICD-10-CM | POA: Diagnosis not present

## 2021-11-20 DIAGNOSIS — N186 End stage renal disease: Secondary | ICD-10-CM | POA: Diagnosis not present

## 2021-11-20 DIAGNOSIS — N2581 Secondary hyperparathyroidism of renal origin: Secondary | ICD-10-CM | POA: Diagnosis not present

## 2021-11-22 DIAGNOSIS — Z87891 Personal history of nicotine dependence: Secondary | ICD-10-CM | POA: Diagnosis not present

## 2021-11-22 DIAGNOSIS — Z940 Kidney transplant status: Secondary | ICD-10-CM | POA: Diagnosis not present

## 2021-11-22 DIAGNOSIS — D84821 Immunodeficiency due to drugs: Secondary | ICD-10-CM | POA: Diagnosis not present

## 2021-11-22 DIAGNOSIS — Z992 Dependence on renal dialysis: Secondary | ICD-10-CM | POA: Diagnosis not present

## 2021-11-22 DIAGNOSIS — T8611 Kidney transplant rejection: Secondary | ICD-10-CM | POA: Diagnosis not present

## 2021-11-22 DIAGNOSIS — D849 Immunodeficiency, unspecified: Secondary | ICD-10-CM | POA: Diagnosis not present

## 2021-11-22 DIAGNOSIS — Z79899 Other long term (current) drug therapy: Secondary | ICD-10-CM | POA: Diagnosis not present

## 2021-11-22 DIAGNOSIS — N185 Chronic kidney disease, stage 5: Secondary | ICD-10-CM | POA: Diagnosis not present

## 2021-11-22 DIAGNOSIS — T8612 Kidney transplant failure: Secondary | ICD-10-CM | POA: Diagnosis not present

## 2021-11-22 DIAGNOSIS — Z79624 Long term (current) use of inhibitors of nucleotide synthesis: Secondary | ICD-10-CM | POA: Diagnosis not present

## 2021-11-25 DIAGNOSIS — Z992 Dependence on renal dialysis: Secondary | ICD-10-CM | POA: Diagnosis not present

## 2021-11-25 DIAGNOSIS — N2581 Secondary hyperparathyroidism of renal origin: Secondary | ICD-10-CM | POA: Diagnosis not present

## 2021-11-25 DIAGNOSIS — N186 End stage renal disease: Secondary | ICD-10-CM | POA: Diagnosis not present

## 2021-11-27 DIAGNOSIS — N186 End stage renal disease: Secondary | ICD-10-CM | POA: Diagnosis not present

## 2021-11-27 DIAGNOSIS — Z992 Dependence on renal dialysis: Secondary | ICD-10-CM | POA: Diagnosis not present

## 2021-11-27 DIAGNOSIS — N2581 Secondary hyperparathyroidism of renal origin: Secondary | ICD-10-CM | POA: Diagnosis not present

## 2021-11-29 DIAGNOSIS — N186 End stage renal disease: Secondary | ICD-10-CM | POA: Diagnosis not present

## 2021-11-29 DIAGNOSIS — N2581 Secondary hyperparathyroidism of renal origin: Secondary | ICD-10-CM | POA: Diagnosis not present

## 2021-11-29 DIAGNOSIS — Z992 Dependence on renal dialysis: Secondary | ICD-10-CM | POA: Diagnosis not present

## 2021-12-02 DIAGNOSIS — N186 End stage renal disease: Secondary | ICD-10-CM | POA: Diagnosis not present

## 2021-12-02 DIAGNOSIS — N2581 Secondary hyperparathyroidism of renal origin: Secondary | ICD-10-CM | POA: Diagnosis not present

## 2021-12-02 DIAGNOSIS — Z992 Dependence on renal dialysis: Secondary | ICD-10-CM | POA: Diagnosis not present

## 2021-12-06 DIAGNOSIS — I120 Hypertensive chronic kidney disease with stage 5 chronic kidney disease or end stage renal disease: Secondary | ICD-10-CM | POA: Diagnosis not present

## 2021-12-06 DIAGNOSIS — N186 End stage renal disease: Secondary | ICD-10-CM | POA: Diagnosis not present

## 2021-12-06 DIAGNOSIS — Z992 Dependence on renal dialysis: Secondary | ICD-10-CM | POA: Diagnosis not present

## 2021-12-06 DIAGNOSIS — N2581 Secondary hyperparathyroidism of renal origin: Secondary | ICD-10-CM | POA: Diagnosis not present

## 2021-12-09 DIAGNOSIS — N2581 Secondary hyperparathyroidism of renal origin: Secondary | ICD-10-CM | POA: Diagnosis not present

## 2021-12-09 DIAGNOSIS — Z992 Dependence on renal dialysis: Secondary | ICD-10-CM | POA: Diagnosis not present

## 2021-12-09 DIAGNOSIS — N186 End stage renal disease: Secondary | ICD-10-CM | POA: Diagnosis not present

## 2021-12-13 DIAGNOSIS — N2581 Secondary hyperparathyroidism of renal origin: Secondary | ICD-10-CM | POA: Diagnosis not present

## 2021-12-13 DIAGNOSIS — Z992 Dependence on renal dialysis: Secondary | ICD-10-CM | POA: Diagnosis not present

## 2021-12-13 DIAGNOSIS — N186 End stage renal disease: Secondary | ICD-10-CM | POA: Diagnosis not present

## 2021-12-18 DIAGNOSIS — N186 End stage renal disease: Secondary | ICD-10-CM | POA: Diagnosis not present

## 2021-12-18 DIAGNOSIS — N2581 Secondary hyperparathyroidism of renal origin: Secondary | ICD-10-CM | POA: Diagnosis not present

## 2021-12-18 DIAGNOSIS — Z992 Dependence on renal dialysis: Secondary | ICD-10-CM | POA: Diagnosis not present

## 2021-12-20 DIAGNOSIS — Z992 Dependence on renal dialysis: Secondary | ICD-10-CM | POA: Diagnosis not present

## 2021-12-20 DIAGNOSIS — N2581 Secondary hyperparathyroidism of renal origin: Secondary | ICD-10-CM | POA: Diagnosis not present

## 2021-12-20 DIAGNOSIS — N186 End stage renal disease: Secondary | ICD-10-CM | POA: Diagnosis not present

## 2021-12-25 DIAGNOSIS — N186 End stage renal disease: Secondary | ICD-10-CM | POA: Diagnosis not present

## 2021-12-25 DIAGNOSIS — N2581 Secondary hyperparathyroidism of renal origin: Secondary | ICD-10-CM | POA: Diagnosis not present

## 2021-12-25 DIAGNOSIS — Z992 Dependence on renal dialysis: Secondary | ICD-10-CM | POA: Diagnosis not present

## 2021-12-27 DIAGNOSIS — Z992 Dependence on renal dialysis: Secondary | ICD-10-CM | POA: Diagnosis not present

## 2021-12-27 DIAGNOSIS — N2581 Secondary hyperparathyroidism of renal origin: Secondary | ICD-10-CM | POA: Diagnosis not present

## 2021-12-27 DIAGNOSIS — N186 End stage renal disease: Secondary | ICD-10-CM | POA: Diagnosis not present

## 2022-01-01 DIAGNOSIS — N186 End stage renal disease: Secondary | ICD-10-CM | POA: Diagnosis not present

## 2022-01-01 DIAGNOSIS — Z992 Dependence on renal dialysis: Secondary | ICD-10-CM | POA: Diagnosis not present

## 2022-01-01 DIAGNOSIS — N2581 Secondary hyperparathyroidism of renal origin: Secondary | ICD-10-CM | POA: Diagnosis not present

## 2022-01-03 DIAGNOSIS — N186 End stage renal disease: Secondary | ICD-10-CM | POA: Diagnosis not present

## 2022-01-03 DIAGNOSIS — N2581 Secondary hyperparathyroidism of renal origin: Secondary | ICD-10-CM | POA: Diagnosis not present

## 2022-01-03 DIAGNOSIS — Z992 Dependence on renal dialysis: Secondary | ICD-10-CM | POA: Diagnosis not present

## 2022-01-05 DIAGNOSIS — N186 End stage renal disease: Secondary | ICD-10-CM | POA: Diagnosis not present

## 2022-01-05 DIAGNOSIS — I120 Hypertensive chronic kidney disease with stage 5 chronic kidney disease or end stage renal disease: Secondary | ICD-10-CM | POA: Diagnosis not present

## 2022-01-05 DIAGNOSIS — Z992 Dependence on renal dialysis: Secondary | ICD-10-CM | POA: Diagnosis not present

## 2022-01-06 DIAGNOSIS — N186 End stage renal disease: Secondary | ICD-10-CM | POA: Diagnosis not present

## 2022-01-06 DIAGNOSIS — N2581 Secondary hyperparathyroidism of renal origin: Secondary | ICD-10-CM | POA: Diagnosis not present

## 2022-01-06 DIAGNOSIS — Z23 Encounter for immunization: Secondary | ICD-10-CM | POA: Diagnosis not present

## 2022-01-06 DIAGNOSIS — Z992 Dependence on renal dialysis: Secondary | ICD-10-CM | POA: Diagnosis not present

## 2022-01-10 DIAGNOSIS — N186 End stage renal disease: Secondary | ICD-10-CM | POA: Diagnosis not present

## 2022-01-10 DIAGNOSIS — Z23 Encounter for immunization: Secondary | ICD-10-CM | POA: Diagnosis not present

## 2022-01-10 DIAGNOSIS — Z992 Dependence on renal dialysis: Secondary | ICD-10-CM | POA: Diagnosis not present

## 2022-01-10 DIAGNOSIS — N2581 Secondary hyperparathyroidism of renal origin: Secondary | ICD-10-CM | POA: Diagnosis not present

## 2022-01-13 DIAGNOSIS — Z23 Encounter for immunization: Secondary | ICD-10-CM | POA: Diagnosis not present

## 2022-01-13 DIAGNOSIS — N186 End stage renal disease: Secondary | ICD-10-CM | POA: Diagnosis not present

## 2022-01-13 DIAGNOSIS — N2581 Secondary hyperparathyroidism of renal origin: Secondary | ICD-10-CM | POA: Diagnosis not present

## 2022-01-13 DIAGNOSIS — Z992 Dependence on renal dialysis: Secondary | ICD-10-CM | POA: Diagnosis not present

## 2022-01-15 DIAGNOSIS — Z992 Dependence on renal dialysis: Secondary | ICD-10-CM | POA: Diagnosis not present

## 2022-01-15 DIAGNOSIS — N186 End stage renal disease: Secondary | ICD-10-CM | POA: Diagnosis not present

## 2022-01-15 DIAGNOSIS — N2581 Secondary hyperparathyroidism of renal origin: Secondary | ICD-10-CM | POA: Diagnosis not present

## 2022-01-15 DIAGNOSIS — Z23 Encounter for immunization: Secondary | ICD-10-CM | POA: Diagnosis not present

## 2022-01-20 DIAGNOSIS — Z992 Dependence on renal dialysis: Secondary | ICD-10-CM | POA: Diagnosis not present

## 2022-01-20 DIAGNOSIS — N2581 Secondary hyperparathyroidism of renal origin: Secondary | ICD-10-CM | POA: Diagnosis not present

## 2022-01-20 DIAGNOSIS — N186 End stage renal disease: Secondary | ICD-10-CM | POA: Diagnosis not present

## 2022-01-20 DIAGNOSIS — Z23 Encounter for immunization: Secondary | ICD-10-CM | POA: Diagnosis not present

## 2022-01-24 DIAGNOSIS — Z992 Dependence on renal dialysis: Secondary | ICD-10-CM | POA: Diagnosis not present

## 2022-01-24 DIAGNOSIS — N2581 Secondary hyperparathyroidism of renal origin: Secondary | ICD-10-CM | POA: Diagnosis not present

## 2022-01-24 DIAGNOSIS — N186 End stage renal disease: Secondary | ICD-10-CM | POA: Diagnosis not present

## 2022-01-24 DIAGNOSIS — Z23 Encounter for immunization: Secondary | ICD-10-CM | POA: Diagnosis not present

## 2022-01-29 DIAGNOSIS — F17200 Nicotine dependence, unspecified, uncomplicated: Secondary | ICD-10-CM | POA: Diagnosis not present

## 2022-01-29 DIAGNOSIS — L0231 Cutaneous abscess of buttock: Secondary | ICD-10-CM | POA: Diagnosis not present

## 2022-01-29 DIAGNOSIS — Z940 Kidney transplant status: Secondary | ICD-10-CM | POA: Diagnosis not present

## 2022-01-29 DIAGNOSIS — N186 End stage renal disease: Secondary | ICD-10-CM | POA: Diagnosis not present

## 2022-01-29 DIAGNOSIS — Z992 Dependence on renal dialysis: Secondary | ICD-10-CM | POA: Diagnosis not present

## 2022-01-31 DIAGNOSIS — Z23 Encounter for immunization: Secondary | ICD-10-CM | POA: Diagnosis not present

## 2022-01-31 DIAGNOSIS — N2581 Secondary hyperparathyroidism of renal origin: Secondary | ICD-10-CM | POA: Diagnosis not present

## 2022-01-31 DIAGNOSIS — N186 End stage renal disease: Secondary | ICD-10-CM | POA: Diagnosis not present

## 2022-01-31 DIAGNOSIS — Z992 Dependence on renal dialysis: Secondary | ICD-10-CM | POA: Diagnosis not present

## 2022-02-03 DIAGNOSIS — Z23 Encounter for immunization: Secondary | ICD-10-CM | POA: Diagnosis not present

## 2022-02-03 DIAGNOSIS — N2581 Secondary hyperparathyroidism of renal origin: Secondary | ICD-10-CM | POA: Diagnosis not present

## 2022-02-03 DIAGNOSIS — N186 End stage renal disease: Secondary | ICD-10-CM | POA: Diagnosis not present

## 2022-02-03 DIAGNOSIS — Z992 Dependence on renal dialysis: Secondary | ICD-10-CM | POA: Diagnosis not present

## 2022-02-05 DIAGNOSIS — Z992 Dependence on renal dialysis: Secondary | ICD-10-CM | POA: Diagnosis not present

## 2022-02-05 DIAGNOSIS — N186 End stage renal disease: Secondary | ICD-10-CM | POA: Diagnosis not present

## 2022-02-05 DIAGNOSIS — I120 Hypertensive chronic kidney disease with stage 5 chronic kidney disease or end stage renal disease: Secondary | ICD-10-CM | POA: Diagnosis not present

## 2022-02-07 DIAGNOSIS — N186 End stage renal disease: Secondary | ICD-10-CM | POA: Diagnosis not present

## 2022-02-07 DIAGNOSIS — N2581 Secondary hyperparathyroidism of renal origin: Secondary | ICD-10-CM | POA: Diagnosis not present

## 2022-02-07 DIAGNOSIS — Z992 Dependence on renal dialysis: Secondary | ICD-10-CM | POA: Diagnosis not present

## 2022-02-07 DIAGNOSIS — R519 Headache, unspecified: Secondary | ICD-10-CM | POA: Diagnosis not present

## 2022-02-12 DIAGNOSIS — N2581 Secondary hyperparathyroidism of renal origin: Secondary | ICD-10-CM | POA: Diagnosis not present

## 2022-02-12 DIAGNOSIS — Z992 Dependence on renal dialysis: Secondary | ICD-10-CM | POA: Diagnosis not present

## 2022-02-12 DIAGNOSIS — N186 End stage renal disease: Secondary | ICD-10-CM | POA: Diagnosis not present

## 2022-02-12 DIAGNOSIS — R519 Headache, unspecified: Secondary | ICD-10-CM | POA: Diagnosis not present

## 2022-02-14 DIAGNOSIS — R519 Headache, unspecified: Secondary | ICD-10-CM | POA: Diagnosis not present

## 2022-02-14 DIAGNOSIS — N2581 Secondary hyperparathyroidism of renal origin: Secondary | ICD-10-CM | POA: Diagnosis not present

## 2022-02-14 DIAGNOSIS — N186 End stage renal disease: Secondary | ICD-10-CM | POA: Diagnosis not present

## 2022-02-14 DIAGNOSIS — Z992 Dependence on renal dialysis: Secondary | ICD-10-CM | POA: Diagnosis not present

## 2022-02-17 DIAGNOSIS — Z992 Dependence on renal dialysis: Secondary | ICD-10-CM | POA: Diagnosis not present

## 2022-02-17 DIAGNOSIS — N186 End stage renal disease: Secondary | ICD-10-CM | POA: Diagnosis not present

## 2022-02-17 DIAGNOSIS — N2581 Secondary hyperparathyroidism of renal origin: Secondary | ICD-10-CM | POA: Diagnosis not present

## 2022-02-17 DIAGNOSIS — R519 Headache, unspecified: Secondary | ICD-10-CM | POA: Diagnosis not present

## 2022-02-19 DIAGNOSIS — Z992 Dependence on renal dialysis: Secondary | ICD-10-CM | POA: Diagnosis not present

## 2022-02-19 DIAGNOSIS — N2581 Secondary hyperparathyroidism of renal origin: Secondary | ICD-10-CM | POA: Diagnosis not present

## 2022-02-19 DIAGNOSIS — N186 End stage renal disease: Secondary | ICD-10-CM | POA: Diagnosis not present

## 2022-02-19 DIAGNOSIS — R519 Headache, unspecified: Secondary | ICD-10-CM | POA: Diagnosis not present

## 2022-02-22 DIAGNOSIS — R519 Headache, unspecified: Secondary | ICD-10-CM | POA: Diagnosis not present

## 2022-02-22 DIAGNOSIS — N2581 Secondary hyperparathyroidism of renal origin: Secondary | ICD-10-CM | POA: Diagnosis not present

## 2022-02-22 DIAGNOSIS — Z992 Dependence on renal dialysis: Secondary | ICD-10-CM | POA: Diagnosis not present

## 2022-02-22 DIAGNOSIS — N186 End stage renal disease: Secondary | ICD-10-CM | POA: Diagnosis not present

## 2022-02-24 DIAGNOSIS — N186 End stage renal disease: Secondary | ICD-10-CM | POA: Diagnosis not present

## 2022-02-24 DIAGNOSIS — Z992 Dependence on renal dialysis: Secondary | ICD-10-CM | POA: Diagnosis not present

## 2022-02-24 DIAGNOSIS — N2581 Secondary hyperparathyroidism of renal origin: Secondary | ICD-10-CM | POA: Diagnosis not present

## 2022-02-24 DIAGNOSIS — R519 Headache, unspecified: Secondary | ICD-10-CM | POA: Diagnosis not present

## 2022-03-03 DIAGNOSIS — N2581 Secondary hyperparathyroidism of renal origin: Secondary | ICD-10-CM | POA: Diagnosis not present

## 2022-03-03 DIAGNOSIS — Z992 Dependence on renal dialysis: Secondary | ICD-10-CM | POA: Diagnosis not present

## 2022-03-03 DIAGNOSIS — N186 End stage renal disease: Secondary | ICD-10-CM | POA: Diagnosis not present

## 2022-03-03 DIAGNOSIS — R519 Headache, unspecified: Secondary | ICD-10-CM | POA: Diagnosis not present

## 2022-03-05 DIAGNOSIS — R519 Headache, unspecified: Secondary | ICD-10-CM | POA: Diagnosis not present

## 2022-03-05 DIAGNOSIS — N2581 Secondary hyperparathyroidism of renal origin: Secondary | ICD-10-CM | POA: Diagnosis not present

## 2022-03-05 DIAGNOSIS — N186 End stage renal disease: Secondary | ICD-10-CM | POA: Diagnosis not present

## 2022-03-05 DIAGNOSIS — Z992 Dependence on renal dialysis: Secondary | ICD-10-CM | POA: Diagnosis not present

## 2022-03-07 DIAGNOSIS — Z992 Dependence on renal dialysis: Secondary | ICD-10-CM | POA: Diagnosis not present

## 2022-03-07 DIAGNOSIS — I120 Hypertensive chronic kidney disease with stage 5 chronic kidney disease or end stage renal disease: Secondary | ICD-10-CM | POA: Diagnosis not present

## 2022-03-07 DIAGNOSIS — N186 End stage renal disease: Secondary | ICD-10-CM | POA: Diagnosis not present

## 2022-03-10 ENCOUNTER — Telehealth: Payer: Self-pay | Admitting: Family Medicine

## 2022-03-10 DIAGNOSIS — N2581 Secondary hyperparathyroidism of renal origin: Secondary | ICD-10-CM | POA: Diagnosis not present

## 2022-03-10 DIAGNOSIS — R509 Fever, unspecified: Secondary | ICD-10-CM | POA: Diagnosis not present

## 2022-03-10 DIAGNOSIS — Z992 Dependence on renal dialysis: Secondary | ICD-10-CM | POA: Diagnosis not present

## 2022-03-10 DIAGNOSIS — N189 Chronic kidney disease, unspecified: Secondary | ICD-10-CM | POA: Diagnosis not present

## 2022-03-10 DIAGNOSIS — N186 End stage renal disease: Secondary | ICD-10-CM | POA: Diagnosis not present

## 2022-03-10 NOTE — Telephone Encounter (Signed)
Left message for patient to call back and schedule Medicare Annual Wellness Visit (AWV) either virtually or phone. Left  my jabber number [phone]     awv-i per [place] 03/07/17 please schedule with hannah kim   45 min for awv-i and in office appointments 30 min for awv-s  phone/virtual appointments

## 2022-03-11 DIAGNOSIS — D849 Immunodeficiency, unspecified: Secondary | ICD-10-CM | POA: Diagnosis not present

## 2022-03-11 DIAGNOSIS — K0889 Other specified disorders of teeth and supporting structures: Secondary | ICD-10-CM | POA: Diagnosis not present

## 2022-03-11 DIAGNOSIS — I120 Hypertensive chronic kidney disease with stage 5 chronic kidney disease or end stage renal disease: Secondary | ICD-10-CM | POA: Diagnosis not present

## 2022-03-11 DIAGNOSIS — I10 Essential (primary) hypertension: Secondary | ICD-10-CM | POA: Diagnosis not present

## 2022-03-11 DIAGNOSIS — N186 End stage renal disease: Secondary | ICD-10-CM | POA: Diagnosis not present

## 2022-03-11 DIAGNOSIS — Z79621 Long term (current) use of calcineurin inhibitor: Secondary | ICD-10-CM | POA: Diagnosis not present

## 2022-03-11 DIAGNOSIS — Z7982 Long term (current) use of aspirin: Secondary | ICD-10-CM | POA: Diagnosis not present

## 2022-03-11 DIAGNOSIS — Z79899 Other long term (current) drug therapy: Secondary | ICD-10-CM | POA: Diagnosis not present

## 2022-03-11 DIAGNOSIS — Z7952 Long term (current) use of systemic steroids: Secondary | ICD-10-CM | POA: Diagnosis not present

## 2022-03-11 DIAGNOSIS — Z87891 Personal history of nicotine dependence: Secondary | ICD-10-CM | POA: Diagnosis not present

## 2022-03-11 DIAGNOSIS — Z720 Tobacco use: Secondary | ICD-10-CM | POA: Diagnosis not present

## 2022-03-11 DIAGNOSIS — Z992 Dependence on renal dialysis: Secondary | ICD-10-CM | POA: Diagnosis not present

## 2022-03-11 DIAGNOSIS — Z79624 Long term (current) use of inhibitors of nucleotide synthesis: Secondary | ICD-10-CM | POA: Diagnosis not present

## 2022-03-11 DIAGNOSIS — T8612 Kidney transplant failure: Secondary | ICD-10-CM | POA: Diagnosis not present

## 2022-03-11 DIAGNOSIS — Z7682 Awaiting organ transplant status: Secondary | ICD-10-CM | POA: Diagnosis not present

## 2022-03-14 DIAGNOSIS — N2581 Secondary hyperparathyroidism of renal origin: Secondary | ICD-10-CM | POA: Diagnosis not present

## 2022-03-14 DIAGNOSIS — N189 Chronic kidney disease, unspecified: Secondary | ICD-10-CM | POA: Diagnosis not present

## 2022-03-14 DIAGNOSIS — Z992 Dependence on renal dialysis: Secondary | ICD-10-CM | POA: Diagnosis not present

## 2022-03-14 DIAGNOSIS — N186 End stage renal disease: Secondary | ICD-10-CM | POA: Diagnosis not present

## 2022-03-14 DIAGNOSIS — R509 Fever, unspecified: Secondary | ICD-10-CM | POA: Diagnosis not present

## 2022-03-15 DIAGNOSIS — N2581 Secondary hyperparathyroidism of renal origin: Secondary | ICD-10-CM | POA: Diagnosis not present

## 2022-03-15 DIAGNOSIS — Z992 Dependence on renal dialysis: Secondary | ICD-10-CM | POA: Diagnosis not present

## 2022-03-15 DIAGNOSIS — N186 End stage renal disease: Secondary | ICD-10-CM | POA: Diagnosis not present

## 2022-03-15 DIAGNOSIS — N189 Chronic kidney disease, unspecified: Secondary | ICD-10-CM | POA: Diagnosis not present

## 2022-03-15 DIAGNOSIS — R509 Fever, unspecified: Secondary | ICD-10-CM | POA: Diagnosis not present

## 2022-03-19 DIAGNOSIS — R509 Fever, unspecified: Secondary | ICD-10-CM | POA: Diagnosis not present

## 2022-03-19 DIAGNOSIS — N2581 Secondary hyperparathyroidism of renal origin: Secondary | ICD-10-CM | POA: Diagnosis not present

## 2022-03-19 DIAGNOSIS — Z992 Dependence on renal dialysis: Secondary | ICD-10-CM | POA: Diagnosis not present

## 2022-03-19 DIAGNOSIS — N189 Chronic kidney disease, unspecified: Secondary | ICD-10-CM | POA: Diagnosis not present

## 2022-03-19 DIAGNOSIS — N186 End stage renal disease: Secondary | ICD-10-CM | POA: Diagnosis not present

## 2022-03-21 DIAGNOSIS — N186 End stage renal disease: Secondary | ICD-10-CM | POA: Diagnosis not present

## 2022-03-21 DIAGNOSIS — R509 Fever, unspecified: Secondary | ICD-10-CM | POA: Diagnosis not present

## 2022-03-21 DIAGNOSIS — N189 Chronic kidney disease, unspecified: Secondary | ICD-10-CM | POA: Diagnosis not present

## 2022-03-21 DIAGNOSIS — Z992 Dependence on renal dialysis: Secondary | ICD-10-CM | POA: Diagnosis not present

## 2022-03-21 DIAGNOSIS — N2581 Secondary hyperparathyroidism of renal origin: Secondary | ICD-10-CM | POA: Diagnosis not present

## 2022-03-24 DIAGNOSIS — N2581 Secondary hyperparathyroidism of renal origin: Secondary | ICD-10-CM | POA: Diagnosis not present

## 2022-03-24 DIAGNOSIS — N189 Chronic kidney disease, unspecified: Secondary | ICD-10-CM | POA: Diagnosis not present

## 2022-03-24 DIAGNOSIS — N186 End stage renal disease: Secondary | ICD-10-CM | POA: Diagnosis not present

## 2022-03-24 DIAGNOSIS — R509 Fever, unspecified: Secondary | ICD-10-CM | POA: Diagnosis not present

## 2022-03-24 DIAGNOSIS — Z992 Dependence on renal dialysis: Secondary | ICD-10-CM | POA: Diagnosis not present

## 2022-03-26 DIAGNOSIS — N189 Chronic kidney disease, unspecified: Secondary | ICD-10-CM | POA: Diagnosis not present

## 2022-03-26 DIAGNOSIS — N2581 Secondary hyperparathyroidism of renal origin: Secondary | ICD-10-CM | POA: Diagnosis not present

## 2022-03-26 DIAGNOSIS — Z992 Dependence on renal dialysis: Secondary | ICD-10-CM | POA: Diagnosis not present

## 2022-03-26 DIAGNOSIS — N186 End stage renal disease: Secondary | ICD-10-CM | POA: Diagnosis not present

## 2022-03-26 DIAGNOSIS — R509 Fever, unspecified: Secondary | ICD-10-CM | POA: Diagnosis not present

## 2022-03-28 DIAGNOSIS — Z992 Dependence on renal dialysis: Secondary | ICD-10-CM | POA: Diagnosis not present

## 2022-03-28 DIAGNOSIS — N189 Chronic kidney disease, unspecified: Secondary | ICD-10-CM | POA: Diagnosis not present

## 2022-03-28 DIAGNOSIS — R509 Fever, unspecified: Secondary | ICD-10-CM | POA: Diagnosis not present

## 2022-03-28 DIAGNOSIS — N186 End stage renal disease: Secondary | ICD-10-CM | POA: Diagnosis not present

## 2022-03-28 DIAGNOSIS — N2581 Secondary hyperparathyroidism of renal origin: Secondary | ICD-10-CM | POA: Diagnosis not present

## 2022-04-04 DIAGNOSIS — N2581 Secondary hyperparathyroidism of renal origin: Secondary | ICD-10-CM | POA: Diagnosis not present

## 2022-04-04 DIAGNOSIS — N189 Chronic kidney disease, unspecified: Secondary | ICD-10-CM | POA: Diagnosis not present

## 2022-04-04 DIAGNOSIS — Z992 Dependence on renal dialysis: Secondary | ICD-10-CM | POA: Diagnosis not present

## 2022-04-04 DIAGNOSIS — N186 End stage renal disease: Secondary | ICD-10-CM | POA: Diagnosis not present

## 2022-04-04 DIAGNOSIS — R509 Fever, unspecified: Secondary | ICD-10-CM | POA: Diagnosis not present

## 2022-04-07 DIAGNOSIS — Z992 Dependence on renal dialysis: Secondary | ICD-10-CM | POA: Diagnosis not present

## 2022-04-07 DIAGNOSIS — N186 End stage renal disease: Secondary | ICD-10-CM | POA: Diagnosis not present

## 2022-04-07 DIAGNOSIS — I120 Hypertensive chronic kidney disease with stage 5 chronic kidney disease or end stage renal disease: Secondary | ICD-10-CM | POA: Diagnosis not present

## 2022-04-09 DIAGNOSIS — N186 End stage renal disease: Secondary | ICD-10-CM | POA: Diagnosis not present

## 2022-04-09 DIAGNOSIS — Z992 Dependence on renal dialysis: Secondary | ICD-10-CM | POA: Diagnosis not present

## 2022-04-09 DIAGNOSIS — N2581 Secondary hyperparathyroidism of renal origin: Secondary | ICD-10-CM | POA: Diagnosis not present

## 2022-04-12 DIAGNOSIS — N2581 Secondary hyperparathyroidism of renal origin: Secondary | ICD-10-CM | POA: Diagnosis not present

## 2022-04-12 DIAGNOSIS — N186 End stage renal disease: Secondary | ICD-10-CM | POA: Diagnosis not present

## 2022-04-12 DIAGNOSIS — Z992 Dependence on renal dialysis: Secondary | ICD-10-CM | POA: Diagnosis not present

## 2022-04-16 DIAGNOSIS — Z992 Dependence on renal dialysis: Secondary | ICD-10-CM | POA: Diagnosis not present

## 2022-04-16 DIAGNOSIS — N186 End stage renal disease: Secondary | ICD-10-CM | POA: Diagnosis not present

## 2022-04-16 DIAGNOSIS — N2581 Secondary hyperparathyroidism of renal origin: Secondary | ICD-10-CM | POA: Diagnosis not present

## 2022-04-18 DIAGNOSIS — N186 End stage renal disease: Secondary | ICD-10-CM | POA: Diagnosis not present

## 2022-04-18 DIAGNOSIS — Z992 Dependence on renal dialysis: Secondary | ICD-10-CM | POA: Diagnosis not present

## 2022-04-18 DIAGNOSIS — N2581 Secondary hyperparathyroidism of renal origin: Secondary | ICD-10-CM | POA: Diagnosis not present

## 2022-04-24 DIAGNOSIS — N186 End stage renal disease: Secondary | ICD-10-CM | POA: Diagnosis not present

## 2022-04-24 DIAGNOSIS — N2581 Secondary hyperparathyroidism of renal origin: Secondary | ICD-10-CM | POA: Diagnosis not present

## 2022-04-24 DIAGNOSIS — Z992 Dependence on renal dialysis: Secondary | ICD-10-CM | POA: Diagnosis not present

## 2022-04-26 DIAGNOSIS — N2581 Secondary hyperparathyroidism of renal origin: Secondary | ICD-10-CM | POA: Diagnosis not present

## 2022-04-26 DIAGNOSIS — N186 End stage renal disease: Secondary | ICD-10-CM | POA: Diagnosis not present

## 2022-04-26 DIAGNOSIS — Z992 Dependence on renal dialysis: Secondary | ICD-10-CM | POA: Diagnosis not present

## 2022-04-30 DIAGNOSIS — N186 End stage renal disease: Secondary | ICD-10-CM | POA: Diagnosis not present

## 2022-04-30 DIAGNOSIS — Z992 Dependence on renal dialysis: Secondary | ICD-10-CM | POA: Diagnosis not present

## 2022-04-30 DIAGNOSIS — N2581 Secondary hyperparathyroidism of renal origin: Secondary | ICD-10-CM | POA: Diagnosis not present

## 2022-05-02 DIAGNOSIS — N2581 Secondary hyperparathyroidism of renal origin: Secondary | ICD-10-CM | POA: Diagnosis not present

## 2022-05-02 DIAGNOSIS — N186 End stage renal disease: Secondary | ICD-10-CM | POA: Diagnosis not present

## 2022-05-02 DIAGNOSIS — Z992 Dependence on renal dialysis: Secondary | ICD-10-CM | POA: Diagnosis not present

## 2022-05-05 DIAGNOSIS — N186 End stage renal disease: Secondary | ICD-10-CM | POA: Diagnosis not present

## 2022-05-05 DIAGNOSIS — Z992 Dependence on renal dialysis: Secondary | ICD-10-CM | POA: Diagnosis not present

## 2022-05-05 DIAGNOSIS — N2581 Secondary hyperparathyroidism of renal origin: Secondary | ICD-10-CM | POA: Diagnosis not present

## 2022-05-08 DIAGNOSIS — Z992 Dependence on renal dialysis: Secondary | ICD-10-CM | POA: Diagnosis not present

## 2022-05-08 DIAGNOSIS — N186 End stage renal disease: Secondary | ICD-10-CM | POA: Diagnosis not present

## 2022-05-08 DIAGNOSIS — I120 Hypertensive chronic kidney disease with stage 5 chronic kidney disease or end stage renal disease: Secondary | ICD-10-CM | POA: Diagnosis not present

## 2022-05-09 DIAGNOSIS — N2581 Secondary hyperparathyroidism of renal origin: Secondary | ICD-10-CM | POA: Diagnosis not present

## 2022-05-09 DIAGNOSIS — Z992 Dependence on renal dialysis: Secondary | ICD-10-CM | POA: Diagnosis not present

## 2022-05-09 DIAGNOSIS — N186 End stage renal disease: Secondary | ICD-10-CM | POA: Diagnosis not present

## 2022-05-09 DIAGNOSIS — R519 Headache, unspecified: Secondary | ICD-10-CM | POA: Diagnosis not present

## 2022-05-12 DIAGNOSIS — N2581 Secondary hyperparathyroidism of renal origin: Secondary | ICD-10-CM | POA: Diagnosis not present

## 2022-05-12 DIAGNOSIS — R519 Headache, unspecified: Secondary | ICD-10-CM | POA: Diagnosis not present

## 2022-05-12 DIAGNOSIS — N186 End stage renal disease: Secondary | ICD-10-CM | POA: Diagnosis not present

## 2022-05-12 DIAGNOSIS — Z992 Dependence on renal dialysis: Secondary | ICD-10-CM | POA: Diagnosis not present

## 2022-05-14 DIAGNOSIS — N2581 Secondary hyperparathyroidism of renal origin: Secondary | ICD-10-CM | POA: Diagnosis not present

## 2022-05-14 DIAGNOSIS — R519 Headache, unspecified: Secondary | ICD-10-CM | POA: Diagnosis not present

## 2022-05-14 DIAGNOSIS — N186 End stage renal disease: Secondary | ICD-10-CM | POA: Diagnosis not present

## 2022-05-14 DIAGNOSIS — Z992 Dependence on renal dialysis: Secondary | ICD-10-CM | POA: Diagnosis not present

## 2022-05-19 DIAGNOSIS — R519 Headache, unspecified: Secondary | ICD-10-CM | POA: Diagnosis not present

## 2022-05-19 DIAGNOSIS — Z992 Dependence on renal dialysis: Secondary | ICD-10-CM | POA: Diagnosis not present

## 2022-05-19 DIAGNOSIS — N186 End stage renal disease: Secondary | ICD-10-CM | POA: Diagnosis not present

## 2022-05-19 DIAGNOSIS — N2581 Secondary hyperparathyroidism of renal origin: Secondary | ICD-10-CM | POA: Diagnosis not present

## 2022-05-23 DIAGNOSIS — N186 End stage renal disease: Secondary | ICD-10-CM | POA: Diagnosis not present

## 2022-05-23 DIAGNOSIS — R519 Headache, unspecified: Secondary | ICD-10-CM | POA: Diagnosis not present

## 2022-05-23 DIAGNOSIS — N2581 Secondary hyperparathyroidism of renal origin: Secondary | ICD-10-CM | POA: Diagnosis not present

## 2022-05-23 DIAGNOSIS — Z992 Dependence on renal dialysis: Secondary | ICD-10-CM | POA: Diagnosis not present

## 2022-05-26 DIAGNOSIS — N2581 Secondary hyperparathyroidism of renal origin: Secondary | ICD-10-CM | POA: Diagnosis not present

## 2022-05-26 DIAGNOSIS — Z992 Dependence on renal dialysis: Secondary | ICD-10-CM | POA: Diagnosis not present

## 2022-05-26 DIAGNOSIS — N186 End stage renal disease: Secondary | ICD-10-CM | POA: Diagnosis not present

## 2022-05-26 DIAGNOSIS — R519 Headache, unspecified: Secondary | ICD-10-CM | POA: Diagnosis not present

## 2022-05-30 DIAGNOSIS — Z992 Dependence on renal dialysis: Secondary | ICD-10-CM | POA: Diagnosis not present

## 2022-05-30 DIAGNOSIS — N186 End stage renal disease: Secondary | ICD-10-CM | POA: Diagnosis not present

## 2022-05-30 DIAGNOSIS — R519 Headache, unspecified: Secondary | ICD-10-CM | POA: Diagnosis not present

## 2022-05-30 DIAGNOSIS — N2581 Secondary hyperparathyroidism of renal origin: Secondary | ICD-10-CM | POA: Diagnosis not present

## 2022-06-04 DIAGNOSIS — R519 Headache, unspecified: Secondary | ICD-10-CM | POA: Diagnosis not present

## 2022-06-04 DIAGNOSIS — N2581 Secondary hyperparathyroidism of renal origin: Secondary | ICD-10-CM | POA: Diagnosis not present

## 2022-06-04 DIAGNOSIS — N186 End stage renal disease: Secondary | ICD-10-CM | POA: Diagnosis not present

## 2022-06-04 DIAGNOSIS — Z992 Dependence on renal dialysis: Secondary | ICD-10-CM | POA: Diagnosis not present

## 2022-06-05 DIAGNOSIS — N186 End stage renal disease: Secondary | ICD-10-CM | POA: Diagnosis not present

## 2022-06-05 DIAGNOSIS — R519 Headache, unspecified: Secondary | ICD-10-CM | POA: Diagnosis not present

## 2022-06-05 DIAGNOSIS — N2581 Secondary hyperparathyroidism of renal origin: Secondary | ICD-10-CM | POA: Diagnosis not present

## 2022-06-05 DIAGNOSIS — Z992 Dependence on renal dialysis: Secondary | ICD-10-CM | POA: Diagnosis not present

## 2022-06-06 DIAGNOSIS — N2581 Secondary hyperparathyroidism of renal origin: Secondary | ICD-10-CM | POA: Diagnosis not present

## 2022-06-06 DIAGNOSIS — N186 End stage renal disease: Secondary | ICD-10-CM | POA: Diagnosis not present

## 2022-06-06 DIAGNOSIS — Z992 Dependence on renal dialysis: Secondary | ICD-10-CM | POA: Diagnosis not present

## 2022-06-06 DIAGNOSIS — I120 Hypertensive chronic kidney disease with stage 5 chronic kidney disease or end stage renal disease: Secondary | ICD-10-CM | POA: Diagnosis not present

## 2022-06-10 DIAGNOSIS — D539 Nutritional anemia, unspecified: Secondary | ICD-10-CM | POA: Diagnosis not present

## 2022-06-10 DIAGNOSIS — E1110 Type 2 diabetes mellitus with ketoacidosis without coma: Secondary | ICD-10-CM | POA: Diagnosis not present

## 2022-06-10 DIAGNOSIS — R9431 Abnormal electrocardiogram [ECG] [EKG]: Secondary | ICD-10-CM | POA: Diagnosis not present

## 2022-06-10 DIAGNOSIS — Z7982 Long term (current) use of aspirin: Secondary | ICD-10-CM | POA: Diagnosis not present

## 2022-06-10 DIAGNOSIS — F17200 Nicotine dependence, unspecified, uncomplicated: Secondary | ICD-10-CM | POA: Diagnosis not present

## 2022-06-10 DIAGNOSIS — Z992 Dependence on renal dialysis: Secondary | ICD-10-CM | POA: Diagnosis not present

## 2022-06-10 DIAGNOSIS — Z940 Kidney transplant status: Secondary | ICD-10-CM | POA: Diagnosis not present

## 2022-06-10 DIAGNOSIS — Z79899 Other long term (current) drug therapy: Secondary | ICD-10-CM | POA: Diagnosis not present

## 2022-06-10 DIAGNOSIS — I4891 Unspecified atrial fibrillation: Secondary | ICD-10-CM | POA: Diagnosis not present

## 2022-06-10 DIAGNOSIS — E875 Hyperkalemia: Secondary | ICD-10-CM | POA: Diagnosis not present

## 2022-06-10 DIAGNOSIS — Z888 Allergy status to other drugs, medicaments and biological substances status: Secondary | ICD-10-CM | POA: Diagnosis not present

## 2022-06-10 DIAGNOSIS — Z885 Allergy status to narcotic agent status: Secondary | ICD-10-CM | POA: Diagnosis not present

## 2022-06-10 DIAGNOSIS — D631 Anemia in chronic kidney disease: Secondary | ICD-10-CM | POA: Diagnosis not present

## 2022-06-10 DIAGNOSIS — I10 Essential (primary) hypertension: Secondary | ICD-10-CM | POA: Diagnosis not present

## 2022-06-10 DIAGNOSIS — K645 Perianal venous thrombosis: Secondary | ICD-10-CM | POA: Diagnosis not present

## 2022-06-10 DIAGNOSIS — N186 End stage renal disease: Secondary | ICD-10-CM | POA: Diagnosis not present

## 2022-06-10 DIAGNOSIS — D84821 Immunodeficiency due to drugs: Secondary | ICD-10-CM | POA: Diagnosis not present

## 2022-06-10 DIAGNOSIS — I120 Hypertensive chronic kidney disease with stage 5 chronic kidney disease or end stage renal disease: Secondary | ICD-10-CM | POA: Diagnosis not present

## 2022-06-10 DIAGNOSIS — E1122 Type 2 diabetes mellitus with diabetic chronic kidney disease: Secondary | ICD-10-CM | POA: Diagnosis not present

## 2022-06-10 DIAGNOSIS — K611 Rectal abscess: Secondary | ICD-10-CM | POA: Diagnosis not present

## 2022-06-10 DIAGNOSIS — K612 Anorectal abscess: Secondary | ICD-10-CM | POA: Diagnosis not present

## 2022-06-10 DIAGNOSIS — Z79621 Long term (current) use of calcineurin inhibitor: Secondary | ICD-10-CM | POA: Diagnosis not present

## 2022-06-10 DIAGNOSIS — K610 Anal abscess: Secondary | ICD-10-CM | POA: Diagnosis not present

## 2022-06-16 ENCOUNTER — Telehealth: Payer: Self-pay

## 2022-06-16 DIAGNOSIS — Z992 Dependence on renal dialysis: Secondary | ICD-10-CM | POA: Diagnosis not present

## 2022-06-16 DIAGNOSIS — N186 End stage renal disease: Secondary | ICD-10-CM | POA: Diagnosis not present

## 2022-06-16 DIAGNOSIS — N2581 Secondary hyperparathyroidism of renal origin: Secondary | ICD-10-CM | POA: Diagnosis not present

## 2022-06-16 NOTE — Transitions of Care (Post Inpatient/ED Visit) (Signed)
   06/16/2022  Name: Cory Dixon MRN: [id number] DOB: 12/06/1985  Today's TOC FU Call Status: Today's TOC FU Call Status:: Unsuccessul Call (1st Attempt) Unsuccessful Call (1st Attempt) Date: 06/16/22  Attempted to reach the patient regarding the most recent Inpatient/ED visit.  Follow Up Plan: Additional outreach attempts will be made to reach the patient to complete the Transitions of Care (Post Inpatient/ED visit) call.     Enzo Montgomery, RN,BSN,CCM [facility] Care Management Care Management Community Coordinator Direct Phone: [phone] Toll Free: [phone] Fax: [phone]

## 2022-06-17 ENCOUNTER — Telehealth: Payer: Self-pay

## 2022-06-17 NOTE — Transitions of Care (Post Inpatient/ED Visit) (Signed)
   06/17/2022  Name: Trampus Mcquerry MRN: [id number] DOB: 1985/10/02  Today's TOC FU Call Status: Today's TOC FU Call Status:: Unsuccessful Call (3rd Attempt) Unsuccessful Call (3rd Attempt) Date: 06/17/22  Attempted to reach the patient regarding the most recent Inpatient/ED visit.  Follow Up Plan: No further outreach attempts will be made at this time. We have been unable to contact the patient.   Enzo Montgomery, RN,BSN,CCM [facility] Care Management Care Management Community Coordinator Direct Phone: [phone] Toll Free: [phone] Fax: [phone]

## 2022-06-17 NOTE — Transitions of Care (Post Inpatient/ED Visit) (Signed)
   06/17/2022  Name: Tayt Moyers MRN: [id number] DOB: 26-Apr-1985  Today's TOC FU Call Status: Today's TOC FU Call Status:: Unsuccessful Call (2nd Attempt) Unsuccessful Call (2nd Attempt) Date: 06/17/22  Attempted to reach the patient regarding the most recent Inpatient/ED visit.  Follow Up Plan: Additional outreach attempts will be made to reach the patient to complete the Transitions of Care (Post Inpatient/ED visit) call.     Enzo Montgomery, RN,BSN,CCM [facility] Care Management Care Management Community Coordinator Direct Phone: [phone] Toll Free: [phone] Fax: [phone]

## 2022-06-18 DIAGNOSIS — N2581 Secondary hyperparathyroidism of renal origin: Secondary | ICD-10-CM | POA: Diagnosis not present

## 2022-06-18 DIAGNOSIS — N186 End stage renal disease: Secondary | ICD-10-CM | POA: Diagnosis not present

## 2022-06-18 DIAGNOSIS — Z992 Dependence on renal dialysis: Secondary | ICD-10-CM | POA: Diagnosis not present

## 2022-06-23 DIAGNOSIS — Z992 Dependence on renal dialysis: Secondary | ICD-10-CM | POA: Diagnosis not present

## 2022-06-23 DIAGNOSIS — N186 End stage renal disease: Secondary | ICD-10-CM | POA: Diagnosis not present

## 2022-06-23 DIAGNOSIS — N2581 Secondary hyperparathyroidism of renal origin: Secondary | ICD-10-CM | POA: Diagnosis not present

## 2022-06-25 DIAGNOSIS — N186 End stage renal disease: Secondary | ICD-10-CM | POA: Diagnosis not present

## 2022-06-25 DIAGNOSIS — N2581 Secondary hyperparathyroidism of renal origin: Secondary | ICD-10-CM | POA: Diagnosis not present

## 2022-06-25 DIAGNOSIS — Z992 Dependence on renal dialysis: Secondary | ICD-10-CM | POA: Diagnosis not present

## 2022-06-28 DIAGNOSIS — Z79899 Other long term (current) drug therapy: Secondary | ICD-10-CM | POA: Diagnosis not present

## 2022-06-28 DIAGNOSIS — F17200 Nicotine dependence, unspecified, uncomplicated: Secondary | ICD-10-CM | POA: Diagnosis not present

## 2022-06-28 DIAGNOSIS — D631 Anemia in chronic kidney disease: Secondary | ICD-10-CM | POA: Diagnosis not present

## 2022-06-28 DIAGNOSIS — T8612 Kidney transplant failure: Secondary | ICD-10-CM | POA: Diagnosis not present

## 2022-06-28 DIAGNOSIS — K644 Residual hemorrhoidal skin tags: Secondary | ICD-10-CM | POA: Diagnosis not present

## 2022-06-28 DIAGNOSIS — Z940 Kidney transplant status: Secondary | ICD-10-CM | POA: Diagnosis not present

## 2022-06-28 DIAGNOSIS — I10 Essential (primary) hypertension: Secondary | ICD-10-CM | POA: Diagnosis not present

## 2022-06-28 DIAGNOSIS — Z992 Dependence on renal dialysis: Secondary | ICD-10-CM | POA: Diagnosis not present

## 2022-06-28 DIAGNOSIS — I120 Hypertensive chronic kidney disease with stage 5 chronic kidney disease or end stage renal disease: Secondary | ICD-10-CM | POA: Diagnosis not present

## 2022-06-28 DIAGNOSIS — E875 Hyperkalemia: Secondary | ICD-10-CM | POA: Diagnosis not present

## 2022-06-28 DIAGNOSIS — K611 Rectal abscess: Secondary | ICD-10-CM | POA: Diagnosis not present

## 2022-06-28 DIAGNOSIS — D539 Nutritional anemia, unspecified: Secondary | ICD-10-CM | POA: Diagnosis not present

## 2022-06-28 DIAGNOSIS — N186 End stage renal disease: Secondary | ICD-10-CM | POA: Diagnosis not present

## 2022-07-04 DIAGNOSIS — N186 End stage renal disease: Secondary | ICD-10-CM | POA: Diagnosis not present

## 2022-07-04 DIAGNOSIS — N2581 Secondary hyperparathyroidism of renal origin: Secondary | ICD-10-CM | POA: Diagnosis not present

## 2022-07-04 DIAGNOSIS — Z992 Dependence on renal dialysis: Secondary | ICD-10-CM | POA: Diagnosis not present

## 2022-07-07 DIAGNOSIS — I120 Hypertensive chronic kidney disease with stage 5 chronic kidney disease or end stage renal disease: Secondary | ICD-10-CM | POA: Diagnosis not present

## 2022-07-07 DIAGNOSIS — N186 End stage renal disease: Secondary | ICD-10-CM | POA: Diagnosis not present

## 2022-07-07 DIAGNOSIS — N2581 Secondary hyperparathyroidism of renal origin: Secondary | ICD-10-CM | POA: Diagnosis not present

## 2022-07-07 DIAGNOSIS — Z992 Dependence on renal dialysis: Secondary | ICD-10-CM | POA: Diagnosis not present

## 2022-07-10 DIAGNOSIS — K611 Rectal abscess: Secondary | ICD-10-CM | POA: Diagnosis not present

## 2022-07-11 DIAGNOSIS — N186 End stage renal disease: Secondary | ICD-10-CM | POA: Diagnosis not present

## 2022-07-11 DIAGNOSIS — Z992 Dependence on renal dialysis: Secondary | ICD-10-CM | POA: Diagnosis not present

## 2022-07-11 DIAGNOSIS — N2581 Secondary hyperparathyroidism of renal origin: Secondary | ICD-10-CM | POA: Diagnosis not present

## 2022-07-18 DIAGNOSIS — N2581 Secondary hyperparathyroidism of renal origin: Secondary | ICD-10-CM | POA: Diagnosis not present

## 2022-07-18 DIAGNOSIS — N186 End stage renal disease: Secondary | ICD-10-CM | POA: Diagnosis not present

## 2022-07-18 DIAGNOSIS — Z992 Dependence on renal dialysis: Secondary | ICD-10-CM | POA: Diagnosis not present

## 2022-07-21 DIAGNOSIS — Z992 Dependence on renal dialysis: Secondary | ICD-10-CM | POA: Diagnosis not present

## 2022-07-21 DIAGNOSIS — N186 End stage renal disease: Secondary | ICD-10-CM | POA: Diagnosis not present

## 2022-07-21 DIAGNOSIS — N2581 Secondary hyperparathyroidism of renal origin: Secondary | ICD-10-CM | POA: Diagnosis not present

## 2022-07-23 DIAGNOSIS — N2581 Secondary hyperparathyroidism of renal origin: Secondary | ICD-10-CM | POA: Diagnosis not present

## 2022-07-23 DIAGNOSIS — Z992 Dependence on renal dialysis: Secondary | ICD-10-CM | POA: Diagnosis not present

## 2022-07-23 DIAGNOSIS — N186 End stage renal disease: Secondary | ICD-10-CM | POA: Diagnosis not present

## 2022-07-25 DIAGNOSIS — N186 End stage renal disease: Secondary | ICD-10-CM | POA: Diagnosis not present

## 2022-07-25 DIAGNOSIS — N2581 Secondary hyperparathyroidism of renal origin: Secondary | ICD-10-CM | POA: Diagnosis not present

## 2022-07-25 DIAGNOSIS — Z992 Dependence on renal dialysis: Secondary | ICD-10-CM | POA: Diagnosis not present

## 2022-07-29 DIAGNOSIS — N2581 Secondary hyperparathyroidism of renal origin: Secondary | ICD-10-CM | POA: Diagnosis not present

## 2022-07-29 DIAGNOSIS — N186 End stage renal disease: Secondary | ICD-10-CM | POA: Diagnosis not present

## 2022-07-29 DIAGNOSIS — Z992 Dependence on renal dialysis: Secondary | ICD-10-CM | POA: Diagnosis not present

## 2022-07-30 DIAGNOSIS — N2581 Secondary hyperparathyroidism of renal origin: Secondary | ICD-10-CM | POA: Diagnosis not present

## 2022-07-30 DIAGNOSIS — N186 End stage renal disease: Secondary | ICD-10-CM | POA: Diagnosis not present

## 2022-07-30 DIAGNOSIS — Z992 Dependence on renal dialysis: Secondary | ICD-10-CM | POA: Diagnosis not present

## 2022-08-01 DIAGNOSIS — N186 End stage renal disease: Secondary | ICD-10-CM | POA: Diagnosis not present

## 2022-08-01 DIAGNOSIS — N2581 Secondary hyperparathyroidism of renal origin: Secondary | ICD-10-CM | POA: Diagnosis not present

## 2022-08-01 DIAGNOSIS — Z992 Dependence on renal dialysis: Secondary | ICD-10-CM | POA: Diagnosis not present

## 2022-08-04 DIAGNOSIS — Z992 Dependence on renal dialysis: Secondary | ICD-10-CM | POA: Diagnosis not present

## 2022-08-04 DIAGNOSIS — N2581 Secondary hyperparathyroidism of renal origin: Secondary | ICD-10-CM | POA: Diagnosis not present

## 2022-08-04 DIAGNOSIS — N186 End stage renal disease: Secondary | ICD-10-CM | POA: Diagnosis not present

## 2022-08-06 DIAGNOSIS — D849 Immunodeficiency, unspecified: Secondary | ICD-10-CM | POA: Diagnosis not present

## 2022-08-06 DIAGNOSIS — I120 Hypertensive chronic kidney disease with stage 5 chronic kidney disease or end stage renal disease: Secondary | ICD-10-CM | POA: Diagnosis not present

## 2022-08-06 DIAGNOSIS — N186 End stage renal disease: Secondary | ICD-10-CM | POA: Diagnosis not present

## 2022-08-06 DIAGNOSIS — Z992 Dependence on renal dialysis: Secondary | ICD-10-CM | POA: Diagnosis not present

## 2022-08-06 DIAGNOSIS — I10 Essential (primary) hypertension: Secondary | ICD-10-CM | POA: Diagnosis not present

## 2022-08-08 DIAGNOSIS — Z992 Dependence on renal dialysis: Secondary | ICD-10-CM | POA: Diagnosis not present

## 2022-08-08 DIAGNOSIS — N2581 Secondary hyperparathyroidism of renal origin: Secondary | ICD-10-CM | POA: Diagnosis not present

## 2022-08-08 DIAGNOSIS — N186 End stage renal disease: Secondary | ICD-10-CM | POA: Diagnosis not present

## 2022-08-11 DIAGNOSIS — N2581 Secondary hyperparathyroidism of renal origin: Secondary | ICD-10-CM | POA: Diagnosis not present

## 2022-08-11 DIAGNOSIS — Z992 Dependence on renal dialysis: Secondary | ICD-10-CM | POA: Diagnosis not present

## 2022-08-11 DIAGNOSIS — N186 End stage renal disease: Secondary | ICD-10-CM | POA: Diagnosis not present

## 2022-08-13 DIAGNOSIS — N2581 Secondary hyperparathyroidism of renal origin: Secondary | ICD-10-CM | POA: Diagnosis not present

## 2022-08-13 DIAGNOSIS — N186 End stage renal disease: Secondary | ICD-10-CM | POA: Diagnosis not present

## 2022-08-13 DIAGNOSIS — Z992 Dependence on renal dialysis: Secondary | ICD-10-CM | POA: Diagnosis not present

## 2022-08-15 DIAGNOSIS — N2581 Secondary hyperparathyroidism of renal origin: Secondary | ICD-10-CM | POA: Diagnosis not present

## 2022-08-15 DIAGNOSIS — Z992 Dependence on renal dialysis: Secondary | ICD-10-CM | POA: Diagnosis not present

## 2022-08-15 DIAGNOSIS — N186 End stage renal disease: Secondary | ICD-10-CM | POA: Diagnosis not present

## 2022-08-20 DIAGNOSIS — N2581 Secondary hyperparathyroidism of renal origin: Secondary | ICD-10-CM | POA: Diagnosis not present

## 2022-08-20 DIAGNOSIS — N186 End stage renal disease: Secondary | ICD-10-CM | POA: Diagnosis not present

## 2022-08-20 DIAGNOSIS — Z992 Dependence on renal dialysis: Secondary | ICD-10-CM | POA: Diagnosis not present

## 2022-08-22 DIAGNOSIS — Z992 Dependence on renal dialysis: Secondary | ICD-10-CM | POA: Diagnosis not present

## 2022-08-22 DIAGNOSIS — N186 End stage renal disease: Secondary | ICD-10-CM | POA: Diagnosis not present

## 2022-08-22 DIAGNOSIS — N2581 Secondary hyperparathyroidism of renal origin: Secondary | ICD-10-CM | POA: Diagnosis not present

## 2022-08-25 DIAGNOSIS — N186 End stage renal disease: Secondary | ICD-10-CM | POA: Diagnosis not present

## 2022-08-25 DIAGNOSIS — Z992 Dependence on renal dialysis: Secondary | ICD-10-CM | POA: Diagnosis not present

## 2022-08-25 DIAGNOSIS — N2581 Secondary hyperparathyroidism of renal origin: Secondary | ICD-10-CM | POA: Diagnosis not present

## 2022-08-27 DIAGNOSIS — N2581 Secondary hyperparathyroidism of renal origin: Secondary | ICD-10-CM | POA: Diagnosis not present

## 2022-08-27 DIAGNOSIS — Z992 Dependence on renal dialysis: Secondary | ICD-10-CM | POA: Diagnosis not present

## 2022-08-27 DIAGNOSIS — N186 End stage renal disease: Secondary | ICD-10-CM | POA: Diagnosis not present

## 2022-08-27 IMAGING — DX DG CHEST 1V PORT
1 series · 1 of 1 positions shown · non-contrast
Comparison: Chest radiograph dated 03/19/2017.

CLINICAL DATA: Hyperkalemia.

EXAM:
PORTABLE CHEST 1 VIEW

[chest ap]
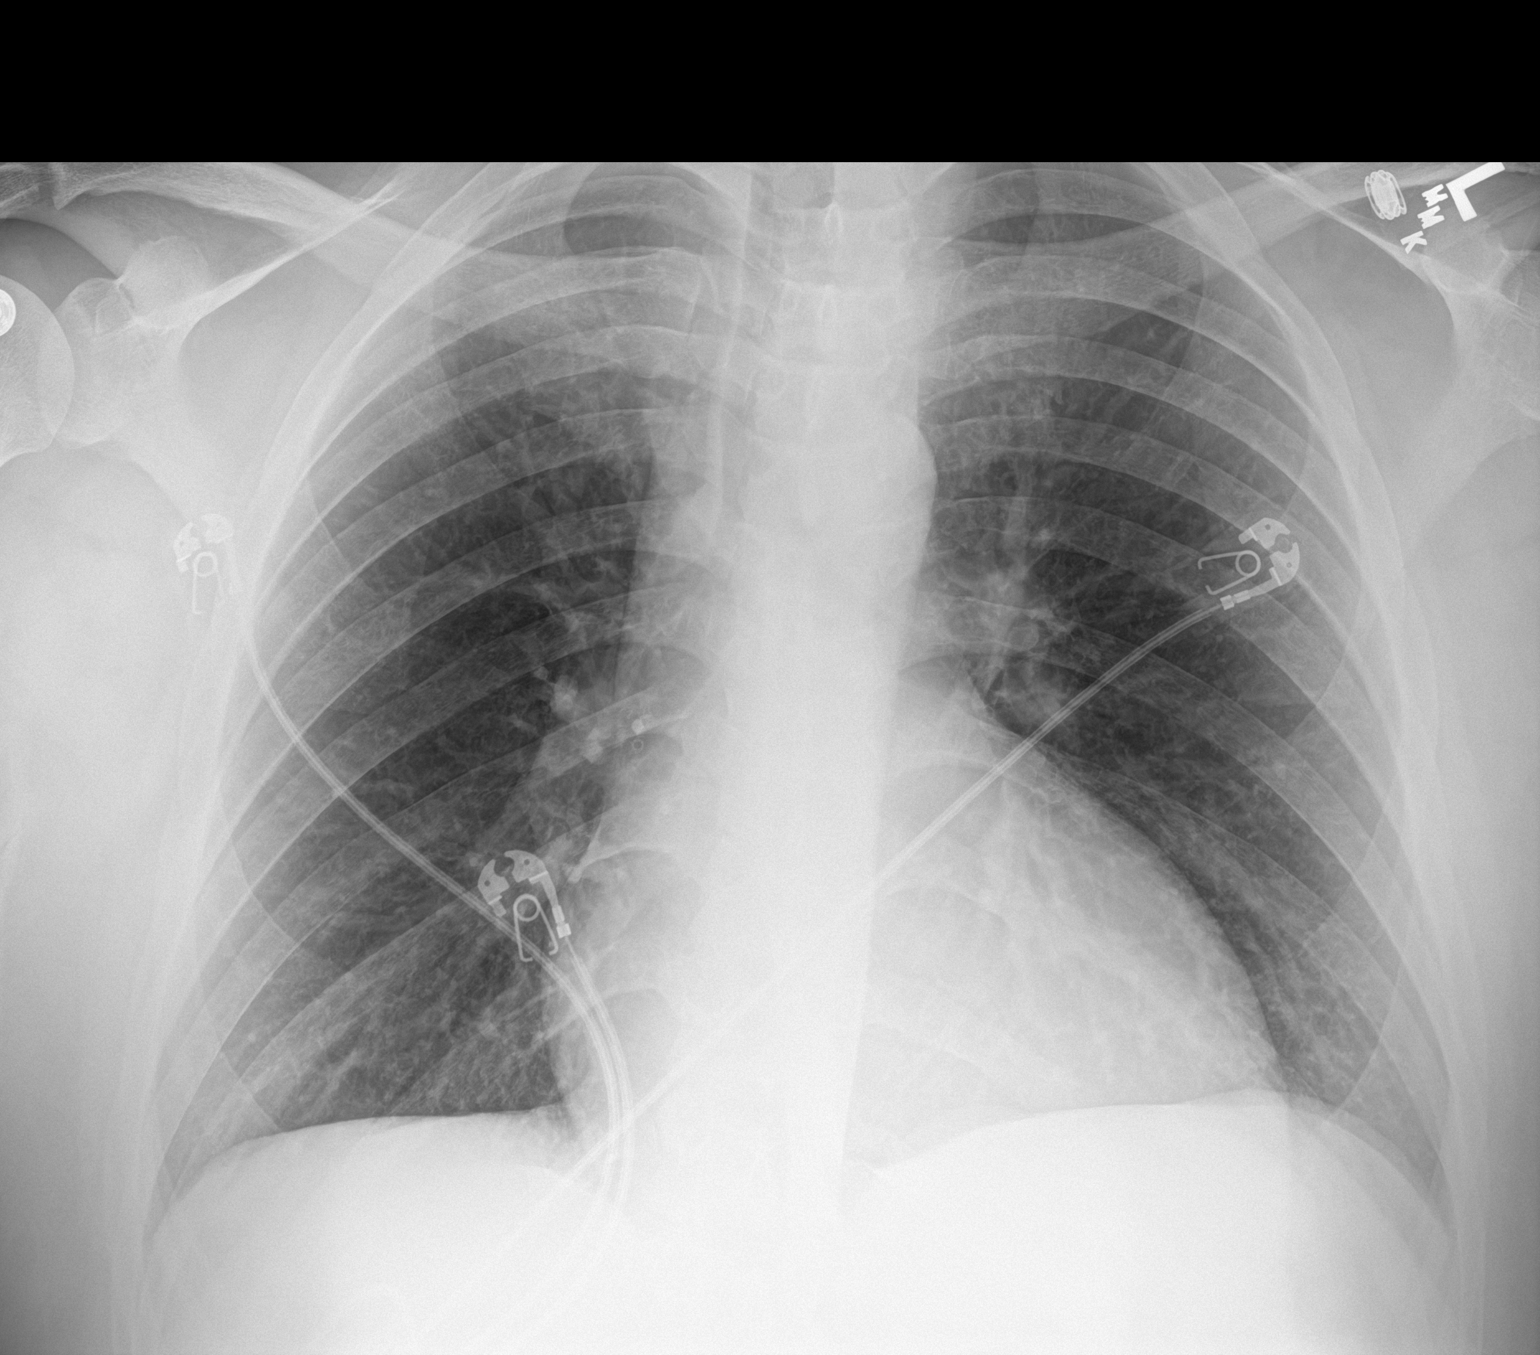

[1 of 1 positions shown; findings below may reference images not displayed]

FINDINGS: No focal consolidation, pleural effusion or pneumothorax. Borderline
cardiomegaly. No acute osseous pathology.
IMPRESSION: No acute cardiopulmonary process. Borderline cardiomegaly.

## 2022-09-03 DIAGNOSIS — N2581 Secondary hyperparathyroidism of renal origin: Secondary | ICD-10-CM | POA: Diagnosis not present

## 2022-09-03 DIAGNOSIS — N186 End stage renal disease: Secondary | ICD-10-CM | POA: Diagnosis not present

## 2022-09-03 DIAGNOSIS — Z992 Dependence on renal dialysis: Secondary | ICD-10-CM | POA: Diagnosis not present

## 2022-09-05 DIAGNOSIS — N186 End stage renal disease: Secondary | ICD-10-CM | POA: Diagnosis not present

## 2022-09-05 DIAGNOSIS — N2581 Secondary hyperparathyroidism of renal origin: Secondary | ICD-10-CM | POA: Diagnosis not present

## 2022-09-05 DIAGNOSIS — Z992 Dependence on renal dialysis: Secondary | ICD-10-CM | POA: Diagnosis not present

## 2022-09-08 DIAGNOSIS — N2581 Secondary hyperparathyroidism of renal origin: Secondary | ICD-10-CM | POA: Diagnosis not present

## 2022-09-08 DIAGNOSIS — Z992 Dependence on renal dialysis: Secondary | ICD-10-CM | POA: Diagnosis not present

## 2022-09-08 DIAGNOSIS — D509 Iron deficiency anemia, unspecified: Secondary | ICD-10-CM | POA: Diagnosis not present

## 2022-09-08 DIAGNOSIS — N186 End stage renal disease: Secondary | ICD-10-CM | POA: Diagnosis not present

## 2022-09-10 DIAGNOSIS — N2581 Secondary hyperparathyroidism of renal origin: Secondary | ICD-10-CM | POA: Diagnosis not present

## 2022-09-10 DIAGNOSIS — Z992 Dependence on renal dialysis: Secondary | ICD-10-CM | POA: Diagnosis not present

## 2022-09-10 DIAGNOSIS — N186 End stage renal disease: Secondary | ICD-10-CM | POA: Diagnosis not present

## 2022-09-10 DIAGNOSIS — D509 Iron deficiency anemia, unspecified: Secondary | ICD-10-CM | POA: Diagnosis not present

## 2022-09-19 DIAGNOSIS — D509 Iron deficiency anemia, unspecified: Secondary | ICD-10-CM | POA: Diagnosis not present

## 2022-09-19 DIAGNOSIS — Z992 Dependence on renal dialysis: Secondary | ICD-10-CM | POA: Diagnosis not present

## 2022-09-19 DIAGNOSIS — N186 End stage renal disease: Secondary | ICD-10-CM | POA: Diagnosis not present

## 2022-09-19 DIAGNOSIS — N2581 Secondary hyperparathyroidism of renal origin: Secondary | ICD-10-CM | POA: Diagnosis not present

## 2022-09-22 IMAGING — US US RENAL TRANSPLANT
2 series · 13 of 25 positions shown · non-contrast
Comparison: CT abdomen and pelvis-07/06/2019

CLINICAL DATA: Acute renal insufficiency. Right lower quadrant
renal transplant.

EXAM:
ULTRASOUND OF RENAL TRANSPLANT WITH RENAL DOPPLER ULTRASOUND
TECHNIQUE: Ultrasound examination of the renal transplant was performed with
gray-scale, color and duplex doppler evaluation.

[Series 1: us renal transplant w/doppler · 12 of 39 slices shown]
[im 1/39]
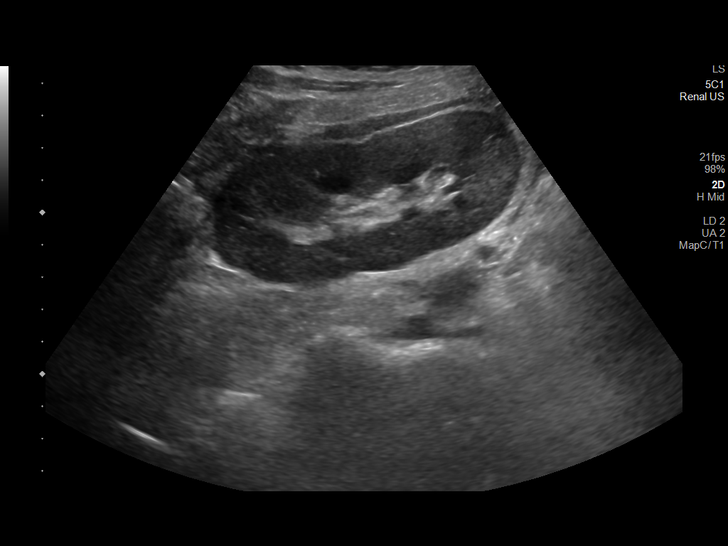
[im 4/39]
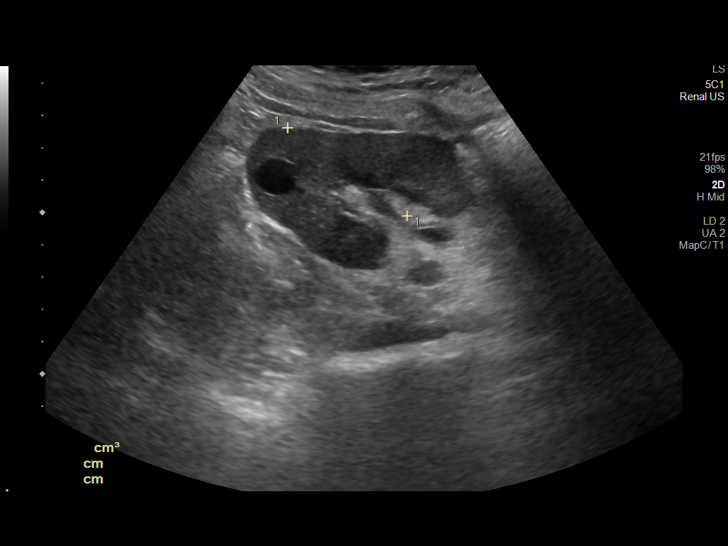
[im 7/39]
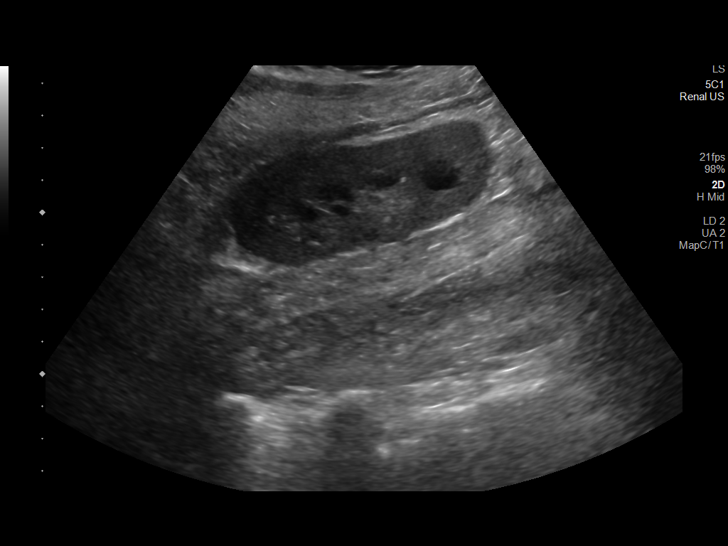
[im 10/39]
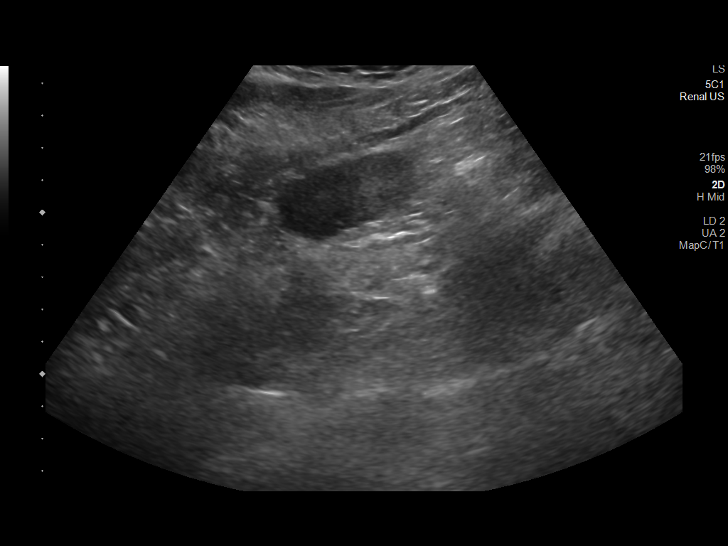
[im 14/39]
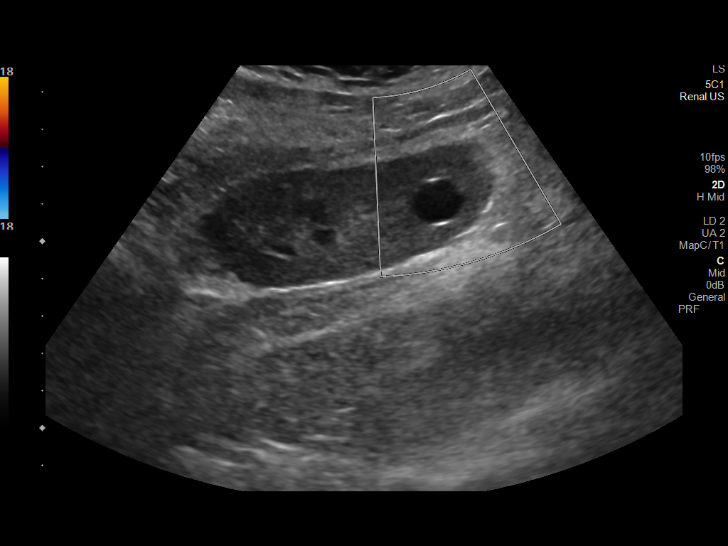
[im 17/39]
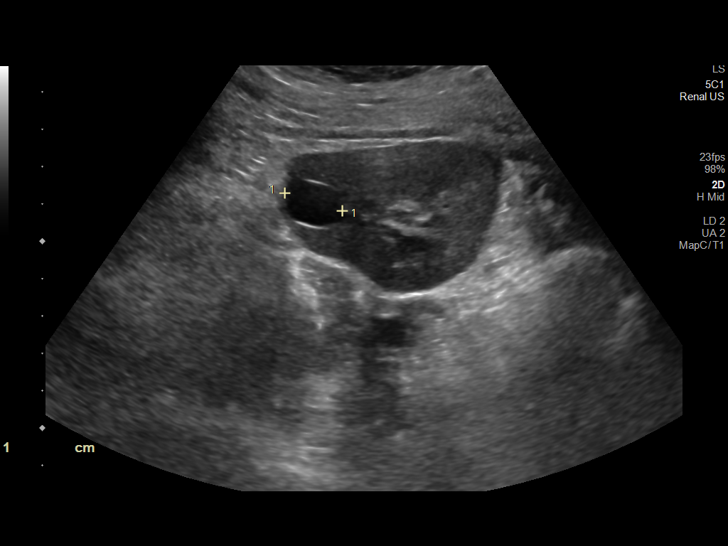
[im 20/39]
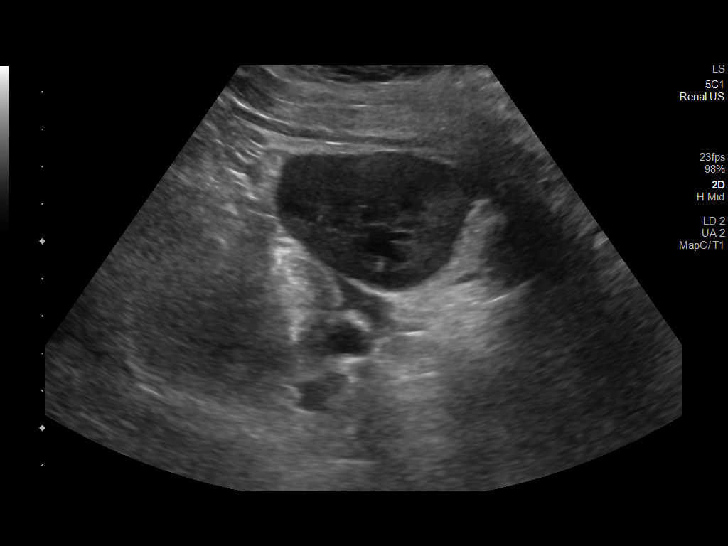
[im 24/39]
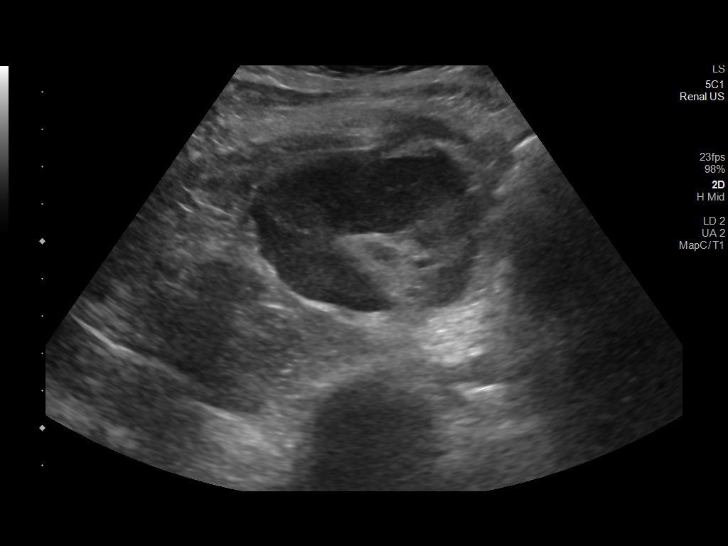
[im 27/39]
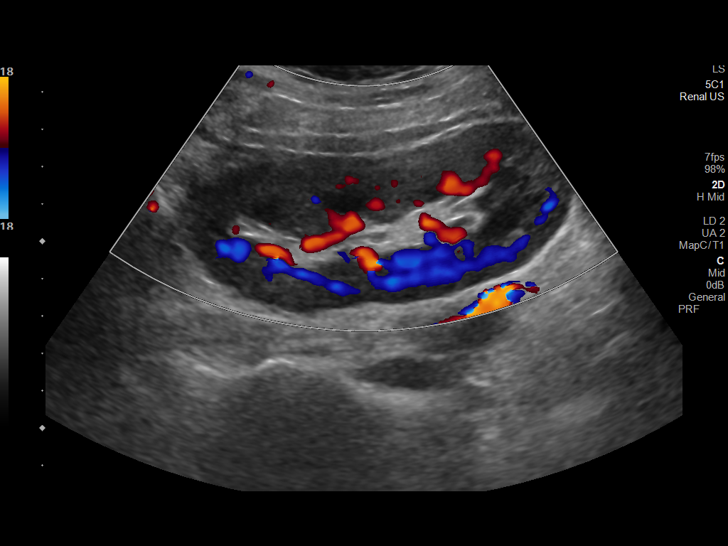
[im 30/39]
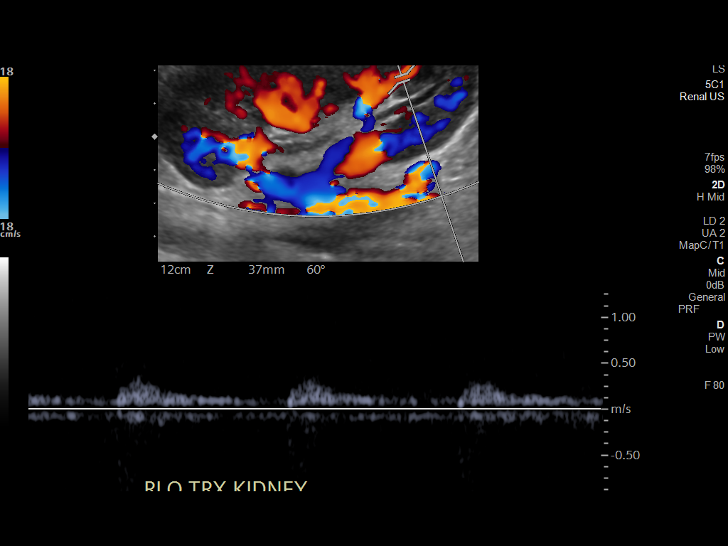
[im 34/39]
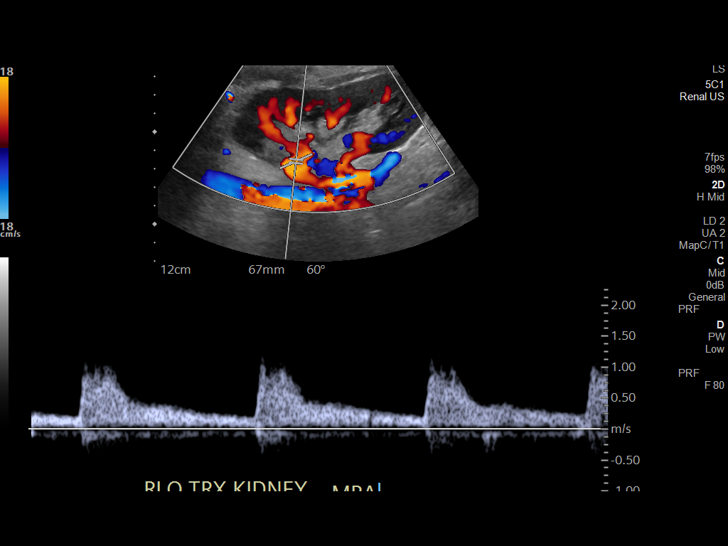
[im 37/39]
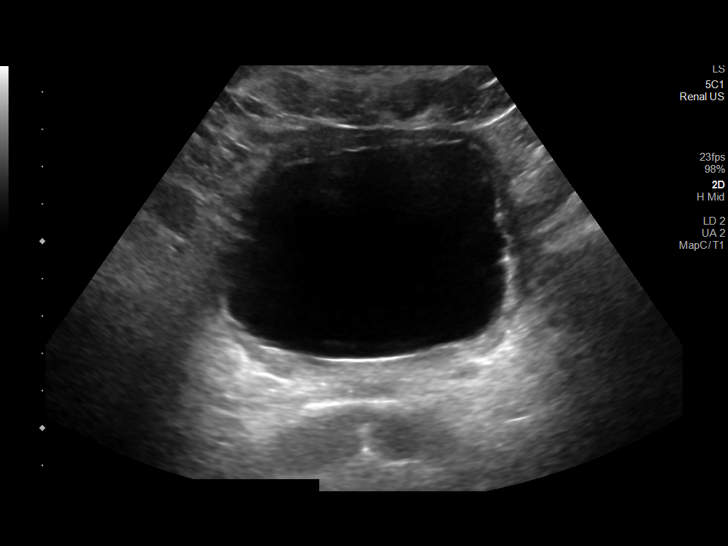

[Series 1001: renal us · 1 of 1 slices shown]
[im 1/1]
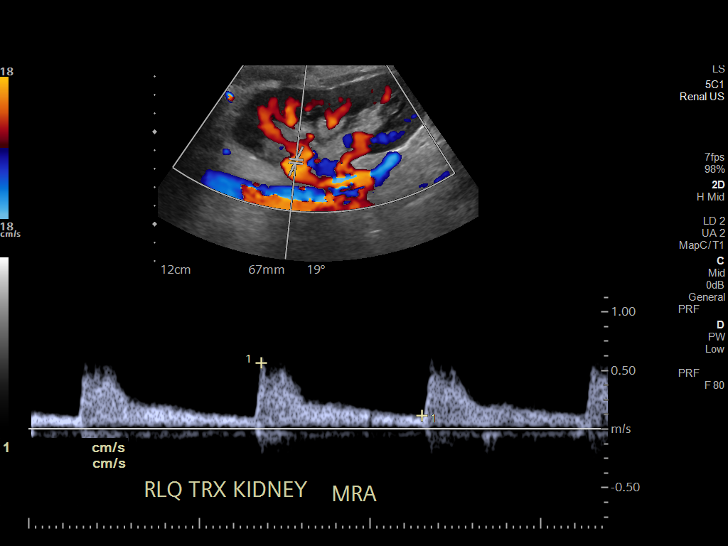

[13 of 25 positions shown; findings below may reference images not displayed]

FINDINGS: Transplant kidney location: RLQ

Transplant Kidney:

Renal measurements: 9.7 x 4.6 x 4.1 cm = volume: 95.2 mL. Normal in
size and parenchymal echogenicity. No evidence of urinary
obstruction. Note is made of an approximately 1.4 x 1.1 x 1.6 cm
anechoic cyst within the inferior pole the right kidney. No
echogenic renal stones. No peri-transplant fluid collection seen.

Color flow in the main renal artery:  Yes

Color flow in the main renal vein:  Yes

Duplex Doppler Evaluation:

Main Renal Artery Velocity: 56.5 cm/sec

Main Renal Artery Resistive Index:

Venous waveform in main renal vein:  Present

Intrarenal resistive index in upper pole:

(normal 0.6-0.8; equivocal 0.8-0.9; abnormal >= 0.9)

Intrarenal resistive index in lower pole:

(normal 0.6-0.8; equivocal 0.8-0.9; abnormal >= 0.9)

Bladder: Mild diffuse thickening of the urinary bladder wall,
potentially accentuated due to underdistention.

Other findings:  None.
IMPRESSION: 1. Normal appearance of the right lower quadrant renal transplant
without evidence of urinary obstruction and with normal resistive
indices.
2. Incidental note made of an approximately 1.6 cm cyst within the
inferior pole the right kidney.
3. Mild diffuse thickening the urinary bladder wall, potentially
accentuated due to underdistention though conceivably a cystitis
could have a similar appearance. Clinical correlation is advised.

## 2022-09-24 DIAGNOSIS — N186 End stage renal disease: Secondary | ICD-10-CM | POA: Diagnosis not present

## 2022-09-24 DIAGNOSIS — N2581 Secondary hyperparathyroidism of renal origin: Secondary | ICD-10-CM | POA: Diagnosis not present

## 2022-09-24 DIAGNOSIS — D509 Iron deficiency anemia, unspecified: Secondary | ICD-10-CM | POA: Diagnosis not present

## 2022-09-24 DIAGNOSIS — Z992 Dependence on renal dialysis: Secondary | ICD-10-CM | POA: Diagnosis not present

## 2022-09-27 DIAGNOSIS — D509 Iron deficiency anemia, unspecified: Secondary | ICD-10-CM | POA: Diagnosis not present

## 2022-09-27 DIAGNOSIS — N186 End stage renal disease: Secondary | ICD-10-CM | POA: Diagnosis not present

## 2022-09-27 DIAGNOSIS — Z992 Dependence on renal dialysis: Secondary | ICD-10-CM | POA: Diagnosis not present

## 2022-09-27 DIAGNOSIS — N2581 Secondary hyperparathyroidism of renal origin: Secondary | ICD-10-CM | POA: Diagnosis not present

## 2022-10-01 DIAGNOSIS — Z992 Dependence on renal dialysis: Secondary | ICD-10-CM | POA: Diagnosis not present

## 2022-10-01 DIAGNOSIS — N2581 Secondary hyperparathyroidism of renal origin: Secondary | ICD-10-CM | POA: Diagnosis not present

## 2022-10-01 DIAGNOSIS — D509 Iron deficiency anemia, unspecified: Secondary | ICD-10-CM | POA: Diagnosis not present

## 2022-10-01 DIAGNOSIS — N186 End stage renal disease: Secondary | ICD-10-CM | POA: Diagnosis not present

## 2022-10-03 DIAGNOSIS — Z992 Dependence on renal dialysis: Secondary | ICD-10-CM | POA: Diagnosis not present

## 2022-10-03 DIAGNOSIS — D509 Iron deficiency anemia, unspecified: Secondary | ICD-10-CM | POA: Diagnosis not present

## 2022-10-03 DIAGNOSIS — N186 End stage renal disease: Secondary | ICD-10-CM | POA: Diagnosis not present

## 2022-10-03 DIAGNOSIS — N2581 Secondary hyperparathyroidism of renal origin: Secondary | ICD-10-CM | POA: Diagnosis not present

## 2022-10-06 DIAGNOSIS — Z992 Dependence on renal dialysis: Secondary | ICD-10-CM | POA: Diagnosis not present

## 2022-10-06 DIAGNOSIS — N186 End stage renal disease: Secondary | ICD-10-CM | POA: Diagnosis not present

## 2022-10-06 DIAGNOSIS — I120 Hypertensive chronic kidney disease with stage 5 chronic kidney disease or end stage renal disease: Secondary | ICD-10-CM | POA: Diagnosis not present

## 2022-10-08 DIAGNOSIS — Z992 Dependence on renal dialysis: Secondary | ICD-10-CM | POA: Diagnosis not present

## 2022-10-08 DIAGNOSIS — N2581 Secondary hyperparathyroidism of renal origin: Secondary | ICD-10-CM | POA: Diagnosis not present

## 2022-10-08 DIAGNOSIS — N186 End stage renal disease: Secondary | ICD-10-CM | POA: Diagnosis not present

## 2022-10-11 DIAGNOSIS — N186 End stage renal disease: Secondary | ICD-10-CM | POA: Diagnosis not present

## 2022-10-11 DIAGNOSIS — Z992 Dependence on renal dialysis: Secondary | ICD-10-CM | POA: Diagnosis not present

## 2022-10-11 DIAGNOSIS — N2581 Secondary hyperparathyroidism of renal origin: Secondary | ICD-10-CM | POA: Diagnosis not present

## 2022-10-15 DIAGNOSIS — N2581 Secondary hyperparathyroidism of renal origin: Secondary | ICD-10-CM | POA: Diagnosis not present

## 2022-10-15 DIAGNOSIS — N186 End stage renal disease: Secondary | ICD-10-CM | POA: Diagnosis not present

## 2022-10-15 DIAGNOSIS — Z992 Dependence on renal dialysis: Secondary | ICD-10-CM | POA: Diagnosis not present

## 2022-10-17 DIAGNOSIS — Z992 Dependence on renal dialysis: Secondary | ICD-10-CM | POA: Diagnosis not present

## 2022-10-17 DIAGNOSIS — N186 End stage renal disease: Secondary | ICD-10-CM | POA: Diagnosis not present

## 2022-10-17 DIAGNOSIS — N2581 Secondary hyperparathyroidism of renal origin: Secondary | ICD-10-CM | POA: Diagnosis not present

## 2022-10-21 DIAGNOSIS — N2581 Secondary hyperparathyroidism of renal origin: Secondary | ICD-10-CM | POA: Diagnosis not present

## 2022-10-21 DIAGNOSIS — Z992 Dependence on renal dialysis: Secondary | ICD-10-CM | POA: Diagnosis not present

## 2022-10-21 DIAGNOSIS — N186 End stage renal disease: Secondary | ICD-10-CM | POA: Diagnosis not present

## 2022-10-23 DIAGNOSIS — N186 End stage renal disease: Secondary | ICD-10-CM | POA: Diagnosis not present

## 2022-10-23 DIAGNOSIS — Z992 Dependence on renal dialysis: Secondary | ICD-10-CM | POA: Diagnosis not present

## 2022-10-23 DIAGNOSIS — N2581 Secondary hyperparathyroidism of renal origin: Secondary | ICD-10-CM | POA: Diagnosis not present

## 2022-10-29 DIAGNOSIS — Z992 Dependence on renal dialysis: Secondary | ICD-10-CM | POA: Diagnosis not present

## 2022-10-29 DIAGNOSIS — N186 End stage renal disease: Secondary | ICD-10-CM | POA: Diagnosis not present

## 2022-10-29 DIAGNOSIS — N2581 Secondary hyperparathyroidism of renal origin: Secondary | ICD-10-CM | POA: Diagnosis not present

## 2022-10-31 DIAGNOSIS — N2581 Secondary hyperparathyroidism of renal origin: Secondary | ICD-10-CM | POA: Diagnosis not present

## 2022-10-31 DIAGNOSIS — Z992 Dependence on renal dialysis: Secondary | ICD-10-CM | POA: Diagnosis not present

## 2022-10-31 DIAGNOSIS — N186 End stage renal disease: Secondary | ICD-10-CM | POA: Diagnosis not present

## 2022-11-05 DIAGNOSIS — N2581 Secondary hyperparathyroidism of renal origin: Secondary | ICD-10-CM | POA: Diagnosis not present

## 2022-11-05 DIAGNOSIS — Z992 Dependence on renal dialysis: Secondary | ICD-10-CM | POA: Diagnosis not present

## 2022-11-05 DIAGNOSIS — N186 End stage renal disease: Secondary | ICD-10-CM | POA: Diagnosis not present
